# Patient Record
Sex: Female | Born: 1940
Health system: Southern US, Community
[De-identification: ages and names within clinical notes are randomized; demographics above are authoritative.]

## PROBLEM LIST (undated history)

## (undated) DIAGNOSIS — G5622 Lesion of ulnar nerve, left upper limb: Secondary | ICD-10-CM

## (undated) DIAGNOSIS — D649 Anemia, unspecified: Secondary | ICD-10-CM

## (undated) DIAGNOSIS — I509 Heart failure, unspecified: Secondary | ICD-10-CM

## (undated) DIAGNOSIS — D689 Coagulation defect, unspecified: Secondary | ICD-10-CM

## (undated) DIAGNOSIS — E039 Hypothyroidism, unspecified: Secondary | ICD-10-CM

## (undated) DIAGNOSIS — T7840XA Allergy, unspecified, initial encounter: Secondary | ICD-10-CM

## (undated) DIAGNOSIS — M810 Age-related osteoporosis without current pathological fracture: Secondary | ICD-10-CM

## (undated) DIAGNOSIS — I739 Peripheral vascular disease, unspecified: Secondary | ICD-10-CM

## (undated) DIAGNOSIS — I714 Abdominal aortic aneurysm, without rupture, unspecified: Secondary | ICD-10-CM

## (undated) DIAGNOSIS — E119 Type 2 diabetes mellitus without complications: Secondary | ICD-10-CM

## (undated) DIAGNOSIS — M5136 Other intervertebral disc degeneration, lumbar region: Secondary | ICD-10-CM

## (undated) DIAGNOSIS — N183 Chronic kidney disease, stage 3 unspecified: Secondary | ICD-10-CM

## (undated) DIAGNOSIS — J439 Emphysema, unspecified: Secondary | ICD-10-CM

## (undated) DIAGNOSIS — I219 Acute myocardial infarction, unspecified: Secondary | ICD-10-CM

## (undated) DIAGNOSIS — N6019 Diffuse cystic mastopathy of unspecified breast: Secondary | ICD-10-CM

## (undated) DIAGNOSIS — I10 Essential (primary) hypertension: Secondary | ICD-10-CM

## (undated) DIAGNOSIS — C9000 Multiple myeloma not having achieved remission: Secondary | ICD-10-CM

## (undated) DIAGNOSIS — M1990 Unspecified osteoarthritis, unspecified site: Secondary | ICD-10-CM

## (undated) DIAGNOSIS — E785 Hyperlipidemia, unspecified: Secondary | ICD-10-CM

## (undated) DIAGNOSIS — Z5189 Encounter for other specified aftercare: Secondary | ICD-10-CM

## (undated) DIAGNOSIS — D759 Disease of blood and blood-forming organs, unspecified: Secondary | ICD-10-CM

## (undated) DIAGNOSIS — E079 Disorder of thyroid, unspecified: Secondary | ICD-10-CM

## (undated) DIAGNOSIS — IMO0001 Reserved for inherently not codable concepts without codable children: Secondary | ICD-10-CM

## (undated) DIAGNOSIS — H269 Unspecified cataract: Secondary | ICD-10-CM

## (undated) DIAGNOSIS — N289 Disorder of kidney and ureter, unspecified: Secondary | ICD-10-CM

## (undated) DIAGNOSIS — J449 Chronic obstructive pulmonary disease, unspecified: Secondary | ICD-10-CM

## (undated) DIAGNOSIS — R12 Heartburn: Secondary | ICD-10-CM

## (undated) DIAGNOSIS — M5126 Other intervertebral disc displacement, lumbar region: Secondary | ICD-10-CM

## (undated) DIAGNOSIS — G459 Transient cerebral ischemic attack, unspecified: Secondary | ICD-10-CM

## (undated) DIAGNOSIS — I639 Cerebral infarction, unspecified: Secondary | ICD-10-CM

## (undated) DIAGNOSIS — I2510 Atherosclerotic heart disease of native coronary artery without angina pectoris: Secondary | ICD-10-CM

## (undated) DIAGNOSIS — I83899 Varicose veins of unspecified lower extremities with other complications: Secondary | ICD-10-CM

## (undated) DIAGNOSIS — M51369 Other intervertebral disc degeneration, lumbar region without mention of lumbar back pain or lower extremity pain: Secondary | ICD-10-CM

## (undated) HISTORY — PX: SPINE SURGERY: SHX786

## (undated) HISTORY — PX: JOINT REPLACEMENT: SHX530

## (undated) HISTORY — PX: OTHER SURGICAL HISTORY: SHX169

## (undated) HISTORY — DX: Age-related osteoporosis without current pathological fracture: M81.0

## (undated) HISTORY — DX: Coagulation defect, unspecified: D68.9

## (undated) HISTORY — DX: Other intervertebral disc degeneration, lumbar region without mention of lumbar back pain or lower extremity pain: M51.369

## (undated) HISTORY — PX: CATARACT EXTRACTION, BILATERAL: SHX1313

## (undated) HISTORY — DX: Encounter for other specified aftercare: Z51.89

## (undated) HISTORY — DX: Unspecified osteoarthritis, unspecified site: M19.90

## (undated) HISTORY — DX: Peripheral vascular disease, unspecified: I73.9

## (undated) HISTORY — DX: Disorder of thyroid, unspecified: E07.9

## (undated) HISTORY — DX: Multiple myeloma not having achieved remission: C90.00

## (undated) HISTORY — PX: ULNAR NERVE REPAIR: SHX2594

## (undated) HISTORY — DX: Unspecified cataract: H26.9

## (undated) HISTORY — DX: Transient cerebral ischemic attack, unspecified: G45.9

## (undated) HISTORY — DX: Emphysema, unspecified: J43.9

## (undated) HISTORY — PX: ENDOSCOPIC VEIN LASER TREATMENT: SHX1508

## (undated) HISTORY — DX: Heart failure, unspecified: I50.9

## (undated) HISTORY — DX: Hyperlipidemia, unspecified: E78.5

## (undated) HISTORY — PX: THYROID SURGERY: SHX805

## (undated) HISTORY — PX: TONSILLECTOMY: SUR1361

## (undated) HISTORY — DX: Other intervertebral disc displacement, lumbar region: M51.26

## (undated) HISTORY — DX: Essential (primary) hypertension: I10

## (undated) HISTORY — PX: BREAST EXCISIONAL BIOPSY: SUR124

## (undated) HISTORY — DX: Other intervertebral disc degeneration, lumbar region: M51.36

## (undated) HISTORY — DX: Diffuse cystic mastopathy of unspecified breast: N60.19

---
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
## 1973-04-25 HISTORY — PX: BREAST SURGERY: SHX581

## 1978-04-25 DIAGNOSIS — T7840XA Allergy, unspecified, initial encounter: Secondary | ICD-10-CM

## 1978-04-25 HISTORY — DX: Allergy, unspecified, initial encounter: T78.40XA

## 1988-04-25 HISTORY — PX: OOPHORECTOMY: SHX86

## 1988-04-25 HISTORY — PX: ABDOMINAL HYSTERECTOMY: SHX81

## 1996-04-25 HISTORY — PX: COLONOSCOPY: SHX174

## 2008-04-25 HISTORY — PX: OTHER SURGICAL HISTORY: SHX169

## 2009-04-25 HISTORY — PX: BACK SURGERY: SHX140

## 2009-07-26 ENCOUNTER — Ambulatory Visit: Payer: Self-pay | Admitting: Orthopedic Surgery

## 2009-10-06 ENCOUNTER — Inpatient Hospital Stay (HOSPITAL_COMMUNITY): Admission: RE | Admit: 2009-10-06 | Discharge: 2009-10-21 | Payer: Self-pay | Admitting: Neurosurgery

## 2009-11-25 ENCOUNTER — Encounter: Admission: RE | Admit: 2009-11-25 | Discharge: 2009-11-25 | Payer: Self-pay | Admitting: Neurosurgery

## 2010-01-06 ENCOUNTER — Encounter: Admission: RE | Admit: 2010-01-06 | Discharge: 2010-01-06 | Payer: Self-pay | Admitting: Neurosurgery

## 2010-04-25 HISTORY — PX: KNEE SURGERY: SHX244

## 2010-07-11 LAB — URINALYSIS, ROUTINE W REFLEX MICROSCOPIC
Bilirubin Urine: NEGATIVE
Glucose, UA: NEGATIVE mg/dL
Hgb urine dipstick: NEGATIVE
Ketones, ur: NEGATIVE mg/dL
Nitrite: NEGATIVE
Protein, ur: NEGATIVE mg/dL
Specific Gravity, Urine: 1.015 (ref 1.005–1.030)
Urobilinogen, UA: 0.2 mg/dL (ref 0.0–1.0)
pH: 6 (ref 5.0–8.0)

## 2010-07-11 LAB — BASIC METABOLIC PANEL
BUN: 12 mg/dL (ref 6–23)
BUN: 8 mg/dL (ref 6–23)
CO2: 28 mEq/L (ref 19–32)
CO2: 31 mEq/L (ref 19–32)
Calcium: 8.2 mg/dL — ABNORMAL LOW (ref 8.4–10.5)
Calcium: 8.7 mg/dL (ref 8.4–10.5)
Chloride: 100 mEq/L (ref 96–112)
Chloride: 97 mEq/L (ref 96–112)
Creatinine, Ser: 0.79 mg/dL (ref 0.4–1.2)
Creatinine, Ser: 0.85 mg/dL (ref 0.4–1.2)
GFR calc Af Amer: >60 mL/min (ref >60)
GFR calc Af Amer: >60 mL/min (ref >60)
GFR calc non Af Amer: >60 mL/min (ref >60)
GFR calc non Af Amer: >60 mL/min (ref >60)
Glucose, Bld: 112 mg/dL — ABNORMAL HIGH (ref 70–99)
Glucose, Bld: 121 mg/dL — ABNORMAL HIGH (ref 70–99)
Potassium: 3.1 mEq/L — ABNORMAL LOW (ref 3.5–5.1)
Potassium: 3.6 mEq/L (ref 3.5–5.1)
Sodium: 136 mEq/L (ref 135–145)
Sodium: 136 mEq/L (ref 135–145)

## 2010-07-11 LAB — URINE CULTURE
Colony Count: NO GROWTH
Culture: NO GROWTH

## 2010-07-11 LAB — VANCOMYCIN, TROUGH: Vancomycin Tr: 21.9 ug/mL — ABNORMAL HIGH (ref 10.0–20.0)

## 2010-07-12 LAB — TYPE AND SCREEN
ABO/RH(D): A POS
Antibody Screen: NEGATIVE

## 2010-07-12 LAB — CBC
HCT: 37.8 % (ref 36.0–46.0)
Hemoglobin: 12.9 g/dL (ref 12.0–15.0)
MCHC: 34.1 g/dL (ref 30.0–36.0)
MCV: 99.5 fL (ref 78.0–100.0)
Platelets: 308 10*3/uL (ref 150–400)
RBC: 3.8 MIL/uL — ABNORMAL LOW (ref 3.87–5.11)
RDW: 13.4 % (ref 11.5–15.5)
WBC: 10.9 10*3/uL — ABNORMAL HIGH (ref 4.0–10.5)

## 2010-07-12 LAB — ABO/RH: ABO/RH(D): A POS

## 2010-07-12 LAB — SURGICAL PCR SCREEN
MRSA, PCR: NEGATIVE
Staphylococcus aureus: NEGATIVE

## 2010-09-27 ENCOUNTER — Ambulatory Visit: Payer: Self-pay | Admitting: Orthopedic Surgery

## 2010-10-12 ENCOUNTER — Inpatient Hospital Stay: Payer: Self-pay | Admitting: Orthopedic Surgery

## 2010-10-18 ENCOUNTER — Encounter: Payer: Self-pay | Admitting: Internal Medicine

## 2010-10-24 ENCOUNTER — Encounter: Payer: Self-pay | Admitting: Internal Medicine

## 2010-11-24 ENCOUNTER — Encounter: Payer: Self-pay | Admitting: Internal Medicine

## 2010-11-30 ENCOUNTER — Encounter: Payer: Self-pay | Admitting: Orthopedic Surgery

## 2010-12-25 ENCOUNTER — Encounter: Payer: Self-pay | Admitting: Orthopedic Surgery

## 2011-05-02 DIAGNOSIS — E785 Hyperlipidemia, unspecified: Secondary | ICD-10-CM | POA: Diagnosis not present

## 2011-05-02 DIAGNOSIS — I10 Essential (primary) hypertension: Secondary | ICD-10-CM | POA: Diagnosis not present

## 2011-05-02 DIAGNOSIS — E039 Hypothyroidism, unspecified: Secondary | ICD-10-CM | POA: Diagnosis not present

## 2011-05-25 DIAGNOSIS — M1710 Unilateral primary osteoarthritis, unspecified knee: Secondary | ICD-10-CM | POA: Diagnosis not present

## 2011-08-01 DIAGNOSIS — E785 Hyperlipidemia, unspecified: Secondary | ICD-10-CM | POA: Diagnosis not present

## 2011-08-01 DIAGNOSIS — E039 Hypothyroidism, unspecified: Secondary | ICD-10-CM | POA: Diagnosis not present

## 2011-08-23 DIAGNOSIS — M1710 Unilateral primary osteoarthritis, unspecified knee: Secondary | ICD-10-CM | POA: Diagnosis not present

## 2011-09-26 DIAGNOSIS — Z0000 Encounter for general adult medical examination without abnormal findings: Secondary | ICD-10-CM | POA: Diagnosis not present

## 2011-09-26 DIAGNOSIS — Z1331 Encounter for screening for depression: Secondary | ICD-10-CM | POA: Diagnosis not present

## 2011-09-26 DIAGNOSIS — E785 Hyperlipidemia, unspecified: Secondary | ICD-10-CM | POA: Diagnosis not present

## 2011-09-26 DIAGNOSIS — Z1289 Encounter for screening for malignant neoplasm of other sites: Secondary | ICD-10-CM | POA: Diagnosis not present

## 2011-09-26 DIAGNOSIS — E039 Hypothyroidism, unspecified: Secondary | ICD-10-CM | POA: Diagnosis not present

## 2011-09-26 DIAGNOSIS — I10 Essential (primary) hypertension: Secondary | ICD-10-CM | POA: Diagnosis not present

## 2011-12-28 DIAGNOSIS — H2510 Age-related nuclear cataract, unspecified eye: Secondary | ICD-10-CM | POA: Diagnosis not present

## 2012-04-05 DIAGNOSIS — Z1231 Encounter for screening mammogram for malignant neoplasm of breast: Secondary | ICD-10-CM | POA: Diagnosis not present

## 2012-04-05 DIAGNOSIS — R922 Inconclusive mammogram: Secondary | ICD-10-CM | POA: Diagnosis not present

## 2012-04-11 DIAGNOSIS — I83893 Varicose veins of bilateral lower extremities with other complications: Secondary | ICD-10-CM | POA: Diagnosis not present

## 2012-04-11 DIAGNOSIS — N6019 Diffuse cystic mastopathy of unspecified breast: Secondary | ICD-10-CM | POA: Diagnosis not present

## 2012-06-21 DIAGNOSIS — H905 Unspecified sensorineural hearing loss: Secondary | ICD-10-CM | POA: Diagnosis not present

## 2012-09-19 DIAGNOSIS — M1710 Unilateral primary osteoarthritis, unspecified knee: Secondary | ICD-10-CM | POA: Diagnosis not present

## 2012-10-01 DIAGNOSIS — E785 Hyperlipidemia, unspecified: Secondary | ICD-10-CM | POA: Diagnosis not present

## 2012-10-01 DIAGNOSIS — E039 Hypothyroidism, unspecified: Secondary | ICD-10-CM | POA: Diagnosis not present

## 2012-10-01 DIAGNOSIS — Z0000 Encounter for general adult medical examination without abnormal findings: Secondary | ICD-10-CM | POA: Diagnosis not present

## 2012-10-01 DIAGNOSIS — I10 Essential (primary) hypertension: Secondary | ICD-10-CM | POA: Diagnosis not present

## 2012-10-17 ENCOUNTER — Encounter: Payer: Self-pay | Admitting: *Deleted

## 2013-01-02 DIAGNOSIS — H2510 Age-related nuclear cataract, unspecified eye: Secondary | ICD-10-CM | POA: Diagnosis not present

## 2013-04-09 DIAGNOSIS — R928 Other abnormal and inconclusive findings on diagnostic imaging of breast: Secondary | ICD-10-CM | POA: Diagnosis not present

## 2013-04-09 DIAGNOSIS — Z1231 Encounter for screening mammogram for malignant neoplasm of breast: Secondary | ICD-10-CM | POA: Diagnosis not present

## 2013-04-23 ENCOUNTER — Ambulatory Visit: Payer: Self-pay | Admitting: General Surgery

## 2013-04-29 DIAGNOSIS — S8010XA Contusion of unspecified lower leg, initial encounter: Secondary | ICD-10-CM | POA: Diagnosis not present

## 2013-05-01 ENCOUNTER — Encounter: Payer: Self-pay | Admitting: General Surgery

## 2013-05-01 ENCOUNTER — Ambulatory Visit (INDEPENDENT_AMBULATORY_CARE_PROVIDER_SITE_OTHER): Payer: Medicare Other | Admitting: General Surgery

## 2013-05-01 VITALS — BP 144/84 | HR 82 | Resp 14 | Ht 65.5 in | Wt 197.0 lb

## 2013-05-01 DIAGNOSIS — N6019 Diffuse cystic mastopathy of unspecified breast: Secondary | ICD-10-CM

## 2013-05-01 NOTE — Patient Instructions (Signed)
Follow up in one year with bilateral screening mammogram and office visit. Continue self breast exams. Call office for any new breast issues or concerns.

## 2013-05-01 NOTE — Progress Notes (Signed)
Patient ID: [name], female   DOB: Feb 12, 1941, 73 y.o.   MRN: [id number]  Chief Complaint  Patient presents with  . Follow-up    mammogram    HPI Bailey Ayala is a 73 y.o. female.  who presents for her annual follow up breast evaluation. The most recent mammogram was done on 04-09-13.  Patient does perform regular self breast checks and gets regular mammograms done.  No new breast complaints.    HPI  Past Medical History  Diagnosis Date  . Arthritis   . Diffuse cystic mastopathy   . Thyroid disease     Past Surgical History  Procedure Laterality Date  . Abdominal hysterectomy    . Tonsillectomy    . Vein closure procedure Left 2010  . Colonoscopy  1998  . Knee surgery Left 2012  . Back surgery  2011    Family History  Problem Relation Age of Onset  . Heart disease Mother     MI  . Heart disease Father     MI    Social History History  Substance Use Topics  . Smoking status: Former Smoker    Quit date: 04/25/1988  . Smokeless tobacco: Never Used  . Alcohol Use: Yes     Comment: occasionally    Allergies  Allergen Reactions  . Codeine     hallucinations      Current Outpatient Prescriptions  Medication Sig Dispense Refill  . ibuprofen (ADVIL,MOTRIN) 200 MG tablet Take 200 mg by mouth every 6 (six) hours as needed.      . Multiple Vitamins-Minerals (MULTI VITAMIN/MINERALS PO) Take by mouth daily.      Marland Kitchen SYNTHROID 112 MCG tablet Take 112 mcg by mouth daily before breakfast.        No current facility-administered medications for this visit.    Review of Systems Review of Systems  Constitutional: Negative.   Respiratory: Negative.   Cardiovascular: Negative.     Blood pressure 144/84, pulse 82, resp. rate 14, height 5' 5.5" (1.664 m), weight 197 lb (89.359 kg).  Physical Exam Physical Exam  Constitutional: She is oriented to person, place, and time. She appears well-developed and well-nourished.  Eyes: Conjunctivae are normal. No  scleral icterus.  Neck: Neck supple.  Cardiovascular: Normal rate, regular rhythm and normal heart sounds.   Pulmonary/Chest: Effort normal and breath sounds normal. Right breast exhibits no inverted nipple, no mass, no nipple discharge, no skin change and no tenderness. Left breast exhibits no inverted nipple, no mass, no nipple discharge, no skin change and no tenderness.  Lymphadenopathy:    She has no cervical adenopathy.    She has no axillary adenopathy.  Neurological: She is alert and oriented to person, place, and time.  Skin: Skin is warm and dry.    Data Reviewed Mammogram reviewed and stable.  Assessment    Stable physical exam.    Plan    Follow up in one year with bilateral screening mammogram and office visit.       Chukwudi Ewen G 05/01/2013, 2:27 PM

## 2013-10-28 DIAGNOSIS — Z0000 Encounter for general adult medical examination without abnormal findings: Secondary | ICD-10-CM | POA: Diagnosis not present

## 2013-10-28 DIAGNOSIS — I10 Essential (primary) hypertension: Secondary | ICD-10-CM | POA: Diagnosis not present

## 2013-10-28 DIAGNOSIS — E785 Hyperlipidemia, unspecified: Secondary | ICD-10-CM | POA: Diagnosis not present

## 2013-10-28 DIAGNOSIS — E039 Hypothyroidism, unspecified: Secondary | ICD-10-CM | POA: Diagnosis not present

## 2014-01-02 ENCOUNTER — Encounter: Payer: Self-pay | Admitting: General Surgery

## 2014-02-20 DIAGNOSIS — H2511 Age-related nuclear cataract, right eye: Secondary | ICD-10-CM | POA: Diagnosis not present

## 2014-02-24 ENCOUNTER — Encounter: Payer: Self-pay | Admitting: General Surgery

## 2014-04-25 DIAGNOSIS — G459 Transient cerebral ischemic attack, unspecified: Secondary | ICD-10-CM

## 2014-04-25 HISTORY — DX: Transient cerebral ischemic attack, unspecified: G45.9

## 2014-04-30 ENCOUNTER — Ambulatory Visit: Payer: Self-pay | Admitting: Ophthalmology

## 2014-04-30 DIAGNOSIS — I499 Cardiac arrhythmia, unspecified: Secondary | ICD-10-CM | POA: Diagnosis not present

## 2014-04-30 DIAGNOSIS — H2511 Age-related nuclear cataract, right eye: Secondary | ICD-10-CM | POA: Diagnosis not present

## 2014-04-30 DIAGNOSIS — Z01810 Encounter for preprocedural cardiovascular examination: Secondary | ICD-10-CM | POA: Diagnosis not present

## 2014-05-06 ENCOUNTER — Ambulatory Visit: Payer: Self-pay | Admitting: Ophthalmology

## 2014-05-06 DIAGNOSIS — R600 Localized edema: Secondary | ICD-10-CM | POA: Diagnosis not present

## 2014-05-06 DIAGNOSIS — H2511 Age-related nuclear cataract, right eye: Secondary | ICD-10-CM | POA: Diagnosis not present

## 2014-05-06 DIAGNOSIS — R002 Palpitations: Secondary | ICD-10-CM | POA: Diagnosis not present

## 2014-05-06 DIAGNOSIS — D649 Anemia, unspecified: Secondary | ICD-10-CM | POA: Diagnosis not present

## 2014-05-06 DIAGNOSIS — J449 Chronic obstructive pulmonary disease, unspecified: Secondary | ICD-10-CM | POA: Diagnosis not present

## 2014-05-06 DIAGNOSIS — M069 Rheumatoid arthritis, unspecified: Secondary | ICD-10-CM | POA: Diagnosis not present

## 2014-05-06 DIAGNOSIS — E039 Hypothyroidism, unspecified: Secondary | ICD-10-CM | POA: Diagnosis not present

## 2014-05-06 DIAGNOSIS — I10 Essential (primary) hypertension: Secondary | ICD-10-CM | POA: Diagnosis not present

## 2014-05-06 DIAGNOSIS — Z888 Allergy status to other drugs, medicaments and biological substances status: Secondary | ICD-10-CM | POA: Diagnosis not present

## 2014-05-06 HISTORY — PX: EYE SURGERY: SHX253

## 2014-05-13 ENCOUNTER — Ambulatory Visit: Payer: No Typology Code available for payment source | Admitting: General Surgery

## 2014-06-16 ENCOUNTER — Ambulatory Visit: Payer: Medicare Other | Admitting: General Surgery

## 2014-06-17 ENCOUNTER — Ambulatory Visit: Payer: Self-pay | Admitting: Ophthalmology

## 2014-06-17 DIAGNOSIS — Z9849 Cataract extraction status, unspecified eye: Secondary | ICD-10-CM | POA: Diagnosis not present

## 2014-06-17 DIAGNOSIS — H2512 Age-related nuclear cataract, left eye: Secondary | ICD-10-CM | POA: Diagnosis not present

## 2014-06-17 DIAGNOSIS — Z96652 Presence of left artificial knee joint: Secondary | ICD-10-CM | POA: Diagnosis not present

## 2014-06-17 DIAGNOSIS — M1990 Unspecified osteoarthritis, unspecified site: Secondary | ICD-10-CM | POA: Diagnosis not present

## 2014-06-17 DIAGNOSIS — E669 Obesity, unspecified: Secondary | ICD-10-CM | POA: Diagnosis not present

## 2014-06-17 DIAGNOSIS — I10 Essential (primary) hypertension: Secondary | ICD-10-CM | POA: Diagnosis not present

## 2014-06-17 DIAGNOSIS — H269 Unspecified cataract: Secondary | ICD-10-CM | POA: Diagnosis not present

## 2014-06-17 DIAGNOSIS — Z9889 Other specified postprocedural states: Secondary | ICD-10-CM | POA: Diagnosis not present

## 2014-06-17 DIAGNOSIS — Z885 Allergy status to narcotic agent status: Secondary | ICD-10-CM | POA: Diagnosis not present

## 2014-06-17 DIAGNOSIS — Z79899 Other long term (current) drug therapy: Secondary | ICD-10-CM | POA: Diagnosis not present

## 2014-06-17 DIAGNOSIS — Z791 Long term (current) use of non-steroidal anti-inflammatories (NSAID): Secondary | ICD-10-CM | POA: Diagnosis not present

## 2014-07-09 ENCOUNTER — Encounter: Payer: Self-pay | Admitting: General Surgery

## 2014-07-09 DIAGNOSIS — Z1231 Encounter for screening mammogram for malignant neoplasm of breast: Secondary | ICD-10-CM | POA: Diagnosis not present

## 2014-07-16 ENCOUNTER — Ambulatory Visit (INDEPENDENT_AMBULATORY_CARE_PROVIDER_SITE_OTHER): Payer: Medicare Other | Admitting: General Surgery

## 2014-07-16 ENCOUNTER — Ambulatory Visit: Payer: Medicare Other | Admitting: General Surgery

## 2014-07-16 ENCOUNTER — Encounter: Payer: Self-pay | Admitting: General Surgery

## 2014-07-16 VITALS — BP 124/76 | HR 74 | Resp 14 | Ht 65.0 in | Wt 197.0 lb

## 2014-07-16 DIAGNOSIS — N6019 Diffuse cystic mastopathy of unspecified breast: Secondary | ICD-10-CM

## 2014-07-16 DIAGNOSIS — I872 Venous insufficiency (chronic) (peripheral): Secondary | ICD-10-CM

## 2014-07-16 NOTE — Progress Notes (Signed)
Patient ID: Bailey Ayala, female   DOB: 31-Jul-1940, 74 y.o.   MRN: [id number]  Chief Complaint  Patient presents with  . Follow-up    mammogram    HPI Bailey Ayala is a 74 y.o. female who presents for a breast evaluation. The most recent mammogram was done on 07/09/14 .  Patient also states that she has been having pain in her legs, mostly the right leg.  It is worse in evening with heaviness and cramping. She had prior ablation done on left leg Patient does perform regular self breast checks and gets regular mammograms done.    HPI  Past Medical History  Diagnosis Date  . Arthritis   . Diffuse cystic mastopathy   . Thyroid disease     Past Surgical History  Procedure Laterality Date  . Abdominal hysterectomy    . Tonsillectomy    . Vein closure procedure Left 2010  . Colonoscopy  1998  . Knee surgery Left 2012  . Back surgery  2011  . Eye surgery Bilateral 05/06/14    Family History  Problem Relation Age of Onset  . Heart disease Mother     MI  . Heart disease Father     MI    Social History History  Substance Use Topics  . Smoking status: Former Smoker    Quit date: 04/25/1988  . Smokeless tobacco: Never Used  . Alcohol Use: Yes     Comment: occasionally    Allergies  Allergen Reactions  . Codeine     hallucinations      Current Outpatient Prescriptions  Medication Sig Dispense Refill  . ibuprofen (ADVIL,MOTRIN) 200 MG tablet Take 200 mg by mouth every 6 (six) hours as needed.    . Multiple Vitamins-Minerals (MULTI VITAMIN/MINERALS PO) Take by mouth daily.    Marland Kitchen SYNTHROID 112 MCG tablet Take 112 mcg by mouth daily before breakfast.      No current facility-administered medications for this visit.    Review of Systems Review of Systems  Constitutional: Negative.   Respiratory: Negative.   Cardiovascular: Negative.     Blood pressure 124/76, pulse 74, resp. rate 14, height 5\' 5"  (1.651 m), weight 197 lb (89.359 kg).  Physical  Exam Physical Exam  Constitutional: She appears well-developed and well-nourished.  Eyes: Conjunctivae are normal. No scleral icterus.  Neck: Neck supple.  Cardiovascular: Normal rate, regular rhythm and normal heart sounds.   Pulses:      Dorsalis pedis pulses are 1+ on the right side, and 1+ on the left side.       Posterior tibial pulses are 0 on the right side, and 0 on the left side.  1+ edema on both legs. Feet are warm. Capillary refill is brisk.  Pulmonary/Chest: Effort normal and breath sounds normal. Right breast exhibits no inverted nipple, no mass, no nipple discharge, no skin change and no tenderness. Left breast exhibits no inverted nipple, no mass, no nipple discharge, no skin change and no tenderness.  Abdominal: Soft. Normal appearance and bowel sounds are normal. There is no tenderness.  Lymphadenopathy:    She has no cervical adenopathy.    She has no axillary adenopathy.  Skin: Skin is warm and dry.    Data Reviewed Mammogram reviewed-stable.  Assessment    Fibrocystic breast disease. Venous insufficiency.    Plan    The patient has been asked to return to the office in one year with a bilateral screening mammogram. Patient will return for bilateral  duplex ultrasound of the legs and ABI. Patient was advised to wear the stockings in the mean time.       Mahkai Fangman G 07/16/2014, 11:21 AM

## 2014-07-22 ENCOUNTER — Ambulatory Visit: Payer: Self-pay | Admitting: Ophthalmology

## 2014-07-22 DIAGNOSIS — H5711 Ocular pain, right eye: Secondary | ICD-10-CM | POA: Diagnosis not present

## 2014-07-22 DIAGNOSIS — R930 Abnormal findings on diagnostic imaging of skull and head, not elsewhere classified: Secondary | ICD-10-CM | POA: Diagnosis not present

## 2014-07-22 DIAGNOSIS — I868 Varicose veins of other specified sites: Secondary | ICD-10-CM | POA: Diagnosis not present

## 2014-07-23 DIAGNOSIS — Z96652 Presence of left artificial knee joint: Secondary | ICD-10-CM | POA: Diagnosis not present

## 2014-07-23 DIAGNOSIS — M1711 Unilateral primary osteoarthritis, right knee: Secondary | ICD-10-CM | POA: Diagnosis not present

## 2014-07-30 ENCOUNTER — Ambulatory Visit (INDEPENDENT_AMBULATORY_CARE_PROVIDER_SITE_OTHER): Payer: Medicare Other | Admitting: General Surgery

## 2014-07-30 ENCOUNTER — Ambulatory Visit: Payer: No Typology Code available for payment source

## 2014-07-30 ENCOUNTER — Encounter: Payer: Self-pay | Admitting: General Surgery

## 2014-07-30 VITALS — BP 168/86 | HR 80 | Resp 14 | Ht 65.0 in | Wt 198.0 lb

## 2014-07-30 DIAGNOSIS — I872 Venous insufficiency (chronic) (peripheral): Secondary | ICD-10-CM

## 2014-07-30 NOTE — Progress Notes (Signed)
This is a 74 year old female here today for ABI's and right leg ultrasound. ABI shows monophasic flow in right DP/PT and sluggish flow in left foot. Index is 0.97 on right and 0.52 on left. Inspite of the low index on left pt describes no left leg symptoms.  Duplex study right leg  Venous- deep syste with abnormality. Lateral branch of GSV is abnormal with reflux of over 0.38secs. VV seem to connect with this vein  Pt advised on findings. Her main symptom appears tpo be increasing tired feeling in right leg as day progresses. Trial of compression hose for 2 mos. Reassess then. If not improved will discuss ablation of the lateral GSV

## 2014-08-24 NOTE — Op Note (Signed)
PATIENT NAME:  Bailey Ayala, Bailey Ayala MR#:  [id number] DATE OF BIRTH:  January 24, 1941  DATE OF PROCEDURE:  06/17/2014  PREOPERATIVE DIAGNOSIS:  Nuclear sclerotic cataract of the left eye.   POSTOPERATIVE DIAGNOSIS:  Nuclear sclerotic cataract of the left eye.   OPERATIVE PROCEDURE:  Cataract extraction by phacoemulsification with implant of intraocular lens to left eye.   SURGEON:  Birder Robson, MD.   ANESTHESIA:  1. Managed anesthesia care.  2. Topical tetracaine drops followed by 2% Xylocaine jelly applied in the preoperative holding area.   COMPLICATIONS:  None.   TECHNIQUE:   Stop and chop.  DESCRIPTION OF PROCEDURE:  The patient was examined and consented in the preoperative holding area where the aforementioned topical anesthesia was applied to the left eye and then brought back to the Operating Room where the left eye was prepped and draped in the usual sterile ophthalmic fashion and a lid speculum was placed. A paracentesis was created with the side port blade and the anterior chamber was filled with viscoelastic. A near clear corneal incision was performed with the steel keratome. A continuous curvilinear capsulorrhexis was performed with a cystotome followed by the capsulorrhexis forceps. Hydrodissection and hydrodelineation were carried out with BSS on a blunt cannula. The lens was removed in a stop and chop technique and the remaining cortical material was removed with the irrigation-aspiration handpiece. The capsular bag was inflated with viscoelastic and the Alcon SN60WF Tecnis ZCB00 22.0-diopter lens, serial number [id number] was placed in the capsular bag without complication. The remaining viscoelastic was removed from the eye with the irrigation-aspiration handpiece. The wounds were hydrated. The anterior chamber was flushed with Miostat and the eye was inflated to physiologic pressure. 0.1 mL of cefuroxime concentration 10 mg/mL was placed in the anterior chamber. The wounds were  found to be water tight. The eye was dressed with Vigamox. The patient was given protective glasses to wear throughout the day and a shield with which to sleep tonight. The patient was also given drops with which to begin a drop regimen today and will follow-up with me in one day.    ____________________________ Mcnerney Diones. Avagrace Botelho, MD wlp:mw D: 06/17/2014 20:08:09 ET T: 06/18/2014 05:23:09 ET JOB#: [id number]  cc: Raquell Richer L. Pauleen Goleman, MD, <Dictator> Sannes Diones Talulah Schirmer MD ELECTRONICALLY SIGNED 06/19/2014 10:31

## 2014-08-24 NOTE — Op Note (Signed)
PATIENT NAME:  Bailey Ayala, Bailey Ayala MR#:  [id number] DATE OF BIRTH:  Feb 09, 1941  DATE OF PROCEDURE:  05/06/2014  PREOPERATIVE DIAGNOSIS:  Nuclear sclerotic cataract of the right eye.   POSTOPERATIVE DIAGNOSIS:  Nuclear sclerotic cataract of the right eye.   OPERATIVE PROCEDURE:  Cataract extraction by phacoemulsification with implant of intraocular lens to right eye.   SURGEON:  Birder Robson, MD   ANESTHESIA:  1. Managed anesthesia care.  2. Topical tetracaine drops followed by 2% Xylocaine jelly applied in the preoperative holding area.   COMPLICATIONS:  None.   TECHNIQUE:   Stop and chop  DESCRIPTION OF PROCEDURE:  The patient was examined and consented in the preoperative holding area where the aforementioned topical anesthesia was applied to the right eye and then brought back to the operating room where the right eye was prepped and draped in the usual sterile ophthalmic fashion and a lid speculum was placed. A paracentesis was created with the side port blade and the anterior chamber was filled with viscoelastic. A near clear corneal incision was performed with the steel keratome. A continuous curvilinear capsulorrhexis was performed with a cystotome followed by the capsulorrhexis forceps. Hydrodissection and hydrodelineation were carried out with BSS on a blunt cannula. The lens was removed in a stop and chop technique and the remaining cortical material was removed with the irrigation-aspiration handpiece. The capsular bag was inflated with viscoelastic and the Tecnis ZCB00 22.0-diopter lens, serial number [id number] was placed in the capsular bag without complication. The remaining viscoelastic was removed from the eye with the irrigation-aspiration handpiece. The wounds were hydrated. The anterior chamber was flushed with Miostat and the eye was inflated to physiologic pressure. 0.1 mL of cefuroxime concentration 10 mg/mL was placed in the anterior chamber. The wounds were found to be  water tight. The eye was dressed with Vigamox. The patient was given protective glasses to wear throughout the day and a shield with which to sleep tonight. The patient was also given drops with which to begin a drop regimen today and will follow-up with me in one day.    ____________________________ Husk Diones. Harshitha Fretz, MD wlp:bm D: 05/06/2014 21:05:35 ET T: 05/07/2014 03:58:55 ET JOB#: [id number]  cc: Brynlei Klausner L. Marquet Faircloth, MD, <Dictator> Adelstein Diones Karsyn Jamie MD ELECTRONICALLY SIGNED 05/07/2014 11:19

## 2014-09-15 ENCOUNTER — Encounter: Payer: Self-pay | Admitting: General Surgery

## 2014-09-30 ENCOUNTER — Encounter: Payer: Self-pay | Admitting: General Surgery

## 2014-09-30 ENCOUNTER — Ambulatory Visit (INDEPENDENT_AMBULATORY_CARE_PROVIDER_SITE_OTHER): Payer: Medicare Other | Admitting: General Surgery

## 2014-09-30 VITALS — BP 140/72 | HR 78 | Resp 13 | Ht 65.0 in | Wt 198.0 lb

## 2014-09-30 DIAGNOSIS — I83013 Varicose veins of right lower extremity with ulcer of ankle: Secondary | ICD-10-CM

## 2014-09-30 DIAGNOSIS — I83012 Varicose veins of right lower extremity with ulcer of calf: Secondary | ICD-10-CM

## 2014-09-30 DIAGNOSIS — I83011 Varicose veins of right lower extremity with ulcer of thigh: Secondary | ICD-10-CM | POA: Diagnosis not present

## 2014-09-30 DIAGNOSIS — I83019 Varicose veins of right lower extremity with ulcer of unspecified site: Secondary | ICD-10-CM

## 2014-09-30 DIAGNOSIS — I83015 Varicose veins of right lower extremity with ulcer other part of foot: Secondary | ICD-10-CM

## 2014-09-30 DIAGNOSIS — I8390 Asymptomatic varicose veins of unspecified lower extremity: Secondary | ICD-10-CM

## 2014-09-30 DIAGNOSIS — I83014 Varicose veins of right lower extremity with ulcer of heel and midfoot: Secondary | ICD-10-CM

## 2014-09-30 DIAGNOSIS — L97919 Non-pressure chronic ulcer of unspecified part of right lower leg with unspecified severity: Secondary | ICD-10-CM

## 2014-09-30 DIAGNOSIS — I779 Disorder of arteries and arterioles, unspecified: Secondary | ICD-10-CM | POA: Diagnosis not present

## 2014-09-30 DIAGNOSIS — I8393 Asymptomatic varicose veins of bilateral lower extremities: Secondary | ICD-10-CM

## 2014-09-30 DIAGNOSIS — I83018 Varicose veins of right lower extremity with ulcer other part of lower leg: Secondary | ICD-10-CM

## 2014-09-30 NOTE — Progress Notes (Signed)
Patient ID: Bailey Ayala, female   DOB: [birth year], 74 y.o.   MRN: [id number]  Patient here for a 2 month follow up Varicose veins and PAD. States she is doing much better, does report minimal swelling by the end of day. Patient does not wear compression hose states hard to get on.  States that her symptoms are minimal and not incapacitating at this time. On the right leg, she experiences progressive discomfort through the day. On the left leg, she has claudication around a quarter of a mile on level ground and shorter distance going up hill.   Exam: Scant edema in both legs. Varicose veins on the right, as noted before.  Peripheral pulses are hard to feel bilaterally. Feet are warm and capillary refill is <2 seconds.   Impression: varicose vein on the right leg, bilateral arterial occlusive disease-left worse than right.   Symptoms are mild at this time, but this needs frequent follow up. Encouraged use of compression hose on right leg.   Follow up: 2 months with ABI

## 2014-11-03 DIAGNOSIS — E785 Hyperlipidemia, unspecified: Secondary | ICD-10-CM | POA: Insufficient documentation

## 2014-11-03 DIAGNOSIS — I10 Essential (primary) hypertension: Secondary | ICD-10-CM | POA: Insufficient documentation

## 2014-11-04 ENCOUNTER — Other Ambulatory Visit: Payer: Self-pay

## 2014-11-04 ENCOUNTER — Ambulatory Visit (INDEPENDENT_AMBULATORY_CARE_PROVIDER_SITE_OTHER): Payer: Medicare Other | Admitting: Family Medicine

## 2014-11-04 ENCOUNTER — Encounter: Payer: Self-pay | Admitting: Family Medicine

## 2014-11-04 ENCOUNTER — Other Ambulatory Visit: Payer: Medicare Other

## 2014-11-04 VITALS — BP 134/78 | HR 73 | Temp 97.8°F | Ht 64.8 in | Wt 195.0 lb

## 2014-11-04 DIAGNOSIS — E785 Hyperlipidemia, unspecified: Secondary | ICD-10-CM

## 2014-11-04 DIAGNOSIS — N182 Chronic kidney disease, stage 2 (mild): Secondary | ICD-10-CM | POA: Diagnosis not present

## 2014-11-04 DIAGNOSIS — E039 Hypothyroidism, unspecified: Secondary | ICD-10-CM

## 2014-11-04 DIAGNOSIS — I779 Disorder of arteries and arterioles, unspecified: Secondary | ICD-10-CM | POA: Diagnosis not present

## 2014-11-04 DIAGNOSIS — I10 Essential (primary) hypertension: Secondary | ICD-10-CM

## 2014-11-04 DIAGNOSIS — Z0000 Encounter for general adult medical examination without abnormal findings: Secondary | ICD-10-CM | POA: Diagnosis not present

## 2014-11-04 LAB — CBC WITH DIFFERENTIAL/PLATELET
Hematocrit: 38.4 % (ref 34.0–46.6)
Hemoglobin: 12.6 g/dL (ref 11.1–15.9)
Lymphocytes Absolute: 1.6 10*3/uL (ref 0.7–3.1)
Lymphs: 28 %
MCH: 33.5 pg — ABNORMAL HIGH (ref 26.6–33.0)
MCHC: 32.8 g/dL (ref 31.5–35.7)
MCV: 102 fL — ABNORMAL HIGH (ref 79–97)
MID (Absolute): 0.8 10*3/uL (ref 0.1–1.6)
MID: 14 %
Neutrophils Absolute: 3.5 10*3/uL (ref 1.4–7.0)
Neutrophils: 58 %
Platelets: 179 10*3/uL (ref 150–379)
RBC: 3.76 x10E6/uL — ABNORMAL LOW (ref 3.77–5.28)
RDW: 13.6 % (ref 12.3–15.4)
WBC: 5.9 10*3/uL (ref 3.4–10.8)

## 2014-11-04 LAB — URINALYSIS, ROUTINE W REFLEX MICROSCOPIC
Bilirubin, UA: NEGATIVE
Glucose, UA: NEGATIVE
Ketones, UA: NEGATIVE
Leukocytes, UA: NEGATIVE
Nitrite, UA: NEGATIVE
Protein, UA: NEGATIVE
RBC, UA: NEGATIVE
Specific Gravity, UA: 1.02 (ref 1.005–1.030)
Urobilinogen, Ur: 0.2 mg/dL (ref 0.2–1.0)
pH, UA: 5.5 (ref 5.0–7.5)

## 2014-11-04 LAB — LIPID PANEL PICCOLO, WAIVED
Chol/HDL Ratio Piccolo,Waive: 4.8 mg/dL
Cholesterol Piccolo, Waived: 221 mg/dL — ABNORMAL HIGH (ref <200)
HDL Chol Piccolo, Waived: 46 mg/dL — ABNORMAL LOW (ref >59)
LDL Chol Calc Piccolo Waived: 124 mg/dL — ABNORMAL HIGH (ref <100)
Triglycerides Piccolo,Waived: 254 mg/dL — ABNORMAL HIGH (ref <150)
VLDL Chol Calc Piccolo,Waive: 51 mg/dL — ABNORMAL HIGH (ref <30)

## 2014-11-04 MED ORDER — SYNTHROID 112 MCG PO TABS: 112.0000 ug | ORAL_TABLET | Freq: Every day | ORAL | Status: DC

## 2014-11-04 MED ORDER — ATORVASTATIN CALCIUM 40 MG PO TABS
40.0000 mg | ORAL_TABLET | Freq: Every day | ORAL | Status: DC
Start: 2014-11-04 — End: 2014-11-05

## 2014-11-04 NOTE — Assessment & Plan Note (Signed)
Followed by vascular Handicap tag

## 2014-11-04 NOTE — Assessment & Plan Note (Addendum)
Better but too high with PAD,  Pt reluctant but will start meds due to PAD

## 2014-11-04 NOTE — Progress Notes (Signed)
BP 134/78 mmHg  Pulse 73  Temp(Src) 97.8 F (36.6 C)  Ht 5' 4.8" (1.646 m)  Wt 195 lb (88.451 kg)  BMI 32.65 kg/m2  SpO2 95%   Subjective:    Patient ID: Bailey Ayala, female    DOB: May 04, 1940, 74 y.o.   MRN: [id number]  HPI: Bailey Ayala is a 74 y.o. female  Chief Complaint  Patient presents with  . Annual Exam  thyroid doing well no sx PAD stable limited ability to walk needs handy cap parking  Relevant past medical, surgical, family and social history reviewed and updated as indicated. Interim medical history since our last visit reviewed. Allergies and medications reviewed and updated.  Review of Systems  Constitutional: Negative.   HENT: Negative.   Eyes: Negative.   Respiratory: Negative.   Cardiovascular: Negative.   Gastrointestinal: Negative.   Endocrine: Negative.   Genitourinary: Negative.   Musculoskeletal: Negative.   Skin: Negative.   Allergic/Immunologic: Negative.   Neurological: Negative.   Hematological: Negative.   Psychiatric/Behavioral: Negative.     Per HPI unless specifically indicated above     Objective:    BP 134/78 mmHg  Pulse 73  Temp(Src) 97.8 F (36.6 C)  Ht 5' 4.8" (1.646 m)  Wt 195 lb (88.451 kg)  BMI 32.65 kg/m2  SpO2 95%  Wt Readings from Last 3 Encounters:  11/04/14 195 lb (88.451 kg)  10/28/13 196 lb (88.905 kg)  09/30/14 198 lb (89.812 kg)    Physical Exam  Constitutional: She is oriented to person, place, and time. She appears well-developed and well-nourished.  HENT:  Head: Normocephalic and atraumatic.  Right Ear: External ear normal.  Left Ear: External ear normal.  Nose: Nose normal.  Mouth/Throat: Oropharynx is clear and moist.  Eyes: Conjunctivae and EOM are normal. Pupils are equal, round, and reactive to light.  Neck: Normal range of motion. Neck supple. Carotid bruit is not present.  Cardiovascular: Normal rate, regular rhythm and normal heart sounds.   No murmur heard. Pulmonary/Chest:  Effort normal and breath sounds normal. Right breast exhibits no mass and no tenderness. Left breast exhibits no mass and no tenderness. Breasts are symmetrical.  Abdominal: Soft. Bowel sounds are normal. There is no hepatosplenomegaly.  Musculoskeletal: Normal range of motion.       Right ankle: She exhibits abnormal pulse.       Left ankle: She exhibits abnormal pulse.  Neurological: She is alert and oriented to person, place, and time.  Skin: No rash noted.  Psychiatric: She has a normal mood and affect. Her behavior is normal. Judgment and thought content normal.        Assessment & Plan:   Problem List Items Addressed This Visit      Cardiovascular and Mediastinum   Peripheral arterial occlusive disease - Primary    Followed by vascular Handicap tag       Relevant Medications   atorvastatin (LIPITOR) 40 MG tablet     Endocrine   Hypothyroid   Relevant Medications   SYNTHROID 112 MCG tablet     Other   Hyperlipidemia    Better but too high with PAD,  Pt reluctant but will start meds due to PAD      Relevant Medications   atorvastatin (LIPITOR) 40 MG tablet    Other Visit Diagnoses    PE (physical exam), annual            Follow up plan: Return in about 2 months (around 01/05/2015), or  if symptoms worsen or fail to improve, for lipid check lipids alt, ast.

## 2014-11-05 ENCOUNTER — Other Ambulatory Visit: Payer: Self-pay | Admitting: Family Medicine

## 2014-11-05 ENCOUNTER — Telehealth: Payer: Self-pay | Admitting: Family Medicine

## 2014-11-05 LAB — COMPREHENSIVE METABOLIC PANEL
ALT: 14 IU/L (ref 0–32)
AST: 19 IU/L (ref 0–40)
Albumin/Globulin Ratio: 1.7 (ref 1.1–2.5)
Albumin: 4.2 g/dL (ref 3.5–4.8)
Alkaline Phosphatase: 85 IU/L (ref 39–117)
BUN/Creatinine Ratio: 21 (ref 11–26)
BUN: 26 mg/dL (ref 8–27)
Bilirubin Total: 0.3 mg/dL (ref 0.0–1.2)
CO2: 22 mmol/L (ref 18–29)
Calcium: 9 mg/dL (ref 8.7–10.3)
Chloride: 101 mmol/L (ref 97–108)
Creatinine, Ser: 1.21 mg/dL — ABNORMAL HIGH (ref 0.57–1.00)
GFR calc Af Amer: 51 mL/min/{1.73_m2} — ABNORMAL LOW (ref >59)
GFR calc non Af Amer: 44 mL/min/{1.73_m2} — ABNORMAL LOW (ref >59)
Globulin, Total: 2.5 g/dL (ref 1.5–4.5)
Glucose: 97 mg/dL (ref 65–99)
Potassium: 4.7 mmol/L (ref 3.5–5.2)
Sodium: 138 mmol/L (ref 134–144)
Total Protein: 6.7 g/dL (ref 6.0–8.5)

## 2014-11-05 LAB — TSH: TSH: 1.2 u[IU]/mL (ref 0.450–4.500)

## 2014-11-05 NOTE — Telephone Encounter (Signed)
Pt called in but wouldn't tell me anything. She was very polite and stated that she would appreciate it if you could give her a call back when you got a chance at the number provided.

## 2014-11-05 NOTE — Telephone Encounter (Signed)
Phone call  Pt not going to take chol meds due to concerns Discussed risk  Also discussed CKD and declining renal function  Pt taking a lot of motrin Will stop  Check BMP next OV

## 2014-12-01 ENCOUNTER — Ambulatory Visit: Payer: Medicare Other | Admitting: General Surgery

## 2014-12-08 ENCOUNTER — Ambulatory Visit (INDEPENDENT_AMBULATORY_CARE_PROVIDER_SITE_OTHER): Payer: Medicare Other | Admitting: General Surgery

## 2014-12-08 ENCOUNTER — Encounter: Payer: Self-pay | Admitting: General Surgery

## 2014-12-08 VITALS — BP 140/88 | HR 86 | Resp 14 | Ht 65.0 in | Wt 197.0 lb

## 2014-12-08 DIAGNOSIS — I8393 Asymptomatic varicose veins of bilateral lower extremities: Secondary | ICD-10-CM

## 2014-12-08 DIAGNOSIS — I8390 Asymptomatic varicose veins of unspecified lower extremity: Secondary | ICD-10-CM

## 2014-12-08 DIAGNOSIS — I779 Disorder of arteries and arterioles, unspecified: Secondary | ICD-10-CM

## 2014-12-08 NOTE — Progress Notes (Signed)
Patient ID: Bailey Ayala, female   DOB: 04-Jun-1940, 74 y.o.   MRN: [id number] Patient here for follow up for varicose veins. She is here for ABI's. She states that her legs are not any worse. Cramping in right leg has improved She has been trying to keep her legs elevated. 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 Being followed by her PCP She says she hurts in left leg after walking half to one block. Thius has not changed in a while. Feet are warm. Cap refill <2secs. Hard to fel pedal pulses. Doppler flow appears more normal -biphasic-on right. Monophasic flow on left Left ABI: 0.64 Right ABI: 1.05 These indices are stable. Follow up in six months.    PCP: Dr Golden Pop

## 2014-12-08 NOTE — Patient Instructions (Addendum)
Follow up in six months. Call the office if symptoms worsen.

## 2015-01-07 ENCOUNTER — Encounter: Payer: Self-pay | Admitting: General Surgery

## 2015-01-08 DIAGNOSIS — Z961 Presence of intraocular lens: Secondary | ICD-10-CM | POA: Diagnosis not present

## 2015-01-13 ENCOUNTER — Encounter: Payer: Self-pay | Admitting: Family Medicine

## 2015-01-13 ENCOUNTER — Ambulatory Visit (INDEPENDENT_AMBULATORY_CARE_PROVIDER_SITE_OTHER): Payer: Medicare Other | Admitting: Family Medicine

## 2015-01-13 VITALS — BP 148/84 | HR 72 | Temp 98.0°F | Ht 64.3 in | Wt 193.0 lb

## 2015-01-13 DIAGNOSIS — I779 Disorder of arteries and arterioles, unspecified: Secondary | ICD-10-CM

## 2015-01-13 DIAGNOSIS — Z23 Encounter for immunization: Secondary | ICD-10-CM | POA: Diagnosis not present

## 2015-01-13 DIAGNOSIS — I10 Essential (primary) hypertension: Secondary | ICD-10-CM | POA: Diagnosis not present

## 2015-01-13 DIAGNOSIS — E785 Hyperlipidemia, unspecified: Secondary | ICD-10-CM | POA: Diagnosis not present

## 2015-01-13 LAB — LP+ALT+AST PICCOLO, WAIVED
ALT (SGPT) Piccolo, Waived: 20 U/L (ref 10–47)
AST (SGOT) Piccolo, Waived: 26 U/L (ref 11–38)
Chol/HDL Ratio Piccolo,Waive: 4.7 mg/dL
Cholesterol Piccolo, Waived: 211 mg/dL — ABNORMAL HIGH (ref <200)
HDL Chol Piccolo, Waived: 45 mg/dL — ABNORMAL LOW (ref >59)
LDL Chol Calc Piccolo Waived: 108 mg/dL — ABNORMAL HIGH (ref <100)
Triglycerides Piccolo,Waived: 290 mg/dL — ABNORMAL HIGH (ref <150)
VLDL Chol Calc Piccolo,Waive: 58 mg/dL — ABNORMAL HIGH (ref <30)

## 2015-01-13 NOTE — Assessment & Plan Note (Signed)
Remains elevated with a goal LDL of less than 70 discussed risk benefits of medication and nontreatment. Patient adamant about not starting treatment will do dietary therapy which was discussed.

## 2015-01-13 NOTE — Assessment & Plan Note (Signed)
Currently episodically elevated will observe blood pressure and if remains elevated will start therapy

## 2015-01-13 NOTE — Progress Notes (Signed)
BP 148/84 mmHg  Pulse 72  Temp(Src) 98 F (36.7 C)  Ht 5' 4.3" (1.633 m)  Wt 193 lb (87.544 kg)  BMI 32.83 kg/m2  SpO2 98%   Subjective:    Patient ID: Bailey Ayala, female    DOB: [birth year], 74 y.o.   MRN: [id number]  HPI: Bailey Ayala is a 74 y.o. female  Chief Complaint  Patient presents with  . Hyperlipidemia   patient follow-up PAD and hyper cholesterol Elected not to take any medications due to concerns about side effects. Patient states she is on a near vegetarian diet and LDL has responded modestly. Reviewed Dr. Angie Fava notes about PAD and patient has remained stable with six-month follow-up for retesting for PAD. Patient continues to be a nonsmoker Patient's blood pressure elevated today but on chart review blood pressures mostly normal. Relevant past medical, surgical, family and social history reviewed and updated as indicated. Interim medical history since our last visit reviewed. Allergies and medications reviewed and updated.  Review of Systems  Constitutional: Negative.   Respiratory: Negative.   Cardiovascular: Negative.     Per HPI unless specifically indicated above     Objective:    BP 148/84 mmHg  Pulse 72  Temp(Src) 98 F (36.7 C)  Ht 5' 4.3" (1.633 m)  Wt 193 lb (87.544 kg)  BMI 32.83 kg/m2  SpO2 98%  Wt Readings from Last 3 Encounters:  01/13/15 193 lb (87.544 kg)  12/08/14 197 lb (89.359 kg)  11/04/14 195 lb (88.451 kg)    Physical Exam  Constitutional: She is oriented to person, place, and time. She appears well-developed and well-nourished. No distress.  HENT:  Head: Normocephalic and atraumatic.  Right Ear: Hearing normal.  Left Ear: Hearing normal.  Nose: Nose normal.  Eyes: Conjunctivae and lids are normal. Right eye exhibits no discharge. Left eye exhibits no discharge. No scleral icterus.  Cardiovascular: Normal rate, regular rhythm and normal heart sounds.   Pulmonary/Chest: Effort normal and breath sounds  normal. No respiratory distress.  Musculoskeletal: Normal range of motion.  Neurological: She is alert and oriented to person, place, and time.  Skin: Skin is intact. No rash noted.  Psychiatric: She has a normal mood and affect. Her speech is normal and behavior is normal. Judgment and thought content normal. Cognition and memory are normal.    Results for orders placed or performed in visit on 11/04/14  Comprehensive metabolic panel  Result Value Ref Range   Glucose 97 65 - 99 mg/dL   BUN 26 8 - 27 mg/dL   Creatinine, Ser 1.21 (H) 0.57 - 1.00 mg/dL   GFR calc non Af Amer 44 (L) >59 mL/min/1.73   GFR calc Af Amer 51 (L) >59 mL/min/1.73   BUN/Creatinine Ratio 21 11 - 26   Sodium 138 134 - 144 mmol/L   Potassium 4.7 3.5 - 5.2 mmol/L   Chloride 101 97 - 108 mmol/L   CO2 22 18 - 29 mmol/L   Calcium 9.0 8.7 - 10.3 mg/dL   Total Protein 6.7 6.0 - 8.5 g/dL   Albumin 4.2 3.5 - 4.8 g/dL   Globulin, Total 2.5 1.5 - 4.5 g/dL   Albumin/Globulin Ratio 1.7 1.1 - 2.5   Bilirubin Total 0.3 0.0 - 1.2 mg/dL   Alkaline Phosphatase 85 39 - 117 IU/L   AST 19 0 - 40 IU/L   ALT 14 0 - 32 IU/L  Lipid Panel Piccolo, Waived  Result Value Ref Range   Cholesterol Piccolo,  Waived 221 (H) <200 mg/dL   HDL Chol Piccolo, Waived 46 (L) >59 mg/dL   Triglycerides Piccolo,Waived 254 (H) <150 mg/dL   Chol/HDL Ratio Piccolo,Waive 4.8 mg/dL   LDL Chol Calc Piccolo Waived 124 (H) <100 mg/dL   VLDL Chol Calc Piccolo,Waive 51 (H) <30 mg/dL  TSH  Result Value Ref Range   TSH 1.200 0.450 - 4.500 uIU/mL  Urinalysis, Routine w reflex microscopic  Result Value Ref Range   Specific Gravity, UA 1.020 1.005 - 1.030   pH, UA 5.5 5.0 - 7.5   Color, UA Yellow Yellow   Appearance Ur Clear Clear   Leukocytes, UA Negative Negative   Protein, UA Negative Negative/Trace   Glucose, UA Negative Negative   Ketones, UA Negative Negative   RBC, UA Negative Negative   Bilirubin, UA Negative Negative   Urobilinogen, Ur 0.2 0.2  - 1.0 mg/dL   Nitrite, UA Negative Negative  CBC With Differential/Platelet  Result Value Ref Range   WBC 5.9 3.4 - 10.8 x10E3/uL   RBC 3.76 (L) 3.77 - 5.28 x10E6/uL   Hemoglobin 12.6 11.1 - 15.9 g/dL   Hematocrit 38.4 34.0 - 46.6 %   MCV 102 (H) 79 - 97 fL   MCH 33.5 (H) 26.6 - 33.0 pg   MCHC 32.8 31.5 - 35.7 g/dL   RDW 13.6 12.3 - 15.4 %   Platelets 179 150 - 379 x10E3/uL   Neutrophils 58 %   Lymphs 28 %   MID 14 %   Neutrophils Absolute 3.5 1.4 - 7.0 x10E3/uL   Lymphocytes Absolute 1.6 0.7 - 3.1 x10E3/uL   MID (Absolute) 0.8 0.1 - 1.6 X10E3/uL      Assessment & Plan:   Problem List Items Addressed This Visit      Cardiovascular and Mediastinum   Hypertension    Currently episodically elevated will observe blood pressure and if remains elevated will start therapy        Other   Hyperlipidemia    Remains elevated with a goal LDL of less than 70 discussed risk benefits of medication and nontreatment. Patient adamant about not starting treatment will do dietary therapy which was discussed.       Other Visit Diagnoses    Hyperlipemia    -  Primary    Relevant Orders    LP+ALT+AST Piccolo, Waived    Immunization due        Relevant Orders    Flu Vaccine QUAD 36+ mos PF IM (Fluarix & Fluzone Quad PF) (Completed)        Follow up plan: Return in about 6 months (around 07/13/2015), or if symptoms worsen or fail to improve, for recheck lipids and BP.

## 2015-02-10 DIAGNOSIS — G5622 Lesion of ulnar nerve, left upper limb: Secondary | ICD-10-CM | POA: Diagnosis not present

## 2015-03-05 DIAGNOSIS — M3501 Sicca syndrome with keratoconjunctivitis: Secondary | ICD-10-CM | POA: Diagnosis not present

## 2015-03-16 DIAGNOSIS — G629 Polyneuropathy, unspecified: Secondary | ICD-10-CM | POA: Diagnosis not present

## 2015-04-06 ENCOUNTER — Other Ambulatory Visit: Payer: Self-pay | Admitting: Orthopedic Surgery

## 2015-04-06 DIAGNOSIS — G5622 Lesion of ulnar nerve, left upper limb: Secondary | ICD-10-CM | POA: Diagnosis not present

## 2015-04-27 ENCOUNTER — Ambulatory Visit: Payer: No Typology Code available for payment source

## 2015-04-29 ENCOUNTER — Ambulatory Visit
Admission: RE | Admit: 2015-04-29 | Discharge: 2015-04-29 | Disposition: A | Discharge status: Home / Self Care | Payer: Medicare Other | Source: Ambulatory Visit | Attending: Orthopedic Surgery | Admitting: Orthopedic Surgery

## 2015-04-29 DIAGNOSIS — M2578 Osteophyte, vertebrae: Secondary | ICD-10-CM | POA: Diagnosis not present

## 2015-04-29 DIAGNOSIS — R200 Anesthesia of skin: Secondary | ICD-10-CM | POA: Insufficient documentation

## 2015-04-29 DIAGNOSIS — G5622 Lesion of ulnar nerve, left upper limb: Secondary | ICD-10-CM | POA: Insufficient documentation

## 2015-04-29 DIAGNOSIS — R202 Paresthesia of skin: Secondary | ICD-10-CM | POA: Insufficient documentation

## 2015-04-29 DIAGNOSIS — M47812 Spondylosis without myelopathy or radiculopathy, cervical region: Secondary | ICD-10-CM | POA: Diagnosis not present

## 2015-06-04 DIAGNOSIS — M5090 Cervical disc disorder, unspecified, unspecified cervical region: Secondary | ICD-10-CM | POA: Diagnosis not present

## 2015-06-04 DIAGNOSIS — G5622 Lesion of ulnar nerve, left upper limb: Secondary | ICD-10-CM | POA: Diagnosis not present

## 2015-06-04 DIAGNOSIS — G629 Polyneuropathy, unspecified: Secondary | ICD-10-CM | POA: Diagnosis not present

## 2015-06-04 DIAGNOSIS — R29898 Other symptoms and signs involving the musculoskeletal system: Secondary | ICD-10-CM | POA: Diagnosis not present

## 2015-06-04 DIAGNOSIS — R200 Anesthesia of skin: Secondary | ICD-10-CM | POA: Diagnosis not present

## 2015-06-05 ENCOUNTER — Other Ambulatory Visit: Payer: Self-pay | Admitting: Neurology

## 2015-06-05 DIAGNOSIS — R200 Anesthesia of skin: Secondary | ICD-10-CM

## 2015-06-05 DIAGNOSIS — R29898 Other symptoms and signs involving the musculoskeletal system: Secondary | ICD-10-CM

## 2015-06-08 ENCOUNTER — Ambulatory Visit
Admission: RE | Admit: 2015-06-08 | Discharge: 2015-06-08 | Disposition: A | Discharge status: Home / Self Care | Payer: Medicare Other | Source: Ambulatory Visit | Attending: Neurology | Admitting: Neurology

## 2015-06-08 DIAGNOSIS — R531 Weakness: Secondary | ICD-10-CM | POA: Diagnosis not present

## 2015-06-08 DIAGNOSIS — I679 Cerebrovascular disease, unspecified: Secondary | ICD-10-CM | POA: Diagnosis not present

## 2015-06-08 DIAGNOSIS — R200 Anesthesia of skin: Secondary | ICD-10-CM | POA: Insufficient documentation

## 2015-06-08 DIAGNOSIS — G5622 Lesion of ulnar nerve, left upper limb: Secondary | ICD-10-CM | POA: Diagnosis not present

## 2015-06-08 DIAGNOSIS — E785 Hyperlipidemia, unspecified: Secondary | ICD-10-CM | POA: Diagnosis not present

## 2015-06-08 DIAGNOSIS — G319 Degenerative disease of nervous system, unspecified: Secondary | ICD-10-CM | POA: Diagnosis not present

## 2015-06-08 DIAGNOSIS — R29898 Other symptoms and signs involving the musculoskeletal system: Secondary | ICD-10-CM | POA: Diagnosis present

## 2015-06-08 DIAGNOSIS — G629 Polyneuropathy, unspecified: Secondary | ICD-10-CM | POA: Diagnosis not present

## 2015-06-08 DIAGNOSIS — I10 Essential (primary) hypertension: Secondary | ICD-10-CM | POA: Insufficient documentation

## 2015-06-08 DIAGNOSIS — R9082 White matter disease, unspecified: Secondary | ICD-10-CM | POA: Insufficient documentation

## 2015-06-16 ENCOUNTER — Encounter: Payer: Self-pay | Admitting: General Surgery

## 2015-06-16 ENCOUNTER — Ambulatory Visit (INDEPENDENT_AMBULATORY_CARE_PROVIDER_SITE_OTHER): Payer: Medicare Other | Admitting: General Surgery

## 2015-06-16 VITALS — BP 146/82 | HR 90 | Resp 14 | Ht 64.5 in | Wt 195.0 lb

## 2015-06-16 DIAGNOSIS — I779 Disorder of arteries and arterioles, unspecified: Secondary | ICD-10-CM

## 2015-06-16 DIAGNOSIS — I8393 Asymptomatic varicose veins of bilateral lower extremities: Secondary | ICD-10-CM

## 2015-06-16 DIAGNOSIS — I8390 Asymptomatic varicose veins of unspecified lower extremity: Secondary | ICD-10-CM

## 2015-06-16 NOTE — Progress Notes (Addendum)
Patient ID: Bailey Ayala, female   DOB: [birth year], 75 y.o.   MRN: [id number]  Chief Complaint  Patient presents with  . Follow-up    PAD and VV    HPI Bailey Ayala is a 75 y.o. female.  Patient here for follow up for PAD and varicose veins.  She states that her legs are not any worse. She states that at night is the worse and Motrin does help. She has been trying to keep her legs elevated. She did have a TIA last year with left hand weakness. She says she hurts in left leg after walking about half to one block. This has not changed in a while.  HPI  Past Medical History  Diagnosis Date  . Arthritis   . Diffuse cystic mastopathy   . Thyroid disease   . Hyperlipidemia   . Hypertension   . TIA (transient ischemic attack) 2016    left hand weakness  . L4-L5 disc bulge     Past Surgical History  Procedure Laterality Date  . Abdominal hysterectomy    . Tonsillectomy    . Vein closure procedure Left 2010  . Colonoscopy  1998  . Knee surgery Left 2012  . Back surgery  2011  . Eye surgery Bilateral 05/06/14  . Joint replacement    . Spine surgery    . Parathyroid transplant      Family History  Problem Relation Age of Onset  . Heart disease Mother     MI  . Arthritis Mother   . Stroke Mother   . Heart disease Father     MI    Social History Social History  Substance Use Topics  . Smoking status: Former Smoker    Quit date: 11/03/1989  . Smokeless tobacco: Never Used  . Alcohol Use: 0.0 oz/week    0 Standard drinks or equivalent per week     Comment: occasionally    Allergies  Allergen Reactions  . Codeine     hallucinations    . Simvastatin Diarrhea and Nausea Only    Current Outpatient Prescriptions  Medication Sig Dispense Refill  . Multiple Vitamins-Minerals (CENTRUM SILVER ULTRA WOMENS PO) Take by mouth.    . SYNTHROID 112 MCG tablet Take 1 tablet (112 mcg total) by mouth daily before breakfast. 30 tablet 12   No current  facility-administered medications for this visit.    Review of Systems Review of Systems  Constitutional: Negative.   Respiratory: Negative.   Cardiovascular: Negative.     Blood pressure 146/82, pulse 90, resp. rate 14, height 5' 4.5" (1.638 m), weight 195 lb (88.451 kg).  Physical Exam Physical Exam  Constitutional: She is oriented to person, place, and time. She appears well-developed and well-nourished.  HENT:  Mouth/Throat: Oropharynx is clear and moist.  Eyes: Conjunctivae are normal. No scleral icterus.  Neck: Neck supple.  Cardiovascular:  Pulses:      Dorsalis pedis pulses are 0 on the right side, and 0 on the left side.       Posterior tibial pulses are 0 on the right side, and 0 on the left side.  No lowe leg edema. VV present. Feet are warm. Cap refill <2secs. Hard to feel pedal pulses.  Lymphadenopathy:    She has no cervical adenopathy.  Neurological: She is alert and oriented to person, place, and time.  Skin: Skin is warm and dry.  Psychiatric: Her behavior is normal.    Data Reviewed Prior notes. ABI- essentially stable-  left index 0.59, right 0.89. Both values are slightly lower than 6 mos ago Assessment    Doppler flow appears more normal -biphasic-on right. Monophasic flow on left.   PAD, VV both stable.  Plan     Follow up in 6 months. The patient is aware to call back for any questions or concerns.      PCP:  Golden Pop A This information has been scribed by Karie Fetch RNBC.   Clorinda Wyble G 06/30/2015, 8:46 AM

## 2015-06-16 NOTE — Patient Instructions (Signed)
The patient is aware to call back for any questions or concerns.  

## 2015-06-22 DIAGNOSIS — Z6833 Body mass index (BMI) 33.0-33.9, adult: Secondary | ICD-10-CM | POA: Diagnosis not present

## 2015-06-22 DIAGNOSIS — M5412 Radiculopathy, cervical region: Secondary | ICD-10-CM | POA: Diagnosis not present

## 2015-07-02 ENCOUNTER — Encounter: Payer: Self-pay | Admitting: *Deleted

## 2015-07-07 DIAGNOSIS — M9981 Other biomechanical lesions of cervical region: Secondary | ICD-10-CM | POA: Diagnosis not present

## 2015-07-07 DIAGNOSIS — G5621 Lesion of ulnar nerve, right upper limb: Secondary | ICD-10-CM | POA: Diagnosis not present

## 2015-07-07 DIAGNOSIS — G5622 Lesion of ulnar nerve, left upper limb: Secondary | ICD-10-CM | POA: Diagnosis not present

## 2015-07-07 DIAGNOSIS — G5601 Carpal tunnel syndrome, right upper limb: Secondary | ICD-10-CM | POA: Diagnosis not present

## 2015-07-13 DIAGNOSIS — Z1231 Encounter for screening mammogram for malignant neoplasm of breast: Secondary | ICD-10-CM | POA: Diagnosis not present

## 2015-07-15 ENCOUNTER — Ambulatory Visit: Payer: Medicare Other | Admitting: Family Medicine

## 2015-07-15 DIAGNOSIS — G5622 Lesion of ulnar nerve, left upper limb: Secondary | ICD-10-CM | POA: Diagnosis not present

## 2015-07-15 DIAGNOSIS — Z6832 Body mass index (BMI) 32.0-32.9, adult: Secondary | ICD-10-CM | POA: Diagnosis not present

## 2015-07-17 DIAGNOSIS — M47812 Spondylosis without myelopathy or radiculopathy, cervical region: Secondary | ICD-10-CM | POA: Diagnosis not present

## 2015-07-17 DIAGNOSIS — M5030 Other cervical disc degeneration, unspecified cervical region: Secondary | ICD-10-CM | POA: Diagnosis not present

## 2015-07-17 DIAGNOSIS — I10 Essential (primary) hypertension: Secondary | ICD-10-CM | POA: Diagnosis not present

## 2015-07-17 DIAGNOSIS — Z6833 Body mass index (BMI) 33.0-33.9, adult: Secondary | ICD-10-CM | POA: Diagnosis not present

## 2015-07-17 DIAGNOSIS — G5621 Lesion of ulnar nerve, right upper limb: Secondary | ICD-10-CM | POA: Diagnosis not present

## 2015-07-17 DIAGNOSIS — G5601 Carpal tunnel syndrome, right upper limb: Secondary | ICD-10-CM | POA: Diagnosis not present

## 2015-07-17 DIAGNOSIS — G5622 Lesion of ulnar nerve, left upper limb: Secondary | ICD-10-CM | POA: Diagnosis not present

## 2015-07-20 ENCOUNTER — Encounter: Payer: Self-pay | Admitting: General Surgery

## 2015-07-21 ENCOUNTER — Ambulatory Visit (INDEPENDENT_AMBULATORY_CARE_PROVIDER_SITE_OTHER): Payer: Medicare Other | Admitting: General Surgery

## 2015-07-21 ENCOUNTER — Encounter: Payer: Self-pay | Admitting: General Surgery

## 2015-07-21 VITALS — BP 168/82 | HR 88 | Resp 16 | Ht 64.0 in | Wt 197.0 lb

## 2015-07-21 DIAGNOSIS — N6019 Diffuse cystic mastopathy of unspecified breast: Secondary | ICD-10-CM

## 2015-07-21 DIAGNOSIS — I779 Disorder of arteries and arterioles, unspecified: Secondary | ICD-10-CM

## 2015-07-21 NOTE — Progress Notes (Signed)
Patient ID: Bailey Ayala, female   DOB: 1941-01-10, 75 y.o.   MRN: [id number]  Chief Complaint  Patient presents with  . Follow-up    mammogram    HPI Bailey Ayala is a 75 y.o. female who presents for a breast evaluation. The most recent mammogram was done on 07/13/15.  Patient does perform regular self breast checks and gets regular mammograms done.  She is scheduled to have left ulnar nerve release done next month. I have reviewed the history of present illness with the patient.  HPI  Past Medical History  Diagnosis Date  . Arthritis   . Diffuse cystic mastopathy   . Thyroid disease   . Hyperlipidemia   . Hypertension   . TIA (transient ischemic attack) 2016    left hand weakness  . L4-L5 disc bulge     Past Surgical History  Procedure Laterality Date  . Abdominal hysterectomy    . Tonsillectomy    . Vein closure procedure Left 2010  . Colonoscopy  1998  . Knee surgery Left 2012  . Back surgery  2011  . Eye surgery Bilateral 05/06/14  . Joint replacement    . Spine surgery    . Parathyroid transplant      Family History  Problem Relation Age of Onset  . Heart disease Mother     MI  . Arthritis Mother   . Stroke Mother   . Heart disease Father     MI    Social History Social History  Substance Use Topics  . Smoking status: Former Smoker    Quit date: 11/03/1989  . Smokeless tobacco: Never Used  . Alcohol Use: 0.0 oz/week    0 Standard drinks or equivalent per week     Comment: occasionally    Allergies  Allergen Reactions  . Codeine     hallucinations    . Simvastatin Diarrhea and Nausea Only    Current Outpatient Prescriptions  Medication Sig Dispense Refill  . Multiple Vitamins-Minerals (CENTRUM SILVER ULTRA WOMENS PO) Take by mouth.    . SYNTHROID 112 MCG tablet Take 1 tablet (112 mcg total) by mouth daily before breakfast. 30 tablet 12  . thiamine (VITAMIN B-1) 100 MG tablet Take 100 mg by mouth daily.     No current  facility-administered medications for this visit.    Review of Systems Review of Systems  Constitutional: Negative.   Respiratory: Negative.   Cardiovascular: Negative.     Blood pressure 168/82, pulse 88, resp. rate 16, height 5\' 4"  (1.626 m), weight 197 lb (89.359 kg).  Physical Exam Physical Exam  Constitutional: She is oriented to person, place, and time. She appears well-developed and well-nourished.  Eyes: Conjunctivae are normal. No scleral icterus.  Neck: Neck supple.  Cardiovascular: Normal rate, regular rhythm and normal heart sounds.   Pulmonary/Chest: Effort normal and breath sounds normal. Right breast exhibits no inverted nipple, no mass, no nipple discharge, no skin change and no tenderness. Left breast exhibits no inverted nipple, no mass, no nipple discharge, no skin change and no tenderness.  Abdominal: Soft. Normal appearance and bowel sounds are normal. There is no hepatomegaly. There is no tenderness. No hernia.  Lymphadenopathy:    She has no cervical adenopathy.    She has no axillary adenopathy.  Neurological: She is alert and oriented to person, place, and time.  Skin: Skin is warm and dry.    Data Reviewed Mammogram reviewed  Assessment    Stable exam, Fibrocystic breast  disease     Plan   The patient has been asked to return to the office in one year with a bilateral diagnostic mammogram.  This information has been scribed by Gaspar Cola CMA.  PCP. Alex Gardener 07/22/2015, 3:56 PM

## 2015-07-21 NOTE — Patient Instructions (Signed)
The patient has been asked to return to the office in one year with a bilateral diagnostic mammogram. 

## 2015-07-22 ENCOUNTER — Encounter: Payer: Self-pay | Admitting: General Surgery

## 2015-07-23 ENCOUNTER — Encounter: Payer: Self-pay | Admitting: General Surgery

## 2015-07-23 DIAGNOSIS — Z96659 Presence of unspecified artificial knee joint: Secondary | ICD-10-CM | POA: Diagnosis not present

## 2015-07-28 ENCOUNTER — Ambulatory Visit (INDEPENDENT_AMBULATORY_CARE_PROVIDER_SITE_OTHER): Payer: Medicare Other | Admitting: Family Medicine

## 2015-07-28 ENCOUNTER — Encounter: Payer: Self-pay | Admitting: Family Medicine

## 2015-07-28 VITALS — BP 138/70 | HR 94 | Temp 97.9°F | Ht 64.8 in | Wt 193.0 lb

## 2015-07-28 DIAGNOSIS — L723 Sebaceous cyst: Secondary | ICD-10-CM | POA: Diagnosis not present

## 2015-07-28 DIAGNOSIS — I10 Essential (primary) hypertension: Secondary | ICD-10-CM

## 2015-07-28 DIAGNOSIS — I779 Disorder of arteries and arterioles, unspecified: Secondary | ICD-10-CM | POA: Diagnosis not present

## 2015-07-28 DIAGNOSIS — E785 Hyperlipidemia, unspecified: Secondary | ICD-10-CM

## 2015-07-28 DIAGNOSIS — L089 Local infection of the skin and subcutaneous tissue, unspecified: Secondary | ICD-10-CM

## 2015-07-28 MED ORDER — BENAZEPRIL HCL 20 MG PO TABS: 20.0000 mg | ORAL_TABLET | Freq: Every day | ORAL | Status: DC

## 2015-07-28 NOTE — Progress Notes (Signed)
BP 138/70 mmHg  Pulse 94  Temp(Src) 97.9 F (36.6 C)  Ht 5' 4.8" (1.646 m)  Wt 193 lb (87.544 kg)  BMI 32.31 kg/m2  SpO2 92%   Subjective:    Patient ID: Bailey Ayala, female    DOB: May 31, 1940, 75 y.o.   MRN: [id number]  HPI: [name] is a 75 y.o. female  Chief Complaint  Patient presents with  . Hyperlipidemia  Patient not taking cholesterol medicines and like panic result on lipid panel Patient noted elevated blood pressure on other doctor visits with borderline elevation today. Due to multiple doctor visits coming up will begin blood pressure medications Patient with infected sebaceous cyst on her back which is getting more painful and larger Relevant past medical, surgical, family and social history reviewed and updated as indicated. Interim medical history since our last visit reviewed. Allergies and medications reviewed and updated.  Review of Systems  Constitutional: Negative.   Respiratory: Negative.   Cardiovascular: Negative.     Per HPI unless specifically indicated above     Objective:    BP 138/70 mmHg  Pulse 94  Temp(Src) 97.9 F (36.6 C)  Ht 5' 4.8" (1.646 m)  Wt 193 lb (87.544 kg)  BMI 32.31 kg/m2  SpO2 92%  Wt Readings from Last 3 Encounters:  07/28/15 193 lb (87.544 kg)  07/21/15 197 lb (89.359 kg)  06/16/15 195 lb (88.451 kg)    Physical Exam  Constitutional: She is oriented to person, place, and time. She appears well-developed and well-nourished. No distress.  HENT:  Head: Normocephalic and atraumatic.  Right Ear: Hearing normal.  Left Ear: Hearing normal.  Nose: Nose normal.  Eyes: Conjunctivae and lids are normal. Right eye exhibits no discharge. Left eye exhibits no discharge. No scleral icterus.  Cardiovascular: Normal rate, regular rhythm and normal heart sounds.   Pulmonary/Chest: Effort normal and breath sounds normal. No respiratory distress.  Musculoskeletal: Normal range of motion.  Neurological: She is alert  and oriented to person, place, and time.  Skin: Skin is intact. No rash noted.  Patient also with sebaceous cyst left lower back has previously been evacuated by patient several years ago his now is back red painful getting larger by the day. Procedure note : Area was prepped with Betadine and alcohol after informed consent anesthesia Xylocaine with epinephrine and incised range large amount of sebaceous material sebaceous sac was scarred down and required extensive sharp dissection and more anesthesia due to the complication of the size and scarring of this lesion. This was complicated surgical excision of sebaceous cyst. Patient education given on wound care   Psychiatric: She has a normal mood and affect. Her speech is normal and behavior is normal. Judgment and thought content normal. Cognition and memory are normal.    Results for orders placed or performed in visit on 01/13/15  LP+ALT+AST Piccolo, [employer]  Result Value Ref Range   ALT (SGPT) Piccolo, Waived 20 10 - 47 U/L   AST (SGOT) Piccolo, Waived 26 11 - 38 U/L   Cholesterol Piccolo, Waived 211 (H) <200 mg/dL   HDL Chol Piccolo, Waived 45 (L) >59 mg/dL   Triglycerides Piccolo,Waived 290 (H) <150 mg/dL   Chol/HDL Ratio Piccolo,Waive 4.7 mg/dL   LDL Chol Calc Piccolo Waived 108 (H) <100 mg/dL   VLDL Chol Calc Piccolo,Waive 58 (H) <30 mg/dL      Assessment & Plan:   Problem List Items Addressed This Visit      Cardiovascular and Mediastinum  Hypertension    Discussed blood pressure elevation especially at other doctor's offices will start Benzapril medium dose      Relevant Medications   benazepril (LOTENSIN) 20 MG tablet     Other   Hyperlipidemia - Primary    Discuss hypercholesterol and triglycerides with patient's blood work being lipemic today waiting for lipid panel coming back later this week will address and appropriately treat pending results discuss lifestyle diet exercise nutrition weight loss      Relevant  Medications   benazepril (LOTENSIN) 20 MG tablet   Other Relevant Orders   Lipid Panel Piccolo, Waived    Other Visit Diagnoses    Infected sebaceous cyst        See procedure note        Follow up plan: Return in about 4 weeks (around 08/25/2015) for BMP with blood pressure check.

## 2015-07-28 NOTE — Assessment & Plan Note (Signed)
Discuss hypercholesterol and triglycerides with patient's blood work being lipemic today waiting for lipid panel coming back later this week will address and appropriately treat pending results discuss lifestyle diet exercise nutrition weight loss

## 2015-07-28 NOTE — Assessment & Plan Note (Signed)
Discussed blood pressure elevation especially at other doctor's offices will start Benzapril medium dose

## 2015-07-28 NOTE — Addendum Note (Signed)
Addended by: Rowe Clack H on: 07/28/2015 04:11 PM   Modules accepted: Orders, SmartSet

## 2015-07-29 ENCOUNTER — Ambulatory Visit (INDEPENDENT_AMBULATORY_CARE_PROVIDER_SITE_OTHER): Payer: Medicare Other

## 2015-07-29 DIAGNOSIS — Z4800 Encounter for change or removal of nonsurgical wound dressing: Secondary | ICD-10-CM | POA: Diagnosis not present

## 2015-07-29 LAB — LIPID PANEL W/O CHOL/HDL RATIO
Cholesterol, Total: 223 mg/dL — ABNORMAL HIGH (ref 100–199)
HDL: 38 mg/dL — ABNORMAL LOW (ref >39)
Triglycerides: 402 mg/dL — ABNORMAL HIGH (ref 0–149)

## 2015-07-31 ENCOUNTER — Ambulatory Visit (INDEPENDENT_AMBULATORY_CARE_PROVIDER_SITE_OTHER): Payer: Medicare Other

## 2015-07-31 DIAGNOSIS — Z4800 Encounter for change or removal of nonsurgical wound dressing: Secondary | ICD-10-CM

## 2015-08-03 ENCOUNTER — Telehealth: Payer: Self-pay | Admitting: Family Medicine

## 2015-08-03 NOTE — Telephone Encounter (Signed)
Phone call Discussed with patient sebaceous cyst wound sites healing well and doing well Discuss elevated triglycerides diet exercise nutrition

## 2015-08-03 NOTE — Telephone Encounter (Signed)
Pt would like a call back regarding her procedure follow up

## 2015-08-13 DIAGNOSIS — G5622 Lesion of ulnar nerve, left upper limb: Secondary | ICD-10-CM | POA: Diagnosis not present

## 2015-08-13 DIAGNOSIS — G5623 Lesion of ulnar nerve, bilateral upper limbs: Secondary | ICD-10-CM | POA: Diagnosis not present

## 2015-09-09 NOTE — Progress Notes (Signed)
Done

## 2015-09-29 ENCOUNTER — Ambulatory Visit (INDEPENDENT_AMBULATORY_CARE_PROVIDER_SITE_OTHER): Payer: Medicare Other | Admitting: Family Medicine

## 2015-09-29 ENCOUNTER — Encounter: Payer: Self-pay | Admitting: Family Medicine

## 2015-09-29 VITALS — BP 128/70 | HR 91 | Temp 97.6°F | Ht 64.8 in | Wt 192.0 lb

## 2015-09-29 DIAGNOSIS — R5382 Chronic fatigue, unspecified: Secondary | ICD-10-CM

## 2015-09-29 DIAGNOSIS — Z23 Encounter for immunization: Secondary | ICD-10-CM

## 2015-09-29 DIAGNOSIS — I779 Disorder of arteries and arterioles, unspecified: Secondary | ICD-10-CM

## 2015-09-29 DIAGNOSIS — I10 Essential (primary) hypertension: Secondary | ICD-10-CM | POA: Diagnosis not present

## 2015-09-29 DIAGNOSIS — E039 Hypothyroidism, unspecified: Secondary | ICD-10-CM

## 2015-09-29 DIAGNOSIS — R5383 Other fatigue: Secondary | ICD-10-CM | POA: Insufficient documentation

## 2015-09-29 MED ORDER — BENAZEPRIL HCL 20 MG PO TABS: 20.0000 mg | ORAL_TABLET | Freq: Every day | ORAL | Status: DC

## 2015-09-29 MED ORDER — LEVOTHYROXINE SODIUM 112 MCG PO TABS: 112.0000 ug | ORAL_TABLET | Freq: Every day | ORAL | Status: DC

## 2015-09-29 MED ORDER — MOMETASONE FUROATE 0.1 % EX CREA: 1.0000 "application " | TOPICAL_CREAM | Freq: Every day | CUTANEOUS | Status: DC

## 2015-09-29 NOTE — Assessment & Plan Note (Signed)
The current medical regimen is effective;  continue present plan and medications.  

## 2015-09-29 NOTE — Progress Notes (Signed)
BP 128/70 mmHg  Pulse 91  Temp(Src) 97.6 F (36.4 C)  Ht 5' 4.8" (1.646 m)  Wt 192 lb (87.091 kg)  BMI 32.15 kg/m2  SpO2 95%   Subjective:    Patient ID: Bailey Ayala, female    DOB: 1940-10-15, 75 y.o.   MRN: [id number]  HPI: Bailey Ayala is a 75 y.o. female  Chief Complaint  Patient presents with  . Hypertension  Patient recheck blood pressure doing well no complaints from medications taken faithfully with good control of blood pressure Concerned today because of some fatigue low thyroid symptoms getting cold very easily even and warm environment wondering if she is taking enough thyroid.'s taking namebrand Synthroid 112 g patient takes her thyroid medications with her other medications at the same time with a cookie and coffee. Patient's done this dosing for 30 years with good TSH levels and no thyroid symptoms.  On further review patient's fatigue is also exertional. She has done very little exertion because of her fatigue gets tired very easily with walking. She is also limited to walking because of claudication. No nausea vomiting diaphoresis associated with these symptoms, no PND orthopnea.  Relevant past medical, surgical, family and social history reviewed and updated as indicated. Interim medical history since our last visit reviewed. Allergies and medications reviewed and updated.  Review of Systems  Constitutional: Negative for fever, chills, diaphoresis and fatigue.  Respiratory: Negative.   Cardiovascular: Negative.     Per HPI unless specifically indicated above     Objective:    BP 128/70 mmHg  Pulse 91  Temp(Src) 97.6 F (36.4 C)  Ht 5' 4.8" (1.646 m)  Wt 192 lb (87.091 kg)  BMI 32.15 kg/m2  SpO2 95%  Wt Readings from Last 3 Encounters:  09/29/15 192 lb (87.091 kg)  07/28/15 193 lb (87.544 kg)  07/21/15 197 lb (89.359 kg)    Physical Exam  Constitutional: She is oriented to person, place, and time. She appears well-developed and  well-nourished. No distress.  HENT:  Head: Normocephalic and atraumatic.  Right Ear: Hearing normal.  Left Ear: Hearing normal.  Nose: Nose normal.  Eyes: Conjunctivae and lids are normal. Right eye exhibits no discharge. Left eye exhibits no discharge. No scleral icterus.  Neck: No tracheal deviation present. No thyromegaly present.  Cardiovascular: Normal rate, regular rhythm and normal heart sounds.   Pulmonary/Chest: Effort normal and breath sounds normal. No respiratory distress.  Musculoskeletal: Normal range of motion.  Lymphadenopathy:    She has no cervical adenopathy.  Neurological: She is alert and oriented to person, place, and time.  Skin: Skin is intact. No rash noted.  Psychiatric: She has a normal mood and affect. Her speech is normal and behavior is normal. Judgment and thought content normal. Cognition and memory are normal.    Results for orders placed or performed in visit on 07/28/15  Lipid Panel w/o Chol/HDL Ratio  Result Value Ref Range   Cholesterol, Total 223 (H) 100 - 199 mg/dL   Triglycerides 402 (H) 0 - 149 mg/dL   HDL 38 (L) >39 mg/dL   VLDL Cholesterol Cal Comment 5 - 40 mg/dL   LDL Calculated Comment 0 - 99 mg/dL      Assessment & Plan:   Problem List Items Addressed This Visit      Cardiovascular and Mediastinum   Hypertension - Primary    The current medical regimen is effective;  continue present plan and medications.  Relevant Medications   benazepril (LOTENSIN) 20 MG tablet   Other Relevant Orders   Basic metabolic panel     Endocrine   Hypothyroid    Patient with some low thyroid symptoms will go ahead and check TSH today Also with patient's request will change to generic thyroid Will not consider changing patient's dosing mechanism as she is been stable previously will assess TSH and reassess thyroid dosing.      Relevant Medications   levothyroxine (SYNTHROID, LEVOTHROID) 112 MCG tablet   Other Relevant Orders   TSH      Other   Fatigue    Doubt if fatigue symptoms are coming from thyroid but will check TSH Concerned about possibility of coronary artery disease causing symptoms especially with patient's profound peripheral artery disease. Will refer to cardiology to further evaluate.      Relevant Orders   EKG 12-Lead   Ambulatory referral to Cardiology    Other Visit Diagnoses    Immunization due        Relevant Orders    Pneumococcal conjugate vaccine 13-valent IM (Completed)        Follow up plan: Return in about 2 months (around 11/29/2015) for Physical Exam.

## 2015-09-29 NOTE — Assessment & Plan Note (Signed)
Patient with some low thyroid symptoms will go ahead and check TSH today Also with patient's request will change to generic thyroid Will not consider changing patient's dosing mechanism as she is been stable previously will assess TSH and reassess thyroid dosing.

## 2015-09-29 NOTE — Assessment & Plan Note (Signed)
Doubt if fatigue symptoms are coming from thyroid but will check TSH Concerned about possibility of coronary artery disease causing symptoms especially with patient's profound peripheral artery disease. Will refer to cardiology to further evaluate.

## 2015-09-29 NOTE — Patient Instructions (Signed)
Pneumococcal Conjugate Vaccine (PCV13)   1. Why get vaccinated?  Vaccination can protect both children and adults from pneumococcal disease.  Pneumococcal disease is caused by bacteria that can spread from person to person through close contact. It can cause ear infections, and it can also lead to more serious infections of the:  · Lungs (pneumonia),  · Blood (bacteremia), and  · Covering of the brain and spinal cord (meningitis).  Pneumococcal pneumonia is most common among adults. Pneumococcal meningitis can cause deafness and brain damage, and it kills about 1 child in 10 who get it.  Anyone can get pneumococcal disease, but children under 2 years of age and adults 65 years and older, people with certain medical conditions, and cigarette smokers are at the highest risk.  Before there was a vaccine, the [place] saw:  · more than 700 cases of meningitis,  · about 13,000 blood infections,  · about 5 million ear infections, and  · about 200 deaths  in children under 5 each year from pneumococcal disease. Since vaccine became available, severe pneumococcal disease in these children has fallen by 88%.  About 18,000 older adults die of pneumococcal disease each year in the [place].  Treatment of pneumococcal infections with penicillin and other drugs is not as effective as it used to be, because some strains of the disease have become resistant to these drugs. This makes prevention of the disease, through vaccination, even more important.  2. PCV13 vaccine  Pneumococcal conjugate vaccine (called PCV13) protects against 13 types of pneumococcal bacteria.  PCV13 is routinely given to children at 2, 4, 6, and 12-15 months of age. It is also recommended for children and adults 2 to 64 years of age with certain health conditions, and for all adults 65 years of age and older. Your doctor can give you details.  3. Some people should not get this vaccine  Anyone who has ever had a life-threatening allergic reaction  to a dose of this vaccine, to an earlier pneumococcal vaccine called PCV7, or to any vaccine containing diphtheria toxoid (for example, DTaP), should not get PCV13.  Anyone with a severe allergy to any component of PCV13 should not get the vaccine. Tell your doctor if the person being vaccinated has any severe allergies.  If the person scheduled for vaccination is not feeling well, your healthcare provider might decide to reschedule the shot on another day.  4. Risks of a vaccine reaction  With any medicine, including vaccines, there is a chance of reactions. These are usually mild and go away on their own, but serious reactions are also possible.  Problems reported following PCV13 varied by age and dose in the series. The most common problems reported among children were:  · About half became drowsy after the shot, had a temporary loss of appetite, or had redness or tenderness where the shot was given.  · About 1 out of 3 had swelling where the shot was given.  · About 1 out of 3 had a mild fever, and about 1 in 20 had a fever over 102.2°F.  · Up to about 8 out of 10 became fussy or irritable.  Adults have reported pain, redness, and swelling where the shot was given; also mild fever, fatigue, headache, chills, or muscle pain.  Young children who get PCV13 along with inactivated flu vaccine at the same time may be at increased risk for seizures caused by fever. Ask your doctor for more information.  Problems that   could happen after any vaccine:  · People sometimes faint after a medical procedure, including vaccination. Sitting or lying down for about 15 minutes can help prevent fainting, and injuries caused by a fall. Tell your doctor if you feel dizzy, or have vision changes or ringing in the ears.  · Some older children and adults get severe pain in the shoulder and have difficulty moving the arm where a shot was given. This happens very rarely.  · Any medication can cause a severe allergic reaction. Such  reactions from a vaccine are very rare, estimated at about 1 in a million doses, and would happen within a few minutes to a few hours after the vaccination.  As with any medicine, there is a very small chance of a vaccine causing a serious injury or death.  The safety of vaccines is always being monitored. For more information, visit: www.cdc.gov/vaccinesafety/  5. What if there is a serious reaction?  What should I look for?  · Look for anything that concerns you, such as signs of a severe allergic reaction, very high fever, or unusual behavior.  Signs of a severe allergic reaction can include hives, swelling of the face and throat, difficulty breathing, a fast heartbeat, dizziness, and weakness-usually within a few minutes to a few hours after the vaccination.  What should I do?  · If you think it is a severe allergic reaction or other emergency that can't wait, call 9-1-1 or get the person to the nearest hospital. Otherwise, call your doctor.  Reactions should be reported to the Vaccine Adverse Event Reporting System (VAERS). Your doctor should file this report, or you can do it yourself through the VAERS web site at www.vaers.hhs.gov, or by calling 1-800-822-7967.  VAERS does not give medical advice.  6. The National Vaccine Injury Compensation Program  The National Vaccine Injury Compensation Program (VICP) is a federal program that was created to compensate people who may have been injured by certain vaccines.  Persons who believe they may have been injured by a vaccine can learn about the program and about filing a claim by calling 1-800-338-2382 or visiting the VICP website at www.hrsa.gov/vaccinecompensation. There is a time limit to file a claim for compensation.  7. How can I learn more?  · Ask your healthcare provider. He or she can give you the vaccine package insert or suggest other sources of information.  · Call your local or state health department.  · Contact the Centers for Disease Control and  Prevention (CDC):    Call 1-800-232-4636 (1-800-CDC-INFO) or    Visit CDC's website at www.cdc.gov/vaccines  Vaccine Information Statement  PCV13 Vaccine (02/27/2014)     This information is not intended to replace advice given to you by your health care provider. Make sure you discuss any questions you have with your health care provider.     Document Released: 02/06/2006 Document Revised: 05/02/2014 Document Reviewed: 03/06/2014  [employer]vier Interactive Patient Education ©2016 [employer]

## 2015-09-30 ENCOUNTER — Encounter: Payer: Self-pay | Admitting: Family Medicine

## 2015-09-30 LAB — BASIC METABOLIC PANEL
BUN/Creatinine Ratio: 20 (ref 12–28)
BUN: 25 mg/dL (ref 8–27)
CO2: 22 mmol/L (ref 18–29)
Calcium: 9.4 mg/dL (ref 8.7–10.3)
Chloride: 99 mmol/L (ref 96–106)
Creatinine, Ser: 1.27 mg/dL — ABNORMAL HIGH (ref 0.57–1.00)
GFR calc Af Amer: 48 mL/min/{1.73_m2} — ABNORMAL LOW (ref >59)
GFR calc non Af Amer: 42 mL/min/{1.73_m2} — ABNORMAL LOW (ref >59)
Glucose: 101 mg/dL — ABNORMAL HIGH (ref 65–99)
Potassium: 4.9 mmol/L (ref 3.5–5.2)
Sodium: 139 mmol/L (ref 134–144)

## 2015-09-30 LAB — TSH: TSH: 1.02 u[IU]/mL (ref 0.450–4.500)

## 2015-10-05 DIAGNOSIS — M7542 Impingement syndrome of left shoulder: Secondary | ICD-10-CM | POA: Diagnosis not present

## 2015-10-05 DIAGNOSIS — M19019 Primary osteoarthritis, unspecified shoulder: Secondary | ICD-10-CM | POA: Diagnosis not present

## 2015-10-22 DIAGNOSIS — M25612 Stiffness of left shoulder, not elsewhere classified: Secondary | ICD-10-CM | POA: Diagnosis not present

## 2015-10-22 DIAGNOSIS — M25512 Pain in left shoulder: Secondary | ICD-10-CM | POA: Diagnosis not present

## 2015-11-04 DIAGNOSIS — M25512 Pain in left shoulder: Secondary | ICD-10-CM | POA: Diagnosis not present

## 2015-11-04 DIAGNOSIS — M25612 Stiffness of left shoulder, not elsewhere classified: Secondary | ICD-10-CM | POA: Diagnosis not present

## 2015-11-05 ENCOUNTER — Encounter: Payer: Self-pay | Admitting: Cardiovascular Disease

## 2015-11-05 ENCOUNTER — Ambulatory Visit (INDEPENDENT_AMBULATORY_CARE_PROVIDER_SITE_OTHER): Payer: Medicare Other | Admitting: Cardiovascular Disease

## 2015-11-05 VITALS — BP 135/80 | HR 69 | Ht 64.0 in | Wt 189.8 lb

## 2015-11-05 DIAGNOSIS — I739 Peripheral vascular disease, unspecified: Secondary | ICD-10-CM | POA: Diagnosis not present

## 2015-11-05 DIAGNOSIS — I779 Disorder of arteries and arterioles, unspecified: Secondary | ICD-10-CM | POA: Diagnosis not present

## 2015-11-05 DIAGNOSIS — E785 Hyperlipidemia, unspecified: Secondary | ICD-10-CM

## 2015-11-05 DIAGNOSIS — R0602 Shortness of breath: Secondary | ICD-10-CM | POA: Diagnosis not present

## 2015-11-05 MED ORDER — ROSUVASTATIN CALCIUM 10 MG PO TABS: 10.0000 mg | ORAL_TABLET | Freq: Every day | ORAL | Status: DC

## 2015-11-05 NOTE — Patient Instructions (Addendum)
Medication Instructions:  START taking rosuvastatin 10mg  once daily  Labwork: Your physician recommends that you return for a FASTING lipid and liver profile. Nothing to eat or drink after midnight the evening before your labs.    Testing/Procedures: Your physician has requested that you have a lexiscan myoview. For further information please visit [email or web address]. Please follow instruction sheet, as given.  [place]  Your caregiver has ordered a Stress Test with nuclear imaging. The purpose of this test is to evaluate the blood supply to your heart muscle. This procedure is referred to as a "Non-Invasive Stress Test." This is because other than having an IV started in your vein, nothing is inserted or "invades" your body. Cardiac stress tests are done to find areas of poor blood flow to the heart by determining the extent of coronary artery disease (CAD). Some patients exercise on a treadmill, which naturally increases the blood flow to your heart, while others who are  unable to walk on a treadmill due to physical limitations have a pharmacologic/chemical stress agent called Lexiscan . This medicine will mimic walking on a treadmill by temporarily increasing your coronary blood flow.   Please note: these test may take anywhere between 2-4 hours to complete  PLEASE REPORT TO [facility] AT THE FIRST DESK WILL DIRECT YOU WHERE TO GO  Date of Procedure: Monday, July 17 Arrival Time for Procedure: 8:15am  Instructions regarding medication: You may take your morning medications with a sip of water.     PLEASE NOTIFY THE OFFICE AT LEAST 32 HOURS IN ADVANCE IF YOU ARE UNABLE TO KEEP YOUR APPOINTMENT.  [phone] AND  PLEASE NOTIFY NUCLEAR MEDICINE AT [facility] AT LEAST 24 HOURS IN ADVANCE IF YOU ARE UNABLE TO KEEP YOUR APPOINTMENT. [phone]  How to prepare for your Myoview test:   Do not eat or drink after midnight  No caffeine for 24 hours prior  to test  No smoking 24 hours prior to test.  Your medication may be taken with water.  If your doctor stopped a medication because of this test, do not take that medication.  Ladies, please do not wear dresses.  Skirts or pants are appropriate. Please wear a short sleeve shirt.  No perfume, cologne or lotion.  Wear comfortable walking shoes. No heels!   Your physician has requested that you have a lowerextremity arterial duplex. This test is an ultrasound of the arteries in the legs or arms. It looks at arterial blood flow in the legs. Allow one hour for Lower  Arterial scans. There are no restrictions or special instructions         Follow-Up: Your physician recommends that you schedule a follow-up appointment in: 6 weeks with Dr. Fletcher Anon.    Any Other Special Instructions Will Be Listed Below (If Applicable).     If you need a refill on your cardiac medications before your next appointment, please call your pharmacy.  Cardiac Nuclear Scanning A cardiac nuclear scan is used to check your heart for problems, such as the following:  A portion of the heart is not getting enough blood.  Part of the heart muscle has died, which happens with a heart attack.  The heart wall is not working normally.  In this test, a radioactive dye (tracer) is injected into your bloodstream. After the tracer has traveled to your heart, a scanning device is used to measure how much of the tracer is absorbed by or distributed to various areas of  your heart. LET [facility] CARE PROVIDER KNOW ABOUT:  Any allergies you have.  All medicines you are taking, including vitamins, herbs, eye drops, creams, and over-the-counter medicines.  Previous problems you or members of your family have had with the use of anesthetics.  Any blood disorders you have.  Previous surgeries you have had.  Medical conditions you have.  RISKS AND COMPLICATIONS Generally, this is a safe procedure. However, as with  any procedure, problems can occur. Possible problems include:   Serious chest pain.  Rapid heartbeat.  Sensation of warmth in your chest. This usually passes quickly. BEFORE THE PROCEDURE Ask your health care provider about changing or stopping your regular medicines. PROCEDURE This procedure is usually done at a hospital and takes 2-4 hours.  An IV tube is inserted into one of your veins.  Your health care provider will inject a small amount of radioactive tracer through the tube.  You will then wait for 20-40 minutes while the tracer travels through your bloodstream.  You will lie down on an exam table so images of your heart can be taken. Images will be taken for about 15-20 minutes.  You will exercise on a treadmill or stationary bike. While you exercise, your heart activity will be monitored with an electrocardiogram (ECG), and your blood pressure will be checked.  If you are unable to exercise, you may be given a medicine to make your heart beat faster.  When blood flow to your heart has peaked, tracer will again be injected through the IV tube.  After 20-40 minutes, you will get back on the exam table and have more images taken of your heart.  When the procedure is over, your IV tube will be removed. AFTER THE PROCEDURE  You will likely be able to leave shortly after the test. Unless your health care provider tells you otherwise, you may return to your normal schedule, including diet, activities, and medicines.  Make sure you find out how and when you will get your test results.   This information is not intended to replace advice given to you by your health care provider. Make sure you discuss any questions you have with your health care provider.   Document Released: 05/06/2004 Document Revised: 04/16/2013 Document Reviewed: 03/20/2013 Elsevier Interactive Patient Education [employer].

## 2015-11-05 NOTE — Progress Notes (Signed)
Cardiology Office Note   Date:  11/05/2015   ID:  Bailey Ayala, DOB [birth year], MRN [id number]  PCP:  Golden Pop, MD  Cardiologist:   Kathlyn Sacramento, MD   Chief Complaint  Patient presents with  . other      History of Present Illness: Bailey Ayala is a 75 y.o. female who was referred by Dr. Jeananne Rama for evaluation of exertional dyspnea and fatigue. I took care of her husband for many years before he died few years ago from cardiovascular disease. The patient has no previous cardiac history but has known history of peripheral arterial disease with intermittent claudication, previous tobacco use, hyperlipidemia, COPD and hypertension. Her symptoms included exertional dyspnea with moderate activity without chest pain. She complains of frequent fatigue with no previous ischemic cardiac evaluation. Her biggest issue seems to be left calf claudication after walking less than one block. She has been seen by Dr. Jamal Collin for this. ABI in February was mildly reduced on the right of 0.89 and moderately reduced on the left at 0.59. The patient has decreased her physical activities gradually mostly due to claudication and fatigue.     Past Medical History  Diagnosis Date  . Arthritis   . Diffuse cystic mastopathy   . Hyperlipidemia   . Hypertension   . TIA (transient ischemic attack) 2016    left hand weakness  . L4-L5 disc bulge   . Thyroid disease   . PAD (peripheral artery disease) [facility])     Past Surgical History  Procedure Laterality Date  . Abdominal hysterectomy    . Tonsillectomy    . Vein closure procedure Left 2010  . Colonoscopy  1998  . Knee surgery Left 2012  . Back surgery  2011  . Eye surgery Bilateral 05/06/14  . Joint replacement    . Spine surgery    . Parathyroid transplant       Current Outpatient Prescriptions  Medication Sig Dispense Refill  . benazepril (LOTENSIN) 20 MG tablet Take 1 tablet (20 mg total) by mouth daily. 30 tablet 5  .  levothyroxine (SYNTHROID, LEVOTHROID) 112 MCG tablet Take 1 tablet (112 mcg total) by mouth daily. 30 tablet 5  . mometasone (ELOCON) 0.1 % cream Apply 1 application topically daily. (Patient taking differently: Apply 1 application topically as needed. ) 45 g 0  . Multiple Vitamins-Minerals (CENTRUM SILVER ULTRA WOMENS PO) Take by mouth.    . rosuvastatin (CRESTOR) 10 MG tablet Take 1 tablet (10 mg total) by mouth daily. 30 tablet 3   No current facility-administered medications for this visit.    Allergies:   Codeine and Simvastatin    Social History:  The patient  reports that she quit smoking about 26 years ago. She has never used smokeless tobacco. She reports that she drinks alcohol. She reports that she does not use illicit drugs.   Family History:  The patient's family history includes Arthritis in her mother; Heart disease in her father and mother; Stroke in her mother.    ROS:  Please see the history of present illness.   Otherwise, review of systems are positive for none.   All other systems are reviewed and negative.    PHYSICAL EXAM: VS:  BP 135/80 mmHg  Pulse 69  Ht 5\' 4"  (1.626 m)  Wt 189 lb 12 oz (86.07 kg)  BMI 32.55 kg/m2 , BMI Body mass index is 32.55 kg/(m^2). GEN: Well nourished, well developed, in no acute distress HEENT: normal Neck:  no JVD, carotid bruits, or masses Cardiac: RRR; no murmurs, rubs, or gallops,no edema  Respiratory:  clear to auscultation bilaterally, normal work of breathing GI: soft, nontender, nondistended, + BS MS: no deformity or atrophy Skin: warm and dry, no rash Neuro:  Strength and sensation are intact Psych: euthymic mood, full affect   EKG:  EKG is not ordered today. I reviewed her EKG done in June by Dr. Jeananne Rama which showed normal sinus rhythmwith no significant ST or T wave changes. There was borderline at axis deviation.   Recent Labs: 01/13/2015: ALT (SGPT) [place], [place] 20 09/29/2015: BUN 25; Creatinine, Ser 1.27*;  Potassium 4.9; Sodium 139; TSH 1.020    Lipid Panel    Component Value Date/Time   CHOL 223* 07/28/2015 1612   CHOL 211* 01/13/2015 0955   TRIG 402* 07/28/2015 1612   HDL 38* 07/28/2015 1612   LDLCALC Comment 07/28/2015 1612      Wt Readings from Last 3 Encounters:  11/05/15 189 lb 12 oz (86.07 kg)  09/29/15 192 lb (87.091 kg)  07/28/15 193 lb (87.544 kg)      ASSESSMENT AND PLAN:  1.  Exertional dyspnea: The patient has known history of peripheral arterial disease and multiple risk factors for atherosclerosis. Thus, we have to exclude underlying ischemic heart disease. I requested a pharmacologic nuclear stress test for evaluation. She is not able to exercise on a treadmill due to arthritis and claudication.  2. Peripheral arterial disease with severe left calf claudication: Her symptoms are lifestyle limiting. Claudication seems to be the main reason for her decreased physical activities and if her disease is amenable to endovascular intervention, she might benefit in order to increase her physical activities. I requested lower extremity arterial duplex to see the nature of her disease. The patient is hesitant about taking aspirin daily given that she takes ibuprofen for arthritis. She reports that it is the only medication that works for her arthritis.  3. Hyperlipidemia:I reviewed most recent lipid profile in April which showed a total cholesterol 223, triglyceride of 402, HDL of 38. There is a strong indication for treatment with a statin and the patient has been hesitant in the past. She reports having an upset stomach with one of the statins many years ago but she has not been on a statin and a while. After discussion, I started rosuvastatin 10 mg daily. Repeat labs in 6 weeks.  4. Essential hypertension: Blood pressure is controlled on current medication.       Disposition:   FU with me in 6 weeks  Signed, Kathlyn Sacramento, MD  11/05/2015 2:59 PM    [place]

## 2015-11-06 ENCOUNTER — Ambulatory Visit: Payer: No Typology Code available for payment source | Admitting: Cardiovascular Disease

## 2015-11-06 ENCOUNTER — Telehealth: Payer: Self-pay | Admitting: Cardiovascular Disease

## 2015-11-06 NOTE — Telephone Encounter (Signed)
Reviewed myoview instructions w/pt who verbalized understanding with no questions at this time.

## 2015-11-09 ENCOUNTER — Telehealth: Payer: Self-pay | Admitting: Cardiovascular Disease

## 2015-11-09 ENCOUNTER — Ambulatory Visit
Admission: RE | Admit: 2015-11-09 | Discharge: 2015-11-09 | Disposition: A | Discharge status: Home / Self Care | Payer: Medicare Other | Source: Ambulatory Visit | Attending: Cardiovascular Disease | Admitting: Cardiovascular Disease

## 2015-11-09 DIAGNOSIS — R0602 Shortness of breath: Secondary | ICD-10-CM

## 2015-11-09 DIAGNOSIS — M25612 Stiffness of left shoulder, not elsewhere classified: Secondary | ICD-10-CM | POA: Diagnosis not present

## 2015-11-09 DIAGNOSIS — M25512 Pain in left shoulder: Secondary | ICD-10-CM | POA: Diagnosis not present

## 2015-11-09 LAB — NM MYOCAR MULTI W/SPECT W/WALL MOTION / EF
Estimated workload: 1 METS
Exercise duration (min): 0 min
Exercise duration (sec): 0 s
LV dias vol: 74 mL (ref 46–106)
LV sys vol: 37 mL
MPHR: 146 {beats}/min
Peak HR: 110 {beats}/min
Percent HR: 75 %
Rest HR: 85 {beats}/min
SDS: 6
SRS: 4
SSS: 8
TID: 0.72

## 2015-11-09 MED ORDER — TECHNETIUM TC 99M TETROFOSMIN IV KIT
30.0000 | PACK | Freq: Once | INTRAVENOUS | Status: AC | PRN
  Administered 2015-11-09: 29.778 via INTRAVENOUS

## 2015-11-09 MED ORDER — REGADENOSON 0.4 MG/5ML IV SOLN
0.4000 mg | Freq: Once | INTRAVENOUS | Status: AC
  Administered 2015-11-09: 0.4 mg via INTRAVENOUS
  Filled 2015-11-09: qty 5

## 2015-11-09 MED ORDER — TECHNETIUM TC 99M TETROFOSMIN IV KIT
12.4200 | PACK | Freq: Once | INTRAVENOUS | Status: AC | PRN
Start: 2015-11-09 — End: 2015-11-09
  Administered 2015-11-09: 12.42 via INTRAVENOUS

## 2015-11-09 NOTE — Telephone Encounter (Signed)
Patient says she cannot afford new rx for crestor is there a cheaper alternative or do we know of somewhere she can get it cheaper than [facility].

## 2015-11-09 NOTE — Telephone Encounter (Signed)
S/w pt who states she is unable to afford $45 copay for newly prescribed rosuvastatin.  [facility] has $19.48/mth coupon but pt is unable to afford either.  She does not qualify for Medication Management Assistance through [facility]. Requests another statin be prescribed. She had a prescription for atorvastatin from Dr. Jeananne Rama but doesn't think she ever took it. She has an allergy to simvastatin. Lovastatin 10mg  and 20mg  is on $4 [facility] prescription list therefore she asks if that can be prescribed. Forward to MD to review and advise.

## 2015-11-11 ENCOUNTER — Other Ambulatory Visit: Payer: Self-pay

## 2015-11-11 DIAGNOSIS — M25512 Pain in left shoulder: Secondary | ICD-10-CM | POA: Diagnosis not present

## 2015-11-11 DIAGNOSIS — M25612 Stiffness of left shoulder, not elsewhere classified: Secondary | ICD-10-CM | POA: Diagnosis not present

## 2015-11-11 NOTE — Telephone Encounter (Signed)
Switch to Atorvastatin 20 mg daily.

## 2015-11-11 NOTE — Telephone Encounter (Signed)
Left message on machine for patient to contact the office.   

## 2015-11-11 NOTE — Telephone Encounter (Signed)
Please call patient back re med change .

## 2015-11-12 ENCOUNTER — Other Ambulatory Visit: Payer: Self-pay

## 2015-11-12 MED ORDER — ATORVASTATIN CALCIUM 20 MG PO TABS: 20.0000 mg | ORAL_TABLET | Freq: Every day | ORAL | Status: DC

## 2015-11-12 NOTE — Telephone Encounter (Signed)
S/w pt of MD recommendations to change to atorvastatin 20mg  qd. Through the Goodrx.com, pt can get medication for $8.69/month. She states this is affordable for her and provided website information to patient who will print coupon and pick up prescription at [facility], [place]. Pt had no further questions at this time. Prescription submitted.

## 2015-11-16 DIAGNOSIS — M25612 Stiffness of left shoulder, not elsewhere classified: Secondary | ICD-10-CM | POA: Diagnosis not present

## 2015-11-16 DIAGNOSIS — M25512 Pain in left shoulder: Secondary | ICD-10-CM | POA: Diagnosis not present

## 2015-11-17 DIAGNOSIS — M19012 Primary osteoarthritis, left shoulder: Secondary | ICD-10-CM | POA: Diagnosis not present

## 2015-11-30 ENCOUNTER — Other Ambulatory Visit: Payer: Self-pay | Admitting: Cardiovascular Disease

## 2015-11-30 DIAGNOSIS — I739 Peripheral vascular disease, unspecified: Secondary | ICD-10-CM

## 2015-12-07 ENCOUNTER — Other Ambulatory Visit: Payer: Self-pay | Admitting: Family Medicine

## 2015-12-07 ENCOUNTER — Telehealth: Payer: Self-pay | Admitting: Family Medicine

## 2015-12-07 DIAGNOSIS — E039 Hypothyroidism, unspecified: Secondary | ICD-10-CM

## 2015-12-07 MED ORDER — LEVOTHYROXINE SODIUM 112 MCG PO TABS: 112.0000 ug | ORAL_TABLET | Freq: Every day | ORAL | 5 refills | Status: DC

## 2015-12-07 NOTE — Telephone Encounter (Signed)
Pharmacist called stating the prescription for Levothyroxine must say ok to use Levothyroxine rather than synthroid.  It has to be noted on the prescription that this is ok.  Please call pharmacy with any questions.

## 2015-12-09 ENCOUNTER — Ambulatory Visit: Payer: Medicare Other

## 2015-12-09 DIAGNOSIS — I739 Peripheral vascular disease, unspecified: Secondary | ICD-10-CM | POA: Diagnosis not present

## 2015-12-11 ENCOUNTER — Telehealth: Payer: Self-pay | Admitting: Family Medicine

## 2015-12-11 ENCOUNTER — Other Ambulatory Visit: Payer: Self-pay | Admitting: Family Medicine

## 2015-12-11 DIAGNOSIS — E039 Hypothyroidism, unspecified: Secondary | ICD-10-CM

## 2015-12-11 NOTE — Telephone Encounter (Signed)
Pt called would like a call back from [place] regarding a 6 week med follow up appointment. No other information provided. Please call pt ASAP.

## 2015-12-11 NOTE — Telephone Encounter (Signed)
Patient's pharmacy did not start the generic thyroid meds as written in June, patient is not going to try the generic until after she has shoulder replacement surgery.  After surgery she will try the generic.

## 2015-12-15 ENCOUNTER — Ambulatory Visit: Payer: Self-pay | Admitting: General Surgery

## 2015-12-17 ENCOUNTER — Ambulatory Visit (INDEPENDENT_AMBULATORY_CARE_PROVIDER_SITE_OTHER): Payer: Medicare Other | Admitting: Cardiovascular Disease

## 2015-12-17 ENCOUNTER — Other Ambulatory Visit (INDEPENDENT_AMBULATORY_CARE_PROVIDER_SITE_OTHER): Payer: Medicare Other

## 2015-12-17 ENCOUNTER — Encounter: Payer: Self-pay | Admitting: Cardiovascular Disease

## 2015-12-17 VITALS — BP 122/70 | HR 92 | Ht 64.5 in | Wt 190.2 lb

## 2015-12-17 DIAGNOSIS — E785 Hyperlipidemia, unspecified: Secondary | ICD-10-CM

## 2015-12-17 DIAGNOSIS — I10 Essential (primary) hypertension: Secondary | ICD-10-CM

## 2015-12-17 DIAGNOSIS — I739 Peripheral vascular disease, unspecified: Secondary | ICD-10-CM | POA: Diagnosis not present

## 2015-12-17 DIAGNOSIS — I779 Disorder of arteries and arterioles, unspecified: Secondary | ICD-10-CM | POA: Diagnosis not present

## 2015-12-17 NOTE — Patient Instructions (Signed)
Medication Instructions: Continue same medications.   Labwork: Labs today.   Procedures/Testing: None.   Follow-Up: 4 months with Dr. Fletcher Anon.   Any Additional Special Instructions Will Be Listed Below (If Applicable).     If you need a refill on your cardiac medications before your next appointment, please call your pharmacy.

## 2015-12-17 NOTE — Progress Notes (Signed)
Cardiology Office Note   Date:  12/17/2015   ID:  ARIAUNA ANDRESEN, DOB 01/19/1941, MRN [id number]  PCP:  Golden Pop, MD  Cardiologist:   Kathlyn Sacramento, MD   Chief Complaint  Patient presents with  . Other    F/u testing. Meds reviewed verbally with pt.      History of Present Illness: YANNI GURA is a 75 y.o. female who Is here today for follow-up visit regarding exertional dyspnea and peripheral arterial disease. She has chronic medical conditions that include peripheral arterial disease with intermittent claudication, previous tobacco use, hyperlipidemia, COPD and hypertension. She was seen recently for exertional dyspnea. She underwent a pharmacologic nuclear stress test which showed no evidence of ischemia with normal ejection fraction. The patient has severe left calf claudication. I proceeded with noninvasive vascular evaluation which showed an ABI of 0.57 on the right and 0.49 on the left. Duplex showed occlusion of the proximal to mid left SFA and moderate to borderline significant right SFA disease. Claudication happens after walking less than half a block. She also describes cramps at night. No lower extremity ulcerations or wounds. During last visit, she was started on rosuvastatin but was then switched to atorvastatin for cost reasons.    Past Medical History:  Diagnosis Date  . Arthritis   . Diffuse cystic mastopathy   . Hyperlipidemia   . Hypertension   . L4-L5 disc bulge   . PAD (peripheral artery disease) ([place])   . Thyroid disease   . TIA (transient ischemic attack) 2016   left hand weakness    Past Surgical History:  Procedure Laterality Date  . ABDOMINAL HYSTERECTOMY    . BACK SURGERY  2011  . COLONOSCOPY  1998  . EYE SURGERY Bilateral 05/06/14  . JOINT REPLACEMENT    . KNEE SURGERY Left 2012  . parathyroid transplant    . SPINE SURGERY    . TONSILLECTOMY    . vein closure procedure Left 2010     Current Outpatient Prescriptions    Medication Sig Dispense Refill  . atorvastatin (LIPITOR) 20 MG tablet Take 1 tablet (20 mg total) by mouth daily. 30 tablet 3  . benazepril (LOTENSIN) 20 MG tablet Take 1 tablet (20 mg total) by mouth daily. 30 tablet 5  . levothyroxine (SYNTHROID, LEVOTHROID) 112 MCG tablet Take 1 tablet (112 mcg total) by mouth daily. 30 tablet 5  . mometasone (ELOCON) 0.1 % cream Apply 1 application topically daily. (Patient taking differently: Apply 1 application topically as needed. ) 45 g 0  . Multiple Vitamins-Minerals (CENTRUM SILVER ULTRA WOMENS PO) Take by mouth.     No current facility-administered medications for this visit.     Allergies:   Codeine and Simvastatin    Social History:  The patient  reports that she quit smoking about 26 years ago. She has never used smokeless tobacco. She reports that she drinks alcohol. She reports that she does not use drugs.   Family History:  The patient's family history includes Arthritis in her mother; Heart disease in her father and mother; Stroke in her mother.    ROS:  Please see the history of present illness.   Otherwise, review of systems are positive for none.   All other systems are reviewed and negative.    PHYSICAL EXAM: VS:  BP 122/70 (BP Location: Left Arm, Patient Position: Sitting, Cuff Size: Normal)   Pulse 92   Ht 5' 4.5" (1.638 m)   Wt 190 lb 4  oz (86.3 kg)   BMI 32.15 kg/m  , BMI Body mass index is 32.15 kg/m. GEN: Well nourished, well developed, in no acute distress  HEENT: normal  Neck: no JVD, carotid bruits, or masses Cardiac: RRR; no murmurs, rubs, or gallops,no edema  Respiratory:  clear to auscultation bilaterally, normal work of breathing GI: soft, nontender, nondistended, + BS MS: no deformity or atrophy  Skin: warm and dry, no rash Neuro:  Strength and sensation are intact Psych: euthymic mood, full affect   EKG:  EKG is not ordered today.    Recent Labs: 01/13/2015: ALT (SGPT) [place]col[place], [place] 20 09/29/2015:  BUN 25; Creatinine, Ser 1.27; Potassium 4.9; Sodium 139; TSH 1.020    Lipid Panel    Component Value Date/Time   CHOL 223 (H) 07/28/2015 1612   CHOL 211 (H) 01/13/2015 0955   TRIG 402 (H) 07/28/2015 1612   HDL 38 (L) 07/28/2015 1612   [place] Comment 07/28/2015 1612      Wt Readings from Last 3 Encounters:  12/17/15 190 lb 4 oz (86.3 kg)  11/05/15 189 lb 12 oz (86.1 kg)  09/29/15 192 lb (87.1 kg)      ASSESSMENT AND PLAN:  1.  Exertional dyspnea:  Likely due to physical deconditioning in the setting of decreased physical activities related to claudication. Recent pharmacologic nuclear stress test was unremarkable.   2. Peripheral arterial disease with severe left calf claudication:  ABI was moderately to severely reduced bilaterally worse on the left side. The left SFA was noted to be occluded. I discussed with her management options for claudication including attempted a walking program versus proceeding with angiography and possible endovascular intervention. She wants to try the walking program for few months and then we will reevaluate her symptoms.  The patient is hesitant about taking aspirin daily given that she takes ibuprofen for arthritis. She reports that it is the only medication that works for her arthritis.  3. Hyperlipidemia: The patient was started recently on atorvastatin. I recommend a target LDL of less than 70. Repeat fasting lipid and liver profile today.  4. Essential hypertension: Blood pressure is controlled on current medication.    Disposition:   FU with me in 4 months.  Signed, Kathlyn Sacramento, MD  12/17/2015 8:49 AM    [facility]

## 2015-12-18 LAB — HEPATIC FUNCTION PANEL
ALT: 18 IU/L (ref 0–32)
AST: 23 IU/L (ref 0–40)
Albumin: 4.5 g/dL (ref 3.5–4.8)
Alkaline Phosphatase: 101 IU/L (ref 39–117)
Bilirubin Total: 0.3 mg/dL (ref 0.0–1.2)
Bilirubin, Direct: 0.07 mg/dL (ref 0.00–0.40)
Total Protein: 7.4 g/dL (ref 6.0–8.5)

## 2015-12-18 LAB — LIPID PANEL
Chol/HDL Ratio: 4 ratio units (ref 0.0–4.4)
Cholesterol, Total: 170 mg/dL (ref 100–199)
HDL: 43 mg/dL (ref >39)
LDL Calculated: 84 mg/dL (ref 0–99)
Triglycerides: 213 mg/dL — ABNORMAL HIGH (ref 0–149)
VLDL Cholesterol Cal: 43 mg/dL — ABNORMAL HIGH (ref 5–40)

## 2015-12-23 DIAGNOSIS — M19012 Primary osteoarthritis, left shoulder: Secondary | ICD-10-CM | POA: Diagnosis not present

## 2015-12-24 ENCOUNTER — Telehealth: Payer: Self-pay | Admitting: Cardiovascular Disease

## 2015-12-24 NOTE — Telephone Encounter (Signed)
Received request for cardiac clearance for Left shoulder:TSA - total shoulder from [facility]. Date: TBD. Fax: [phone]

## 2015-12-25 NOTE — Telephone Encounter (Signed)
Faxed cardiac clearance for left shoulder TSA to [facility] @ [place], [phone]

## 2016-01-12 ENCOUNTER — Other Ambulatory Visit (HOSPITAL_COMMUNITY): Payer: Medicare Other

## 2016-01-12 ENCOUNTER — Encounter (HOSPITAL_COMMUNITY)
Admission: RE | Admit: 2016-01-12 | Discharge: 2016-01-12 | Disposition: A | Discharge status: Home / Self Care | Payer: Medicare Other | Source: Ambulatory Visit | Attending: Orthopedic Surgery | Admitting: Orthopedic Surgery

## 2016-01-12 ENCOUNTER — Encounter (HOSPITAL_COMMUNITY): Payer: Self-pay

## 2016-01-12 HISTORY — DX: Cerebral infarction, unspecified: I63.9

## 2016-01-12 HISTORY — DX: Chronic obstructive pulmonary disease, unspecified: J44.9

## 2016-01-12 HISTORY — DX: Reserved for inherently not codable concepts without codable children: IMO0001

## 2016-01-12 HISTORY — DX: Disease of blood and blood-forming organs, unspecified: D75.9

## 2016-01-12 LAB — CBC
HCT: 35.2 % — ABNORMAL LOW (ref 36.0–46.0)
Hemoglobin: 11.3 g/dL — ABNORMAL LOW (ref 12.0–15.0)
MCH: 32.8 pg (ref 26.0–34.0)
MCHC: 32.1 g/dL (ref 30.0–36.0)
MCV: 102.3 fL — ABNORMAL HIGH (ref 78.0–100.0)
Platelets: 199 10*3/uL (ref 150–400)
RBC: 3.44 MIL/uL — ABNORMAL LOW (ref 3.87–5.11)
RDW: 13.4 % (ref 11.5–15.5)
WBC: 5.4 10*3/uL (ref 4.0–10.5)

## 2016-01-12 LAB — BASIC METABOLIC PANEL
Anion gap: 12 (ref 5–15)
BUN: 27 mg/dL — ABNORMAL HIGH (ref 6–20)
CO2: 18 mmol/L — ABNORMAL LOW (ref 22–32)
Calcium: 9.2 mg/dL (ref 8.9–10.3)
Chloride: 107 mmol/L (ref 101–111)
Creatinine, Ser: 1.33 mg/dL — ABNORMAL HIGH (ref 0.44–1.00)
GFR calc Af Amer: 44 mL/min — ABNORMAL LOW (ref >60)
GFR calc non Af Amer: 38 mL/min — ABNORMAL LOW (ref >60)
Glucose, Bld: 91 mg/dL (ref 65–99)
Potassium: 4.5 mmol/L (ref 3.5–5.1)
Sodium: 137 mmol/L (ref 135–145)

## 2016-01-12 LAB — SURGICAL PCR SCREEN
MRSA, PCR: NEGATIVE
Staphylococcus aureus: NEGATIVE

## 2016-01-12 NOTE — Pre-Procedure Instructions (Signed)
Bailey Ayala  01/12/2016      [facility], [place] - [address] [place] [address] Phone: [phone] Fax: [phone]  [facility] [place], [place] AT [address] [place] [address] Phone: [phone] Fax: [phone]    Your procedure is scheduled on  Thursday 01/14/16  Report to [place] at 800 A.M.  Call this number if you have problems the morning of surgery:  [phone]   Remember:  Do not eat food or drink liquids after midnight.  Take these medicines the morning of surgery with A SIP OF WATER   TYLENOL IF NEEDED, LEVOTHYROXINE       (STOP 7 DAYS PRIOR TO SURGERY- ASPIRIN OR ASPIRIN PRODUCTS, ADVIL/ MOTRIN/ IBUPROFEN/ ALEVE, GOODY POWDERS/ BC'S, HERBAL MEDICINES, CENTRUM SILVER (MULTIVITAMIN)   Do not wear jewelry, make-up or nail polish.  Do not wear lotions, powders, or perfumes, or deoderant.  Do not shave 48 hours prior to surgery.  Men may shave face and neck.  Do not bring valuables to the hospital.  [facility] is not responsible for any belongings or valuables.  Contacts, dentures or bridgework may not be worn into surgery.  Leave your suitcase in the car.  After surgery it may be brought to your room.  For patients admitted to the hospital, discharge time will be determined by your treatment team.  Patients discharged the day of surgery will not be allowed to drive home.   Name and phone number of your driver:    Special instructions:  [facility] - Preparing for Surgery  Before surgery, you can play an important role.  Because skin is not sterile, your skin needs to be as free of germs as possible.  You can reduce the number of germs on you skin by washing with CHG (chlorahexidine gluconate) soap before surgery.  CHG is an antiseptic cleaner which kills germs and bonds with the skin to continue killing  germs even after washing.  Please DO NOT use if you have an allergy to CHG or antibacterial soaps.  If your skin becomes reddened/irritated stop using the CHG and inform your nurse when you arrive at Short Stay.  Do not shave (including legs and underarms) for at least 48 hours prior to the first CHG shower.  You may shave your face.  Please follow these instructions carefully:   1.  Shower with CHG Soap the night before surgery and the                                morning of Surgery.  2.  If you choose to wash your hair, wash your hair first as usual with your       normal shampoo.  3.  After you shampoo, rinse your hair and body thoroughly to remove the                      Shampoo.  4.  Use CHG as you would any other liquid soap.  You can apply chg directly       to the skin and wash gently with scrungie or a clean washcloth.  5.  Apply the CHG Soap to your body ONLY FROM THE NECK DOWN.  Do not use on open wounds or open sores.  Avoid contact with your eyes,       ears, mouth and genitals (private parts).  Wash genitals (private parts)       with your normal soap.  6.  Wash thoroughly, paying special attention to the area where your surgery        will be performed.  7.  Thoroughly rinse your body with warm water from the neck down.  8.  DO NOT shower/wash with your normal soap after using and rinsing off       the CHG Soap.  9.  Pat yourself dry with a clean towel.            10.  Wear clean pajamas.            11.  Place clean sheets on your bed the night of your first shower and do not        sleep with pets.  Day of Surgery  Do not apply any lotions/deoderants the morning of surgery.  Please wear clean clothes to the hospital/surgery center.    Please read over the following fact sheets that you were given. MRSA Information and Surgical Site Infection Prevention

## 2016-01-13 MED ORDER — CEFAZOLIN SODIUM-DEXTROSE 2-4 GM/100ML-% IV SOLN
2.0000 g | INTRAVENOUS | Status: AC
  Administered 2016-01-14: 2 g via INTRAVENOUS
  Filled 2016-01-13: qty 100

## 2016-01-13 MED ORDER — TRANEXAMIC ACID 1000 MG/10ML IV SOLN
1000.0000 mg | INTRAVENOUS | Status: AC
  Administered 2016-01-14: 1000 mg via INTRAVENOUS
  Filled 2016-01-13: qty 10

## 2016-01-13 NOTE — Anesthesia Preprocedure Evaluation (Addendum)
Anesthesia Evaluation  Patient identified by MRN, date of birth, ID band Patient awake    Reviewed: Allergy & Precautions, H&P , NPO status , Patient's Chart, lab work & pertinent test results  History of Anesthesia Complications Negative for: history of anesthetic complications  Airway Mallampati: II  TM Distance: >3 FB Neck ROM: Full    Dental  (+) Upper Dentures, Partial Upper   Pulmonary neg shortness of breath, neg sleep apnea, COPD, neg recent URI, former smoker,    Pulmonary exam normal breath sounds clear to auscultation       Cardiovascular hypertension, Pt. on medications (-) angina+ Peripheral Vascular Disease and + DOE  (-) Past MI, (-) Cardiac Stents, (-) CABG and (-) Orthopnea  Rhythm:Regular Rate:Normal  HLD   Neuro/Psych neg Seizures Bulging disc at L4-5 TIA (2016)   GI/Hepatic negative GI ROS, Neg liver ROS,   Endo/Other  neg diabetesHypothyroidism   Renal/GU CRFRenal disease     Musculoskeletal  (+) Arthritis ,   Abdominal (+) + obese,   Peds  Hematology  (+) Blood dyscrasia (bleeds easily), ,   Anesthesia Other Findings   Reproductive/Obstetrics                           Anesthesia Physical Anesthesia Plan  ASA: III  Anesthesia Plan: General and Regional   Post-op Pain Management: GA combined w/ Regional for post-op pain   Induction: Intravenous  Airway Management Planned: Oral ETT  Additional Equipment:   Intra-op Plan:   Post-operative Plan: Extubation in OR  Informed Consent: I have reviewed the patients History and Physical, chart, labs and discussed the procedure including the risks, benefits and alternatives for the proposed anesthesia with the patient or authorized representative who has indicated his/her understanding and acceptance.   Dental advisory given  Plan Discussed with: CRNA, Anesthesiologist and Surgeon  Anesthesia Plan Comments: (Risks  of general anesthesia discussed including, but not limited to, sore throat, hoarse voice, chipped/damaged teeth, injury to vocal cords, nausea and vomiting, allergic reactions, lung infection, heart attack, stroke, and death. All questions answered.  Discussed potential risks of nerve blocks including, but not limited to, infection, bleeding, nerve damage, seizures, pneumothorax, respiratory depression, and potential failure of the block. Alternatives to nerve blocks discussed. All questions answered. )      Anesthesia Quick Evaluation

## 2016-01-14 ENCOUNTER — Encounter (HOSPITAL_COMMUNITY): Payer: Self-pay | Admitting: *Deleted

## 2016-01-14 ENCOUNTER — Inpatient Hospital Stay (HOSPITAL_COMMUNITY)
Admission: RE | Admit: 2016-01-14 | Discharge: 2016-01-15 | DRG: 483 | Disposition: A | Discharge status: Home Health | Payer: Medicare Other | Source: Ambulatory Visit | Attending: Orthopedic Surgery | Admitting: Orthopedic Surgery

## 2016-01-14 ENCOUNTER — Inpatient Hospital Stay (HOSPITAL_COMMUNITY): Payer: Medicare Other | Admitting: Anesthesiology

## 2016-01-14 ENCOUNTER — Encounter (HOSPITAL_COMMUNITY)
Admission: RE | Disposition: A | Discharge status: Home Health | Payer: Self-pay | Source: Ambulatory Visit | Attending: Orthopedic Surgery

## 2016-01-14 DIAGNOSIS — I129 Hypertensive chronic kidney disease with stage 1 through stage 4 chronic kidney disease, or unspecified chronic kidney disease: Secondary | ICD-10-CM | POA: Diagnosis not present

## 2016-01-14 DIAGNOSIS — I739 Peripheral vascular disease, unspecified: Secondary | ICD-10-CM | POA: Diagnosis not present

## 2016-01-14 DIAGNOSIS — I10 Essential (primary) hypertension: Secondary | ICD-10-CM | POA: Diagnosis present

## 2016-01-14 DIAGNOSIS — Z8673 Personal history of transient ischemic attack (TIA), and cerebral infarction without residual deficits: Secondary | ICD-10-CM | POA: Diagnosis not present

## 2016-01-14 DIAGNOSIS — Z87891 Personal history of nicotine dependence: Secondary | ICD-10-CM | POA: Diagnosis not present

## 2016-01-14 DIAGNOSIS — M19012 Primary osteoarthritis, left shoulder: Secondary | ICD-10-CM | POA: Diagnosis not present

## 2016-01-14 DIAGNOSIS — E669 Obesity, unspecified: Secondary | ICD-10-CM | POA: Diagnosis present

## 2016-01-14 DIAGNOSIS — Z96652 Presence of left artificial knee joint: Secondary | ICD-10-CM | POA: Diagnosis not present

## 2016-01-14 DIAGNOSIS — D759 Disease of blood and blood-forming organs, unspecified: Secondary | ICD-10-CM | POA: Diagnosis present

## 2016-01-14 DIAGNOSIS — E785 Hyperlipidemia, unspecified: Secondary | ICD-10-CM | POA: Diagnosis not present

## 2016-01-14 DIAGNOSIS — Z79899 Other long term (current) drug therapy: Secondary | ICD-10-CM | POA: Diagnosis not present

## 2016-01-14 DIAGNOSIS — N189 Chronic kidney disease, unspecified: Secondary | ICD-10-CM | POA: Diagnosis not present

## 2016-01-14 DIAGNOSIS — Z8249 Family history of ischemic heart disease and other diseases of the circulatory system: Secondary | ICD-10-CM

## 2016-01-14 DIAGNOSIS — Z96619 Presence of unspecified artificial shoulder joint: Secondary | ICD-10-CM

## 2016-01-14 DIAGNOSIS — Z823 Family history of stroke: Secondary | ICD-10-CM

## 2016-01-14 DIAGNOSIS — Z6832 Body mass index (BMI) 32.0-32.9, adult: Secondary | ICD-10-CM | POA: Diagnosis not present

## 2016-01-14 DIAGNOSIS — J449 Chronic obstructive pulmonary disease, unspecified: Secondary | ICD-10-CM | POA: Diagnosis present

## 2016-01-14 DIAGNOSIS — Z9489 Other transplanted organ and tissue status: Secondary | ICD-10-CM | POA: Diagnosis not present

## 2016-01-14 DIAGNOSIS — Z8261 Family history of arthritis: Secondary | ICD-10-CM | POA: Diagnosis not present

## 2016-01-14 DIAGNOSIS — E890 Postprocedural hypothyroidism: Secondary | ICD-10-CM | POA: Diagnosis present

## 2016-01-14 HISTORY — PX: TOTAL SHOULDER ARTHROPLASTY: SHX126

## 2016-01-14 SURGERY — ARTHROPLASTY, SHOULDER, TOTAL
Anesthesia: Regional | Site: Shoulder | Laterality: Left

## 2016-01-14 MED ORDER — ALUM & MAG HYDROXIDE-SIMETH 200-200-20 MG/5ML PO SUSP: 30.0000 mL | ORAL | Status: DC | PRN

## 2016-01-14 MED ORDER — 0.9 % SODIUM CHLORIDE (POUR BTL) OPTIME
TOPICAL | Status: DC | PRN
  Administered 2016-01-14: 1000 mL

## 2016-01-14 MED ORDER — METOCLOPRAMIDE HCL 10 MG PO TABS: 5.0000 mg | ORAL_TABLET | Freq: Three times a day (TID) | ORAL | Status: DC | PRN

## 2016-01-14 MED ORDER — PROPOFOL 10 MG/ML IV BOLUS
INTRAVENOUS | Status: DC | PRN
  Administered 2016-01-14: 120 mg via INTRAVENOUS

## 2016-01-14 MED ORDER — PHENOL 1.4 % MT LIQD: 1.0000 | OROMUCOSAL | Status: DC | PRN

## 2016-01-14 MED ORDER — OXYCODONE HCL 5 MG PO TABS
5.0000 mg | ORAL_TABLET | Freq: Once | ORAL | Status: AC | PRN
  Administered 2016-01-14: 5 mg via ORAL

## 2016-01-14 MED ORDER — OXYCODONE HCL 5 MG/5ML PO SOLN: 5.0000 mg | Freq: Once | ORAL | Status: AC | PRN

## 2016-01-14 MED ORDER — BENAZEPRIL HCL 20 MG PO TABS
20.0000 mg | ORAL_TABLET | Freq: Every day | ORAL | Status: DC
  Administered 2016-01-15: 20 mg via ORAL
  Filled 2016-01-14: qty 1

## 2016-01-14 MED ORDER — POLYETHYLENE GLYCOL 3350 17 G PO PACK: 17.0000 g | PACK | Freq: Every day | ORAL | Status: DC | PRN

## 2016-01-14 MED ORDER — CEFAZOLIN SODIUM-DEXTROSE 2-4 GM/100ML-% IV SOLN
2.0000 g | Freq: Four times a day (QID) | INTRAVENOUS | Status: AC
  Administered 2016-01-14 – 2016-01-15 (×3): 2 g via INTRAVENOUS
  Filled 2016-01-14 (×3): qty 100

## 2016-01-14 MED ORDER — DIAZEPAM 5 MG PO TABS
2.5000 mg | ORAL_TABLET | Freq: Four times a day (QID) | ORAL | Status: DC | PRN
  Administered 2016-01-15: 5 mg via ORAL
  Filled 2016-01-14: qty 1

## 2016-01-14 MED ORDER — METOCLOPRAMIDE HCL 5 MG/ML IJ SOLN
5.0000 mg | Freq: Three times a day (TID) | INTRAMUSCULAR | Status: DC | PRN
  Filled 2016-01-14: qty 2

## 2016-01-14 MED ORDER — DEXTROSE 5 % IV SOLN
INTRAVENOUS | Status: DC | PRN
  Administered 2016-01-14: 25 ug/min via INTRAVENOUS

## 2016-01-14 MED ORDER — LEVOTHYROXINE SODIUM 112 MCG PO TABS
112.0000 ug | ORAL_TABLET | Freq: Every day | ORAL | Status: DC
  Administered 2016-01-15: 112 ug via ORAL
  Filled 2016-01-14: qty 1

## 2016-01-14 MED ORDER — SUGAMMADEX SODIUM 200 MG/2ML IV SOLN
INTRAVENOUS | Status: AC
  Filled 2016-01-14: qty 2

## 2016-01-14 MED ORDER — ONDANSETRON HCL 4 MG PO TABS: 4.0000 mg | ORAL_TABLET | Freq: Four times a day (QID) | ORAL | Status: DC | PRN

## 2016-01-14 MED ORDER — ONDANSETRON HCL 4 MG/2ML IJ SOLN
INTRAMUSCULAR | Status: DC | PRN
  Administered 2016-01-14: 4 mg via INTRAVENOUS

## 2016-01-14 MED ORDER — OXYCODONE HCL 5 MG PO TABS
5.0000 mg | ORAL_TABLET | ORAL | Status: DC | PRN
  Administered 2016-01-14: 10 mg via ORAL
  Administered 2016-01-14: 5 mg via ORAL
  Administered 2016-01-15 (×3): 10 mg via ORAL
  Filled 2016-01-14 (×5): qty 2

## 2016-01-14 MED ORDER — PROPOFOL 10 MG/ML IV BOLUS
INTRAVENOUS | Status: AC
  Filled 2016-01-14: qty 20

## 2016-01-14 MED ORDER — ACETAMINOPHEN 325 MG PO TABS
650.0000 mg | ORAL_TABLET | Freq: Four times a day (QID) | ORAL | Status: DC | PRN
Start: 2016-01-14 — End: 2016-01-15

## 2016-01-14 MED ORDER — FENTANYL CITRATE (PF) 100 MCG/2ML IJ SOLN
INTRAMUSCULAR | Status: AC
  Administered 2016-01-14: 50 ug via INTRAVENOUS
  Filled 2016-01-14: qty 2

## 2016-01-14 MED ORDER — ACETAMINOPHEN 650 MG RE SUPP: 650.0000 mg | Freq: Four times a day (QID) | RECTAL | Status: DC | PRN

## 2016-01-14 MED ORDER — ATORVASTATIN CALCIUM 20 MG PO TABS
20.0000 mg | ORAL_TABLET | Freq: Every day | ORAL | Status: DC
  Administered 2016-01-14: 20 mg via ORAL
  Filled 2016-01-14: qty 1

## 2016-01-14 MED ORDER — KETOROLAC TROMETHAMINE 15 MG/ML IJ SOLN
7.5000 mg | Freq: Four times a day (QID) | INTRAMUSCULAR | Status: AC
  Administered 2016-01-14 – 2016-01-15 (×4): 7.5 mg via INTRAVENOUS
  Filled 2016-01-14 (×4): qty 1

## 2016-01-14 MED ORDER — FENTANYL CITRATE (PF) 100 MCG/2ML IJ SOLN
INTRAMUSCULAR | Status: AC
  Filled 2016-01-14: qty 2

## 2016-01-14 MED ORDER — ROCURONIUM BROMIDE 10 MG/ML (PF) SYRINGE
PREFILLED_SYRINGE | INTRAVENOUS | Status: DC | PRN
  Administered 2016-01-14: 30 mg via INTRAVENOUS

## 2016-01-14 MED ORDER — LIDOCAINE 2% (20 MG/ML) 5 ML SYRINGE
INTRAMUSCULAR | Status: AC
  Filled 2016-01-14: qty 5

## 2016-01-14 MED ORDER — KETOROLAC TROMETHAMINE 15 MG/ML IJ SOLN
INTRAMUSCULAR | Status: AC
  Filled 2016-01-14: qty 1

## 2016-01-14 MED ORDER — LIDOCAINE 2% (20 MG/ML) 5 ML SYRINGE
INTRAMUSCULAR | Status: DC | PRN
  Administered 2016-01-14: 40 mg via INTRAVENOUS

## 2016-01-14 MED ORDER — MENTHOL 3 MG MT LOZG: 1.0000 | LOZENGE | OROMUCOSAL | Status: DC | PRN

## 2016-01-14 MED ORDER — FENTANYL CITRATE (PF) 100 MCG/2ML IJ SOLN
25.0000 ug | INTRAMUSCULAR | Status: DC | PRN
  Administered 2016-01-14 (×2): 25 ug via INTRAVENOUS

## 2016-01-14 MED ORDER — ONDANSETRON HCL 4 MG/2ML IJ SOLN
INTRAMUSCULAR | Status: AC
  Filled 2016-01-14: qty 2

## 2016-01-14 MED ORDER — LACTATED RINGERS IV SOLN: INTRAVENOUS | Status: DC

## 2016-01-14 MED ORDER — ONDANSETRON HCL 4 MG/2ML IJ SOLN: 4.0000 mg | Freq: Once | INTRAMUSCULAR | Status: DC | PRN

## 2016-01-14 MED ORDER — MIDAZOLAM HCL 2 MG/2ML IJ SOLN
INTRAMUSCULAR | Status: AC
  Filled 2016-01-14: qty 2

## 2016-01-14 MED ORDER — ONDANSETRON HCL 4 MG/2ML IJ SOLN: 4.0000 mg | Freq: Four times a day (QID) | INTRAMUSCULAR | Status: DC | PRN

## 2016-01-14 MED ORDER — HYDROMORPHONE HCL 1 MG/ML IJ SOLN: 1.0000 mg | INTRAMUSCULAR | Status: DC | PRN

## 2016-01-14 MED ORDER — FENTANYL CITRATE (PF) 100 MCG/2ML IJ SOLN
INTRAMUSCULAR | Status: DC | PRN
  Administered 2016-01-14 (×4): 50 ug via INTRAVENOUS

## 2016-01-14 MED ORDER — SUGAMMADEX SODIUM 200 MG/2ML IV SOLN
INTRAVENOUS | Status: DC | PRN
  Administered 2016-01-14: 200 mg via INTRAVENOUS

## 2016-01-14 MED ORDER — ROCURONIUM BROMIDE 10 MG/ML (PF) SYRINGE
PREFILLED_SYRINGE | INTRAVENOUS | Status: AC
  Filled 2016-01-14: qty 10

## 2016-01-14 MED ORDER — OXYCODONE HCL 5 MG PO TABS
ORAL_TABLET | ORAL | Status: AC
  Filled 2016-01-14: qty 1

## 2016-01-14 MED ORDER — LACTATED RINGERS IV SOLN
INTRAVENOUS | Status: DC
  Administered 2016-01-14 (×2): via INTRAVENOUS

## 2016-01-14 MED ORDER — FLEET ENEMA 7-19 GM/118ML RE ENEM: 1.0000 | ENEMA | Freq: Once | RECTAL | Status: DC | PRN

## 2016-01-14 MED ORDER — CHLORHEXIDINE GLUCONATE 4 % EX LIQD: 60.0000 mL | Freq: Once | CUTANEOUS | Status: DC

## 2016-01-14 MED ORDER — FENTANYL CITRATE (PF) 100 MCG/2ML IJ SOLN
50.0000 ug | Freq: Once | INTRAMUSCULAR | Status: AC
  Administered 2016-01-14: 50 ug via INTRAVENOUS

## 2016-01-14 MED ORDER — DIPHENHYDRAMINE HCL 12.5 MG/5ML PO ELIX
12.5000 mg | ORAL_SOLUTION | ORAL | Status: DC | PRN
  Filled 2016-01-14: qty 10

## 2016-01-14 MED ORDER — FENTANYL CITRATE (PF) 100 MCG/2ML IJ SOLN: 25.0000 ug | INTRAMUSCULAR | Status: DC | PRN

## 2016-01-14 MED ORDER — DOCUSATE SODIUM 100 MG PO CAPS
100.0000 mg | ORAL_CAPSULE | Freq: Two times a day (BID) | ORAL | Status: DC
  Administered 2016-01-14 – 2016-01-15 (×2): 100 mg via ORAL
  Filled 2016-01-14 (×2): qty 1

## 2016-01-14 MED ORDER — BISACODYL 5 MG PO TBEC: 5.0000 mg | DELAYED_RELEASE_TABLET | Freq: Every day | ORAL | Status: DC | PRN

## 2016-01-14 SURGICAL SUPPLY — 58 items
BLADE SAW SGTL 83.5X18.5 (BLADE) ×2 IMPLANT
CEMENT BONE DEPUY (Cement) ×2 IMPLANT
COVER SURGICAL LIGHT HANDLE (MISCELLANEOUS) ×2 IMPLANT
DERMABOND ADHESIVE PROPEN (GAUZE/BANDAGES/DRESSINGS) ×1
DERMABOND ADVANCED .7 DNX6 (GAUZE/BANDAGES/DRESSINGS) ×1 IMPLANT
DRAPE ORTHO SPLIT 77X108 STRL (DRAPES) ×2
DRAPE SURG 17X11 SM STRL (DRAPES) ×2 IMPLANT
DRAPE SURG ORHT 6 SPLT 77X108 (DRAPES) ×2 IMPLANT
DRAPE U-SHAPE 47X51 STRL (DRAPES) ×2 IMPLANT
DRSG AQUACEL AG ADV 3.5X10 (GAUZE/BANDAGES/DRESSINGS) ×2 IMPLANT
DURAPREP 26ML APPLICATOR (WOUND CARE) ×2 IMPLANT
ELECT BLADE 4.0 EZ CLEAN MEGAD (MISCELLANEOUS) ×2
ELECT CAUTERY BLADE 6.4 (BLADE) ×2 IMPLANT
ELECT REM PT RETURN 9FT ADLT (ELECTROSURGICAL) ×2
ELECTRODE BLDE 4.0 EZ CLN MEGD (MISCELLANEOUS) ×1 IMPLANT
ELECTRODE REM PT RTRN 9FT ADLT (ELECTROSURGICAL) ×1 IMPLANT
FACESHIELD WRAPAROUND (MASK) ×6 IMPLANT
GLENOID UNI VAULTLOCK LRG (Shoulder) ×2 IMPLANT
GLOVE BIO SURGEON STRL SZ7.5 (GLOVE) ×2 IMPLANT
GLOVE BIO SURGEON STRL SZ8 (GLOVE) ×2 IMPLANT
GLOVE EUDERMIC 7 POWDERFREE (GLOVE) ×2 IMPLANT
GLOVE SS BIOGEL STRL SZ 7.5 (GLOVE) ×1 IMPLANT
GLOVE SUPERSENSE BIOGEL SZ 7.5 (GLOVE) ×1
GOWN STRL REUS W/ TWL LRG LVL3 (GOWN DISPOSABLE) ×1 IMPLANT
GOWN STRL REUS W/ TWL XL LVL3 (GOWN DISPOSABLE) ×2 IMPLANT
GOWN STRL REUS W/TWL LRG LVL3 (GOWN DISPOSABLE) ×1
GOWN STRL REUS W/TWL XL LVL3 (GOWN DISPOSABLE) ×2
HEAD HUMERAL UNIVERS II 48/19 (Head) ×2 IMPLANT
KIT BASIN OR (CUSTOM PROCEDURE TRAY) ×2 IMPLANT
KIT ROOM TURNOVER OR (KITS) ×2 IMPLANT
KIT SET UNIVERSAL (KITS) ×2 IMPLANT
MANIFOLD NEPTUNE II (INSTRUMENTS) ×2 IMPLANT
NDL SUT .5 MAYO 1.404X.05X (NEEDLE) ×1 IMPLANT
NDL SUT 6 .5 CRC .975X.05 MAYO (NEEDLE) ×1 IMPLANT
NEEDLE MAYO TAPER (NEEDLE) ×2
NS IRRIG 1000ML POUR BTL (IV SOLUTION) ×2 IMPLANT
PACK SHOULDER (CUSTOM PROCEDURE TRAY) ×2 IMPLANT
PAD ARMBOARD 7.5X6 YLW CONV (MISCELLANEOUS) ×4 IMPLANT
RESTRAINT HEAD UNIVERSAL NS (MISCELLANEOUS) ×2 IMPLANT
SLING ARM FOAM STRAP LRG (SOFTGOODS) ×2 IMPLANT
SLING ARM XL FOAM STRAP (SOFTGOODS) ×2 IMPLANT
SMARTMIX MINI TOWER (MISCELLANEOUS) ×2
SPONGE LAP 18X18 X RAY DECT (DISPOSABLE) ×2 IMPLANT
SPONGE LAP 4X18 X RAY DECT (DISPOSABLE) ×2 IMPLANT
STEM HUMERAL APEX UNI 9MM (Stem) ×2 IMPLANT
SUCTION FRAZIER HANDLE 10FR (MISCELLANEOUS) ×1
SUCTION TUBE FRAZIER 10FR DISP (MISCELLANEOUS) ×1 IMPLANT
SUT FIBERWIRE #2 38 T-5 BLUE (SUTURE) ×8
SUT MNCRL AB 3-0 PS2 18 (SUTURE) ×2 IMPLANT
SUT MON AB 2-0 CT1 36 (SUTURE) ×2 IMPLANT
SUT VIC AB 1 CT1 27 (SUTURE) ×1
SUT VIC AB 1 CT1 27XBRD ANBCTR (SUTURE) ×1 IMPLANT
SUTURE FIBERWR #2 38 T-5 BLUE (SUTURE) ×4 IMPLANT
SYR CONTROL 10ML LL (SYRINGE) IMPLANT
TOWEL OR 17X24 6PK STRL BLUE (TOWEL DISPOSABLE) ×2 IMPLANT
TOWEL OR 17X26 10 PK STRL BLUE (TOWEL DISPOSABLE) ×2 IMPLANT
TOWER SMARTMIX MINI (MISCELLANEOUS) ×1 IMPLANT
WATER STERILE IRR 1000ML POUR (IV SOLUTION) ×2 IMPLANT

## 2016-01-14 NOTE — Op Note (Signed)
01/14/2016  12:15 PM  PATIENT:   Bailey Ayala  75 y.o. female  PRE-OPERATIVE DIAGNOSIS:  left shoulder osteoarthritis  POST-OPERATIVE DIAGNOSIS:  same  PROCEDURE:  L TSA #9 stem, 48x19 head, large glenoid  SURGEON:  Almir Botts, Metta Clines M.D.  ASSISTANTS: Shuford pac   ANESTHESIA:   GET + ISB  EBL: 150  SPECIMEN:  none  Drains: none   PATIENT DISPOSITION:  PACU - hemodynamically stable.    PLAN OF CARE: Admit for overnight observation  Dictation# ???   Contact # [phone]

## 2016-01-14 NOTE — Evaluation (Signed)
Occupational Therapy Evaluation Patient Details Name: Bailey Ayala MRN: [id number] DOB: [birth year] Today's Date: 01/14/2016    History of Present Illness 75 y.o. female s/p L TSA. PMH significant for HTN, HLD, PAD, COPD, dyspnea, TIA (2016) w/ resulting L hand weakness, thyroid disease, arthritis, L4-5 disc bulge, fiffuse cystic mastopathy, and blood dyscrasia.    Clinical Impression   PTA, pt was independent with ADLs and mobility. Pt currently requires mod assist for dressing and bathing tasks and min guard assist for basic transfers. Began education on NWBing LUE, shoulder protocol, sling wear protocol, and positioning at rest/sleep. Pt plans to d/c home with 24/7 assistance from her daughter for 6 weeks (her daughter has to return to [place] briefly in 2 weeks for a work commitment, but will return). Pt will benefit from continued acute OT to increase independence and safety with ADLs and mobility to allow for safe discharge home. Recommend HHOT.     Follow Up Recommendations  Home health OT;Supervision/Assistance - 24 hour    Equipment Recommendations  None recommended by OT    Recommendations for Other Services       Precautions / Restrictions Precautions Precautions: Shoulder;Fall Type of Shoulder Precautions: Passive protocol Shoulder Interventions: Shoulder sling/immobilizer;Off for dressing/bathing/exercises Precaution Booklet Issued: Yes (comment) Precaution Comments: PROM shoulder FF 90, ABD 60, ER 30; AROM elbow/wrist/hand allowed Required Braces or Orthoses: Sling Restrictions Weight Bearing Restrictions: Yes LUE Weight Bearing: Non weight bearing      Mobility Bed Mobility Overal bed mobility: Needs Assistance Bed Mobility: Sit to Supine       Sit to supine: Min assist   General bed mobility comments: Assist for trunk support for controlled descent onto bed. VCs for hand placement.  Transfers Overall transfer level: Needs assistance Equipment used:  None Transfers: Sit to/from Stand Sit to Stand: Min guard         General transfer comment: Min guard assist for safety and balance. VCs for safe hand placement and for shoulder precautions.    Balance Overall balance assessment: Needs assistance Sitting-balance support: No upper extremity supported;Feet supported Sitting balance-Leahy Scale: Good     Standing balance support: No upper extremity supported;During functional activity Standing balance-Leahy Scale: Fair                              ADL Overall ADL's : Needs assistance/impaired Eating/Feeding: Set up;Sitting   Grooming: Wash/dry hands;Min guard;Standing           Upper Body Dressing : Moderate assistance;Sitting Upper Body Dressing Details (indicate cue type and reason): to re-adjust brace Lower Body Dressing: Moderate assistance;Sit to/from stand   Toilet Transfer: Min guard;Ambulation;BSC   Toileting- Water quality scientist and Hygiene: Min guard;Sit to/from stand       Functional mobility during ADLs: Min guard General ADL Comments: Began education on NWBing LUE, shoulder precautions, sling wear protocol, and pillow placement at rest/sleep. Provided shoulder protocol and HEP handouts. No family present for OT eval.     Vision Vision Assessment?: No apparent visual deficits   Perception     Praxis      Pertinent Vitals/Pain Pain Assessment: No/denies pain     Hand Dominance Right   Extremity/Trunk Assessment Upper Extremity Assessment Upper Extremity Assessment: LUE deficits/detail LUE Deficits / Details: decreased ROM and strength as expected post op LUE Coordination: decreased gross motor   Lower Extremity Assessment Lower Extremity Assessment: Defer to PT evaluation   Cervical / Trunk  Assessment Cervical / Trunk Assessment: Normal   Communication Communication Communication: No difficulties   Cognition Arousal/Alertness: Awake/alert Behavior During Therapy: WFL for  tasks assessed/performed Overall Cognitive Status: Within Functional Limits for tasks assessed                     General Comments       Exercises Exercises: Shoulder     Shoulder Instructions Shoulder Instructions Donning/doffing shirt without moving shoulder: Moderate assistance Method for sponge bathing under operated UE: Moderate assistance Donning/doffing sling/immobilizer: Moderate assistance Correct positioning of sling/immobilizer: Minimal assistance Sling wearing schedule (on at all times/off for ADL's): Supervision/safety Proper positioning of operated UE when showering: Minimal assistance Positioning of UE while sleeping: Minimal assistance    Home Living Family/patient expects to be discharged to:: Private residence Living Arrangements: Other relatives (daughter from [place]) Available Help at Discharge: Family;Available 24 hours/day Type of Home: House Home Access: Stairs to enter [employer] of Steps: 10 Entrance Stairs-Rails: Left ("going in the house") Home Layout: One level     Bathroom Shower/Tub: Tub/shower unit Shower/tub characteristics: Curtain Biochemist, clinical: Standard     Home Equipment: Environmental consultant - 2 wheels;Cane - single point;Bedside commode;Tub bench;Grab bars - tub/shower;Hand held shower head;Adaptive equipment;Wheelchair - Diplomatic Services operational officer Equipment: Reacher;Sock aid;Long-handled shoe horn;Long-handled sponge Additional Comments: Pt's daughter has come from [place] to stay for 2 weeks and then has to return briefly to [facility] for a work commitment. Pt is trying to determine whether or not the daughter needs to return after her work Financial controller. Her daughter has planned to come back and stay an additional 4 weeks.      Prior Functioning/Environment Level of Independence: Independent                 OT Problem List: Decreased strength;Decreased range of motion;Impaired balance (sitting and/or standing);Decreased safety awareness;Decreased  knowledge of use of DME or AE;Decreased knowledge of precautions;Pain;Impaired UE functional use   OT Treatment/Interventions: Self-care/ADL training;Therapeutic exercise;DME and/or AE instruction;Therapeutic activities;Patient/family education;Balance training    OT Goals(Current goals can be found in the care plan section) Acute Rehab OT Goals Patient Stated Goal: to be independent in 2 weeks OT Goal Formulation: With patient Time For Goal Achievement: 01/28/16 Potential to Achieve Goals: Good ADL Goals Pt Will Perform Upper Body Bathing: with min assist;with caregiver independent in assisting;sitting Pt Will Perform Upper Body Dressing: with min assist;with caregiver independent in assisting;sitting Pt Will Transfer to Toilet: with supervision;ambulating;bedside commode (over toilet) Pt Will Perform Toileting - Clothing Manipulation and hygiene: with supervision;sit to/from stand Pt/caregiver will Perform Home Exercise Program: Increased ROM;Left upper extremity;With minimal assist;With written HEP provided  OT Frequency: Min 2X/week   Barriers to D/C:            Co-evaluation              End of Session Equipment Utilized During Treatment: Gait belt;Other (comment) (sling) Nurse Communication: Mobility status;Weight bearing status;Precautions  Activity Tolerance: Patient tolerated treatment well Patient left: in bed;with call bell/phone within reach;with SCD's reapplied   Time: 8144-8185 OT Time Calculation (min): 20 min Charges:  OT General Charges $OT Visit: 1 Procedure OT Evaluation $OT Eval Moderate Complexity: 1 Procedure G-Codes:    Redmond Baseman, OTR/L Pager: [phone] 01/14/2016, 5:36 PM

## 2016-01-14 NOTE — Transfer of Care (Signed)
Immediate Anesthesia Transfer of Care Note  Patient: Bailey Ayala  Procedure(s) Performed: Procedure(s): LEFT TOTAL SHOULDER ARTHROPLASTY (Left)  Patient Location: PACU  Anesthesia Type:GA combined with regional for post-op pain  Level of Consciousness: awake, alert  and oriented  Airway & Oxygen Therapy: Patient Spontanous Breathing and Patient connected to nasal cannula oxygen  Post-op Assessment: Report given to RN, Post -op Vital signs reviewed and stable and Patient moving all extremities  Post vital signs: Reviewed and stable  Last Vitals:  Vitals:   01/14/16 0845 01/14/16 1217  BP:  (!) 135/91  Pulse: 92 97  Resp: 18 20  Temp:  36.6 C    Last Pain:  Vitals:   01/14/16 0820  TempSrc: Oral      Patients Stated Pain Goal: 3 (33/17/40 9927)  Complications: No apparent anesthesia complications

## 2016-01-14 NOTE — H&P (Signed)
Bailey Ayala    Chief Complaint: left shoulder osteoarthritis HPI: The patient is a 75 y.o. female with end stage left shoulder OA  Past Medical History:  Diagnosis Date  . Arthritis   . Blood dyscrasia    BLEEDS EASILY   . COPD (chronic obstructive pulmonary disease) ([place])   . Diffuse cystic mastopathy   . Hyperlipidemia   . Hypertension   . L4-L5 disc bulge   . PAD (peripheral artery disease) ([place])   . Shortness of breath dyspnea    WITH EXERTION   . Stroke ([place])    TIA     2016     WAS FOUND ON SCAN ORDERED FROM NEUROL  . Thyroid disease   . TIA (transient ischemic attack) 2016   left hand weakness    Past Surgical History:  Procedure Laterality Date  . ABDOMINAL HYSTERECTOMY    . BACK SURGERY  2011  . COLONOSCOPY  1998  . EYE SURGERY Bilateral 05/06/14  . JOINT REPLACEMENT     LT KNEE  . KNEE SURGERY Left 2012  . parathyroid transplant     2/3 THYROID REMOVED   ([place])  . SPINE SURGERY    . TONSILLECTOMY    . ULNAR NERVE REPAIR     LEFT  . vein closure procedure Left 2010    Family History  Problem Relation Age of Onset  . Heart disease Mother     MI  . Arthritis Mother   . Stroke Mother   . Heart disease Father     MI    Social History:  reports that she quit smoking about 26 years ago. She has never used smokeless tobacco. She reports that she drinks alcohol. She reports that she does not use drugs.   Medications Prior to Admission  Medication Sig Dispense Refill  . acetaminophen (TYLENOL) 325 MG tablet Take 650 mg by mouth every 6 (six) hours as needed for mild pain.    Marland Kitchen atorvastatin (LIPITOR) 20 MG tablet Take 1 tablet (20 mg total) by mouth daily. 30 tablet 3  . benazepril (LOTENSIN) 20 MG tablet Take 1 tablet (20 mg total) by mouth daily. 30 tablet 5  . levothyroxine (SYNTHROID, LEVOTHROID) 112 MCG tablet Take 1 tablet (112 mcg total) by mouth daily. 30 tablet 5  . mometasone (ELOCON) 0.1 % cream Apply 1 application topically daily.  (Patient taking differently: Apply 1 application topically daily as needed. ) 45 g 0  . Multiple Vitamins-Minerals (CENTRUM SILVER ULTRA WOMENS PO) Take 1 tablet by mouth daily.        Physical Exam: left shoulder with painful and restricted motion as noted a recent office visits  Vitals  Temp:  [97.9 F (36.6 C)] 97.9 F (36.6 C) (09/21 0820) Pulse Rate:  [92-94] 92 (09/21 0845) Resp:  [18] 18 (09/21 0845) BP: (167)/(44) 167/44 (09/21 0820) SpO2:  [97 %] 97 % (09/21 0845) Weight:  [86.6 kg (191 lb)] 86.6 kg (191 lb) (09/21 0820)  Assessment/Plan  Impression: left shoulder osteoarthritis  Plan of Action: Procedure(s): LEFT TOTAL SHOULDER ARTHROPLASTY  Kijuan Gallicchio M Terrina Docter 01/14/2016, 9:53 AM Contact # [phone]

## 2016-01-14 NOTE — Anesthesia Postprocedure Evaluation (Signed)
Anesthesia Post Note  Patient: Bailey Ayala  Procedure(s) Performed: Procedure(s) (LRB): LEFT TOTAL SHOULDER ARTHROPLASTY (Left)  Patient location during evaluation: PACU Anesthesia Type: General and Regional Level of consciousness: awake and alert Pain management: pain level controlled Vital Signs Assessment: post-procedure vital signs reviewed and stable Respiratory status: spontaneous breathing, nonlabored ventilation and respiratory function stable Cardiovascular status: blood pressure returned to baseline and stable Postop Assessment: no signs of nausea or vomiting Anesthetic complications: no    Last Vitals:  Vitals:   01/14/16 1314 01/14/16 1315  BP:    Pulse: 89   Resp: 15 (!) 8  Temp:      Last Pain:  Vitals:   01/14/16 1315  TempSrc:   PainSc: 5                  Nilda Simmer

## 2016-01-14 NOTE — Anesthesia Procedure Notes (Signed)
Procedure Name: Intubation Date/Time: 01/14/2016 10:30 AM Performed by: Kyung Rudd Pre-anesthesia Checklist: Patient identified, Emergency Drugs available, Suction available and Patient being monitored Patient Re-evaluated:Patient Re-evaluated prior to inductionOxygen Delivery Method: Circle system utilized Preoxygenation: Pre-oxygenation with 100% oxygen Intubation Type: IV induction Ventilation: Mask ventilation without difficulty Laryngoscope Size: Mac and 3 Grade View: Grade I Tube type: Oral Tube size: 7.0 mm Number of attempts: 1 Airway Equipment and Method: Stylet Placement Confirmation: ETT inserted through vocal cords under direct vision,  positive ETCO2 and breath sounds checked- equal and bilateral Secured at: 21 cm Tube secured with: Tape Dental Injury: Teeth and Oropharynx as per pre-operative assessment

## 2016-01-14 NOTE — Discharge Instructions (Signed)

## 2016-01-15 ENCOUNTER — Encounter (HOSPITAL_COMMUNITY): Payer: Self-pay | Admitting: Orthopedic Surgery

## 2016-01-15 MED ORDER — ONDANSETRON HCL 4 MG PO TABS: 4.0000 mg | ORAL_TABLET | Freq: Three times a day (TID) | ORAL | 0 refills | Status: DC | PRN

## 2016-01-15 MED ORDER — OXYCODONE-ACETAMINOPHEN 5-325 MG PO TABS: 1.0000 | ORAL_TABLET | ORAL | 0 refills | Status: DC | PRN

## 2016-01-15 MED ORDER — DIAZEPAM 5 MG PO TABS: 2.5000 mg | ORAL_TABLET | Freq: Four times a day (QID) | ORAL | 1 refills | Status: DC | PRN

## 2016-01-15 NOTE — Op Note (Signed)
NAMEMarland Ayala  [name], [name] NO.:  [id number]  MEDICAL RECORD NO.:  [id number]  LOCATION:  3C08C                        FACILITY:  [facility]  PHYSICIAN:  Metta Clines. Windi Toro, M.D.  DATE OF BIRTH:  23-Aug-1940  DATE OF PROCEDURE:  01/14/2016 DATE OF DISCHARGE:                              OPERATIVE REPORT   PREOPERATIVE DIAGNOSIS:  End-stage left shoulder osteoarthritis.  POSTOPERATIVE DIAGNOSIS:  End-stage left shoulder osteoarthritis.  PROCEDURE:  A left total shoulder arthroplasty utilizing a press-fit size 9 Arthrex stem with 48 x 19 head and a cemented pegged large glenoid.  SURGEON:  Metta Clines. Dalores Weger, M.D.  ASSISTANT:  Jenetta Loges, PA-C.  ANESTHESIA:  General endotracheal as well as an interscalene block.  BLOOD LOSS:  150 mL.  DRAINS:  None.  HISTORY:  Ms. Bailey Ayala is a 75 year old female, who has had chronic and progressive increasing left shoulder pain related to end-stage osteoarthritis.  Her symptoms have been refractory to prolonged attempts at conservative management.  Due to the increasing pain and functional limitations, she is brought to the operating room at this time for planned left total shoulder arthroplasty.  Preoperatively, I counseled Ms. Bailey Ayala regarding treatment options, potential risks versus benefits thereof.  Possible surgical complications were reviewed including bleeding, infection, neurovascular injury, persistent pain, loss of motion, anesthetic complication, failure of the implant, possible need for additional surgery.  She understands and accepts and agrees with our planned procedure.  PROCEDURE IN DETAIL:  After undergoing routine preop evaluation, the patient received prophylactic antibiotics and an interscalene block was established in the holding area by the Anesthesia Department.  Placed supine on the operating table, underwent smooth induction of a general endotracheal anesthesia.  Placed in the beach-chair position  and appropriately padded and protected.  The left shoulder girdle region was sterilely prepped and draped in standard fashion.  Time-out was called. An anterior deltopectoral approach to the left shoulder was made through an 8-cm incision.  Skin flaps were elevated.  Electrocautery was used for hemostasis.  Dissection was then carried deeply.  Deltopectoral interval was identified and developed bluntly from proximal to distal with the vein taken laterally.  The upper centimeter of pec major was tenotomized to improve exposure.  Dissection beneath the deltoid, placed Browne retractor proximally, mobilized the conjoined tendon, retracted this medially, and placed a self-retaining retractor distally.  We then unroofed the biceps tendon which showed significant tenosynovitis, marked swelling and inflammation about it.  We unroofed the tendon, removed a large amount of synovial fluid from around the tendon and tenotomized the biceps for later tenodesis.  At this point, we then divided the rotator cuff along the rotator interval towards the base of the coracoid and then separated the subscap away from its lesser tuberosity insertion leaving a 1 cm cuff for later repair and tagged the free margin with a series of figure-of-eight #2 FiberWire sutures.  We then completely divided the capsule tissues from the anterior and anterior-inferior and inferior aspects of the humeral neck.  This allowed delivery of the humeral head through the wound.  At this point, we then outlined our proposed humeral head resection using the extramedullary guide and performed this resection with an oscillating  saw carefully protecting the rotator cuff superiorly and posteriorly.  A rongeur was then used to remove the large osteophytes from the anterior inferior aspect of the humeral neck.  At this point, we then hand reamed the canal to size 7, broached up to size 9 with excellent fit.  A size 8 trial with metal cap was  then placed in the humeral canal.  At this point, we then exposed the glenoid using a combination of Fukuda, snake tongue, and pitchfork retractors.  I carefully mobilized the subscapularis and this was then retracted anteriorly.  We then performed a circumferential labral excision and removed the proximal stump of the biceps tendon and gained circumferential exposure of the entirety of the glenoid.  Once this was completed, I placed a central guidepin.  We reamed the glenoid to a smooth subchondral bony bed with a large reamer and placed a central drill hole followed by the superior and inferior PEG holes using the appropriate guide and the glenoid trial showed excellent fit and fixation.  At this point, we copiously irrigated the glenoid, cleaned and dried.  Cement was mixed and introduced into superior and inferior drill holes and slots respectively and we impacted our glenoid with final fit and fixation much to our satisfaction.  We then returned our attention back to the proximal humerus where the canal was cleaned and dried.  We impacted our size 9 stem with excellent fit and fixation.  The proximal aspect of the implant was then tightened appropriately.  We then performed a series of trial reductions, ultimately found the 48 x 19 head gave [place] excellent soft tissue balance with 50% translation of the humeral head and glenoid.  The [facility] taper was then cleaned and dried.  The final 48 x 19 head was then impacted. Final reduction was performed.  Again overall soft tissue balance was excellent much to our satisfaction with 50% percent translation of the humeral head and glenoid.  We then copiously irrigated the joint.  The subscapularis was then mobilized and freed up such that we had excellent elasticity and then repaired this back to the lesser tuberosity through the cuff of soft tissue using #2 FiberWires and also repaired the rotator interval with pair of figure-of-eight #2  FiberWire sutures.  The biceps tendon was then tenodesed at the upper border of the pec major with #2 FiberWire and the residual proximal stump was excised.  At this point, final irrigation was completed.  Hemostasis was obtained.  The deltopectoral interval was reapproximated with series of figure-of-eight #1 Vicryl sutures.  The 2-0 Vicryl was used for subcu layer, intracuticular 3-0 Monocryl for the skin, followed by Dermabond and Aquacel dressing.  Left arm was placed into a sling.  The patient was awakened, extubated, and taken to the recovery room in stable condition.  Jenetta Loges, PAC was used as an Environmental consultant throughout this case, was essential for help with positioning the patient, positioning the extremity, management of the retractors, tissue manipulation, implantation of prosthesis, wound closure, and intraoperative decision making.     Metta Clines. Antonela Freiman, M.D.     KMS/MEDQ  D:  01/14/2016  T:  01/15/2016  Job:  [id number]

## 2016-01-15 NOTE — Evaluation (Signed)
Physical Therapy Evaluation Patient Details Name: Bailey Ayala MRN: [id number] DOB: Jan 10, 1941 Today's Date: 01/15/2016   History of Present Illness  75 y.o. female s/p L TSA. PMH significant for HTN, HLD, PAD, COPD, dyspnea, TIA (2016) w/ resulting L hand weakness, thyroid disease, arthritis, L4-5 disc bulge, fiffuse cystic mastopathy, and blood dyscrasia.   Clinical Impression  Patient seen for mobility assessment and education s/p TSA. Patient mobilizing well, performed stair negotiation and educated on car transfers and safety with mobility. Will defer all further needs to OT therapy.     Follow Up Recommendations No PT follow up    Equipment Recommendations  None recommended by PT    Recommendations for Other Services       Precautions / Restrictions Precautions Precautions: Shoulder;Fall Type of Shoulder Precautions: Passive protocol Shoulder Interventions: Shoulder sling/immobilizer;Off for dressing/bathing/exercises Precaution Booklet Issued: Yes (comment) Precaution Comments: PROM shoulder FF 90, ABD 60, ER 30; AROM elbow/wrist/hand allowed Required Braces or Orthoses: Sling Restrictions Weight Bearing Restrictions: Yes LUE Weight Bearing: Non weight bearing      Mobility  Bed Mobility               General bed mobility comments: received on commode  Transfers Overall transfer level: Needs assistance Equipment used: None Transfers: Sit to/from Stand Sit to Stand: Min guard         General transfer comment: Min guard assist for safety and balance. VCs for safe hand placement and for shoulder precautions.  Ambulation/Gait Ambulation/Gait assistance: Supervision Ambulation Distance (Feet): 160 Feet Assistive device: None Gait Pattern/deviations: WFL(Within Functional Limits) Gait velocity: decreased Gait velocity interpretation: Below normal speed for age/gender General Gait Details: slow guarded gait, VCs for increased  cadence  Stairs Stairs: Yes Stairs assistance: Min guard Stair Management: One rail Right;Sideways Number of Stairs: 6 General stair comments: VCs for sideways descent on stairs  Wheelchair Mobility    Modified Rankin (Stroke Patients Only)       Balance   Sitting-balance support: No upper extremity supported Sitting balance-Leahy Scale: Good     Standing balance support: No upper extremity supported Standing balance-Leahy Scale: Fair                               Pertinent Vitals/Pain Pain Assessment: 0-10 Pain Score: 5  Pain Location: shoulder Pain Descriptors / Indicators: Sore Pain Intervention(s): Monitored during session    Home Living Family/patient expects to be discharged to:: Private residence Living Arrangements: Other relatives (daughter from [place]) Available Help at Discharge: Family;Available 24 hours/day Type of Home: House Home Access: Stairs to enter Entrance Stairs-Rails: Left ("going in the house") Entrance Stairs-Number of Steps: 10 Home Layout: One level Home Equipment: Walker - 2 wheels;Cane - single point;Bedside commode;Tub bench;Grab bars - tub/shower;Hand held shower head;Adaptive equipment;Wheelchair - manual Additional Comments: Pt's daughter has come from [place] to stay for 2 weeks and then has to return briefly to [facility] for a work commitment. Pt is trying to determine whether or not the daughter needs to return after her work Financial controller. Her daughter has planned to come back and stay an additional 4 weeks.    Prior Function Level of Independence: Independent               Hand Dominance   Dominant Hand: Right    Extremity/Trunk Assessment   Upper Extremity Assessment: Defer to OT evaluation           Lower  Extremity Assessment: Overall WFL for tasks assessed      Cervical / Trunk Assessment: Normal  Communication   Communication: No difficulties  Cognition Arousal/Alertness: Awake/alert Behavior During  Therapy: WFL for tasks assessed/performed Overall Cognitive Status: Within Functional Limits for tasks assessed                      General Comments      Exercises     Assessment/Plan    PT Assessment Patent does not need any further PT services  PT Problem List            PT Treatment Interventions      PT Goals (Current goals can be found in the Care Plan section)  Acute Rehab PT Goals Patient Stated Goal: to be independent in 2 weeks PT Goal Formulation: All assessment and education complete, DC therapy (defer all further needs to OT therapy)    Frequency     Barriers to discharge        Co-evaluation               End of Session Equipment Utilized During Treatment: Other (comment) (sling) Activity Tolerance: Patient tolerated treatment well Patient left: in chair;with call bell/phone within reach;with family/visitor present Nurse Communication: Mobility status    Functional Assessment Tool Used: clinical judgement Functional Limitation: Mobility: Walking and moving around Mobility: Walking and Moving Around Current Status [id number]): At least 1 percent but less than 20 percent impaired, limited or restricted Mobility: Walking and Moving Around Goal Status [phone]): At least 1 percent but less than 20 percent impaired, limited or restricted Mobility: Walking and Moving Around Discharge Status [phone]): At least 1 percent but less than 20 percent impaired, limited or restricted    Time: 0845-0858 PT Time Calculation (min) (ACUTE ONLY): 13 min   Charges:   PT Evaluation $PT Eval Low Complexity: 1 Procedure     PT G Codes:   PT G-Codes **NOT FOR INPATIENT CLASS** Functional Assessment Tool Used: clinical judgement Functional Limitation: Mobility: Walking and moving around Mobility: Walking and Moving Around Current Status (I5027): At least 1 percent but less than 20 percent impaired, limited or restricted Mobility: Walking and Moving Around Goal  Status [phone]): At least 1 percent but less than 20 percent impaired, limited or restricted Mobility: Walking and Moving Around Discharge Status [phone]): At least 1 percent but less than 20 percent impaired, limited or restricted    Duncan Dull 01/15/2016, 9:03 AM Alben Deeds, PT DPT  [phone]

## 2016-01-15 NOTE — Progress Notes (Signed)
Pt and daughter given D/C instructions with Rx's, verbal understanding was provided. Pt's incision is covered with post-op dressing and is clean and dry. Pt's IV was removed prior to D/C. Pt D/C'd home via wheelchair @ 1200 per MD order. Pt is stable @ D/C and has no other needs at this time. Holli Humbles, RN

## 2016-01-15 NOTE — Progress Notes (Signed)
01/15/16 0915  OT Visit Information  Last OT Received On 01/15/16  Assistance Needed +1  History of Present Illness 75 y.o. female s/p L TSA. PMH significant for HTN, HLD, PAD, COPD, dyspnea, TIA (2016) w/ resulting L hand weakness, thyroid disease, arthritis, L4-5 disc bulge, fiffuse cystic mastopathy, and blood dyscrasia.   Precautions  Precautions Shoulder;Fall  Type of Shoulder Precautions Passive protocol  Shoulder Interventions Shoulder sling/immobilizer;Off for dressing/bathing/exercises  Precaution Booklet Issued Yes (comment)  Precaution Comments PROM shoulder FF 90, ABD 60, ER 30; AROM elbow/wrist/hand allowed  Required Braces or Orthoses Sling  Pain Assessment  Pain Assessment Faces  Faces Pain Scale 6  Pain Location shoulder  Pain Descriptors / Indicators Operative site guarding  Pain Intervention(s) Repositioned;Premedicated before session;Monitored during session;Limited activity within patient's tolerance  Cognition  Arousal/Alertness Awake/alert  Behavior During Therapy WFL for tasks assessed/performed  Overall Cognitive Status Within Functional Limits for tasks assessed  ADL  Overall ADL's  Needs assistance/impaired  Eating/Feeding Set up;Sitting  Grooming Wash/dry face;Min guard;Sitting  Upper Body Bathing Minimal assitance;Sitting  Lower Body Bathing Minimal assistance;Sit to/from Environmental education officer guard  Functional mobility during ADLs Min guard  General ADL Comments Pt educated with daughter present for exercises in supine  Bed Mobility  Overal bed mobility Needs Assistance  Bed Mobility Supine to Sit;Sit to Supine  Supine to sit Min guard  Sit to supine Min guard  Balance  Overall balance assessment Needs assistance  Sitting-balance support Single extremity supported;Feet supported  Sitting balance-Leahy Scale Good  Restrictions  Weight Bearing Restrictions No  LUE Weight Bearing NWB  Transfers  Overall transfer level Needs assistance   Transfers Sit to/from Stand  Sit to Stand Min guard  Exercises  Exercises Shoulder  Shoulder Instructions  Donning/doffing shirt without moving shoulder Minimal assistance  Method for sponge bathing under operated UE Minimal assistance  Donning/doffing sling/immobilizer Minimal assistance  Correct positioning of sling/immobilizer Minimal assistance  ROM for elbow, wrist and digits of operated UE Min-guard  Sling wearing schedule (on at all times/off for ADL's) Min-guard  Positioning of UE while sleeping Minimal assistance  OT - End of Session  Activity Tolerance Patient tolerated treatment well  Patient left in bed;with call bell/phone within reach;with family/visitor present  Nurse Communication Mobility status;Precautions  OT Assessment/Plan  OT Plan Discharge plan remains appropriate  OT Frequency (ACUTE ONLY) Min 2X/week  Follow Up Recommendations Home health OT;Supervision/Assistance - 24 hour  OT Equipment None recommended by OT  OT Goal Progression  Progress towards OT goals Progressing toward goals  Acute Rehab OT Goals  Patient Stated Goal to be independent in 2 weeks  OT Goal Formulation With patient  Time For Goal Achievement 01/28/16  Potential to Achieve Goals Good  ADL Goals  Pt Will Perform Upper Body Bathing with min assist;with caregiver independent in assisting;sitting  Pt Will Perform Upper Body Dressing with min assist;with caregiver independent in assisting;sitting  Pt Will Transfer to Toilet with supervision;ambulating;bedside commode  Pt Will Perform Toileting - Clothing Manipulation and hygiene with supervision;sit to/from stand  Pt/caregiver will Perform Home Exercise Program Increased ROM;Left upper extremity;With minimal assist;With written HEP provided  OT Time Calculation  OT Start Time (ACUTE ONLY) 3785  OT Stop Time (ACUTE ONLY) 8850  OT Time Calculation (min) 30 min  OT General Charges  $OT Visit 1 Procedure  OT Treatments  $Self Care/Home  Management  8-22 mins  $Therapeutic Activity 8-22 mins    Jeri Modena   OTR/L  Pager: [phone] Office: [phone] .

## 2016-01-15 NOTE — Discharge Summary (Signed)
PATIENT ID:      Bailey Ayala  MRN:     [id number] DOB/AGE:    74/25/1942 / 75 y.o.     DISCHARGE SUMMARY  ADMISSION DATE:    01/14/2016 DISCHARGE DATE:    ADMISSION DIAGNOSIS: left shoulder osteoarthritis Past Medical History:  Diagnosis Date  . Arthritis   . Blood dyscrasia    BLEEDS EASILY   . COPD (chronic obstructive pulmonary disease) ([place])   . Diffuse cystic mastopathy   . Hyperlipidemia   . Hypertension   . L4-L5 disc bulge   . PAD (peripheral artery disease) ([place])   . Shortness of breath dyspnea    WITH EXERTION   . Stroke ([place])    TIA     2016     WAS FOUND ON SCAN ORDERED FROM NEUROL  . Thyroid disease   . TIA (transient ischemic attack) 2016   left hand weakness    DISCHARGE DIAGNOSIS:   Active Problems:   S/P shoulder replacement   PROCEDURE: Procedure(s): LEFT TOTAL SHOULDER ARTHROPLASTY on 01/14/2016  CONSULTS:    HISTORY:  See H&P in chart.  HOSPITAL COURSE:  Bailey Ayala is a 75 y.o. admitted on 01/14/2016 with a diagnosis of left shoulder osteoarthritis.  They were brought to the operating room on 01/14/2016 and underwent Procedure(s): LEFT TOTAL SHOULDER ARTHROPLASTY.    They were given perioperative antibiotics: Anti-infectives    Start     Dose/Rate Route Frequency Ordered Stop   01/14/16 1640  ceFAZolin (ANCEF) IVPB 2g/100 mL premix     2 g 200 mL/hr over 30 Minutes Intravenous Every 6 hours 01/14/16 1547 01/15/16 0611   01/14/16 0930  ceFAZolin (ANCEF) IVPB 2g/100 mL premix     2 g 200 mL/hr over 30 Minutes Intravenous To ShortStay Surgical 01/13/16 0825 01/14/16 1055    .  Patient underwent the above named procedure and tolerated it well. The following day they were hemodynamically stable and pain was controlled on oral analgesics. They were neurovascularly intact to the operative extremity. OT was ordered and worked with patient per protocol. They were medically and orthopaedically stable for discharge on .Home health services were  arranged    DIAGNOSTIC STUDIES:  RECENT RADIOGRAPHIC STUDIES :  No results found.  RECENT VITAL SIGNS:  Patient Vitals for the past 24 hrs:  BP Temp Temp src Pulse Resp SpO2 Weight  01/15/16 0750 118/60 98.3 F (36.8 C) Oral 94 18 94 % -  01/15/16 0403 (!) 108/57 98.3 F (36.8 C) Oral 96 18 94 % -  01/14/16 2321 (!) 116/51 98.3 F (36.8 C) Oral 95 18 97 % -  01/14/16 2000 (!) 99/53 98.3 F (36.8 C) Oral 91 18 97 % -  01/14/16 1658 (!) 117/55 97.7 F (36.5 C) - 93 20 98 % -  01/14/16 1548 - - - - - 97 % -  01/14/16 1456 121/62 97.7 F (36.5 C) - 93 18 97 % -  01/14/16 1421 - 97.6 F (36.4 C) - 85 16 100 % -  01/14/16 1315 - - - - (!) 8 - -  01/14/16 1314 - - - 89 15 97 % -  01/14/16 1251 - - - 89 12 100 % -  01/14/16 1230 - - - 91 10 96 % -  01/14/16 1217 (!) 135/91 97.9 F (36.6 C) - 97 20 97 % -  01/14/16 0845 - - - 92 18 97 % -  01/14/16 0820 (!) 167/44 97.9 F (36.6  C) Oral 94 18 97 % 86.6 kg (191 lb)  .  RECENT EKG RESULTS:    Orders placed or performed in visit on 09/29/15  . EKG 12-Lead    DISCHARGE INSTRUCTIONS:    DISCHARGE MEDICATIONS:     Medication List    STOP taking these medications   acetaminophen 325 MG tablet Commonly known as:  TYLENOL     TAKE these medications   atorvastatin 20 MG tablet Commonly known as:  LIPITOR Take 1 tablet (20 mg total) by mouth daily.   benazepril 20 MG tablet Commonly known as:  LOTENSIN Take 1 tablet (20 mg total) by mouth daily.   CENTRUM SILVER ULTRA WOMENS PO Take 1 tablet by mouth daily.   diazepam 5 MG tablet Commonly known as:  VALIUM Take 0.5-1 tablets (2.5-5 mg total) by mouth every 6 (six) hours as needed for muscle spasms or sedation.   levothyroxine 112 MCG tablet Commonly known as:  SYNTHROID, LEVOTHROID Take 1 tablet (112 mcg total) by mouth daily.   mometasone 0.1 % cream Commonly known as:  ELOCON Apply 1 application topically daily. What changed:  when to take this  reasons  to take this   ondansetron 4 MG tablet Commonly known as:  ZOFRAN Take 1 tablet (4 mg total) by mouth every 8 (eight) hours as needed for nausea or vomiting.   oxyCODONE-acetaminophen 5-325 MG tablet Commonly known as:  PERCOCET Take 1-2 tablets by mouth every 4 (four) hours as needed.       FOLLOW UP VISIT:   Follow-up Information    Metta Clines SUPPLE, MD.   Specialty:  Orthopedic Surgery Why:  call to be seen in 10-14 days Contact information: [address] [place] [address] [phone]           DISCHARGE TO: home  DISPOSITION: Good  DISCHARGE CONDITION:  Bailey Ayala for Dr. Justice Britain 01/15/2016, 8:15 AM

## 2016-01-15 NOTE — Care Management Note (Signed)
Case Management Note  Patient Details  Name: Bailey Ayala MRN: [id number] Date of Birth: 1941/02/07  Subjective/Objective:  75 yr old female s/p left total shoulder arthroplasty.                  Action/Plan:  Case manager spoke with patient concerning Home Health needs at discharge. Choice was offered for [place]. Referral was called to [place], [facility] Liaison. Patient states she will have family assistance at discharge.    Expected Discharge Date:    01/15/16              Expected Discharge Plan:   Home with Home Health  In-House Referral:     Discharge planning Services  CM Consult  Post Acute Care Choice:  Home Health Choice offered to:  Patient  DME Arranged:  N/A DME Agency:  NA  HH Arranged:  OT, PT [place] Agency:  [place]  Status of Service:  Completed, signed off  If discussed at [place] of Stay Meetings, dates discussed:    Additional Comments:  Ninfa Meeker, RN 01/15/2016, 10:42 AM

## 2016-01-16 DIAGNOSIS — I739 Peripheral vascular disease, unspecified: Secondary | ICD-10-CM | POA: Diagnosis not present

## 2016-01-16 DIAGNOSIS — J449 Chronic obstructive pulmonary disease, unspecified: Secondary | ICD-10-CM | POA: Diagnosis not present

## 2016-01-16 DIAGNOSIS — Z96612 Presence of left artificial shoulder joint: Secondary | ICD-10-CM | POA: Diagnosis not present

## 2016-01-16 DIAGNOSIS — Z8673 Personal history of transient ischemic attack (TIA), and cerebral infarction without residual deficits: Secondary | ICD-10-CM | POA: Diagnosis not present

## 2016-01-16 DIAGNOSIS — Z4801 Encounter for change or removal of surgical wound dressing: Secondary | ICD-10-CM | POA: Diagnosis not present

## 2016-01-16 DIAGNOSIS — Z471 Aftercare following joint replacement surgery: Secondary | ICD-10-CM | POA: Diagnosis not present

## 2016-01-16 DIAGNOSIS — I10 Essential (primary) hypertension: Secondary | ICD-10-CM | POA: Diagnosis not present

## 2016-01-16 DIAGNOSIS — Z96659 Presence of unspecified artificial knee joint: Secondary | ICD-10-CM | POA: Diagnosis not present

## 2016-01-16 DIAGNOSIS — E785 Hyperlipidemia, unspecified: Secondary | ICD-10-CM | POA: Diagnosis not present

## 2016-01-19 DIAGNOSIS — Z471 Aftercare following joint replacement surgery: Secondary | ICD-10-CM | POA: Diagnosis not present

## 2016-01-19 DIAGNOSIS — I10 Essential (primary) hypertension: Secondary | ICD-10-CM | POA: Diagnosis not present

## 2016-01-19 DIAGNOSIS — E785 Hyperlipidemia, unspecified: Secondary | ICD-10-CM | POA: Diagnosis not present

## 2016-01-19 DIAGNOSIS — I739 Peripheral vascular disease, unspecified: Secondary | ICD-10-CM | POA: Diagnosis not present

## 2016-01-19 DIAGNOSIS — J449 Chronic obstructive pulmonary disease, unspecified: Secondary | ICD-10-CM | POA: Diagnosis not present

## 2016-01-19 DIAGNOSIS — Z96612 Presence of left artificial shoulder joint: Secondary | ICD-10-CM | POA: Diagnosis not present

## 2016-01-21 DIAGNOSIS — E785 Hyperlipidemia, unspecified: Secondary | ICD-10-CM | POA: Diagnosis not present

## 2016-01-21 DIAGNOSIS — I739 Peripheral vascular disease, unspecified: Secondary | ICD-10-CM | POA: Diagnosis not present

## 2016-01-21 DIAGNOSIS — Z471 Aftercare following joint replacement surgery: Secondary | ICD-10-CM | POA: Diagnosis not present

## 2016-01-21 DIAGNOSIS — I10 Essential (primary) hypertension: Secondary | ICD-10-CM | POA: Diagnosis not present

## 2016-01-21 DIAGNOSIS — J449 Chronic obstructive pulmonary disease, unspecified: Secondary | ICD-10-CM | POA: Diagnosis not present

## 2016-01-21 DIAGNOSIS — Z96612 Presence of left artificial shoulder joint: Secondary | ICD-10-CM | POA: Diagnosis not present

## 2016-01-22 DIAGNOSIS — Z96612 Presence of left artificial shoulder joint: Secondary | ICD-10-CM | POA: Diagnosis not present

## 2016-01-22 DIAGNOSIS — I10 Essential (primary) hypertension: Secondary | ICD-10-CM | POA: Diagnosis not present

## 2016-01-22 DIAGNOSIS — Z471 Aftercare following joint replacement surgery: Secondary | ICD-10-CM | POA: Diagnosis not present

## 2016-01-22 DIAGNOSIS — E785 Hyperlipidemia, unspecified: Secondary | ICD-10-CM | POA: Diagnosis not present

## 2016-01-22 DIAGNOSIS — J449 Chronic obstructive pulmonary disease, unspecified: Secondary | ICD-10-CM | POA: Diagnosis not present

## 2016-01-22 DIAGNOSIS — I739 Peripheral vascular disease, unspecified: Secondary | ICD-10-CM | POA: Diagnosis not present

## 2016-01-25 DIAGNOSIS — Z96612 Presence of left artificial shoulder joint: Secondary | ICD-10-CM | POA: Diagnosis not present

## 2016-01-25 DIAGNOSIS — E785 Hyperlipidemia, unspecified: Secondary | ICD-10-CM | POA: Diagnosis not present

## 2016-01-25 DIAGNOSIS — Z471 Aftercare following joint replacement surgery: Secondary | ICD-10-CM | POA: Diagnosis not present

## 2016-01-25 DIAGNOSIS — I10 Essential (primary) hypertension: Secondary | ICD-10-CM | POA: Diagnosis not present

## 2016-01-25 DIAGNOSIS — J449 Chronic obstructive pulmonary disease, unspecified: Secondary | ICD-10-CM | POA: Diagnosis not present

## 2016-01-25 DIAGNOSIS — I739 Peripheral vascular disease, unspecified: Secondary | ICD-10-CM | POA: Diagnosis not present

## 2016-01-27 DIAGNOSIS — Z96612 Presence of left artificial shoulder joint: Secondary | ICD-10-CM | POA: Diagnosis not present

## 2016-01-27 DIAGNOSIS — Z471 Aftercare following joint replacement surgery: Secondary | ICD-10-CM | POA: Diagnosis not present

## 2016-01-28 DIAGNOSIS — Z96612 Presence of left artificial shoulder joint: Secondary | ICD-10-CM | POA: Diagnosis not present

## 2016-01-28 DIAGNOSIS — I10 Essential (primary) hypertension: Secondary | ICD-10-CM | POA: Diagnosis not present

## 2016-01-28 DIAGNOSIS — Z471 Aftercare following joint replacement surgery: Secondary | ICD-10-CM | POA: Diagnosis not present

## 2016-01-28 DIAGNOSIS — J449 Chronic obstructive pulmonary disease, unspecified: Secondary | ICD-10-CM | POA: Diagnosis not present

## 2016-01-28 DIAGNOSIS — I739 Peripheral vascular disease, unspecified: Secondary | ICD-10-CM | POA: Diagnosis not present

## 2016-01-28 DIAGNOSIS — E785 Hyperlipidemia, unspecified: Secondary | ICD-10-CM | POA: Diagnosis not present

## 2016-02-01 DIAGNOSIS — Z471 Aftercare following joint replacement surgery: Secondary | ICD-10-CM | POA: Diagnosis not present

## 2016-02-01 DIAGNOSIS — Z96612 Presence of left artificial shoulder joint: Secondary | ICD-10-CM | POA: Diagnosis not present

## 2016-02-01 DIAGNOSIS — I739 Peripheral vascular disease, unspecified: Secondary | ICD-10-CM | POA: Diagnosis not present

## 2016-02-01 DIAGNOSIS — I10 Essential (primary) hypertension: Secondary | ICD-10-CM | POA: Diagnosis not present

## 2016-02-01 DIAGNOSIS — E785 Hyperlipidemia, unspecified: Secondary | ICD-10-CM | POA: Diagnosis not present

## 2016-02-01 DIAGNOSIS — J449 Chronic obstructive pulmonary disease, unspecified: Secondary | ICD-10-CM | POA: Diagnosis not present

## 2016-02-04 DIAGNOSIS — I10 Essential (primary) hypertension: Secondary | ICD-10-CM | POA: Diagnosis not present

## 2016-02-04 DIAGNOSIS — J449 Chronic obstructive pulmonary disease, unspecified: Secondary | ICD-10-CM | POA: Diagnosis not present

## 2016-02-04 DIAGNOSIS — Z96612 Presence of left artificial shoulder joint: Secondary | ICD-10-CM | POA: Diagnosis not present

## 2016-02-04 DIAGNOSIS — Z471 Aftercare following joint replacement surgery: Secondary | ICD-10-CM | POA: Diagnosis not present

## 2016-02-04 DIAGNOSIS — E785 Hyperlipidemia, unspecified: Secondary | ICD-10-CM | POA: Diagnosis not present

## 2016-02-04 DIAGNOSIS — I739 Peripheral vascular disease, unspecified: Secondary | ICD-10-CM | POA: Diagnosis not present

## 2016-02-08 DIAGNOSIS — Z96612 Presence of left artificial shoulder joint: Secondary | ICD-10-CM | POA: Diagnosis not present

## 2016-02-08 DIAGNOSIS — I10 Essential (primary) hypertension: Secondary | ICD-10-CM | POA: Diagnosis not present

## 2016-02-08 DIAGNOSIS — E785 Hyperlipidemia, unspecified: Secondary | ICD-10-CM | POA: Diagnosis not present

## 2016-02-08 DIAGNOSIS — J449 Chronic obstructive pulmonary disease, unspecified: Secondary | ICD-10-CM | POA: Diagnosis not present

## 2016-02-08 DIAGNOSIS — Z471 Aftercare following joint replacement surgery: Secondary | ICD-10-CM | POA: Diagnosis not present

## 2016-02-08 DIAGNOSIS — I739 Peripheral vascular disease, unspecified: Secondary | ICD-10-CM | POA: Diagnosis not present

## 2016-02-09 ENCOUNTER — Encounter: Payer: Medicare Other | Admitting: Family Medicine

## 2016-02-10 DIAGNOSIS — Z96612 Presence of left artificial shoulder joint: Secondary | ICD-10-CM | POA: Diagnosis not present

## 2016-02-10 DIAGNOSIS — I10 Essential (primary) hypertension: Secondary | ICD-10-CM | POA: Diagnosis not present

## 2016-02-10 DIAGNOSIS — J449 Chronic obstructive pulmonary disease, unspecified: Secondary | ICD-10-CM | POA: Diagnosis not present

## 2016-02-10 DIAGNOSIS — I739 Peripheral vascular disease, unspecified: Secondary | ICD-10-CM | POA: Diagnosis not present

## 2016-02-10 DIAGNOSIS — E785 Hyperlipidemia, unspecified: Secondary | ICD-10-CM | POA: Diagnosis not present

## 2016-02-10 DIAGNOSIS — Z471 Aftercare following joint replacement surgery: Secondary | ICD-10-CM | POA: Diagnosis not present

## 2016-02-24 DIAGNOSIS — Z96612 Presence of left artificial shoulder joint: Secondary | ICD-10-CM | POA: Diagnosis not present

## 2016-02-24 DIAGNOSIS — Z471 Aftercare following joint replacement surgery: Secondary | ICD-10-CM | POA: Diagnosis not present

## 2016-03-02 DIAGNOSIS — Z96642 Presence of left artificial hip joint: Secondary | ICD-10-CM | POA: Diagnosis not present

## 2016-03-02 DIAGNOSIS — M25512 Pain in left shoulder: Secondary | ICD-10-CM | POA: Diagnosis not present

## 2016-03-04 DIAGNOSIS — Z96642 Presence of left artificial hip joint: Secondary | ICD-10-CM | POA: Diagnosis not present

## 2016-03-04 DIAGNOSIS — M25512 Pain in left shoulder: Secondary | ICD-10-CM | POA: Diagnosis not present

## 2016-03-08 DIAGNOSIS — M25512 Pain in left shoulder: Secondary | ICD-10-CM | POA: Diagnosis not present

## 2016-03-08 DIAGNOSIS — Z96642 Presence of left artificial hip joint: Secondary | ICD-10-CM | POA: Diagnosis not present

## 2016-03-10 DIAGNOSIS — M25512 Pain in left shoulder: Secondary | ICD-10-CM | POA: Diagnosis not present

## 2016-03-10 DIAGNOSIS — Z96642 Presence of left artificial hip joint: Secondary | ICD-10-CM | POA: Diagnosis not present

## 2016-03-15 ENCOUNTER — Other Ambulatory Visit: Payer: Self-pay | Admitting: Cardiovascular Disease

## 2016-03-18 DIAGNOSIS — M25512 Pain in left shoulder: Secondary | ICD-10-CM | POA: Diagnosis not present

## 2016-03-18 DIAGNOSIS — Z96642 Presence of left artificial hip joint: Secondary | ICD-10-CM | POA: Diagnosis not present

## 2016-03-22 DIAGNOSIS — M25512 Pain in left shoulder: Secondary | ICD-10-CM | POA: Diagnosis not present

## 2016-03-22 DIAGNOSIS — Z96642 Presence of left artificial hip joint: Secondary | ICD-10-CM | POA: Diagnosis not present

## 2016-03-24 DIAGNOSIS — M25512 Pain in left shoulder: Secondary | ICD-10-CM | POA: Diagnosis not present

## 2016-03-24 DIAGNOSIS — Z96642 Presence of left artificial hip joint: Secondary | ICD-10-CM | POA: Diagnosis not present

## 2016-03-25 DIAGNOSIS — Z96612 Presence of left artificial shoulder joint: Secondary | ICD-10-CM | POA: Diagnosis not present

## 2016-03-25 DIAGNOSIS — Z471 Aftercare following joint replacement surgery: Secondary | ICD-10-CM | POA: Diagnosis not present

## 2016-03-29 DIAGNOSIS — M25512 Pain in left shoulder: Secondary | ICD-10-CM | POA: Diagnosis not present

## 2016-03-29 DIAGNOSIS — Z96642 Presence of left artificial hip joint: Secondary | ICD-10-CM | POA: Diagnosis not present

## 2016-03-31 DIAGNOSIS — M25512 Pain in left shoulder: Secondary | ICD-10-CM | POA: Diagnosis not present

## 2016-03-31 DIAGNOSIS — Z96642 Presence of left artificial hip joint: Secondary | ICD-10-CM | POA: Diagnosis not present

## 2016-04-04 ENCOUNTER — Telehealth: Payer: Self-pay | Admitting: Family Medicine

## 2016-04-04 NOTE — Telephone Encounter (Signed)
Routing to provider  

## 2016-04-04 NOTE — Telephone Encounter (Signed)
Patient called requesting to have her thyroid rechecked to see if she could be switched from Synthroid to the generic form of this medication. Please advise if I need to schedule her an appt  Thank you  [phone]

## 2016-04-05 ENCOUNTER — Encounter: Payer: Self-pay | Admitting: Cardiovascular Disease

## 2016-04-05 ENCOUNTER — Ambulatory Visit (INDEPENDENT_AMBULATORY_CARE_PROVIDER_SITE_OTHER): Payer: Medicare Other | Admitting: Cardiovascular Disease

## 2016-04-05 VITALS — BP 122/62 | HR 87 | Ht 65.0 in | Wt 190.2 lb

## 2016-04-05 DIAGNOSIS — I10 Essential (primary) hypertension: Secondary | ICD-10-CM

## 2016-04-05 DIAGNOSIS — Z23 Encounter for immunization: Secondary | ICD-10-CM

## 2016-04-05 DIAGNOSIS — I779 Disorder of arteries and arterioles, unspecified: Secondary | ICD-10-CM

## 2016-04-05 DIAGNOSIS — E782 Mixed hyperlipidemia: Secondary | ICD-10-CM

## 2016-04-05 MED ORDER — CILOSTAZOL 50 MG PO TABS: 50.0000 mg | ORAL_TABLET | Freq: Two times a day (BID) | ORAL | 5 refills | Status: DC

## 2016-04-05 NOTE — Patient Instructions (Signed)
Medication Instructions:  Your physician has recommended you make the following change in your medication:  START taking pletal 50mg  twice daily   Labwork: none  Testing/Procedures: none  Follow-Up: Your physician recommends that you schedule a follow-up appointment in: 4 months with Dr. Fletcher Anon.    Any Other Special Instructions Will Be Listed Below (If Applicable).     If you need a refill on your cardiac medications before your next appointment, please call your pharmacy.

## 2016-04-05 NOTE — Progress Notes (Signed)
Cardiology Office Note   Date:  04/05/2016   ID:  Bailey Ayala, DOB 01-09-1941, MRN [id number]  PCP:  Golden Pop, MD  Cardiologist:   Kathlyn Sacramento, MD   Chief Complaint  Patient presents with  . other    4 month follow up. Meds reviewed by the pt. verbally. "doing well."       History of Present Illness: Bailey Ayala is a 75 y.o. female who is here today for follow-up visit  peripheral arterial disease. She has chronic medical conditions that include peripheral arterial disease with intermittent claudication, previous tobacco use, hyperlipidemia, COPD and hypertension.  She had a nuclear stress test done in July 2017 for exertional dyspnea which showed no evidence of ischemia with normal ejection fraction. The patient has severe left calf claudication. Noninvasive vascular evaluation showed an ABI of 0.57 on the right and 0.49 on the left. Duplex showed occlusion of the proximal to mid left SFA and moderate to borderline significant right SFA disease. Claudication happens after walking less than half a block. She also describes cramps at night. No lower extremity ulcerations or wounds. Symptoms are worse on the left. She underwent recent left shoulder surgery. She reports stable claudication and no chest pain.    Past Medical History:  Diagnosis Date  . Arthritis   . Blood dyscrasia    BLEEDS EASILY   . COPD (chronic obstructive pulmonary disease) ([place])   . Diffuse cystic mastopathy   . Hyperlipidemia   . Hypertension   . L4-L5 disc bulge   . PAD (peripheral artery disease) ([place])   . Shortness of breath dyspnea    WITH EXERTION   . Stroke ([place])    TIA     2016     WAS FOUND ON SCAN ORDERED FROM NEUROL  . Thyroid disease   . TIA (transient ischemic attack) 2016   left hand weakness    Past Surgical History:  Procedure Laterality Date  . ABDOMINAL HYSTERECTOMY    . BACK SURGERY  2011  . COLONOSCOPY  1998  . EYE SURGERY Bilateral 05/06/14  . JOINT  REPLACEMENT     LT KNEE  . KNEE SURGERY Left 2012  . parathyroid transplant     2/3 THYROID REMOVED   ([place])  . SPINE SURGERY    . TONSILLECTOMY    . TOTAL SHOULDER ARTHROPLASTY Left 01/14/2016   Procedure: LEFT TOTAL SHOULDER ARTHROPLASTY;  Surgeon: Justice Britain, MD;  Location: [facility];  Service: Orthopedics;  Laterality: Left;  . ULNAR NERVE REPAIR     LEFT  . vein closure procedure Left 2010     Current Outpatient Prescriptions  Medication Sig Dispense Refill  . atorvastatin (LIPITOR) 20 MG tablet TAKE 1 TABLET(20 MG) BY MOUTH DAILY 30 tablet 3  . benazepril (LOTENSIN) 20 MG tablet Take 1 tablet (20 mg total) by mouth daily. 30 tablet 5  . levothyroxine (SYNTHROID, LEVOTHROID) 112 MCG tablet Take 1 tablet (112 mcg total) by mouth daily. 30 tablet 5  . Multiple Vitamins-Minerals (CENTRUM SILVER ULTRA WOMENS PO) Take 1 tablet by mouth daily.     . cilostazol (PLETAL) 50 MG tablet Take 1 tablet (50 mg total) by mouth 2 (two) times daily. 60 tablet 5   No current facility-administered medications for this visit.     Allergies:   Codeine and Simvastatin    Social History:  The patient  reports that she quit smoking about 26 years ago. She has never used smokeless tobacco.  She reports that she drinks alcohol. She reports that she does not use drugs.   Family History:  The patient's family history includes Arthritis in her mother; Heart disease in her father and mother; Stroke in her mother.    ROS:  Please see the history of present illness.   Otherwise, review of systems are positive for none.   All other systems are reviewed and negative.    PHYSICAL EXAM: VS:  BP 122/62 (BP Location: Right Arm, Patient Position: Sitting, Cuff Size: Normal)   Pulse 87   Ht 5\' 5"  (1.651 m)   Wt 190 lb 4 oz (86.3 kg)   BMI 31.66 kg/m  , BMI Body mass index is 31.66 kg/m. GEN: Well nourished, well developed, in no acute distress  HEENT: normal  Neck: no JVD, carotid bruits, or  masses Cardiac: RRR; no murmurs, rubs, or gallops,no edema  Respiratory:  clear to auscultation bilaterally, normal work of breathing GI: soft, nontender, nondistended, + BS MS: no deformity or atrophy  Skin: warm and dry, no rash Neuro:  Strength and sensation are intact Psych: euthymic mood, full affect   EKG:  EKG is  ordered today.  EKG showed normal sinus rhythm with nonspecific ST changes.  Recent Labs: 09/29/2015: TSH 1.020 12/17/2015: ALT 18 01/12/2016: BUN 27; Creatinine, Ser 1.33; Hemoglobin 11.3; Platelets 199; Potassium 4.5; Sodium 137    Lipid Panel    Component Value Date/Time   CHOL 170 12/17/2015 0850   CHOL 211 (H) 01/13/2015 0955   TRIG 213 (H) 12/17/2015 0850   TRIG 290 (H) 01/13/2015 0955   HDL 43 12/17/2015 0850   CHOLHDL 4.0 12/17/2015 0850   VLDL 58 (H) 01/13/2015 0955   LDLCALC 84 12/17/2015 0850      Wt Readings from Last 3 Encounters:  04/05/16 190 lb 4 oz (86.3 kg)  01/14/16 191 lb (86.6 kg)  01/12/16 191 lb 8 oz (86.9 kg)      ASSESSMENT AND PLAN:   1. Peripheral arterial disease with severe left calf claudication:   She has an occluded left SFA and she reports stable symptoms overall. I again discussed with her different management options including medical therapy versus attempted revascularization. I elected to add cilostazol 50 mg twice daily. She might benefit from a structured exercise program at cardiac rehabilitation once we have that in place.  3. Hyperlipidemia: Continue treatment with atorvastatin. Most recent lipid profile in August showed improvement in LDL to 84. Triglycerides improved from 402 to 213.   4. Essential hypertension: Blood pressure is controlled on current medication.    Disposition:   FU with me in 4 months.  Signed, Kathlyn Sacramento, MD  04/05/2016 5:21 PM    [place]

## 2016-04-06 ENCOUNTER — Encounter: Payer: Self-pay | Admitting: Family Medicine

## 2016-04-06 ENCOUNTER — Ambulatory Visit (INDEPENDENT_AMBULATORY_CARE_PROVIDER_SITE_OTHER): Payer: Medicare Other | Admitting: Family Medicine

## 2016-04-06 VITALS — BP 138/66 | HR 56 | Temp 97.2°F | Ht 65.0 in | Wt 190.4 lb

## 2016-04-06 DIAGNOSIS — I779 Disorder of arteries and arterioles, unspecified: Secondary | ICD-10-CM | POA: Diagnosis not present

## 2016-04-06 DIAGNOSIS — E039 Hypothyroidism, unspecified: Secondary | ICD-10-CM

## 2016-04-06 DIAGNOSIS — E782 Mixed hyperlipidemia: Secondary | ICD-10-CM | POA: Diagnosis not present

## 2016-04-06 DIAGNOSIS — Z0000 Encounter for general adult medical examination without abnormal findings: Secondary | ICD-10-CM

## 2016-04-06 DIAGNOSIS — I10 Essential (primary) hypertension: Secondary | ICD-10-CM | POA: Diagnosis not present

## 2016-04-06 MED ORDER — LEVOTHYROXINE SODIUM 112 MCG PO TABS: 112.0000 ug | ORAL_TABLET | Freq: Every day | ORAL | 12 refills | Status: DC

## 2016-04-06 MED ORDER — BENAZEPRIL HCL 20 MG PO TABS: 20.0000 mg | ORAL_TABLET | Freq: Every day | ORAL | 12 refills | Status: DC

## 2016-04-06 NOTE — Assessment & Plan Note (Signed)
Discussed medication well okay generic

## 2016-04-06 NOTE — Assessment & Plan Note (Signed)
The current medical regimen is effective;  continue present plan and medications.  

## 2016-04-06 NOTE — Progress Notes (Signed)
BP 138/66 (BP Location: Left Arm, Patient Position: Sitting, Cuff Size: Normal)   Pulse (!) 56   Temp 97.2 F (36.2 C)   Ht _0  (1.651 m)   Wt 190 lb 6.4 oz (86.4 kg)   SpO2 98%   BMI 31.68 kg/m    Subjective:    Patient ID: Bailey Ayala, female    DOB: 05-13-1940, 75 y.o.   MRN: [id number]  HPI: Bailey Ayala is a 75 y.o. female  Chief Complaint  Patient presents with  . Thyroid Problem  Annual exam AWV metrics met  Patient with multiple medical problems working with cardiology for coronary artery disease and TIA stroke. Patient's recently discovered peripheral artery disease and being followed by cardiology. Patient's doing okay arteriogram was showing collateral veins developing. Patient's breathing doing okay Heather shoulder replace this early fall is doing okay with that. Kind of discussed it with thyroid was looking to go on generic and was having a hard time getting up from the drugstore will try again. No complaints from medications. Blood pressure doing well No issues with medications.  Relevant past medical, surgical, family and social history reviewed and updated as indicated. Interim medical history since our last visit reviewed. Allergies and medications reviewed and updated.  Other than above  Review of Systems  Constitutional: Negative.   HENT: Negative.   Eyes: Negative.   Respiratory: Negative.   Cardiovascular: Negative.   Gastrointestinal: Negative.   Endocrine: Negative.   Genitourinary: Negative.   Musculoskeletal: Negative.   Skin: Negative.   Allergic/Immunologic: Negative.   Neurological: Negative.   Hematological: Negative.   Psychiatric/Behavioral: Negative.     Per HPI unless specifically indicated above     Objective:    BP 138/66 (BP Location: Left Arm, Patient Position: Sitting, Cuff Size: Normal)   Pulse (!) 56   Temp 97.2 F (36.2 C)   Ht _1  (1.651 m)   Wt 190 lb 6.4 oz (86.4 kg)   SpO2 98%   BMI 31.68  kg/m   Wt Readings from Last 3 Encounters:  04/06/16 190 lb 6.4 oz (86.4 kg)  04/05/16 190 lb 4 oz (86.3 kg)  01/14/16 191 lb (86.6 kg)    Physical Exam  Constitutional: She is oriented to person, place, and time. She appears well-developed and well-nourished.  HENT:  Head: Normocephalic and atraumatic.  Right Ear: External ear normal.  Left Ear: External ear normal.  Nose: Nose normal.  Mouth/Throat: Oropharynx is clear and moist.  Eyes: Conjunctivae and EOM are normal. Pupils are equal, round, and reactive to light.  Neck: Normal range of motion. Neck supple. Carotid bruit is not present.  Cardiovascular: Normal rate, regular rhythm and normal heart sounds.   No murmur heard. Pulmonary/Chest: Effort normal and breath sounds normal. She exhibits no mass. Right breast exhibits no mass, no skin change and no tenderness. Left breast exhibits no mass, no skin change and no tenderness. Breasts are symmetrical.  Abdominal: Soft. Bowel sounds are normal. There is no hepatosplenomegaly.  Musculoskeletal: Normal range of motion.  Neurological: She is alert and oriented to person, place, and time.  Skin: No rash noted.  Psychiatric: She has a normal mood and affect. Her behavior is normal. Judgment and thought content normal.    Results for orders placed or performed during the hospital encounter of 01/12/16  Surgical pcr screen  Result Value Ref Range   MRSA, PCR NEGATIVE NEGATIVE   Staphylococcus aureus NEGATIVE NEGATIVE  CBC  Result  Value Ref Range   WBC 5.4 4.0 - 10.5 K/uL   RBC 3.44 (L) 3.87 - 5.11 MIL/uL   Hemoglobin 11.3 (L) 12.0 - 15.0 g/dL   HCT 35.2 (L) 36.0 - 46.0 %   MCV 102.3 (H) 78.0 - 100.0 fL   MCH 32.8 26.0 - 34.0 pg   MCHC 32.1 30.0 - 36.0 g/dL   RDW 13.4 11.5 - 15.5 %   Platelets 199 150 - 400 K/uL  Basic metabolic panel  Result Value Ref Range   Sodium 137 135 - 145 mmol/L   Potassium 4.5 3.5 - 5.1 mmol/L   Chloride 107 101 - 111 mmol/L   CO2 18 (L) 22 - 32  mmol/L   Glucose, Bld 91 65 - 99 mg/dL   BUN 27 (H) 6 - 20 mg/dL   Creatinine, Ser 1.33 (H) 0.44 - 1.00 mg/dL   Calcium 9.2 8.9 - 10.3 mg/dL   GFR calc non Af Amer 38 (L) >60 mL/min   GFR calc Af Amer 44 (L) >60 mL/min   Anion gap 12 5 - 15      Assessment & Plan:   Problem List Items Addressed This Visit      Cardiovascular and Mediastinum   Peripheral arterial occlusive disease (HCC)    The current medical regimen is effective;  continue present plan and medications.       Relevant Medications   benazepril (LOTENSIN) 20 MG tablet   Other Relevant Orders   Comprehensive metabolic panel   CBC with Differential/Platelet   Hypertension    The current medical regimen is effective;  continue present plan and medications.       Relevant Medications   benazepril (LOTENSIN) 20 MG tablet   Other Relevant Orders   Comprehensive metabolic panel   CBC with Differential/Platelet   Urinalysis, Routine w reflex microscopic     Endocrine   Hypothyroid    Discussed medication well okay generic      Relevant Medications   levothyroxine (SYNTHROID, LEVOTHROID) 112 MCG tablet   Other Relevant Orders   CBC with Differential/Platelet   TSH     Other   Hyperlipidemia    The current medical regimen is effective;  continue present plan and medications.       Relevant Medications   benazepril (LOTENSIN) 20 MG tablet   Other Relevant Orders   Lipid panel    Other Visit Diagnoses    PE (physical exam), annual    -  Primary      Refuses colonoscopy and other screening for colon cancer.  Follow up plan: Return in about 6 months (around 10/05/2016) for BMP,  Lipids, ALT, AST.

## 2016-04-07 ENCOUNTER — Encounter: Payer: Self-pay | Admitting: Family Medicine

## 2016-04-07 DIAGNOSIS — Z96642 Presence of left artificial hip joint: Secondary | ICD-10-CM | POA: Diagnosis not present

## 2016-04-07 DIAGNOSIS — M25512 Pain in left shoulder: Secondary | ICD-10-CM | POA: Diagnosis not present

## 2016-04-07 LAB — MICROSCOPIC EXAMINATION

## 2016-04-07 LAB — URINALYSIS, ROUTINE W REFLEX MICROSCOPIC
Bilirubin, UA: NEGATIVE
Glucose, UA: NEGATIVE
Ketones, UA: NEGATIVE
Nitrite, UA: NEGATIVE
Protein, UA: NEGATIVE
RBC, UA: NEGATIVE
Specific Gravity, UA: 1.02 (ref 1.005–1.030)
Urobilinogen, Ur: 0.2 mg/dL (ref 0.2–1.0)
pH, UA: 5.5 (ref 5.0–7.5)

## 2016-04-07 LAB — CBC WITH DIFFERENTIAL/PLATELET
Basophils Absolute: 0 10*3/uL (ref 0.0–0.2)
Basos: 0 %
EOS (ABSOLUTE): 0.4 10*3/uL (ref 0.0–0.4)
Eos: 6 %
Hematocrit: 34.3 % (ref 34.0–46.6)
Hemoglobin: 11.3 g/dL (ref 11.1–15.9)
Immature Grans (Abs): 0 10*3/uL (ref 0.0–0.1)
Immature Granulocytes: 0 %
Lymphocytes Absolute: 1.4 10*3/uL (ref 0.7–3.1)
Lymphs: 22 %
MCH: 31.9 pg (ref 26.6–33.0)
MCHC: 32.9 g/dL (ref 31.5–35.7)
MCV: 97 fL (ref 79–97)
Monocytes Absolute: 0.6 10*3/uL (ref 0.1–0.9)
Monocytes: 9 %
Neutrophils Absolute: 3.8 10*3/uL (ref 1.4–7.0)
Neutrophils: 63 %
Platelets: 225 10*3/uL (ref 150–379)
RBC: 3.54 x10E6/uL — ABNORMAL LOW (ref 3.77–5.28)
RDW: 13.8 % (ref 12.3–15.4)
WBC: 6.2 10*3/uL (ref 3.4–10.8)

## 2016-04-07 LAB — COMPREHENSIVE METABOLIC PANEL
ALT: 16 IU/L (ref 0–32)
AST: 16 IU/L (ref 0–40)
Albumin/Globulin Ratio: 1.5 (ref 1.2–2.2)
Albumin: 4.4 g/dL (ref 3.5–4.8)
Alkaline Phosphatase: 102 IU/L (ref 39–117)
BUN/Creatinine Ratio: 25 (ref 12–28)
BUN: 27 mg/dL (ref 8–27)
Bilirubin Total: <0.2 mg/dL (ref 0.0–1.2)
CO2: 26 mmol/L (ref 18–29)
Calcium: 9.6 mg/dL (ref 8.7–10.3)
Chloride: 98 mmol/L (ref 96–106)
Creatinine, Ser: 1.1 mg/dL — ABNORMAL HIGH (ref 0.57–1.00)
GFR calc Af Amer: 57 mL/min/{1.73_m2} — ABNORMAL LOW (ref >59)
GFR calc non Af Amer: 50 mL/min/{1.73_m2} — ABNORMAL LOW (ref >59)
Globulin, Total: 3 g/dL (ref 1.5–4.5)
Glucose: 107 mg/dL — ABNORMAL HIGH (ref 65–99)
Potassium: 4.6 mmol/L (ref 3.5–5.2)
Sodium: 140 mmol/L (ref 134–144)
Total Protein: 7.4 g/dL (ref 6.0–8.5)

## 2016-04-07 LAB — LIPID PANEL
Chol/HDL Ratio: 3.8 ratio units (ref 0.0–4.4)
Cholesterol, Total: 162 mg/dL (ref 100–199)
HDL: 43 mg/dL (ref >39)
LDL Calculated: 65 mg/dL (ref 0–99)
Triglycerides: 269 mg/dL — ABNORMAL HIGH (ref 0–149)
VLDL Cholesterol Cal: 54 mg/dL — ABNORMAL HIGH (ref 5–40)

## 2016-04-07 LAB — TSH: TSH: 1.29 u[IU]/mL (ref 0.450–4.500)

## 2016-04-11 ENCOUNTER — Telehealth: Payer: Self-pay | Admitting: Family Medicine

## 2016-04-11 NOTE — Telephone Encounter (Signed)
Call pt 

## 2016-04-12 DIAGNOSIS — Z96642 Presence of left artificial hip joint: Secondary | ICD-10-CM | POA: Diagnosis not present

## 2016-04-12 DIAGNOSIS — M25512 Pain in left shoulder: Secondary | ICD-10-CM | POA: Diagnosis not present

## 2016-04-14 DIAGNOSIS — M25512 Pain in left shoulder: Secondary | ICD-10-CM | POA: Diagnosis not present

## 2016-04-14 DIAGNOSIS — Z96642 Presence of left artificial hip joint: Secondary | ICD-10-CM | POA: Diagnosis not present

## 2016-04-20 DIAGNOSIS — Z96642 Presence of left artificial hip joint: Secondary | ICD-10-CM | POA: Diagnosis not present

## 2016-04-20 DIAGNOSIS — M25512 Pain in left shoulder: Secondary | ICD-10-CM | POA: Diagnosis not present

## 2016-04-21 DIAGNOSIS — Z96642 Presence of left artificial hip joint: Secondary | ICD-10-CM | POA: Diagnosis not present

## 2016-04-21 DIAGNOSIS — M25512 Pain in left shoulder: Secondary | ICD-10-CM | POA: Diagnosis not present

## 2016-05-26 DIAGNOSIS — H26493 Other secondary cataract, bilateral: Secondary | ICD-10-CM | POA: Diagnosis not present

## 2016-06-10 DIAGNOSIS — M19011 Primary osteoarthritis, right shoulder: Secondary | ICD-10-CM | POA: Diagnosis not present

## 2016-07-08 ENCOUNTER — Other Ambulatory Visit: Payer: Self-pay | Admitting: Cardiovascular Disease

## 2016-07-15 DIAGNOSIS — Z1231 Encounter for screening mammogram for malignant neoplasm of breast: Secondary | ICD-10-CM | POA: Diagnosis not present

## 2016-07-19 ENCOUNTER — Encounter: Payer: Self-pay | Admitting: *Deleted

## 2016-07-25 ENCOUNTER — Encounter: Payer: Self-pay | Admitting: General Surgery

## 2016-07-25 ENCOUNTER — Ambulatory Visit (INDEPENDENT_AMBULATORY_CARE_PROVIDER_SITE_OTHER): Payer: Medicare Other | Admitting: General Surgery

## 2016-07-25 VITALS — BP 122/68 | HR 68 | Resp 14 | Ht 64.0 in | Wt 179.0 lb

## 2016-07-25 DIAGNOSIS — N6011 Diffuse cystic mastopathy of right breast: Secondary | ICD-10-CM

## 2016-07-25 DIAGNOSIS — N6012 Diffuse cystic mastopathy of left breast: Secondary | ICD-10-CM

## 2016-07-25 NOTE — Progress Notes (Signed)
Patient ID: Bailey Ayala, female   DOB: 01-01-1941, 76 y.o.   MRN: [id number]  Chief Complaint  Patient presents with  . Follow-up    HPI Bailey Ayala is a 76 y.o. female who presents for a breast evaluation. The most recent mammogram was done on 07/14/2016.  Patient does perform regular self breast checks and gets regular mammograms done.  Patient states she has had  Problems with her left leg.  I have reviewed the history of present illness with the patient.  HPI  Past Medical History:  Diagnosis Date  . Arthritis   . Blood dyscrasia    BLEEDS EASILY   . COPD (chronic obstructive pulmonary disease) (Bailey Ayala)   . Diffuse cystic mastopathy   . Hyperlipidemia   . Hypertension   . L4-L5 disc bulge   . PAD (peripheral artery disease) (Bailey Ayala)   . Shortness of breath dyspnea    WITH EXERTION   . Stroke (Bailey Ayala)    TIA     2016     WAS FOUND ON SCAN ORDERED FROM NEUROL  . Thyroid disease   . TIA (transient ischemic attack) 2016   left hand weakness    Past Surgical History:  Procedure Laterality Date  . ABDOMINAL HYSTERECTOMY    . BACK SURGERY  2011  . COLONOSCOPY  1998  . EYE SURGERY Bilateral 05/06/14  . JOINT REPLACEMENT     LT KNEE  . KNEE SURGERY Left 2012  . parathyroid transplant     2/3 THYROID REMOVED   (GOITER)  . SPINE SURGERY    . TONSILLECTOMY    . TOTAL SHOULDER ARTHROPLASTY Left 01/14/2016   Procedure: LEFT TOTAL SHOULDER ARTHROPLASTY;  Surgeon: Bailey Britain, MD;  Location: [facility];  Service: Orthopedics;  Laterality: Left;  . ULNAR NERVE REPAIR     LEFT  . vein closure procedure Left 2010    Family History  Problem Relation Age of Onset  . Heart disease Mother     MI  . Arthritis Mother   . Stroke Mother   . Heart disease Father     MI    Social History Social History  Substance Use Topics  . Smoking status: Former Smoker    Quit date: 11/03/1989  . Smokeless tobacco: Never Used  . Alcohol use 0.0 oz/week     Comment: DAILY  1-2 MIXED DAILY      Allergies  Allergen Reactions  . Codeine Other (See Comments)    hallucinations    . Simvastatin Diarrhea and Nausea Only    Current Outpatient Prescriptions  Medication Sig Dispense Refill  . benazepril (LOTENSIN) 20 MG tablet Take 1 tablet (20 mg total) by mouth daily. 30 tablet 12  . cilostazol (PLETAL) 50 MG tablet Take 1 tablet (50 mg total) by mouth 2 (two) times daily. 60 tablet 5  . levothyroxine (SYNTHROID, LEVOTHROID) 112 MCG tablet Take 1 tablet (112 mcg total) by mouth daily. 30 tablet 12  . Multiple Vitamins-Minerals (CENTRUM SILVER ULTRA WOMENS PO) Take 1 tablet by mouth daily.      No current facility-administered medications for this visit.     Review of Systems Review of Systems  Constitutional: Negative.   Respiratory: Negative.   Cardiovascular: Negative.     Blood pressure 122/68, pulse 68, resp. rate 14, height 5\' 4"  (1.626 m), weight 179 lb (81.2 kg).  Physical Exam Physical Exam  Constitutional: She is oriented to person, place, and time. She appears well-developed and well-nourished.  Eyes:  Conjunctivae are normal. No scleral icterus.  Neck: Neck supple.  Cardiovascular: Normal rate, regular rhythm and normal heart sounds.   Pulmonary/Chest: Effort normal and breath sounds normal. Right breast exhibits no inverted nipple, no mass, no nipple discharge, no skin change and no tenderness. Left breast exhibits no inverted nipple, no mass, no nipple discharge, no skin change and no tenderness. Breasts are symmetrical.  Abdominal: Soft. Bowel sounds are normal. There is no tenderness.  Lymphadenopathy:    She has no cervical adenopathy.    She has no axillary adenopathy.  Neurological: She is alert and oriented to person, place, and time.  Skin: Skin is warm and dry.    Data Reviewed Mammogram reviewed-stable  Assessment    Stable exam, Fibrocystic breast disease      Plan       Patient to follow up with her PCP for mammograms and breast  checks.  This information has been scribed by Gaspar Cola CMA.    Bailey Ayala G 07/27/2016, 8:42 AM

## 2016-07-25 NOTE — Patient Instructions (Signed)
Patient to follow up with her PCP for mammograms and breast checks.

## 2016-08-04 DIAGNOSIS — Z96659 Presence of unspecified artificial knee joint: Secondary | ICD-10-CM | POA: Insufficient documentation

## 2016-08-04 DIAGNOSIS — M1711 Unilateral primary osteoarthritis, right knee: Secondary | ICD-10-CM | POA: Diagnosis not present

## 2016-08-04 DIAGNOSIS — Z96652 Presence of left artificial knee joint: Secondary | ICD-10-CM | POA: Diagnosis not present

## 2016-08-04 DIAGNOSIS — M19011 Primary osteoarthritis, right shoulder: Secondary | ICD-10-CM | POA: Diagnosis not present

## 2016-08-04 DIAGNOSIS — M19019 Primary osteoarthritis, unspecified shoulder: Secondary | ICD-10-CM | POA: Insufficient documentation

## 2016-08-05 ENCOUNTER — Ambulatory Visit (INDEPENDENT_AMBULATORY_CARE_PROVIDER_SITE_OTHER): Payer: Medicare Other | Admitting: Cardiovascular Disease

## 2016-08-05 ENCOUNTER — Encounter: Payer: Self-pay | Admitting: Cardiovascular Disease

## 2016-08-05 VITALS — BP 140/76 | HR 80 | Ht 65.0 in | Wt 189.0 lb

## 2016-08-05 DIAGNOSIS — I10 Essential (primary) hypertension: Secondary | ICD-10-CM | POA: Diagnosis not present

## 2016-08-05 DIAGNOSIS — I779 Disorder of arteries and arterioles, unspecified: Secondary | ICD-10-CM

## 2016-08-05 DIAGNOSIS — E782 Mixed hyperlipidemia: Secondary | ICD-10-CM

## 2016-08-05 MED ORDER — ASPIRIN EC 81 MG PO TBEC: 81.0000 mg | DELAYED_RELEASE_TABLET | Freq: Every day | ORAL | 3 refills | Status: DC

## 2016-08-05 NOTE — Progress Notes (Signed)
Cardiology Office Note   Date:  08/05/2016   ID:  Bailey Ayala, DOB 04-Apr-1941, MRN [id number]  PCP:  Golden Pop, MD  Cardiologist:   Kathlyn Sacramento, MD   Chief Complaint  Patient presents with  . other    Patient c/o having a rash allover that didnt start until she started these medications. Meds reviewed verbally with patient.       History of Present Illness: Bailey Ayala is a 76 y.o. female who is here today for follow-up visit  peripheral arterial disease. She has chronic medical conditions that include peripheral arterial disease with intermittent claudication, previous tobacco use, hyperlipidemia, COPD and hypertension.  She had a nuclear stress test done in July 2017 for exertional dyspnea which showed no evidence of ischemia with normal ejection fraction. The patient has known history of bilateral calf claudication due to SFA disease. ABI from last year was 0.57 on the right and 0.49 on the left. Duplex showed occlusion of the proximal to mid left SFA and borderline significant right SFA disease.  During last visit, I started her on cilostazol but she developed an itchy rash since then which has not improved. She reports that she is able to walk for one quarter of a mile before having to stop and rest for few minutes. Chest pain or shortness of breath.    Past Medical History:  Diagnosis Date  . Arthritis   . Blood dyscrasia    BLEEDS EASILY   . COPD (chronic obstructive pulmonary disease) ([place])   . Diffuse cystic mastopathy   . Hyperlipidemia   . Hypertension   . L4-L5 disc bulge   . PAD (peripheral artery disease) ([place])   . Shortness of breath dyspnea    WITH EXERTION   . Stroke ([facility])    TIA     2016     WAS FOUND ON SCAN ORDERED FROM NEUROL  . Thyroid disease   . TIA (transient ischemic attack) 2016   left hand weakness    Past Surgical History:  Procedure Laterality Date  . ABDOMINAL HYSTERECTOMY    . BACK SURGERY  2011  . COLONOSCOPY   1998  . EYE SURGERY Bilateral 05/06/14  . JOINT REPLACEMENT     LT KNEE  . KNEE SURGERY Left 2012  . parathyroid transplant     2/3 THYROID REMOVED   (GOITER)  . SPINE SURGERY    . TONSILLECTOMY    . TOTAL SHOULDER ARTHROPLASTY Left 01/14/2016   Procedure: LEFT TOTAL SHOULDER ARTHROPLASTY;  Surgeon: Justice Britain, MD;  Location: [facility];  Service: Orthopedics;  Laterality: Left;  . ULNAR NERVE REPAIR     LEFT  . vein closure procedure Left 2010     Current Outpatient Prescriptions  Medication Sig Dispense Refill  . benazepril (LOTENSIN) 20 MG tablet Take 1 tablet (20 mg total) by mouth daily. 30 tablet 12  . cilostazol (PLETAL) 50 MG tablet Take 1 tablet (50 mg total) by mouth 2 (two) times daily. 60 tablet 5  . levothyroxine (SYNTHROID, LEVOTHROID) 112 MCG tablet Take 1 tablet (112 mcg total) by mouth daily. 30 tablet 12  . Multiple Vitamins-Minerals (CENTRUM SILVER ULTRA WOMENS PO) Take 1 tablet by mouth daily.      No current facility-administered medications for this visit.     Allergies:   Codeine and Simvastatin    Social History:  The patient  reports that she quit smoking about 26 years ago. She has never used smokeless  tobacco. She reports that she drinks alcohol. She reports that she does not use drugs.   Family History:  The patient's family history includes Arthritis in her mother; Heart disease in her father and mother; Stroke in her mother.    ROS:  Please see the history of present illness.   Otherwise, review of systems are positive for none.   All other systems are reviewed and negative.    PHYSICAL EXAM: VS:  BP 140/76 (BP Location: Left Arm, Patient Position: Sitting, Cuff Size: Normal)   Pulse 80   Ht 5\' 5"  (1.651 m)   Wt 189 lb (85.7 kg)   BMI 31.45 kg/m  , BMI Body mass index is 31.45 kg/m. GEN: Well nourished, well developed, in no acute distress  HEENT: normal  Neck: no JVD, carotid bruits, or masses Cardiac: RRR; no murmurs, rubs, or gallops,no  edema  Respiratory:  clear to auscultation bilaterally, normal work of breathing GI: soft, nontender, nondistended, + BS MS: no deformity or atrophy  Skin: warm and dry, no rash Neuro:  Strength and sensation are intact Psych: euthymic mood, full affect   EKG:  EKG is  ordered today.  EKG showed normal sinus rhythm with nonspecific ST changes.  Recent Labs: 01/12/2016: Hemoglobin 11.3 04/06/2016: ALT 16; BUN 27; Creatinine, Ser 1.10; Platelets 225; Potassium 4.6; Sodium 140; TSH 1.290    Lipid Panel    Component Value Date/Time   CHOL 162 04/06/2016 1618   CHOL 211 (H) 01/13/2015 0955   TRIG 269 (H) 04/06/2016 1618   TRIG 290 (H) 01/13/2015 0955   HDL 43 04/06/2016 1618   CHOLHDL 3.8 04/06/2016 1618   VLDL 58 (H) 01/13/2015 0955   LDLCALC 65 04/06/2016 1618      Wt Readings from Last 3 Encounters:  08/05/16 189 lb (85.7 kg)  07/25/16 179 lb (81.2 kg)  04/06/16 190 lb 6.4 oz (86.4 kg)      ASSESSMENT AND PLAN:   1. Peripheral arterial disease with Moderate bilateral calf claudication worse on the left side:   She is known to have bilateral SFA disease with occlusion on the left side.  It appears that she is allergic to cilostazol due to a rash. Thus, I discontinued the medication and asked her to start taking aspirin 81 mg once daily. The patient's symptoms have been stable and overall do not seem to be lifestyle limiting. Thus, I recommend continuing medical therapy.  2. Hyperlipidemia: Continue treatment with atorvastatin. Most recent lipid profile in December 2017 showed an LDL of 65 which is at target.  3. Essential hypertension: Blood pressure is controlled on current medication.    Disposition:   FU with me in 6 months.  Signed,  Kathlyn Sacramento, MD  08/05/2016 2:19 PM    [facility]

## 2016-08-05 NOTE — Patient Instructions (Signed)
Medication Instructions:  Your physician has recommended you make the following change in your medication:  STOP taking pletal START taking aspirin 81mg  once daily   Labwork: none  Testing/Procedures: none  Follow-Up: Your physician wants you to follow-up in: 6 months with Dr. Fletcher Anon.  You will receive a reminder letter in the mail two months in advance. If you don't receive a letter, please call our office to schedule the follow-up appointment.   Any Other Special Instructions Will Be Listed Below (If Applicable).     If you need a refill on your cardiac medications before your next appointment, please call your pharmacy.

## 2016-08-18 ENCOUNTER — Other Ambulatory Visit: Payer: Self-pay | Admitting: Cardiovascular Disease

## 2016-09-08 DIAGNOSIS — M19011 Primary osteoarthritis, right shoulder: Secondary | ICD-10-CM | POA: Diagnosis not present

## 2016-09-08 DIAGNOSIS — Z96612 Presence of left artificial shoulder joint: Secondary | ICD-10-CM | POA: Diagnosis not present

## 2016-09-08 DIAGNOSIS — Z471 Aftercare following joint replacement surgery: Secondary | ICD-10-CM | POA: Diagnosis not present

## 2016-10-06 ENCOUNTER — Encounter: Payer: Self-pay | Admitting: Family Medicine

## 2016-10-06 ENCOUNTER — Ambulatory Visit (INDEPENDENT_AMBULATORY_CARE_PROVIDER_SITE_OTHER): Payer: Medicare Other | Admitting: Family Medicine

## 2016-10-06 VITALS — BP 126/76 | HR 79 | Temp 98.0°F | Ht 65.0 in | Wt 190.3 lb

## 2016-10-06 DIAGNOSIS — I10 Essential (primary) hypertension: Secondary | ICD-10-CM

## 2016-10-06 DIAGNOSIS — E039 Hypothyroidism, unspecified: Secondary | ICD-10-CM

## 2016-10-06 DIAGNOSIS — I779 Disorder of arteries and arterioles, unspecified: Secondary | ICD-10-CM

## 2016-10-06 DIAGNOSIS — E785 Hyperlipidemia, unspecified: Secondary | ICD-10-CM

## 2016-10-06 LAB — LP+ALT+AST PICCOLO, WAIVED
ALT (SGPT) Piccolo, Waived: 15 U/L (ref 10–47)
AST (SGOT) Piccolo, Waived: 29 U/L (ref 11–38)
Chol/HDL Ratio Piccolo,Waive: 3.2 mg/dL
Cholesterol Piccolo, Waived: 156 mg/dL (ref <200)
HDL Chol Piccolo, Waived: 108 mg/dL (ref >59)
LDL Chol Calc Piccolo Waived: 68 mg/dL (ref <100)
Triglycerides Piccolo,Waived: 199 mg/dL — ABNORMAL HIGH (ref <150)
VLDL Chol Calc Piccolo,Waive: 40 mg/dL — ABNORMAL HIGH (ref <30)

## 2016-10-06 NOTE — Assessment & Plan Note (Signed)
The current medical regimen is effective;  continue present plan and medications.  

## 2016-10-06 NOTE — Progress Notes (Signed)
BP 126/76   Pulse 79   Temp 98 F (36.7 C)   Ht 5\' 5"  (1.651 m) Comment: pt had shoes on  Wt 190 lb 4.8 oz (86.3 kg) Comment: pt had shoes on  SpO2 96%   BMI 31.67 kg/m    Subjective:    Patient ID: Bailey Ayala, female    DOB: 08/27/1940, 76 y.o.   MRN: [id number]  HPI: Bailey Ayala is a 76 y.o. female  Chief Complaint  Patient presents with  . Hyperlipidemia  . Hypertension   Patient follow-up cholesterol hypertension doing well no complaints from medications. Also takes thyroid medicines without problems. Patient's biggest concern is back itching and dryness. Is wondering if one of the medicines is causing this problem.  Relevant past medical, surgical, family and social history reviewed and updated as indicated. Interim medical history since our last visit reviewed. Allergies and medications reviewed and updated.  Review of Systems  Constitutional: Negative.   Respiratory: Negative.   Cardiovascular: Negative.     Per HPI unless specifically indicated above     Objective:    BP 126/76   Pulse 79   Temp 98 F (36.7 C)   Ht 5\' 5"  (1.651 m) Comment: pt had shoes on  Wt 190 lb 4.8 oz (86.3 kg) Comment: pt had shoes on  SpO2 96%   BMI 31.67 kg/m   Wt Readings from Last 3 Encounters:  10/06/16 190 lb 4.8 oz (86.3 kg)  08/05/16 189 lb (85.7 kg)  07/25/16 179 lb (81.2 kg)    Physical Exam  Constitutional: She is oriented to person, place, and time. She appears well-developed and well-nourished.  HENT:  Head: Normocephalic and atraumatic.  Eyes: Conjunctivae and EOM are normal.  Neck: Normal range of motion.  Cardiovascular: Normal rate, regular rhythm and normal heart sounds.   Pulmonary/Chest: Effort normal and breath sounds normal.  Musculoskeletal: Normal range of motion.  Neurological: She is alert and oriented to person, place, and time.  Skin: No erythema.  Back area clear with some excoriations. Patient uses a back scratcher    Psychiatric: She has a normal mood and affect. Her behavior is normal. Judgment and thought content normal.    Results for orders placed or performed in visit on 04/06/16  Microscopic Examination  Result Value Ref Range   WBC, UA 6-10 (A) 0 - 5 /hpf   RBC, UA 0-2 0 - 2 /hpf   Epithelial Cells (non renal) 0-10 0 - 10 /hpf   Casts Present (A) None seen /lpf   Cast Type Hyaline casts N/A   Mucus, UA Present (A) Not Estab.   Bacteria, UA Few (A) None seen/Few  Comprehensive metabolic panel  Result Value Ref Range   Glucose 107 (H) 65 - 99 mg/dL   BUN 27 8 - 27 mg/dL   Creatinine, Ser 1.10 (H) 0.57 - 1.00 mg/dL   GFR calc non Af Amer 50 (L) >59 mL/min/1.73   GFR calc Af Amer 57 (L) >59 mL/min/1.73   BUN/Creatinine Ratio 25 12 - 28   Sodium 140 134 - 144 mmol/L   Potassium 4.6 3.5 - 5.2 mmol/L   Chloride 98 96 - 106 mmol/L   CO2 26 18 - 29 mmol/L   Calcium 9.6 8.7 - 10.3 mg/dL   Total Protein 7.4 6.0 - 8.5 g/dL   Albumin 4.4 3.5 - 4.8 g/dL   Globulin, Total 3.0 1.5 - 4.5 g/dL   Albumin/Globulin Ratio 1.5 1.2 - 2.2  Bilirubin Total <0.2 0.0 - 1.2 mg/dL   Alkaline Phosphatase 102 39 - 117 IU/L   AST 16 0 - 40 IU/L   ALT 16 0 - 32 IU/L  Lipid panel  Result Value Ref Range   Cholesterol, Total 162 100 - 199 mg/dL   Triglycerides 269 (H) 0 - 149 mg/dL   HDL 43 >39 mg/dL   VLDL Cholesterol Cal 54 (H) 5 - 40 mg/dL   LDL Calculated 65 0 - 99 mg/dL   Chol/HDL Ratio 3.8 0.0 - 4.4 ratio units  CBC with Differential/Platelet  Result Value Ref Range   WBC 6.2 3.4 - 10.8 x10E3/uL   RBC 3.54 (L) 3.77 - 5.28 x10E6/uL   Hemoglobin 11.3 11.1 - 15.9 g/dL   Hematocrit 34.3 34.0 - 46.6 %   MCV 97 79 - 97 fL   MCH 31.9 26.6 - 33.0 pg   MCHC 32.9 31.5 - 35.7 g/dL   RDW 13.8 12.3 - 15.4 %   Platelets 225 150 - 379 x10E3/uL   Neutrophils 63 Not Estab. %   Lymphs 22 Not Estab. %   Monocytes 9 Not Estab. %   Eos 6 Not Estab. %   Basos 0 Not Estab. %   Neutrophils Absolute 3.8 1.4 - 7.0  x10E3/uL   Lymphocytes Absolute 1.4 0.7 - 3.1 x10E3/uL   Monocytes Absolute 0.6 0.1 - 0.9 x10E3/uL   EOS (ABSOLUTE) 0.4 0.0 - 0.4 x10E3/uL   Basophils Absolute 0.0 0.0 - 0.2 x10E3/uL   Immature Granulocytes 0 Not Estab. %   Immature Grans (Abs) 0.0 0.0 - 0.1 x10E3/uL  TSH  Result Value Ref Range   TSH 1.290 0.450 - 4.500 uIU/mL  Urinalysis, Routine w reflex microscopic  Result Value Ref Range   Specific Gravity, UA 1.020 1.005 - 1.030   pH, UA 5.5 5.0 - 7.5   Color, UA Yellow Yellow   Appearance Ur Clear Clear   Leukocytes, UA 1+ (A) Negative   Protein, UA Negative Negative/Trace   Glucose, UA Negative Negative   Ketones, UA Negative Negative   RBC, UA Negative Negative   Bilirubin, UA Negative Negative   Urobilinogen, Ur 0.2 0.2 - 1.0 mg/dL   Nitrite, UA Negative Negative   Microscopic Examination See below:       Assessment & Plan:   Problem List Items Addressed This Visit      Cardiovascular and Mediastinum   Hypertension    The current medical regimen is effective;  continue present plan and medications.       Relevant Orders   Basic metabolic panel     Endocrine   Hypothyroid    The current medical regimen is effective;  continue present plan and medications.         Other   Hyperlipidemia - Primary    The current medical regimen is effective;  continue present plan and medications.       Relevant Orders   LP+ALT+AST [place], Waived     For back imaging discussed changing soaps fusion different kind of lotions and ways to apply lotions that she lives alone. Also discuss upcoming shoulder surgery and use of ibuprofen and concerned about kidney function will observe his blood work comes back.  Follow up plan: Return in about 6 months (around 04/07/2017) for Physical Exam.

## 2016-10-07 ENCOUNTER — Telehealth: Payer: Self-pay

## 2016-10-07 LAB — BASIC METABOLIC PANEL
BUN/Creatinine Ratio: 17 (ref 12–28)
BUN: 24 mg/dL (ref 8–27)
CO2: 23 mmol/L (ref 20–29)
Calcium: 9 mg/dL (ref 8.7–10.3)
Chloride: 102 mmol/L (ref 96–106)
Creatinine, Ser: 1.45 mg/dL — ABNORMAL HIGH (ref 0.57–1.00)
GFR calc Af Amer: 41 mL/min/{1.73_m2} — ABNORMAL LOW (ref >59)
GFR calc non Af Amer: 35 mL/min/{1.73_m2} — ABNORMAL LOW (ref >59)
Glucose: 88 mg/dL (ref 65–99)
Potassium: 4.3 mmol/L (ref 3.5–5.2)
Sodium: 139 mmol/L (ref 134–144)

## 2016-10-07 NOTE — Telephone Encounter (Signed)
Message relayed to patient. Verbalized understanding and denied questions.   

## 2016-10-07 NOTE — Telephone Encounter (Signed)
Left message on machine for pt to return call to the office.  

## 2016-10-07 NOTE — Telephone Encounter (Signed)
-----   Message from Guadalupe Maple, MD sent at 10/07/2016 11:19 AM EDT ----- Your blood work is stable

## 2016-10-24 ENCOUNTER — Encounter (HOSPITAL_COMMUNITY): Payer: Self-pay

## 2016-10-24 ENCOUNTER — Encounter (HOSPITAL_COMMUNITY)
Admission: RE | Admit: 2016-10-24 | Discharge: 2016-10-24 | Disposition: A | Discharge status: Home / Self Care | Payer: Medicare Other | Source: Ambulatory Visit | Attending: Orthopedic Surgery | Admitting: Orthopedic Surgery

## 2016-10-24 DIAGNOSIS — M19011 Primary osteoarthritis, right shoulder: Secondary | ICD-10-CM | POA: Insufficient documentation

## 2016-10-24 DIAGNOSIS — Z01818 Encounter for other preprocedural examination: Secondary | ICD-10-CM | POA: Diagnosis not present

## 2016-10-24 HISTORY — DX: Type 2 diabetes mellitus without complications: E11.9

## 2016-10-24 HISTORY — DX: Hypothyroidism, unspecified: E03.9

## 2016-10-24 LAB — BASIC METABOLIC PANEL
Anion gap: 9 (ref 5–15)
BUN: 30 mg/dL — ABNORMAL HIGH (ref 6–20)
CO2: 25 mmol/L (ref 22–32)
Calcium: 9.1 mg/dL (ref 8.9–10.3)
Chloride: 102 mmol/L (ref 101–111)
Creatinine, Ser: 1.62 mg/dL — ABNORMAL HIGH (ref 0.44–1.00)
GFR calc Af Amer: 35 mL/min — ABNORMAL LOW (ref >60)
GFR calc non Af Amer: 30 mL/min — ABNORMAL LOW (ref >60)
Glucose, Bld: 112 mg/dL — ABNORMAL HIGH (ref 65–99)
Potassium: 4.2 mmol/L (ref 3.5–5.1)
Sodium: 136 mmol/L (ref 135–145)

## 2016-10-24 LAB — SURGICAL PCR SCREEN
MRSA, PCR: NEGATIVE
Staphylococcus aureus: NEGATIVE

## 2016-10-24 LAB — CBC
HCT: 34.7 % — ABNORMAL LOW (ref 36.0–46.0)
Hemoglobin: 11.1 g/dL — ABNORMAL LOW (ref 12.0–15.0)
MCH: 32.6 pg (ref 26.0–34.0)
MCHC: 32 g/dL (ref 30.0–36.0)
MCV: 102.1 fL — ABNORMAL HIGH (ref 78.0–100.0)
Platelets: 229 10*3/uL (ref 150–400)
RBC: 3.4 MIL/uL — ABNORMAL LOW (ref 3.87–5.11)
RDW: 13.9 % (ref 11.5–15.5)
WBC: 6.5 10*3/uL (ref 4.0–10.5)

## 2016-10-24 NOTE — Pre-Procedure Instructions (Addendum)
Bailey Ayala  10/24/2016      [facility] - [place], [place] - [place] [address] Phone: [phone] Fax: [phone]  [facility], [place] AT [address] [place] [address] Phone: [phone] Fax: [phone]    Your procedure is scheduled on 11/03/16.  Report to [facility] Admitting at 815 A.M.  Call this number if you have problems the morning of surgery:  [phone]   Remember:  Do not eat food or drink liquids after midnight.  Take these medicines the morning of surgery with A SIP OF WATER      ,levothyroxine  STOP all herbel meds, nsaids (aleve,naproxen,advil,ibuprofen)7 days prior to surgery starting 10/27/16 including all vitamins/supplements, aspirin   Do not wear jewelry, make-up or nail polish.  Do not wear lotions, powders, or perfumes, or deoderant.  Do not shave 48 hours prior to surgery.  Men may shave face and neck.  Do not bring valuables to the hospital.  [facility] is not responsible for any belongings or valuables.  Contacts, dentures or bridgework may not be worn into surgery.  Leave your suitcase in the car.  After surgery it may be brought to your room.  For patients admitted to the hospital, discharge time will be determined by your treatment team.  Patients discharged the day of surgery will not be allowed to drive home.   Special instructions:   Special Instructions: [facility] - Preparing for Surgery  Before surgery, you can play an important role.  Because skin is not sterile, your skin needs to be as free of germs as possible.  You can reduce the number of germs on you skin by washing with CHG (chlorahexidine gluconate) soap before surgery.  CHG is an antiseptic cleaner which kills germs and bonds with the skin to continue killing germs even after washing.  Please DO NOT use if you have an  allergy to CHG or antibacterial soaps.  If your skin becomes reddened/irritated stop using the CHG and inform your nurse when you arrive at Short Stay.  Do not shave (including legs and underarms) for at least 48 hours prior to the first CHG shower.  You may shave your face.  Please follow these instructions carefully:   1.  Shower with CHG Soap the night before surgery and the morning of Surgery.  2.  If you choose to wash your hair, wash your hair first as usual with your normal shampoo.  3.  After you shampoo, rinse your hair and body thoroughly to remove the Shampoo.  4.  Use CHG as you would any other liquid soap.  You can apply chg directly  to the skin and wash gently with scrungie or a clean washcloth.  5.  Apply the CHG Soap to your body ONLY FROM THE NECK DOWN.  Do not use on open wounds or open sores.  Avoid contact with your eyes ears, mouth and genitals (private parts).  Wash genitals (private parts)       with your normal soap.  6.  Wash thoroughly, paying special attention to the area where your surgery will be performed.  7.  Thoroughly rinse your body with warm water from the neck down.  8.  DO NOT shower/wash with your normal soap after using and rinsing off the CHG Soap.  9.  Pat yourself dry with a clean towel.            10.  Wear clean pajamas.            11.  Place clean sheets on your bed the night of your first shower and do not sleep with pets.  Day of Surgery  Do not apply any lotions/deodorants the morning of surgery.  Please wear clean clothes to the hospital/surgery center.  Please read over the  fact sheets that you were given.

## 2016-10-27 ENCOUNTER — Ambulatory Visit (INDEPENDENT_AMBULATORY_CARE_PROVIDER_SITE_OTHER): Payer: Medicare Other

## 2016-10-27 VITALS — BP 118/68 | HR 72 | Temp 97.9°F | Resp 16 | Ht 65.0 in | Wt 187.6 lb

## 2016-10-27 DIAGNOSIS — Z0000 Encounter for general adult medical examination without abnormal findings: Secondary | ICD-10-CM | POA: Diagnosis not present

## 2016-10-27 NOTE — Progress Notes (Signed)
Anesthesia Chart Review:  Pt is a 76 year old female scheduled for R total shoulder arthroplasty on 11/03/2016 with Justice Britain, MD  - PCP is Golden Pop, MD - Sees cardiologist Kathlyn Sacramento, MD for PAD. Last office visit 08/05/16   PMH includes:  HTN, DM, hyperlipidemia, PAD, hypothyroidism, COPD, TIA.  Former smoker. BMI 31.5. S/p L shoulder arthroplasty 01/14/16.   Preoperative labs reviewed.  Cr 1.62, BUN 30. Prior recent labs at PCP's office showed Cr was 1.45, labeled "stable" by PCP.   - I spoke with pt by telephone.  She admits she could be dehydrated.  I have asked her to increase fluid/water intake.  I will also forward lab results to PCP for f/u. Will recheck BMET DOS.   EKG 08/05/16: NSR. Nonspecific ST changes.   Nuclear stress test 11/09/15:   There was no ST segment deviation noted during stress.  No T wave inversion was noted during stress.  The study is normal.  This is a low risk study.  The left ventricular ejection fraction is normal (55-65%).  If no changes, I anticipate pt can proceed with surgery as scheduled.   Willeen Cass, FNP-BC [facility] Short Stay Surgical Center/Anesthesiology Phone: [phone] 10/27/2016 11:59 AM

## 2016-10-27 NOTE — Progress Notes (Signed)
Subjective:   Bailey Ayala is a 76 y.o. female who presents for Medicare Annual (Subsequent) preventive examination.  Review of Systems:   Cardiac Risk Factors include: hypertension;dyslipidemia;advanced age (>56men, >65 women);obesity (BMI >30kg/m2)     Objective:     Vitals: BP 118/68 (BP Location: Left Arm, Patient Position: Sitting)   Pulse 72   Temp 97.9 F (36.6 C)   Resp 16   Ht 5\' 5"  (1.651 m)   Wt 187 lb 9.6 oz (85.1 kg)   BMI 31.22 kg/m   Body mass index is 31.22 kg/m.   Tobacco History  Smoking Status  . Former Smoker  . Quit date: 11/03/1989  Smokeless Tobacco  . Never Used     Counseling given: Not Answered   Past Medical History:  Diagnosis Date  . Arthritis   . Blood dyscrasia    BLEEDS EASILY   . COPD (chronic obstructive pulmonary disease) ([place])   . Diabetes mellitus without complication ([place])   . Diffuse cystic mastopathy   . Hyperlipidemia   . Hypertension   . Hypothyroidism   . L4-L5 disc bulge   . PAD (peripheral artery disease) ([place])   . Shortness of breath dyspnea    WITH EXERTION   . Stroke ([place])    TIA     2016     WAS FOUND ON SCAN ORDERED FROM NEUROL  . Thyroid disease   . TIA (transient ischemic attack) 2016   left hand weakness   Past Surgical History:  Procedure Laterality Date  . ABDOMINAL HYSTERECTOMY    . BACK SURGERY  2011  . COLONOSCOPY  1998  . EYE SURGERY Bilateral 05/06/14  . JOINT REPLACEMENT     LT KNEE  . KNEE SURGERY Left 2012  . parathyroid transplant     2/3 THYROID REMOVED   ([place])  . SPINE SURGERY    . TONSILLECTOMY    . TOTAL SHOULDER ARTHROPLASTY Left 01/14/2016   Procedure: LEFT TOTAL SHOULDER ARTHROPLASTY;  Surgeon: Justice Britain, MD;  Location: [facility];  Service: Orthopedics;  Laterality: Left;  . ULNAR NERVE REPAIR     LEFT  . vein closure procedure Left 2010   Family History  Problem Relation Age of Onset  . Heart disease Mother        MI  . Arthritis Mother   . Stroke Mother   .  Heart disease Father        MI   History  Sexual Activity  . Sexual activity: Not on file    Outpatient Encounter Prescriptions as of 10/27/2016  Medication Sig  . aspirin EC 81 MG tablet Take 1 tablet (81 mg total) by mouth daily.  Marland Kitchen atorvastatin (LIPITOR) 20 MG tablet TAKE 1 TABLET(20 MG) BY MOUTH DAILY  . benazepril (LOTENSIN) 20 MG tablet Take 1 tablet (20 mg total) by mouth daily.  Marland Kitchen ibuprofen (ADVIL,MOTRIN) 200 MG tablet Take 400 mg by mouth every 8 (eight) hours as needed for mild pain.  Marland Kitchen levothyroxine (SYNTHROID, LEVOTHROID) 112 MCG tablet Take 1 tablet (112 mcg total) by mouth daily.  . Multiple Vitamins-Minerals (CENTRUM SILVER ULTRA WOMENS PO) Take 1 tablet by mouth daily.   . traMADol (ULTRAM) 50 MG tablet Take 50 mg by mouth at bedtime as needed for moderate pain.   No facility-administered encounter medications on file as of 10/27/2016.     Activities of Daily Living In your present state of health, do you have any difficulty performing the following activities: 10/27/2016  10/24/2016  Hearing? Tempie Donning  Vision? Y N  Difficulty concentrating or making decisions? Y N  Walking or climbing stairs? Y Y  Dressing or bathing? N N  Doing errands, shopping? N N  Preparing Food and eating ? N -  Using the Toilet? N -  In the past six months, have you accidently leaked urine? N -  Do you have problems with loss of bowel control? N -  Managing your Medications? N -  Managing your Finances? N -  Housekeeping or managing your Housekeeping? N -  Some recent data might be hidden    Patient Care Team: Guadalupe Maple, MD as PCP - General (Family Medicine) Christene Lye, MD (General Surgery) Guadalupe Maple, MD (Family Medicine) Birder Robson, MD as Referring Physician (Ophthalmology) Vladimir Crofts, MD (Neurology)    Assessment:     Exercise Activities and Dietary recommendations Current Exercise Habits: The patient does not participate in regular exercise at  present, Exercise limited by: orthopedic condition(s)  Goals    . Increase water intake          Recommend drinking at least 4-5 glasses of water a day      Fall Risk Fall Risk  10/27/2016 10/06/2016 09/29/2015 04/17/2015  Falls in the past year? No Yes No No  Number falls in past yr: - 1 - -  Injury with Fall? - No - -   Depression Screen PHQ 2/9 Scores 10/27/2016 10/06/2016 09/29/2015  PHQ - 2 Score 0 0 0     Cognitive Function     6CIT Screen 10/27/2016  What Year? 0 points  What month? 0 points  What time? 0 points  Count back from 20 0 points  Months in reverse 0 points  Repeat phrase 0 points  Total Score 0    Immunization History  Administered Date(s) Administered  . Influenza,inj,Quad PF,36+ Mos 01/13/2015, 04/05/2016  . Pneumococcal Conjugate-13 09/29/2015  . Pneumococcal-Unspecified 08/21/2007  . Td 08/21/2007   Screening Tests Health Maintenance  Topic Date Due  . COLONOSCOPY  10/06/2017 (Originally 05/15/1991)  . INFLUENZA VACCINE  11/23/2016  . TETANUS/TDAP  08/20/2017  . DEXA SCAN  Completed  . PNA vac Low Risk Adult  Completed      Plan:    I have personally reviewed and addressed the Medicare Annual Wellness questionnaire and have noted the following in the patient's chart:  A. Medical and social history B. Use of alcohol, tobacco or illicit drugs  C. Current medications and supplements D. Functional ability and status E.  Nutritional status F.  Physical activity G. Advance directives H. List of other physicians I.  Hospitalizations, surgeries, and ER visits in previous 12 months J.  [place] such as hearing and vision if needed, cognitive and depression L. Referrals and appointments  In addition, I have reviewed and discussed with patient certain preventive protocols, quality metrics, and best practice recommendations. A written personalized care plan for preventive services as well as general preventive health recommendations were  provided to patient.   Signed,  Tyler Aas, LPN Nurse Health Advisor   MD Recommendations: none

## 2016-10-27 NOTE — Patient Instructions (Addendum)
Ms. Bailey Ayala , Thank you for taking time to come for your Medicare Wellness Visit. I appreciate your ongoing commitment to your health goals. Please review the following plan we discussed and let me know if I can assist you in the future.   Screening recommendations/referrals: Colonoscopy: No longer required Mammogram: No longer required Bone Density: completed 02/24/2009 Recommended yearly ophthalmology/optometry visit for glaucoma screening and checkup Recommended yearly dental visit for hygiene and checkup  Vaccinations: Influenza vaccine: up to date, due 12/2016 Pneumococcal vaccine: up to date Tdap vaccine: up to date Shingles vaccine: up to date, received at [facility] in Dec 2017  Advanced directives: Please bring a copy of your health care power of attorney and living will to the office at your convenience.  Conditions/risks identified: Recommend drinking at least 4-5 glasses of water a day  Next appointment: Follow up on 11/02/2016 at 2:00pm with Dr.Crissman. Follow up in one year for your annual wellness exam.    Preventive Care 65 Years and Older, Female Preventive care refers to lifestyle choices and visits with your health care provider that can promote health and wellness. What does preventive care include?  A yearly physical exam. This is also called an annual well check.  Dental exams once or twice a year.  Routine eye exams. Ask your health care provider how often you should have your eyes checked.  Personal lifestyle choices, including:  Daily care of your teeth and gums.  Regular physical activity.  Eating a healthy diet.  Avoiding tobacco and drug use.  Limiting alcohol use.  Practicing safe sex.  Taking low-dose aspirin every day.  Taking vitamin and mineral supplements as recommended by your health care provider. What happens during an annual well check? The services and screenings done by your health care provider during your annual well check  will depend on your age, overall health, lifestyle risk factors, and family history of disease. Counseling  Your health care provider may ask you questions about your:  Alcohol use.  Tobacco use.  Drug use.  Emotional well-being.  Home and relationship well-being.  Sexual activity.  Eating habits.  History of falls.  Memory and ability to understand (cognition).  Work and work Statistician.  Reproductive health. Screening  You may have the following tests or measurements:  Height, weight, and BMI.  Blood pressure.  Lipid and cholesterol levels. These may be checked every 5 years, or more frequently if you are over 46 years old.  Skin check.  Lung cancer screening. You may have this screening every year starting at age 16 if you have a 30-pack-year history of smoking and currently smoke or have quit within the past 15 years.  Fecal occult blood test (FOBT) of the stool. You may have this test every year starting at age 45.  Flexible sigmoidoscopy or colonoscopy. You may have a sigmoidoscopy every 5 years or a colonoscopy every 10 years starting at age 30.  Hepatitis C blood test.  Hepatitis B blood test.  Sexually transmitted disease (STD) testing.  Diabetes screening. This is done by checking your blood sugar (glucose) after you have not eaten for a while (fasting). You may have this done every 1-3 years.  Bone density scan. This is done to screen for osteoporosis. You may have this done starting at age 14.  Mammogram. This may be done every 1-2 years. Talk to your health care provider about how often you should have regular mammograms. Talk with your health care provider about your test results,  treatment options, and if necessary, the need for more tests. Vaccines  Your health care provider may recommend certain vaccines, such as:  Influenza vaccine. This is recommended every year.  Tetanus, diphtheria, and acellular pertussis (Tdap, Td) vaccine. You may  need a Td booster every 10 years.  Zoster vaccine. You may need this after age 21.  Pneumococcal 13-valent conjugate (PCV13) vaccine. One dose is recommended after age 49.  Pneumococcal polysaccharide (PPSV23) vaccine. One dose is recommended after age 30. Talk to your health care provider about which screenings and vaccines you need and how often you need them. This information is not intended to replace advice given to you by your health care provider. Make sure you discuss any questions you have with your health care provider. Document Released: 05/08/2015 Document Revised: 12/30/2015 Document Reviewed: 02/10/2015 Elsevier Interactive Patient Education  2017 [place] Prevention in the Home Falls can cause injuries. They can happen to people of all ages. There are many things you can do to make your home safe and to help prevent falls. What can I do on the outside of my home?  Regularly fix the edges of walkways and driveways and fix any cracks.  Remove anything that might make you trip as you walk through a door, such as a raised step or threshold.  Trim any bushes or trees on the path to your home.  Use bright outdoor lighting.  Clear any walking paths of anything that might make someone trip, such as rocks or tools.  Regularly check to see if handrails are loose or broken. Make sure that both sides of any steps have handrails.  Any raised decks and porches should have guardrails on the edges.  Have any leaves, snow, or ice cleared regularly.  Use sand or salt on walking paths during winter.  Clean up any spills in your garage right away. This includes oil or grease spills. What can I do in the bathroom?  Use night lights.  Install grab bars by the toilet and in the tub and shower. Do not use towel bars as grab bars.  Use non-skid mats or decals in the tub or shower.  If you need to sit down in the shower, use a plastic, non-slip stool.  Keep the floor  dry. Clean up any water that spills on the floor as soon as it happens.  Remove soap buildup in the tub or shower regularly.  Attach bath mats securely with double-sided non-slip rug tape.  Do not have throw rugs and other things on the floor that can make you trip. What can I do in the bedroom?  Use night lights.  Make sure that you have a light by your bed that is easy to reach.  Do not use any sheets or blankets that are too big for your bed. They should not hang down onto the floor.  Have a firm chair that has side arms. You can use this for support while you get dressed.  Do not have throw rugs and other things on the floor that can make you trip. What can I do in the kitchen?  Clean up any spills right away.  Avoid walking on wet floors.  Keep items that you use a lot in easy-to-reach places.  If you need to reach something above you, use a strong step stool that has a grab bar.  Keep electrical cords out of the way.  Do not use floor polish or wax that makes floors  slippery. If you must use wax, use non-skid floor wax.  Do not have throw rugs and other things on the floor that can make you trip. What can I do with my stairs?  Do not leave any items on the stairs.  Make sure that there are handrails on both sides of the stairs and use them. Fix handrails that are broken or loose. Make sure that handrails are as long as the stairways.  Check any carpeting to make sure that it is firmly attached to the stairs. Fix any carpet that is loose or worn.  Avoid having throw rugs at the top or bottom of the stairs. If you do have throw rugs, attach them to the floor with carpet tape.  Make sure that you have a light switch at the top of the stairs and the bottom of the stairs. If you do not have them, ask someone to add them for you. What else can I do to help prevent falls?  Wear shoes that:  Do not have high heels.  Have rubber bottoms.  Are comfortable and fit you  well.  Are closed at the toe. Do not wear sandals.  If you use a stepladder:  Make sure that it is fully opened. Do not climb a closed stepladder.  Make sure that both sides of the stepladder are locked into place.  Ask someone to hold it for you, if possible.  Clearly mark and make sure that you can see:  Any grab bars or handrails.  First and last steps.  Where the edge of each step is.  Use tools that help you move around (mobility aids) if they are needed. These include:  Canes.  Walkers.  Scooters.  Crutches.  Turn on the lights when you go into a dark area. Replace any light bulbs as soon as they burn out.  Set up your furniture so you have a clear path. Avoid moving your furniture around.  If any of your floors are uneven, fix them.  If there are any pets around you, be aware of where they are.  Review your medicines with your doctor. Some medicines can make you feel dizzy. This can increase your chance of falling. Ask your doctor what other things that you can do to help prevent falls. This information is not intended to replace advice given to you by your health care provider. Make sure you discuss any questions you have with your health care provider. Document Released: 02/05/2009 Document Revised: 09/17/2015 Document Reviewed: 05/16/2014 Elsevier Interactive Patient Education  2017 [employer].

## 2016-11-02 ENCOUNTER — Ambulatory Visit (INDEPENDENT_AMBULATORY_CARE_PROVIDER_SITE_OTHER): Payer: Medicare Other | Admitting: Family Medicine

## 2016-11-02 ENCOUNTER — Encounter: Payer: Self-pay | Admitting: Family Medicine

## 2016-11-02 VITALS — BP 134/62 | HR 90 | Wt 186.0 lb

## 2016-11-02 DIAGNOSIS — Z7189 Other specified counseling: Secondary | ICD-10-CM

## 2016-11-02 DIAGNOSIS — E039 Hypothyroidism, unspecified: Secondary | ICD-10-CM

## 2016-11-02 DIAGNOSIS — I779 Disorder of arteries and arterioles, unspecified: Secondary | ICD-10-CM

## 2016-11-02 DIAGNOSIS — I10 Essential (primary) hypertension: Secondary | ICD-10-CM | POA: Diagnosis not present

## 2016-11-02 DIAGNOSIS — E782 Mixed hyperlipidemia: Secondary | ICD-10-CM | POA: Diagnosis not present

## 2016-11-02 DIAGNOSIS — Z0000 Encounter for general adult medical examination without abnormal findings: Secondary | ICD-10-CM

## 2016-11-02 DIAGNOSIS — Z131 Encounter for screening for diabetes mellitus: Secondary | ICD-10-CM

## 2016-11-02 LAB — URINALYSIS, ROUTINE W REFLEX MICROSCOPIC
Bilirubin, UA: NEGATIVE
Glucose, UA: NEGATIVE
Ketones, UA: NEGATIVE
Nitrite, UA: NEGATIVE
Protein, UA: NEGATIVE
RBC, UA: NEGATIVE
Specific Gravity, UA: 1.02 (ref 1.005–1.030)
Urobilinogen, Ur: 0.2 mg/dL (ref 0.2–1.0)
pH, UA: 5 (ref 5.0–7.5)

## 2016-11-02 MED ORDER — BENAZEPRIL HCL 20 MG PO TABS: 20.0000 mg | ORAL_TABLET | Freq: Every day | ORAL | 12 refills | Status: DC

## 2016-11-02 MED ORDER — LEVOTHYROXINE SODIUM 112 MCG PO TABS: 112.0000 ug | ORAL_TABLET | Freq: Every day | ORAL | 12 refills | Status: DC

## 2016-11-02 MED ORDER — TRANEXAMIC ACID 1000 MG/10ML IV SOLN
1000.0000 mg | INTRAVENOUS | Status: AC
  Administered 2016-11-03: 1000 mg via INTRAVENOUS
  Filled 2016-11-02: qty 1100

## 2016-11-02 MED ORDER — CEFAZOLIN SODIUM-DEXTROSE 2-4 GM/100ML-% IV SOLN
2.0000 g | INTRAVENOUS | Status: AC
  Administered 2016-11-03: 2 g via INTRAVENOUS
  Filled 2016-11-02: qty 100

## 2016-11-02 NOTE — Assessment & Plan Note (Signed)
Patient with continued PAD symptoms which are claudication with walking any distance goes away with rest and then is able to resume walking for some time

## 2016-11-02 NOTE — Assessment & Plan Note (Signed)
The current medical regimen is effective;  continue present plan and medications.  

## 2016-11-02 NOTE — Assessment & Plan Note (Signed)
A voluntary discussion about advance care planning including the explanation and discussion of advance directives was extensively discussed  with the patient.  Explanation about the health care proxy and Living will was reviewed and packet with forms with explanation of how to fill them out was given.  During this discussion, the patient was able to identify a health care proxy as ** and plans/does not plan to fill out the paperwork required.  Patient was offered a separate [place] visit for further assistance with forms.   Copies made

## 2016-11-02 NOTE — Progress Notes (Signed)
BP 134/62   Pulse 90   Wt 186 lb (84.4 kg)   SpO2 100%   BMI 30.95 kg/m    Subjective:    Patient ID: Bailey Ayala, female    DOB: July 09, 1940, 76 y.o.   MRN: [id number]  HPI: Bailey Ayala is a 76 y.o. female  Chief Complaint  Patient presents with  . Annual Exam  Patient follow-up has shoulder replacement surgery scheduled for tomorrow. Patient's left shoulder was done earlier this year. Has done well. Patient's blood pressure cholesterol and thyroid all doing well with no complaints. Taking medications faithfully without problems. Patient noted to have CK D which is been getting worse. Due to shoulder problems has been taken Motrin sometimes 6 tablets a day patient has previously had ibuprofen and stopped it renal function improved significantly but his recent started it. Has not been taking ibuprofen for 2 weeks now. Having significant pain but scheduled for surgery tomorrow.  Relevant past medical, surgical, family and social history reviewed and updated as indicated. Interim medical history since our last visit reviewed. Allergies and medications reviewed and updated.  Review of Systems  Constitutional: Negative.   HENT: Negative.   Eyes: Negative.   Respiratory: Negative.   Cardiovascular: Negative.   Gastrointestinal: Negative.   Endocrine: Negative.   Genitourinary: Negative.   Musculoskeletal: Negative.   Skin: Negative.   Allergic/Immunologic: Negative.   Neurological: Negative.   Hematological: Negative.   Psychiatric/Behavioral: Negative.     Per HPI unless specifically indicated above     Objective:    BP 134/62   Pulse 90   Wt 186 lb (84.4 kg)   SpO2 100%   BMI 30.95 kg/m   Wt Readings from Last 3 Encounters:  11/02/16 186 lb (84.4 kg)  10/27/16 187 lb 9.6 oz (85.1 kg)  10/24/16 189 lb (85.7 kg)    Physical Exam  Constitutional: She is oriented to person, place, and time. She appears well-developed and well-nourished.  HENT:  Head:  Normocephalic and atraumatic.  Right Ear: External ear normal.  Left Ear: External ear normal.  Nose: Nose normal.  Mouth/Throat: Oropharynx is clear and moist.  Eyes: Conjunctivae and EOM are normal. Pupils are equal, round, and reactive to light.  Neck: Normal range of motion. Neck supple. Carotid bruit is not present.  Cardiovascular: Normal rate, regular rhythm and normal heart sounds.   No murmur heard. Pulmonary/Chest: Effort normal and breath sounds normal. She exhibits no mass. Right breast exhibits no mass, no skin change and no tenderness. Left breast exhibits no mass, no skin change and no tenderness. Breasts are symmetrical.  Abdominal: Soft. Bowel sounds are normal. There is no hepatosplenomegaly.  Musculoskeletal: Normal range of motion.  Absent pulses left leg  Neurological: She is alert and oriented to person, place, and time.  Skin: No rash noted.  Psychiatric: She has a normal mood and affect. Her behavior is normal. Judgment and thought content normal.    Results for orders placed or performed during the hospital encounter of 10/24/16  Surgical pcr screen  Result Value Ref Range   MRSA, PCR NEGATIVE NEGATIVE   Staphylococcus aureus NEGATIVE NEGATIVE  CBC  Result Value Ref Range   WBC 6.5 4.0 - 10.5 K/uL   RBC 3.40 (L) 3.87 - 5.11 MIL/uL   Hemoglobin 11.1 (L) 12.0 - 15.0 g/dL   HCT 34.7 (L) 36.0 - 46.0 %   MCV 102.1 (H) 78.0 - 100.0 fL   MCH 32.6 26.0 - 34.0  pg   MCHC 32.0 30.0 - 36.0 g/dL   RDW 13.9 11.5 - 15.5 %   Platelets 229 150 - 400 K/uL  Basic metabolic panel  Result Value Ref Range   Sodium 136 135 - 145 mmol/L   Potassium 4.2 3.5 - 5.1 mmol/L   Chloride 102 101 - 111 mmol/L   CO2 25 22 - 32 mmol/L   Glucose, Bld 112 (H) 65 - 99 mg/dL   BUN 30 (H) 6 - 20 mg/dL   Creatinine, Ser 1.62 (H) 0.44 - 1.00 mg/dL   Calcium 9.1 8.9 - 10.3 mg/dL   GFR calc non Af Amer 30 (L) >60 mL/min   GFR calc Af Amer 35 (L) >60 mL/min   Anion gap 9 5 - 15        Assessment & Plan:   Problem List Items Addressed This Visit      Cardiovascular and Mediastinum   Peripheral arterial occlusive disease ([place])    Patient with continued PAD symptoms which are claudication with walking any distance goes away with rest and then is able to resume walking for some time      Relevant Medications   benazepril (LOTENSIN) 20 MG tablet   Other Relevant Orders   CBC with Differential/Platelet   Comprehensive metabolic panel   Lipid panel   TSH   Urinalysis, Routine w reflex microscopic   Hypertension    The current medical regimen is effective;  continue present plan and medications.       Relevant Medications   benazepril (LOTENSIN) 20 MG tablet   Other Relevant Orders   CBC with Differential/Platelet   Comprehensive metabolic panel   TSH   Urinalysis, Routine w reflex microscopic     Endocrine   Hypothyroid    The current medical regimen is effective;  continue present plan and medications.       Relevant Medications   levothyroxine (SYNTHROID, LEVOTHROID) 112 MCG tablet   Other Relevant Orders   TSH     Other   Hyperlipidemia    The current medical regimen is effective;  continue present plan and medications.       Relevant Medications   benazepril (LOTENSIN) 20 MG tablet   Other Relevant Orders   Lipid panel   Urinalysis, Routine w reflex microscopic   Advanced care planning/counseling discussion    A voluntary discussion about advance care planning including the explanation and discussion of advance directives was extensively discussed  with the patient.  Explanation about the health care proxy and Living will was reviewed and packet with forms with explanation of how to fill them out was given.  During this discussion, the patient was able to identify a health care proxy as ** and plans/does not plan to fill out the paperwork required.  Patient was offered a separate [place] visit for further assistance with forms.    Copies made       Other Visit Diagnoses    Screening for diabetes mellitus (DM)    -  Primary   Relevant Orders   Comprehensive metabolic panel   Urinalysis, Routine w reflex microscopic   Encounter for Medicare annual wellness exam           Follow up plan: Return for BMP,  Lipids, ALT, AST.

## 2016-11-03 ENCOUNTER — Encounter (HOSPITAL_COMMUNITY): Payer: Self-pay | Admitting: *Deleted

## 2016-11-03 ENCOUNTER — Inpatient Hospital Stay (HOSPITAL_COMMUNITY): Payer: Medicare Other | Admitting: Emergency Medicine

## 2016-11-03 ENCOUNTER — Encounter (HOSPITAL_COMMUNITY)
Admission: RE | Disposition: A | Discharge status: Skilled Nursing Facility | Payer: Self-pay | Source: Ambulatory Visit | Attending: Orthopedic Surgery

## 2016-11-03 ENCOUNTER — Inpatient Hospital Stay (HOSPITAL_COMMUNITY)
Admission: RE | Admit: 2016-11-03 | Discharge: 2016-11-06 | DRG: 483 | Disposition: A | Discharge status: Skilled Nursing Facility | Payer: Medicare Other | Source: Ambulatory Visit | Attending: Orthopedic Surgery | Admitting: Orthopedic Surgery

## 2016-11-03 DIAGNOSIS — Z8249 Family history of ischemic heart disease and other diseases of the circulatory system: Secondary | ICD-10-CM

## 2016-11-03 DIAGNOSIS — I739 Peripheral vascular disease, unspecified: Secondary | ICD-10-CM | POA: Diagnosis not present

## 2016-11-03 DIAGNOSIS — G5622 Lesion of ulnar nerve, left upper limb: Secondary | ICD-10-CM | POA: Diagnosis not present

## 2016-11-03 DIAGNOSIS — Z471 Aftercare following joint replacement surgery: Secondary | ICD-10-CM | POA: Diagnosis not present

## 2016-11-03 DIAGNOSIS — Z7982 Long term (current) use of aspirin: Secondary | ICD-10-CM

## 2016-11-03 DIAGNOSIS — I10 Essential (primary) hypertension: Secondary | ICD-10-CM | POA: Diagnosis present

## 2016-11-03 DIAGNOSIS — Z8261 Family history of arthritis: Secondary | ICD-10-CM

## 2016-11-03 DIAGNOSIS — Z96619 Presence of unspecified artificial shoulder joint: Secondary | ICD-10-CM

## 2016-11-03 DIAGNOSIS — Z8673 Personal history of transient ischemic attack (TIA), and cerebral infarction without residual deficits: Secondary | ICD-10-CM

## 2016-11-03 DIAGNOSIS — Z79899 Other long term (current) drug therapy: Secondary | ICD-10-CM | POA: Diagnosis not present

## 2016-11-03 DIAGNOSIS — E1151 Type 2 diabetes mellitus with diabetic peripheral angiopathy without gangrene: Secondary | ICD-10-CM | POA: Diagnosis not present

## 2016-11-03 DIAGNOSIS — E785 Hyperlipidemia, unspecified: Secondary | ICD-10-CM | POA: Diagnosis present

## 2016-11-03 DIAGNOSIS — J449 Chronic obstructive pulmonary disease, unspecified: Secondary | ICD-10-CM | POA: Diagnosis present

## 2016-11-03 DIAGNOSIS — E039 Hypothyroidism, unspecified: Secondary | ICD-10-CM | POA: Diagnosis present

## 2016-11-03 DIAGNOSIS — M6281 Muscle weakness (generalized): Secondary | ICD-10-CM | POA: Diagnosis not present

## 2016-11-03 DIAGNOSIS — Z96611 Presence of right artificial shoulder joint: Secondary | ICD-10-CM | POA: Diagnosis not present

## 2016-11-03 DIAGNOSIS — G894 Chronic pain syndrome: Secondary | ICD-10-CM | POA: Diagnosis not present

## 2016-11-03 DIAGNOSIS — D759 Disease of blood and blood-forming organs, unspecified: Secondary | ICD-10-CM | POA: Diagnosis present

## 2016-11-03 DIAGNOSIS — Z87891 Personal history of nicotine dependence: Secondary | ICD-10-CM

## 2016-11-03 DIAGNOSIS — R262 Difficulty in walking, not elsewhere classified: Secondary | ICD-10-CM | POA: Diagnosis not present

## 2016-11-03 DIAGNOSIS — M19011 Primary osteoarthritis, right shoulder: Principal | ICD-10-CM | POA: Diagnosis present

## 2016-11-03 DIAGNOSIS — G8918 Other acute postprocedural pain: Secondary | ICD-10-CM | POA: Diagnosis not present

## 2016-11-03 DIAGNOSIS — Z823 Family history of stroke: Secondary | ICD-10-CM | POA: Diagnosis not present

## 2016-11-03 HISTORY — PX: TOTAL SHOULDER ARTHROPLASTY: SHX126

## 2016-11-03 LAB — COMPREHENSIVE METABOLIC PANEL
ALT: 18 IU/L (ref 0–32)
AST: 20 IU/L (ref 0–40)
Albumin/Globulin Ratio: 1.6 (ref 1.2–2.2)
Albumin: 4.7 g/dL (ref 3.5–4.8)
Alkaline Phosphatase: 88 IU/L (ref 39–117)
BUN/Creatinine Ratio: 19 (ref 12–28)
BUN: 30 mg/dL — ABNORMAL HIGH (ref 8–27)
Bilirubin Total: 0.2 mg/dL (ref 0.0–1.2)
CO2: 19 mmol/L — ABNORMAL LOW (ref 20–29)
Calcium: 9.4 mg/dL (ref 8.7–10.3)
Chloride: 101 mmol/L (ref 96–106)
Creatinine, Ser: 1.56 mg/dL — ABNORMAL HIGH (ref 0.57–1.00)
GFR calc Af Amer: 37 mL/min/{1.73_m2} — ABNORMAL LOW (ref >59)
GFR calc non Af Amer: 32 mL/min/{1.73_m2} — ABNORMAL LOW (ref >59)
Globulin, Total: 2.9 g/dL (ref 1.5–4.5)
Glucose: 105 mg/dL — ABNORMAL HIGH (ref 65–99)
Potassium: 4.6 mmol/L (ref 3.5–5.2)
Sodium: 140 mmol/L (ref 134–144)
Total Protein: 7.6 g/dL (ref 6.0–8.5)

## 2016-11-03 LAB — CBC WITH DIFFERENTIAL/PLATELET
Basophils Absolute: 0 10*3/uL (ref 0.0–0.2)
Basos: 0 %
EOS (ABSOLUTE): 0.2 10*3/uL (ref 0.0–0.4)
Eos: 4 %
Hematocrit: 33.8 % — ABNORMAL LOW (ref 34.0–46.6)
Hemoglobin: 10.9 g/dL — ABNORMAL LOW (ref 11.1–15.9)
Immature Grans (Abs): 0 10*3/uL (ref 0.0–0.1)
Immature Granulocytes: 0 %
Lymphocytes Absolute: 1.3 10*3/uL (ref 0.7–3.1)
Lymphs: 29 %
MCH: 32.6 pg (ref 26.6–33.0)
MCHC: 32.2 g/dL (ref 31.5–35.7)
MCV: 101 fL — ABNORMAL HIGH (ref 79–97)
Monocytes Absolute: 0.4 10*3/uL (ref 0.1–0.9)
Monocytes: 9 %
Neutrophils Absolute: 2.6 10*3/uL (ref 1.4–7.0)
Neutrophils: 58 %
Platelets: 250 10*3/uL (ref 150–379)
RBC: 3.34 x10E6/uL — ABNORMAL LOW (ref 3.77–5.28)
RDW: 14.3 % (ref 12.3–15.4)
WBC: 4.4 10*3/uL (ref 3.4–10.8)

## 2016-11-03 LAB — GLUCOSE, CAPILLARY: Glucose-Capillary: 106 mg/dL — ABNORMAL HIGH (ref 65–99)

## 2016-11-03 LAB — BASIC METABOLIC PANEL
Anion gap: 10 (ref 5–15)
BUN: 34 mg/dL — ABNORMAL HIGH (ref 6–20)
CO2: 21 mmol/L — ABNORMAL LOW (ref 22–32)
Calcium: 9.8 mg/dL (ref 8.9–10.3)
Chloride: 105 mmol/L (ref 101–111)
Creatinine, Ser: 1.56 mg/dL — ABNORMAL HIGH (ref 0.44–1.00)
GFR calc Af Amer: 36 mL/min — ABNORMAL LOW (ref >60)
GFR calc non Af Amer: 31 mL/min — ABNORMAL LOW (ref >60)
Glucose, Bld: 96 mg/dL (ref 65–99)
Potassium: 4 mmol/L (ref 3.5–5.1)
Sodium: 136 mmol/L (ref 135–145)

## 2016-11-03 LAB — LIPID PANEL
Chol/HDL Ratio: 3.6 ratio (ref 0.0–4.4)
Cholesterol, Total: 160 mg/dL (ref 100–199)
HDL: 44 mg/dL (ref >39)
LDL Calculated: 64 mg/dL (ref 0–99)
Triglycerides: 262 mg/dL — ABNORMAL HIGH (ref 0–149)
VLDL Cholesterol Cal: 52 mg/dL — ABNORMAL HIGH (ref 5–40)

## 2016-11-03 LAB — TSH: TSH: 0.451 u[IU]/mL (ref 0.450–4.500)

## 2016-11-03 SURGERY — ARTHROPLASTY, SHOULDER, TOTAL
Anesthesia: Regional | Site: Shoulder | Laterality: Right

## 2016-11-03 MED ORDER — KETOROLAC TROMETHAMINE 15 MG/ML IJ SOLN
7.5000 mg | Freq: Four times a day (QID) | INTRAMUSCULAR | Status: AC
  Administered 2016-11-03 – 2016-11-04 (×4): 7.5 mg via INTRAVENOUS
  Filled 2016-11-03 (×5): qty 1

## 2016-11-03 MED ORDER — DEXAMETHASONE SODIUM PHOSPHATE 10 MG/ML IJ SOLN
INTRAMUSCULAR | Status: DC | PRN
  Administered 2016-11-03: 10 mg via INTRAVENOUS

## 2016-11-03 MED ORDER — BENAZEPRIL HCL 20 MG PO TABS
20.0000 mg | ORAL_TABLET | Freq: Every day | ORAL | Status: DC
  Administered 2016-11-03 – 2016-11-06 (×4): 20 mg via ORAL
  Filled 2016-11-03 (×4): qty 1

## 2016-11-03 MED ORDER — ONDANSETRON HCL 4 MG/2ML IJ SOLN
4.0000 mg | Freq: Four times a day (QID) | INTRAMUSCULAR | Status: DC | PRN
  Administered 2016-11-04: 4 mg via INTRAVENOUS
  Filled 2016-11-03: qty 2

## 2016-11-03 MED ORDER — ONDANSETRON HCL 4 MG/2ML IJ SOLN
INTRAMUSCULAR | Status: DC | PRN
  Administered 2016-11-03: 4 mg via INTRAVENOUS

## 2016-11-03 MED ORDER — ROCURONIUM BROMIDE 10 MG/ML (PF) SYRINGE
PREFILLED_SYRINGE | INTRAVENOUS | Status: DC | PRN
  Administered 2016-11-03: 50 mg via INTRAVENOUS

## 2016-11-03 MED ORDER — FENTANYL CITRATE (PF) 100 MCG/2ML IJ SOLN
INTRAMUSCULAR | Status: AC
  Administered 2016-11-03: 100 ug via INTRAVENOUS
  Filled 2016-11-03: qty 2

## 2016-11-03 MED ORDER — TRANEXAMIC ACID 1000 MG/10ML IV SOLN
1000.0000 mg | Freq: Once | INTRAVENOUS | Status: AC
  Administered 2016-11-03: 1000 mg via INTRAVENOUS
  Filled 2016-11-03: qty 10

## 2016-11-03 MED ORDER — FLEET ENEMA 7-19 GM/118ML RE ENEM: 1.0000 | ENEMA | Freq: Once | RECTAL | Status: DC | PRN

## 2016-11-03 MED ORDER — LACTATED RINGERS IV SOLN
INTRAVENOUS | Status: DC
  Administered 2016-11-03 (×2): via INTRAVENOUS

## 2016-11-03 MED ORDER — FENTANYL CITRATE (PF) 100 MCG/2ML IJ SOLN: 25.0000 ug | INTRAMUSCULAR | Status: DC | PRN

## 2016-11-03 MED ORDER — ACETAMINOPHEN 325 MG PO TABS
650.0000 mg | ORAL_TABLET | Freq: Four times a day (QID) | ORAL | Status: DC | PRN
  Administered 2016-11-06: 650 mg via ORAL
  Filled 2016-11-03: qty 2

## 2016-11-03 MED ORDER — FENTANYL CITRATE (PF) 100 MCG/2ML IJ SOLN
100.0000 ug | Freq: Once | INTRAMUSCULAR | Status: AC
  Administered 2016-11-03: 100 ug via INTRAVENOUS

## 2016-11-03 MED ORDER — DOCUSATE SODIUM 100 MG PO CAPS
100.0000 mg | ORAL_CAPSULE | Freq: Two times a day (BID) | ORAL | Status: DC
  Administered 2016-11-06: 100 mg via ORAL
  Filled 2016-11-03 (×4): qty 1

## 2016-11-03 MED ORDER — LEVOTHYROXINE SODIUM 112 MCG PO TABS
112.0000 ug | ORAL_TABLET | Freq: Every day | ORAL | Status: DC
  Administered 2016-11-04 – 2016-11-06 (×3): 112 ug via ORAL
  Filled 2016-11-03 (×3): qty 1

## 2016-11-03 MED ORDER — LACTATED RINGERS IV SOLN
INTRAVENOUS | Status: DC
  Administered 2016-11-03: 09:00:00 via INTRAVENOUS

## 2016-11-03 MED ORDER — CEFAZOLIN SODIUM-DEXTROSE 2-3 GM-% IV SOLR: INTRAVENOUS | Status: DC | PRN

## 2016-11-03 MED ORDER — ASPIRIN EC 81 MG PO TBEC
81.0000 mg | DELAYED_RELEASE_TABLET | Freq: Every day | ORAL | Status: DC
  Administered 2016-11-03 – 2016-11-06 (×4): 81 mg via ORAL
  Filled 2016-11-03 (×4): qty 1

## 2016-11-03 MED ORDER — PHENOL 1.4 % MT LIQD: 1.0000 | OROMUCOSAL | Status: DC | PRN

## 2016-11-03 MED ORDER — PHENYLEPHRINE 40 MCG/ML (10ML) SYRINGE FOR IV PUSH (FOR BLOOD PRESSURE SUPPORT)
PREFILLED_SYRINGE | INTRAVENOUS | Status: AC
  Filled 2016-11-03: qty 10

## 2016-11-03 MED ORDER — METOCLOPRAMIDE HCL 5 MG PO TABS: 5.0000 mg | ORAL_TABLET | Freq: Three times a day (TID) | ORAL | Status: DC | PRN

## 2016-11-03 MED ORDER — ROCURONIUM BROMIDE 50 MG/5ML IV SOLN
INTRAVENOUS | Status: AC
  Filled 2016-11-03: qty 1

## 2016-11-03 MED ORDER — FENTANYL CITRATE (PF) 250 MCG/5ML IJ SOLN
INTRAMUSCULAR | Status: AC
  Filled 2016-11-03: qty 5

## 2016-11-03 MED ORDER — DIPHENHYDRAMINE HCL 12.5 MG/5ML PO ELIX: 12.5000 mg | ORAL_SOLUTION | ORAL | Status: DC | PRN

## 2016-11-03 MED ORDER — PROPOFOL 10 MG/ML IV BOLUS
INTRAVENOUS | Status: DC | PRN
  Administered 2016-11-03: 30 mg via INTRAVENOUS
  Administered 2016-11-03: 120 mg via INTRAVENOUS

## 2016-11-03 MED ORDER — BISACODYL 5 MG PO TBEC: 5.0000 mg | DELAYED_RELEASE_TABLET | Freq: Every day | ORAL | Status: DC | PRN

## 2016-11-03 MED ORDER — EPHEDRINE 5 MG/ML INJ
INTRAVENOUS | Status: AC
  Filled 2016-11-03: qty 10

## 2016-11-03 MED ORDER — MIDAZOLAM HCL 2 MG/2ML IJ SOLN
2.0000 mg | Freq: Once | INTRAMUSCULAR | Status: AC
  Administered 2016-11-03: 2 mg via INTRAVENOUS

## 2016-11-03 MED ORDER — CEFAZOLIN SODIUM-DEXTROSE 2-4 GM/100ML-% IV SOLN
2.0000 g | Freq: Four times a day (QID) | INTRAVENOUS | Status: AC
  Administered 2016-11-03 – 2016-11-04 (×3): 2 g via INTRAVENOUS
  Filled 2016-11-03 (×3): qty 100

## 2016-11-03 MED ORDER — MENTHOL 3 MG MT LOZG: 1.0000 | LOZENGE | OROMUCOSAL | Status: DC | PRN

## 2016-11-03 MED ORDER — HYDROMORPHONE HCL 1 MG/ML IJ SOLN
1.0000 mg | INTRAMUSCULAR | Status: DC | PRN
  Administered 2016-11-03 – 2016-11-04 (×4): 1 mg via INTRAVENOUS
  Filled 2016-11-03 (×4): qty 1

## 2016-11-03 MED ORDER — SUGAMMADEX SODIUM 200 MG/2ML IV SOLN
INTRAVENOUS | Status: AC
  Filled 2016-11-03: qty 2

## 2016-11-03 MED ORDER — LIDOCAINE 2% (20 MG/ML) 5 ML SYRINGE
INTRAMUSCULAR | Status: DC | PRN
  Administered 2016-11-03: 40 mg via INTRAVENOUS

## 2016-11-03 MED ORDER — DEXAMETHASONE SODIUM PHOSPHATE 10 MG/ML IJ SOLN
INTRAMUSCULAR | Status: AC
  Filled 2016-11-03: qty 1

## 2016-11-03 MED ORDER — DIAZEPAM 2 MG PO TABS
2.0000 mg | ORAL_TABLET | Freq: Four times a day (QID) | ORAL | Status: DC | PRN
  Administered 2016-11-05 – 2016-11-06 (×4): 2 mg via ORAL
  Filled 2016-11-03 (×6): qty 1

## 2016-11-03 MED ORDER — 0.9 % SODIUM CHLORIDE (POUR BTL) OPTIME
TOPICAL | Status: DC | PRN
  Administered 2016-11-03: 1000 mL

## 2016-11-03 MED ORDER — ATORVASTATIN CALCIUM 20 MG PO TABS
20.0000 mg | ORAL_TABLET | Freq: Every day | ORAL | Status: DC
  Administered 2016-11-03 – 2016-11-05 (×3): 20 mg via ORAL
  Filled 2016-11-03 (×3): qty 1

## 2016-11-03 MED ORDER — CHLORHEXIDINE GLUCONATE 4 % EX LIQD: 60.0000 mL | Freq: Once | CUTANEOUS | Status: DC

## 2016-11-03 MED ORDER — PHENYLEPHRINE HCL 10 MG/ML IJ SOLN
INTRAVENOUS | Status: DC | PRN
  Administered 2016-11-03: 50 ug/min via INTRAVENOUS

## 2016-11-03 MED ORDER — ONDANSETRON HCL 4 MG/2ML IJ SOLN
INTRAMUSCULAR | Status: AC
  Filled 2016-11-03: qty 2

## 2016-11-03 MED ORDER — ALUM & MAG HYDROXIDE-SIMETH 200-200-20 MG/5ML PO SUSP
30.0000 mL | ORAL | Status: DC | PRN
  Administered 2016-11-04: 30 mL via ORAL
  Filled 2016-11-03: qty 30

## 2016-11-03 MED ORDER — FENTANYL CITRATE (PF) 250 MCG/5ML IJ SOLN
INTRAMUSCULAR | Status: DC | PRN
  Administered 2016-11-03: 50 ug via INTRAVENOUS

## 2016-11-03 MED ORDER — ONDANSETRON HCL 4 MG PO TABS: 4.0000 mg | ORAL_TABLET | Freq: Four times a day (QID) | ORAL | Status: DC | PRN

## 2016-11-03 MED ORDER — MEPERIDINE HCL 25 MG/ML IJ SOLN: 6.2500 mg | INTRAMUSCULAR | Status: DC | PRN

## 2016-11-03 MED ORDER — PHENYLEPHRINE 40 MCG/ML (10ML) SYRINGE FOR IV PUSH (FOR BLOOD PRESSURE SUPPORT)
PREFILLED_SYRINGE | INTRAVENOUS | Status: DC | PRN
  Administered 2016-11-03 (×2): 80 ug via INTRAVENOUS
  Administered 2016-11-03: 120 ug via INTRAVENOUS

## 2016-11-03 MED ORDER — ROPIVACAINE HCL 5 MG/ML IJ SOLN
INTRAMUSCULAR | Status: DC | PRN
  Administered 2016-11-03: 25 mL via PERINEURAL

## 2016-11-03 MED ORDER — LIDOCAINE HCL (CARDIAC) 20 MG/ML IV SOLN
INTRAVENOUS | Status: AC
  Filled 2016-11-03: qty 5

## 2016-11-03 MED ORDER — METOCLOPRAMIDE HCL 5 MG/ML IJ SOLN: 5.0000 mg | Freq: Three times a day (TID) | INTRAMUSCULAR | Status: DC | PRN

## 2016-11-03 MED ORDER — SUGAMMADEX SODIUM 200 MG/2ML IV SOLN
INTRAVENOUS | Status: DC | PRN
  Administered 2016-11-03: 200 mg via INTRAVENOUS

## 2016-11-03 MED ORDER — PROPOFOL 10 MG/ML IV BOLUS
INTRAVENOUS | Status: AC
  Filled 2016-11-03: qty 20

## 2016-11-03 MED ORDER — ACETAMINOPHEN 650 MG RE SUPP: 650.0000 mg | Freq: Four times a day (QID) | RECTAL | Status: DC | PRN

## 2016-11-03 MED ORDER — OXYCODONE HCL 5 MG PO TABS
5.0000 mg | ORAL_TABLET | ORAL | Status: DC | PRN
  Administered 2016-11-03 – 2016-11-04 (×2): 10 mg via ORAL
  Administered 2016-11-04: 5 mg via ORAL
  Administered 2016-11-04 – 2016-11-06 (×7): 10 mg via ORAL
  Filled 2016-11-03 (×10): qty 2

## 2016-11-03 MED ORDER — POLYETHYLENE GLYCOL 3350 17 G PO PACK: 17.0000 g | PACK | Freq: Every day | ORAL | Status: DC | PRN

## 2016-11-03 MED ORDER — MIDAZOLAM HCL 2 MG/2ML IJ SOLN
INTRAMUSCULAR | Status: AC
  Administered 2016-11-03: 2 mg via INTRAVENOUS
  Filled 2016-11-03: qty 2

## 2016-11-03 MED ORDER — KETOROLAC TROMETHAMINE 15 MG/ML IJ SOLN
INTRAMUSCULAR | Status: AC
  Filled 2016-11-03: qty 1

## 2016-11-03 SURGICAL SUPPLY — 58 items
BLADE SAW SGTL 83.5X18.5 (BLADE) ×3 IMPLANT
CEMENT BONE DEPUY (Cement) ×3 IMPLANT
COVER SURGICAL LIGHT HANDLE (MISCELLANEOUS) ×3 IMPLANT
DERMABOND ADHESIVE PROPEN (GAUZE/BANDAGES/DRESSINGS) ×2
DERMABOND ADVANCED .7 DNX6 (GAUZE/BANDAGES/DRESSINGS) ×1 IMPLANT
DRAPE ORTHO SPLIT 77X108 STRL (DRAPES) ×4
DRAPE SURG 17X11 SM STRL (DRAPES) ×3 IMPLANT
DRAPE SURG ORHT 6 SPLT 77X108 (DRAPES) ×2 IMPLANT
DRAPE U-SHAPE 47X51 STRL (DRAPES) ×3 IMPLANT
DRSG AQUACEL AG ADV 3.5X10 (GAUZE/BANDAGES/DRESSINGS) ×3 IMPLANT
DURAPREP 26ML APPLICATOR (WOUND CARE) ×3 IMPLANT
ELECT BLADE 4.0 EZ CLEAN MEGAD (MISCELLANEOUS) ×3
ELECT CAUTERY BLADE 6.4 (BLADE) ×3 IMPLANT
ELECT REM PT RETURN 9FT ADLT (ELECTROSURGICAL) ×3
ELECTRODE BLDE 4.0 EZ CLN MEGD (MISCELLANEOUS) ×1 IMPLANT
ELECTRODE REM PT RTRN 9FT ADLT (ELECTROSURGICAL) ×1 IMPLANT
FACESHIELD WRAPAROUND (MASK) ×9 IMPLANT
GLENOID WITH CLEAT MEDIUM (Shoulder) ×3 IMPLANT
GLOVE BIO SURGEON STRL SZ7.5 (GLOVE) ×3 IMPLANT
GLOVE BIO SURGEON STRL SZ8 (GLOVE) ×3 IMPLANT
GLOVE EUDERMIC 7 POWDERFREE (GLOVE) ×3 IMPLANT
GLOVE SS BIOGEL STRL SZ 7.5 (GLOVE) ×1 IMPLANT
GLOVE SUPERSENSE BIOGEL SZ 7.5 (GLOVE) ×2
GOWN STRL REUS W/ TWL LRG LVL3 (GOWN DISPOSABLE) ×1 IMPLANT
GOWN STRL REUS W/ TWL XL LVL3 (GOWN DISPOSABLE) ×2 IMPLANT
GOWN STRL REUS W/TWL LRG LVL3 (GOWN DISPOSABLE) ×2
GOWN STRL REUS W/TWL XL LVL3 (GOWN DISPOSABLE) ×4
HEAD HUMERAL UNIVERS II 48/19 (Head) ×3 IMPLANT
KIT BASIN OR (CUSTOM PROCEDURE TRAY) ×3 IMPLANT
KIT ROOM TURNOVER OR (KITS) ×3 IMPLANT
KIT SET UNIVERSAL (KITS) ×3 IMPLANT
MANIFOLD NEPTUNE II (INSTRUMENTS) ×3 IMPLANT
NDL SUT .5 MAYO 1.404X.05X (NEEDLE) ×1 IMPLANT
NDL SUT 6 .5 CRC .975X.05 MAYO (NEEDLE) ×1 IMPLANT
NEEDLE MAYO TAPER (NEEDLE) ×4
NS IRRIG 1000ML POUR BTL (IV SOLUTION) ×3 IMPLANT
PACK SHOULDER (CUSTOM PROCEDURE TRAY) ×3 IMPLANT
PAD ARMBOARD 7.5X6 YLW CONV (MISCELLANEOUS) ×6 IMPLANT
RESTRAINT HEAD UNIVERSAL NS (MISCELLANEOUS) ×3 IMPLANT
SLING ARM FOAM STRAP LRG (SOFTGOODS) ×3 IMPLANT
SLING ARM XL FOAM STRAP (SOFTGOODS) ×3 IMPLANT
SMARTMIX MINI TOWER (MISCELLANEOUS) ×3
SPONGE LAP 18X18 X RAY DECT (DISPOSABLE) ×3 IMPLANT
SPONGE LAP 4X18 X RAY DECT (DISPOSABLE) ×3 IMPLANT
STEM HUMERAL APEX UNI 9MM (Stem) ×3 IMPLANT
SUCTION FRAZIER HANDLE 10FR (MISCELLANEOUS) ×2
SUCTION TUBE FRAZIER 10FR DISP (MISCELLANEOUS) ×1 IMPLANT
SUT FIBERWIRE #2 38 T-5 BLUE (SUTURE) ×12
SUT MNCRL AB 3-0 PS2 18 (SUTURE) ×3 IMPLANT
SUT MON AB 2-0 CT1 36 (SUTURE) ×3 IMPLANT
SUT VIC AB 1 CT1 27 (SUTURE) ×2
SUT VIC AB 1 CT1 27XBRD ANBCTR (SUTURE) ×1 IMPLANT
SUTURE FIBERWR #2 38 T-5 BLUE (SUTURE) ×4 IMPLANT
SYR CONTROL 10ML LL (SYRINGE) IMPLANT
TOWEL OR 17X24 6PK STRL BLUE (TOWEL DISPOSABLE) ×3 IMPLANT
TOWEL OR 17X26 10 PK STRL BLUE (TOWEL DISPOSABLE) ×3 IMPLANT
TOWER SMARTMIX MINI (MISCELLANEOUS) ×1 IMPLANT
WATER STERILE IRR 1000ML POUR (IV SOLUTION) ×3 IMPLANT

## 2016-11-03 NOTE — H&P (Signed)
Bailey Ayala    Chief Complaint: Right shoulder osteoarthritis  HPI: The patient is a 76 y.o. female with end stage right shoulder OA  Past Medical History:  Diagnosis Date  . Arthritis   . Blood dyscrasia    BLEEDS EASILY   . COPD (chronic obstructive pulmonary disease) ([place])   . Diabetes mellitus without complication ([place])   . Diffuse cystic mastopathy   . Hyperlipidemia   . Hypertension   . Hypothyroidism   . L4-L5 disc bulge   . PAD (peripheral artery disease) ([place])   . Shortness of breath dyspnea    WITH EXERTION   . Stroke ([place])    TIA     2016     WAS FOUND ON SCAN ORDERED FROM [facility]  . Thyroid disease   . TIA (transient ischemic attack) 2016   left hand weakness    Past Surgical History:  Procedure Laterality Date  . ABDOMINAL HYSTERECTOMY    . BACK SURGERY  2011  . COLONOSCOPY  1998  . EYE SURGERY Bilateral 05/06/14  . JOINT REPLACEMENT     LT KNEE  . KNEE SURGERY Left 2012  . parathyroid transplant     2/3 THYROID REMOVED   ([place])  . SPINE SURGERY    . TONSILLECTOMY    . TOTAL SHOULDER ARTHROPLASTY Left 01/14/2016   Procedure: LEFT TOTAL SHOULDER ARTHROPLASTY;  Surgeon: Justice Britain, MD;  Location: [facility];  Service: Orthopedics;  Laterality: Left;  . ULNAR NERVE REPAIR     LEFT  . vein closure procedure Left 2010    Family History  Problem Relation Age of Onset  . Heart disease Mother        MI  . Arthritis Mother   . Stroke Mother   . Heart disease Father        MI    Social History:  reports that she quit smoking about 27 years ago. She has never used smokeless tobacco. She reports that she drinks about 1.2 oz of alcohol per week . She reports that she does not use drugs.   Medications Prior to Admission  Medication Sig Dispense Refill  . aspirin EC 81 MG tablet Take 1 tablet (81 mg total) by mouth daily. 90 tablet 3  . atorvastatin (LIPITOR) 20 MG tablet TAKE 1 TABLET(20 MG) BY MOUTH DAILY 30 tablet 3  . benazepril (LOTENSIN) 20 MG  tablet Take 1 tablet (20 mg total) by mouth daily. 30 tablet 12  . ibuprofen (ADVIL,MOTRIN) 200 MG tablet Take 400 mg by mouth every 8 (eight) hours as needed for mild pain.    Marland Kitchen levothyroxine (SYNTHROID, LEVOTHROID) 112 MCG tablet Take 1 tablet (112 mcg total) by mouth daily. 30 tablet 12  . Multiple Vitamins-Minerals (CENTRUM SILVER ULTRA WOMENS PO) Take 1 tablet by mouth daily.     . traMADol (ULTRAM) 50 MG tablet Take 50 mg by mouth at bedtime as needed for moderate pain.       Physical Exam: right shoulder with painful and restricted motion as noted at recent office visits  Vitals  Temp:  [98.4 F (36.9 C)] 98.4 F (36.9 C) (07/12 0827) Pulse Rate:  [89-90] 89 (07/12 0827) Resp:  [20] 20 (07/12 0827) BP: (134-162)/(62-72) 162/72 (07/12 0827) SpO2:  [95 %-100 %] 95 % (07/12 0827) Weight:  [84.4 kg (186 lb)] 84.4 kg (186 lb) (07/11 1405)  Assessment/Plan  Impression: Right shoulder osteoarthritis   Plan of Action: Procedure(s): RIGHT TOTAL SHOULDER ARTHROPLASTY  Bailey Ayala  Bailey Ayala 11/03/2016, 9:02 AM Contact # [phone]

## 2016-11-03 NOTE — Anesthesia Procedure Notes (Signed)
Procedure Name: Intubation Date/Time: 11/03/2016 9:31 AM Performed by: Mervyn Gay Pre-anesthesia Checklist: Patient identified, Patient being monitored, Timeout performed, Emergency Drugs available and Suction available Patient Re-evaluated:Patient Re-evaluated prior to induction Oxygen Delivery Method: Circle System Utilized Preoxygenation: Pre-oxygenation with 100% oxygen Induction Type: IV induction Ventilation: Mask ventilation without difficulty Laryngoscope Size: 3 and Mac Grade View: Grade I Tube type: Oral Tube size: 7.0 mm Number of attempts: 1 Airway Equipment and Method: Stylet Placement Confirmation: ETT inserted through vocal cords under direct vision,  positive ETCO2 and breath sounds checked- equal and bilateral Secured at: 21 cm Tube secured with: Tape Dental Injury: Teeth and Oropharynx as per pre-operative assessment

## 2016-11-03 NOTE — Op Note (Signed)
11/03/2016  11:14 AM  PATIENT:   Bailey Ayala  76 y.o. female  PRE-OPERATIVE DIAGNOSIS:  Right shoulder osteoarthritis   POST-OPERATIVE DIAGNOSIS:  same  PROCEDURE:  R TSA #9 stem, 48x19 head, medium glenoid  SURGEON:  Lilyrose Tanney, Metta Clines M.D.  ASSISTANTS: Shuford pac   ANESTHESIA:   GET + ISB  EBL: 150  SPECIMEN:  none  Drains: none   PATIENT DISPOSITION:  PACU - hemodynamically stable.    PLAN OF CARE: Admit to inpatient   Dictation# [id number]   Contact # [phone]

## 2016-11-03 NOTE — Anesthesia Postprocedure Evaluation (Signed)
Anesthesia Post Note  Patient: [name]  Procedure(s) Performed: Procedure(s) (LRB): RIGHT TOTAL SHOULDER ARTHROPLASTY (Right)     Patient location during evaluation: PACU Anesthesia Type: Regional Level of consciousness: awake and alert Pain management: pain level controlled Vital Signs Assessment: post-procedure vital signs reviewed and stable Respiratory status: spontaneous breathing, nonlabored ventilation, respiratory function stable and patient connected to nasal cannula oxygen Cardiovascular status: blood pressure returned to baseline and stable Postop Assessment: no signs of nausea or vomiting Anesthetic complications: no    Last Vitals:  Vitals:   11/03/16 1150 11/03/16 1205  BP: 133/64 (!) 133/59  Pulse: 86 82  Resp: 13 13  Temp:      Last Pain:  Vitals:   11/03/16 1210  TempSrc:   PainSc: 0-No pain                 Wynelle Dreier

## 2016-11-03 NOTE — Anesthesia Procedure Notes (Signed)
Anesthesia Regional Block: Interscalene brachial plexus block   Pre-Anesthetic Checklist: ,, timeout performed, Correct Patient, Correct Site, Correct Laterality, Correct Procedure, Correct Position, site marked, Risks and benefits discussed,  Surgical consent,  Pre-op evaluation,  At surgeon's request and post-op pain management  Laterality: Right  Prep: chloraprep       Needles:  Injection technique: Single-shot  Needle Type: Echogenic Needle     Needle Length: 5cm  Needle Gauge: 21     Additional Needles:   Procedures: ultrasound guided,,,,,,,,  Narrative:  Start time: 11/03/2016 8:50 AM End time: 11/03/2016 8:55 AM Injection made incrementally with aspirations every 5 mL.  Performed by: Personally  Anesthesiologist: Javani Spratt

## 2016-11-03 NOTE — Anesthesia Preprocedure Evaluation (Signed)
Anesthesia Evaluation  Patient identified by MRN, date of birth, ID band Patient awake    Reviewed: Allergy & Precautions, H&P , NPO status , Patient's Chart, lab work & pertinent test results  History of Anesthesia Complications Negative for: history of anesthetic complications  Airway Mallampati: II  TM Distance: >3 FB Neck ROM: Full    Dental  (+) Upper Dentures, Partial Upper   Pulmonary neg shortness of breath, neg sleep apnea, COPD, neg recent URI, former smoker,    Pulmonary exam normal breath sounds clear to auscultation       Cardiovascular hypertension, Pt. on medications (-) angina+ Peripheral Vascular Disease and + DOE  (-) Past MI, (-) Cardiac Stents, (-) CABG and (-) Orthopnea  Rhythm:Regular Rate:Normal  HLD   Neuro/Psych neg Seizures Bulging disc at L4-5 TIA (2016)   GI/Hepatic negative GI ROS, Neg liver ROS,   Endo/Other  diabetesHypothyroidism   Renal/GU CRFRenal disease     Musculoskeletal  (+) Arthritis ,   Abdominal (+) + obese,   Peds  Hematology  (+) Blood dyscrasia (bleeds easily), ,   Anesthesia Other Findings   Reproductive/Obstetrics                             Anesthesia Physical  Anesthesia Plan  ASA: III  Anesthesia Plan: General and Regional   Post-op Pain Management: GA combined w/ Regional for post-op pain   Induction: Intravenous  PONV Risk Score and Plan: 2 and Ondansetron and Dexamethasone  Airway Management Planned: Oral ETT  Additional Equipment:   Intra-op Plan:   Post-operative Plan: Extubation in OR  Informed Consent: I have reviewed the patients History and Physical, chart, labs and discussed the procedure including the risks, benefits and alternatives for the proposed anesthesia with the patient or authorized representative who has indicated his/her understanding and acceptance.   Dental advisory given  Plan Discussed with:  CRNA, Anesthesiologist and Surgeon  Anesthesia Plan Comments: (Risks of general anesthesia discussed including, but not limited to, sore throat, hoarse voice, chipped/damaged teeth, injury to vocal cords, nausea and vomiting, allergic reactions, lung infection, heart attack, stroke, and death. All questions answered.  Discussed potential risks of nerve blocks including, but not limited to, infection, bleeding, nerve damage, seizures, pneumothorax, respiratory depression, and potential failure of the block. Alternatives to nerve blocks discussed. All questions answered. )        Anesthesia Quick Evaluation

## 2016-11-03 NOTE — Transfer of Care (Signed)
Immediate Anesthesia Transfer of Care Note  Patient: [name]  Procedure(s) Performed: Procedure(s): RIGHT TOTAL SHOULDER ARTHROPLASTY (Right)  Patient Location: PACU  Anesthesia Type:GA combined with regional for post-op pain  Level of Consciousness: awake, alert , oriented and patient cooperative  Airway & Oxygen Therapy: Patient Spontanous Breathing and Patient connected to nasal cannula oxygen  Post-op Assessment: Report given to RN and Post -op Vital signs reviewed and stable  Post vital signs: Reviewed and stable  Last Vitals:  Vitals:   11/03/16 0827  BP: (!) 162/72  Pulse: 89  Resp: 20  Temp: 36.9 C    Last Pain:  Vitals:   11/03/16 0837  TempSrc:   PainSc: 3       Patients Stated Pain Goal: 3 (94/70/76 1518)  Complications: No apparent anesthesia complications

## 2016-11-03 NOTE — Op Note (Signed)
NAME:  Bailey Ayala, Bailey Ayala                  ACCOUNT NO.:  MEDICAL RECORD NO.:  [id number]  LOCATION:                                 FACILITY:  PHYSICIAN:  Metta Clines. Laveyah Oriol, M.D.       DATE OF BIRTH:  DATE OF PROCEDURE:  11/03/2016 DATE OF DISCHARGE:                              OPERATIVE REPORT   PREOPERATIVE DIAGNOSIS:  End-stage right shoulder osteoarthritis.  POSTOPERATIVE DIAGNOSIS:  End-stage right shoulder osteoarthritis.  PROCEDURE:  A right total shoulder arthroplasty utilizing a press-fit size 9 Arthrex stem with 48 x 19 head and a medium glenoid.  SURGEON:  Metta Clines. Sigrid Schwebach, M.D.  ASSISTANT:  Reather Laurence Shuford, PA-C.  ANESTHESIA:  General endotracheal as well as an interscalene block.  ESTIMATED BLOOD LOSS:  150 mL.  DRAINS:  None.  HISTORY:  Bailey Ayala is a 76 year old female, well known to our practice after previous left total shoulder arthroplasty for end-stage arthritis, now brought to the operating room for planned right total shoulder arthroplasty.  Clinically, she has had significantly increased pain and increasing functional limitations, with radiographs showing complete loss of joint space, subchondral sclerosis, asymmetry of the humeral head, and osteophytic spurring.  Brought to the operating room at this time, for planned right total shoulder arthroplasty.  Preoperatively, I counseled Bailey Ayala regarding treatment options and potential risks versus benefits thereof.  Possible surgical complications were reviewed including bleeding, infection, neurovascular injury, persistent pain, loss of motion, anesthetic complication, failure of the implant, and possible need for additional surgery.  She understands and accepts and agrees with our planned procedure.  PROCEDURE IN DETAIL:  After undergoing routine preop evaluation, the patient received prophylactic antibiotics and an interscalene block was established in the holding area by the Anesthesia  Department.  Placed supine on the operating table, underwent smooth induction of a general endotracheal anesthesia.  Placed in beach-chair position and appropriately padded and protected.  The right shoulder girdle region was sterilely prepped and draped in standard fashion.  A time-out was called.  Anterior deltopectoral approach to the right shoulder was made through a 10 cm incision.  Skin flaps were elevated.  Dissection carried deeply.  The deltopectoral interval was developed from proximal and distal vein taken laterally.  Divided adhesions beneath the deltoid, and the conjoint tendon was mobilized and retracted medially and divided the upper centimeter of the pec major to enhance exposure.  There was marked edema and significant swelling along the course of the biceps tendon. The biceps was unroofed and extensive tenosynovectomy was performed. The biceps was tenotomized for later tenodesis.  We then divided the rotator cuff along the rotator interval to the base of the coracoid and then separated the subscap away from the lesser tuberosity leaving a 1 cm cuff of tissue for later repair and tagged the free margin with a series of 4 grasping #2 FiberWire sutures.  We then divided the capsular attachments from the anteroinferior and inferior aspects of the humeral neck allowing delivery of the head through the wound.  We used our extramedullary guide to outline our proposed humeral head resection, which we then performed with an oscillating saw taking care to  protect the rotator cuff superiorly and posteriorly.  I then removed the residual osteophytes on the anterior and inferior margins of the humeral neck.  At this point, we hand reamed the canal up to size 7, broached up to size 9, performed some additional modifications of her humeral head resection, make sure we were happy with the overall degree of alignment and did recreate the native retroversion approximately 30 degrees.   Once the canal was broached, we then placed a size 8 stem with a metal cap over the cut proximal humeral surface and then exposed the glenoid with combination of Fukuda, pitchfork, and snake tongue retractors.  I performed a circumferential labral resection gaining complete visualization of the periphery of the glenoid and glenoid was severely degenerated.  We placed a guidepin into the center of the glenoid after we determined the medium would be appropriate size.  We then reamed the glenoid to a stable subchondral bony bed, placed a central drill hole. Following this, we used the guide to create our superior and inferior peg and slots respectively.  The glenoid was then broached and a trial glenoid showed excellent fit.  The glenoid was then copiously irrigated. Hemostasis was obtained.  We mixed cement, introduced this into the superior and inferior peg and slot respectively and then impacted our glenoid with excellent interference fit and fixation.  At this point, we turned our attention back to the proximal humerus where we cleared the canal, we impacted our implant to the appropriate level and then tightened the proximal locking screws with excellent fit and fixation. I then performed a series of trial reductions and ultimately felt that the 48 x 19 eccentric head gave [place] the best soft tissue balance and 50% translation of the humeral head and glenoid.  Very pleased with the overall soft tissue balance.  Trial was removed.  The Bailey Ayala taper was cleaned.  The final 48 x 19 head was then impacted and final reduction was performed.  We were very pleased with overall soft tissue alignment and balance and stability.  At this point, the subscapularis was then mobilized and then repaired back to the lesser tuberosity using the series of 4 grasping FiberWire's allowing excellent reapposition and easily allowed 45 degrees of external rotation with the arm at the side. The rotator interval was  also repaired with a pair of figure-of-eight #2 FiberWire sutures.  The biceps tendon was then tenodesed at the upper level of the pec major with the #2 FiberWire as well.  The wound again was irrigated.  Hemostasis was obtained.  The deltopectoral interval was then reapproximated with a series of figure-of-eight #1 Vicryl sutures. 2-0 Vicryl was used for subcu layer, intracuticular 3-0 Monocryl for the skin, followed by Dermabond and Aquacel dressing.  Right arm was placed in a sling.  The patient was awakened, extubated, and taken to the recovery room in stable condition.  Jenetta Loges, PA-C, was used as an Environmental consultant throughout this case, was essential for help with positioning the patient, positioning the extremity, tissue manipulation, retraction, implantation of the prosthesis, wound closure, and intraoperative decision making.     Metta Clines. Krisanne Lich, M.D.     KMS/MEDQ  D:  11/03/2016  T:  11/03/2016  Job:  [id number]

## 2016-11-03 NOTE — Discharge Instructions (Signed)

## 2016-11-04 ENCOUNTER — Encounter (HOSPITAL_COMMUNITY): Payer: Self-pay | Admitting: Orthopedic Surgery

## 2016-11-04 ENCOUNTER — Encounter
Admission: RE | Admit: 2016-11-04 | Discharge: 2016-11-04 | Disposition: A | Discharge status: Home / Self Care | Payer: Medicare Other | Source: Ambulatory Visit | Attending: Internal Medicine | Admitting: Internal Medicine

## 2016-11-04 MED ORDER — TRAMADOL HCL 50 MG PO TABS: 50.0000 mg | ORAL_TABLET | Freq: Every evening | ORAL | 0 refills | Status: DC | PRN

## 2016-11-04 MED ORDER — TRAMADOL HCL 50 MG PO TABS
50.0000 mg | ORAL_TABLET | Freq: Four times a day (QID) | ORAL | Status: DC
  Administered 2016-11-04 – 2016-11-06 (×7): 50 mg via ORAL
  Filled 2016-11-04 (×7): qty 1

## 2016-11-04 NOTE — Progress Notes (Signed)
CSW informed pt will need SNF and preference is [facility]- pt will have 3 night qualifying stay on Sunday  [facility] is able to accept patient on Sunday if medically stable  CSW will continue to follow  Jorge Ny, [place] Social Worker [phone]

## 2016-11-04 NOTE — Evaluation (Signed)
Physical Therapy Evaluation Patient Details Name: Bailey Ayala MRN: [id number] DOB: Feb 05, 1941 Today's Date: 11/04/2016   History of Present Illness  76 yo s/p R TSA. PMH L TSA. DM. COPD. TIA. See chart for further PMH.   Clinical Impression  Patient presents with decreased independence with mobility due to deficits listed in PT problem list.  She will benefit from skilled PT in the acute setting to allow return home following SNF level rehab stay.     Follow Up Recommendations SNF    Equipment Recommendations  Other (comment) (TBA)    Recommendations for Other Services       Precautions / Restrictions Precautions Precautions: Shoulder Type of Shoulder Precautions: Passive - 30-60-90 (ER, Abd, FF). Pendulums OK Shoulder Interventions: Shoulder sling/immobilizer;Off for dressing/bathing/exercises Required Braces or Orthoses: Sling Restrictions RUE Weight Bearing: Non weight bearing      Mobility  Bed Mobility               General bed mobility comments: up in chair  Transfers Overall transfer level: Needs assistance Equipment used: 1 person hand held assist Transfers: Sit to/from Stand Sit to Stand: Min assist         General transfer comment: for balance  Ambulation/Gait Ambulation/Gait assistance: Min assist Ambulation Distance (Feet): 120 Feet Assistive device: 1 person hand held assist Gait Pattern/deviations: Step-through pattern;Decreased stride length;Trunk flexed     General Gait Details: cues for posture  Stairs            Wheelchair Mobility    Modified Rankin (Stroke Patients Only)       Balance Overall balance assessment: Needs assistance   Sitting balance-Leahy Scale: Good       Standing balance-Leahy Scale: Fair                               Pertinent Vitals/Pain Pain Assessment: 0-10 Pain Score: 6  Pain Location: R anterior shoulder Pain Descriptors / Indicators: Discomfort;Sore Pain  Intervention(s): Monitored during session;Repositioned;Ice applied    Home Living Family/patient expects to be discharged to:: Skilled nursing facility [facility])                 Additional Comments: lives alone in two level home, stays on main, has 10 steps to enter    Prior Function Level of Independence: Independent               Hand Dominance   Dominant Hand: Right    Extremity/Trunk Assessment   Upper Extremity Assessment Upper Extremity Assessment: Defer to OT evaluation    Lower Extremity Assessment Lower Extremity Assessment: Overall WFL for tasks assessed       Communication   Communication: Other (comment);No difficulties (has hearing aides)  Cognition Arousal/Alertness: Awake/alert Behavior During Therapy: WFL for tasks assessed/performed Overall Cognitive Status: Within Functional Limits for tasks assessed                                        General Comments General comments (skin integrity, edema, etc.): on 2L O2 at rest with SpO2 97%, ambulated on RA as dropped to 90% at rest; then after ambulation 85% on RA, so O2 reapplied    Exercises     Assessment/Plan    PT Assessment Patient needs continued PT services  PT Problem List Decreased mobility;Decreased balance;Decreased activity tolerance;Pain  PT Treatment Interventions DME instruction;Gait training;Therapeutic exercise;Patient/family education;Therapeutic activities;Functional mobility training;Balance training;Stair training    PT Goals (Current goals can be found in the Care Plan section)  Acute Rehab PT Goals Patient Stated Goal: To go to rehab PT Goal Formulation: With patient Time For Goal Achievement: 11/11/16 Potential to Achieve Goals: Good    Frequency Min 3X/week   Barriers to discharge Decreased caregiver support      Co-evaluation               AM-PAC PT "6 Clicks" Daily Activity  Outcome Measure Difficulty turning over in  bed (including adjusting bedclothes, sheets and blankets)?: A Lot Difficulty moving from lying on back to sitting on the side of the bed? : A Lot Difficulty sitting down on and standing up from a chair with arms (e.g., wheelchair, bedside commode, etc,.)?: Total Help needed moving to and from a bed to chair (including a wheelchair)?: A Little Help needed walking in hospital room?: A Little Help needed climbing 3-5 steps with a railing? : A Little 6 Click Score: 14    End of Session Equipment Utilized During Treatment: Gait belt Activity Tolerance: Patient tolerated treatment well Patient left: in chair;with call bell/phone within reach   PT Visit Diagnosis: Other abnormalities of gait and mobility (R26.89);Pain Pain - Right/Left: Right Pain - part of body: Shoulder    Time: 1030-1047 PT Time Calculation (min) (ACUTE ONLY): 17 min   Charges:   PT Evaluation $PT Eval Moderate Complexity: 1 Procedure     PT G CodesMagda Kiel, PT [phone] 11/04/2016   Reginia Naas 11/04/2016, 1:05 PM

## 2016-11-04 NOTE — Progress Notes (Signed)
ANIKA SHORE  MRN: [id number] DOB/Age: 1941/03/14 76 y.o. Physician: Rada Hay Procedure: Procedure(s) (LRB): RIGHT TOTAL SHOULDER ARTHROPLASTY (Right)     Subjective: Pt states this shoulder is more painful than the left. Daughter is not available to help so plan will be for DC to skilled  Vital Signs Temp:  [97.7 F (36.5 C)-98.1 F (36.7 C)] 97.9 F (36.6 C) (07/13 0544) Pulse Rate:  [90-101] 98 (07/13 0544) Resp:  [16] 16 (07/13 0544) BP: (107-132)/(47-63) 132/59 (07/13 0544) SpO2:  [93 %-98 %] 94 % (07/13 0544)  Lab Results  Recent Labs  11/02/16 1449  WBC 4.4  HGB 10.9*  HCT 33.8*  PLT 250   BMET  Recent Labs  11/02/16 1449 11/03/16 0816  NA 140 136  K 4.6 4.0  CL 101 105  CO2 19* 21*  GLUCOSE 105* 96  BUN 30* 34*  CREATININE 1.56* 1.56*  CALCIUM 9.4 9.8   No results found for: INR   Exam NVI R dressing dry        Plan Begin bed search for short term skilled  [facility] PA-C  for Dr.Kevin Supple 11/04/2016, 2:02 PM Contact # [phone]

## 2016-11-04 NOTE — NC FL2 (Signed)
[place] LEVEL OF CARE SCREENING TOOL     IDENTIFICATION  Patient Name: Bailey Ayala Birthdate: 26-Jul-1940 Sex: female Admission Date (Current Location): 11/03/2016  [facility] and [place] Number:  Engineering geologist and Address:  The [place]. [facility], [place] [address] [place], [place] [address]      Provider Number: [id number]  Attending Physician Name and Address:  Justice Britain, MD  Relative Name and Phone Number:       Current Level of Care: Hospital Recommended Level of Care: [place] Prior Approval Number:    Date Approved/Denied:   PASRR Number: [id number]  Discharge Plan: SNF    Current Diagnoses: Patient Active Problem List   Diagnosis Date Noted  . Status post total shoulder arthroplasty 11/03/2016  . Advanced care planning/counseling discussion 11/02/2016  . S/P shoulder replacement 01/14/2016  . Fatigue 09/29/2015  . Hypothyroid 11/04/2014  . Hyperlipidemia   . Hypertension   . Varicose vein of leg 09/30/2014  . Peripheral arterial occlusive disease ([place]) 09/30/2014    Orientation RESPIRATION BLADDER Height & Weight     Self, Time, Situation, Place  O2 (3L [place]) Continent Weight:   Height:     BEHAVIORAL SYMPTOMS/MOOD NEUROLOGICAL BOWEL NUTRITION STATUS      Continent Diet (see DC summary)  AMBULATORY STATUS COMMUNICATION OF NEEDS Skin   Limited Assist Verbally Surgical wounds                       Personal Care Assistance Level of Assistance  Bathing, Dressing Bathing Assistance: Limited assistance   Dressing Assistance: Limited assistance     Functional Limitations Info             SPECIAL CARE FACTORS FREQUENCY  PT (By licensed PT), OT (By licensed OT)     PT Frequency: 5/wk OT Frequency: 5/wk            Contractures      Additional Factors Info  Code Status, Allergies Code Status Info: FULL Allergies Info: Codeine, Simvastatin           Current Medications  (11/04/2016):  This is the current hospital active medication list Current Facility-Administered Medications  Medication Dose Route Frequency Provider Last Rate Last Dose  . acetaminophen (TYLENOL) tablet 650 mg  650 mg Oral Q6H PRN Shuford, Tracy, PA-C       Or  . acetaminophen (TYLENOL) suppository 650 mg  650 mg Rectal Q6H PRN Shuford, Tracy, PA-C      . alum & mag hydroxide-simeth (MAALOX/MYLANTA) 200-200-20 MG/5ML suspension 30 mL  30 mL Oral Q4H PRN Shuford, Tracy, PA-C   30 mL at 11/04/16 0032  . aspirin EC tablet 81 mg  81 mg Oral Daily Shuford, Tracy, PA-C   81 mg at 11/04/16 9678  . atorvastatin (LIPITOR) tablet 20 mg  20 mg Oral q1800 Shuford, Tracy, PA-C   20 mg at 11/03/16 1832  . benazepril (LOTENSIN) tablet 20 mg  20 mg Oral Daily Shuford, Tracy, PA-C   20 mg at 11/04/16 0829  . bisacodyl (DULCOLAX) EC tablet 5 mg  5 mg Oral Daily PRN Shuford, Olivia Mackie, PA-C      . diazepam (VALIUM) tablet 2 mg  2 mg Oral Q6H PRN Shuford, Tracy, PA-C      . diphenhydrAMINE (BENADRYL) 12.5 MG/5ML elixir 12.5-25 mg  12.5-25 mg Oral Q4H PRN Shuford, Tracy, PA-C      . docusate sodium (COLACE) capsule 100 mg  100 mg Oral BID Shuford, [place], [place]      . HYDROmorphone (DILAUDID) injection 1 mg  1 mg Intravenous Q2H PRN Shuford, Tracy, PA-C   1 mg at 11/04/16 0318  . lactated ringers infusion   Intravenous Continuous Janeece Riggers, MD 10 mL/hr at 11/03/16 [phone]    . lactated ringers infusion   Intravenous Continuous Shuford, Olivia Mackie, PA-C 75 mL/hr at 11/03/16 1835    . levothyroxine (SYNTHROID, LEVOTHROID) tablet 112 mcg  112 mcg Oral QAC breakfast Shuford, Tracy, PA-C   112 mcg at 11/04/16 0533  . menthol-cetylpyridinium (CEPACOL) lozenge 3 mg  1 lozenge Oral PRN Shuford, Olivia Mackie, PA-C       Or  . phenol (CHLORASEPTIC) mouth spray 1 spray  1 spray Mouth/Throat PRN Shuford, Olivia Mackie, PA-C      . metoCLOPramide (REGLAN) tablet 5-10 mg  5-10 mg Oral Q8H PRN Shuford, Tracy, PA-C       Or  . metoCLOPramide (REGLAN)  injection 5-10 mg  5-10 mg Intravenous Q8H PRN Shuford, Tracy, PA-C      . ondansetron (ZOFRAN) tablet 4 mg  4 mg Oral Q6H PRN Shuford, Tracy, PA-C       Or  . ondansetron (ZOFRAN) injection 4 mg  4 mg Intravenous Q6H PRN Shuford, Tracy, PA-C      . oxyCODONE (Oxy IR/ROXICODONE) immediate release tablet 5-10 mg  5-10 mg Oral Q3H PRN Shuford, Tracy, PA-C   5 mg at 11/04/16 0318  . polyethylene glycol (MIRALAX / GLYCOLAX) packet 17 g  17 g Oral Daily PRN Shuford, Tracy, PA-C      . sodium phosphate (FLEET) 7-19 GM/118ML enema 1 enema  1 enema Rectal Once PRN Shuford, Olivia Mackie, PA-C         Discharge Medications: Please see discharge summary for a list of discharge medications.  Relevant Imaging Results:  Relevant Lab Results:   Additional Information SS#: [id number]  Jorge Ny, LCSW

## 2016-11-04 NOTE — Clinical Social Work Note (Signed)
Clinical Social Work Assessment  Patient Details  Name: Bailey Ayala MRN: [id number] Date of Birth: [birth year]  Date of referral:  11/04/16               Reason for consult:  Facility Placement                Permission sought to share information with:  Facility Art therapist granted to share information::  Yes, Verbal Permission Granted  Name::        Agency::  SNF  Relationship::     Contact Information:     Housing/Transportation Living arrangements for the past 2 months:  Single Family Home Source of Information:  Patient Patient Interpreter Needed:  None Criminal Activity/Legal Involvement Pertinent to Current Situation/Hospitalization:  No - Comment as needed Significant Relationships:  Adult Children Lives with:  Self Do you feel safe going back to the place where you live?  No Need for family participation in patient care:  No (Coment)  Care giving concerns:  Pt lives at home alone- would not have access to consistent physical assist at home when discharged and has increased impairment following surgery.   Social Worker assessment / plan:  CSW spoke with pt about PT recommendation for SNF.  Explained SNF and SNF referral process.  Employment status:  Retired Nurse, adult PT Recommendations:  [place] / Referral to community resources:  [place]  Patient/Family's Response to care:  Pt is agreeable to SNF and prefers [facility] for rehab stay.  Patient/Family's Understanding of and Emotional Response to Diagnosis, Current Treatment, and Prognosis:  No questions or concerns pt  Hopeful she can regain baseline mobility with rehab stay.  Emotional Assessment Appearance:  Appears stated age Attitude/Demeanor/Rapport:    Affect (typically observed):  Appropriate, Accepting Orientation:  Oriented to Self, Oriented to Place, Oriented to  Time, Oriented to Situation Alcohol /  Substance use:  Not Applicable Psych involvement (Current and /or in the community):  No (Comment)  Discharge Needs  Concerns to be addressed:  Care Coordination Readmission within the last 30 days:  No Current discharge risk:  Physical Impairment Barriers to Discharge:  Continued Medical Work up   Jorge Ny, LCSW 11/04/2016, 10:52 AM

## 2016-11-04 NOTE — Progress Notes (Signed)
Occupational Therapy Evaluation Patient Details Name: Bailey Ayala MRN: [id number] DOB: [birth year] Today's Date: 11/04/2016    History of Present Illness 76 yo s/p R TSA. PMH L TSA. DM. COPD. TIA. See chart for further PMH.    Clinical Impression   PTA, pt lived alone and was independent with ADL and mobility. Pt making excellent progress. Began education on compensatory techniques for ADL and RUE per protocol. Pt plans to DC to SNF for rehab. Pt will benefit from OT skilled services to maximize independence and increase functional use of RUE.     Follow Up Recommendations  SNF    Equipment Recommendations  None recommended by OT    Recommendations for Other Services PT consult     Precautions / Restrictions Precautions Precautions: Shoulder Type of Shoulder Precautions: Passive - 30-60-90 (ER, Abd, FF). Pendulums OK Shoulder Interventions: Shoulder sling/immobilizer;Off for dressing/bathing/exercises Precaution Booklet Issued: Yes (comment) Required Braces or Orthoses: Sling Restrictions Weight Bearing Restrictions: Yes RUE Weight Bearing: Non weight bearing      Mobility Bed Mobility               General bed mobility comments: up in chair  Transfers Overall transfer level: Needs assistance Equipment used: 1 person hand held assist Transfers: Sit to/from Stand Sit to Stand: Min guard         General transfer comment: for balance    Balance Overall balance assessment: Needs assistance   Sitting balance-Leahy Scale: Good       Standing balance-Leahy Scale: Fair                             ADL either performed or assessed with clinical judgement   ADL Overall ADL's : Needs assistance/impaired Eating/Feeding: Set up   Grooming: Moderate assistance   Upper Body Bathing: Moderate assistance   Lower Body Bathing: Minimal assistance;Sit to/from stand   Upper Body Dressing : Moderate assistance   Lower Body Dressing: Moderate  assistance;Sit to/from stand   Toilet Transfer: Designer, fashion/clothing and Hygiene: Moderate assistance         General ADL Comments: Reviewed compensatory techniques for ADL. Educated pt on focus of OT at SNF for ADL and IADL.      Vision Baseline Vision/History: Wears glasses       Perception     Praxis      Pertinent Vitals/Pain Pain Assessment: 0-10 Pain Score: 6  Pain Location: R anterior shoulder Pain Descriptors / Indicators: Discomfort;Sore Pain Intervention(s): Monitored during session;Repositioned;Ice applied     Hand Dominance Right   Extremity/Trunk Assessment Upper Extremity Assessment Upper Extremity Assessment: RUE deficits/detail;LUE deficits/detail RUE Deficits / Details: good effort with ex. Able to tolerate full elbow AAROM, ER to neutral, FF to 60 and Abd to 45 RUE Coordination: decreased fine motor;decreased gross motor LUE Deficits / Details: s/p L TSA   Lower Extremity Assessment Lower Extremity Assessment: Overall WFL for tasks assessed   Cervical / Trunk Assessment Cervical / Trunk Assessment: Normal   Communication Communication Communication: Other (comment);No difficulties (has hearing aides)   Cognition Arousal/Alertness: Awake/alert Behavior During Therapy: WFL for tasks assessed/performed Overall Cognitive Status: Within Functional Limits for tasks assessed                                     General Comments  on 2L O2  at rest with SpO2 97%, ambulated on RA as dropped to 90% at rest; then after ambulation 85% on RA, so O2 reapplied    Exercises Exercises: Other exercises;Shoulder Shoulder Exercises Pendulum Exercise: Right;PROM;Other (comment);Standing (dangle) Shoulder Flexion: PROM;Right;10 reps;Supine (0-60 achieved) Shoulder ABduction: PROM;Right;10 reps;Seated (0-45) Shoulder External Rotation: PROM;Seated;Both (able to achieve neutral) Elbow Flexion: AAROM;Right;15 reps Elbow  Extension: AAROM;Right;15 reps Wrist Flexion: AROM;Right;10 reps Wrist Extension: AROM;Right;10 reps Digit Composite Flexion: AROM;Right;10 reps Composite Extension: AROM;10 reps;Right Other Exercises Other Exercises: education on ice/edema control/positioning   Shoulder Instructions Shoulder Instructions Donning/doffing shirt without moving shoulder: Moderate assistance Method for sponge bathing under operated UE: Moderate assistance Donning/doffing sling/immobilizer: Moderate assistance Correct positioning of sling/immobilizer: Moderate assistance Pendulum exercises (written home exercise program): Minimal assistance ROM for elbow, wrist and digits of operated UE: Minimal assistance Sling wearing schedule (on at all times/off for ADL's): Minimal assistance Proper positioning of operated UE when showering: Minimal assistance Dressing change: Minimal assistance Positioning of UE while sleeping: Moderate assistance    Home Living Family/patient expects to be discharged to:: Skilled nursing facility [facility]) Living Arrangements: Alone                               Additional Comments: lives alone in two level home, stays on main, has 10 steps to enter      Prior Functioning/Environment Level of Independence: Independent                 OT Problem List: Decreased strength;Decreased range of motion;Decreased coordination;Decreased safety awareness;Decreased knowledge of use of DME or AE;Impaired sensation;Pain;Impaired UE functional use      OT Treatment/Interventions: Self-care/ADL training;Therapeutic exercise;DME and/or AE instruction;Therapeutic activities;Balance training;Cognitive remediation/compensation    OT Goals(Current goals can be found in the care plan section) Acute Rehab OT Goals Patient Stated Goal: To go to rehab OT Goal Formulation: With patient Time For Goal Achievement: 11/18/16 Potential to Achieve Goals: Good ADL Goals Pt Will  Perform Upper Body Dressing: with min assist;sitting Pt Will Transfer to Toilet: with supervision;bedside commode Pt/caregiver will Perform Home Exercise Program: Increased ROM;Right Upper extremity;With written HEP provided;With Supervision (per protocol) Additional ADL Goal #1: Pt will independently verbalize understanding of positionoing of RUE when sitting in controled position  OT Frequency: Min 3X/week   Barriers to D/C: Decreased caregiver support          Co-evaluation              AM-PAC PT "6 Clicks" Daily Activity     Outcome Measure Help from another person eating meals?: A Little Help from another person taking care of personal grooming?: A Lot Help from another person toileting, which includes using toliet, bedpan, or urinal?: A Little Help from another person bathing (including washing, rinsing, drying)?: A Lot Help from another person to put on and taking off regular upper body clothing?: A Lot Help from another person to put on and taking off regular lower body clothing?: A Lot 6 Click Score: 14   End of Session Nurse Communication: Mobility status  Activity Tolerance: Patient tolerated treatment well Patient left: in chair;with call bell/phone within reach  OT Visit Diagnosis: Muscle weakness (generalized) (M62.81);Pain Pain - Right/Left: Right Pain - part of body: Shoulder                Time: 6270-3500 OT Time Calculation (min): 33 min Charges:  OT General Charges $OT Visit: 1 Procedure OT Evaluation $  OT Eval Moderate Complexity: 1 Procedure OT Treatments $Self Care/Home Management : 8-22 mins G-Codes:     [facility], OT/L  [phone] 11/04/2016  Khaza Blansett,HILLARY 11/04/2016, 2:19 PM

## 2016-11-05 NOTE — Progress Notes (Signed)
Occupational Therapy Treatment Patient Details Name: Bailey Ayala MRN: [id number] DOB: Oct 15, 1940 Today's Date: 11/05/2016    History of present illness 76 yo s/p R TSA. PMH L TSA. DM. COPD. TIA. See chart for further PMH.    OT comments  Pt. Making gains.  Able to return demo and complete R shoulder ROM including pendulums.  Note d/c likely tomorrow to snf for continued therapy.  Will continue to follow acutely.    Follow Up Recommendations  SNF    Equipment Recommendations  None recommended by OT    Recommendations for Other Services      Precautions / Restrictions Precautions Precautions: Shoulder Type of Shoulder Precautions: Passive - 30-60-90 (ER, Abd, FF). Pendulums OK Shoulder Interventions: Shoulder sling/immobilizer;Off for dressing/bathing/exercises Required Braces or Orthoses: Sling Restrictions RUE Weight Bearing: Non weight bearing       Mobility Bed Mobility Overal bed mobility: Needs Assistance Bed Mobility: Supine to Sit     Supine to sit: HOB elevated;Min guard     General bed mobility comments: LUE to pull on bed rail and guide b les out of bed, no physical assistance required  Transfers Overall transfer level: Needs assistance Equipment used: None Transfers: Sit to/from Stand Sit to Stand: Min guard              Balance                                           ADL either performed or assessed with clinical judgement   ADL Overall ADL's : Needs assistance/impaired                         Toilet Transfer: Magazine features editor Details (indicate cue type and reason): Cues for not furniture walking with items that move ie: open doors while walking through the doorway Toileting- Water quality scientist and Hygiene: Min guard;Sit to/from stand       Functional mobility during ADLs: Min guard       Vision       Perception     Praxis      Cognition Arousal/Alertness: Awake/alert Behavior  During Therapy: WFL for tasks assessed/performed Overall Cognitive Status: Within Functional Limits for tasks assessed                                          Exercises Shoulder Exercises Pendulum Exercise: Right;PROM;Other (comment);Standing Shoulder Flexion: PROM;Right;10 reps;Seated Shoulder ABduction: PROM;Right;10 reps;Seated Shoulder External Rotation: PROM;Seated;Both Elbow Flexion: AAROM;Right;15 reps Elbow Extension: AAROM;Right;15 reps Wrist Flexion: AROM;Right;10 reps Wrist Extension: AROM;Right;10 reps Digit Composite Flexion: AROM;Right;10 reps Composite Extension: AROM;10 reps;Right   Shoulder Instructions       General Comments      Pertinent Vitals/ Pain       Pain Assessment: No/denies pain  Home Living                                          Prior Functioning/Environment              Frequency  Min 3X/week        Progress Toward Goals  OT Goals(current goals can now be  found in the care plan section)  Progress towards OT goals: Progressing toward goals     Plan Discharge plan remains appropriate    Co-evaluation                 AM-PAC PT "6 Clicks" Daily Activity     Outcome Measure   Help from another person eating meals?: A Little Help from another person taking care of personal grooming?: A Lot Help from another person toileting, which includes using toliet, bedpan, or urinal?: A Little Help from another person bathing (including washing, rinsing, drying)?: A Lot Help from another person to put on and taking off regular upper body clothing?: A Lot Help from another person to put on and taking off regular lower body clothing?: A Lot 6 Click Score: 14    End of Session    OT Visit Diagnosis: Muscle weakness (generalized) (M62.81);Pain Pain - Right/Left: Right Pain - part of body: Shoulder   Activity Tolerance Patient tolerated treatment well   Patient Left in bed;with call  bell/phone within reach   Nurse Communication Other (comment) (pt. asking if IV needed to be hooked back up and also o2. rn reports iv not needed or o2, pt. was 96% on roomair)        Time: 5670-1410 OT Time Calculation (min): 25 min  Charges: OT General Charges $OT Visit: 1 Procedure OT Treatments $Therapeutic Exercise: 23-37 mins   Janice Coffin, COTA/L 11/05/2016, 9:39 AM

## 2016-11-05 NOTE — Progress Notes (Signed)
Subjective: 2 Days Post-Op Procedure(s) (LRB): RIGHT TOTAL SHOULDER ARTHROPLASTY (Right) Patient reports pain as mild.  Feeling much better today  Objective: Vital signs in last 24 hours: Temp:  [98.1 F (36.7 C)-98.3 F (36.8 C)] 98.3 F (36.8 C) (07/14 0456) Pulse Rate:  [94-102] 102 (07/14 0456) Resp:  [16-18] 18 (07/14 0456) BP: (114-139)/(52-65) 139/52 (07/14 0456) SpO2:  [95 %-97 %] 97 % (07/14 0456)  Intake/Output from previous day: 07/13 0701 - 07/14 0700 In: 600 [P.O.:600] Out: -  Intake/Output this shift: No intake/output data recorded.   Recent Labs  11/02/16 1449  HGB 10.9*    Recent Labs  11/02/16 1449  WBC 4.4  RBC 3.34*  HCT 33.8*  PLT 250    Recent Labs  11/02/16 1449 11/03/16 0816  NA 140 136  K 4.6 4.0  CL 101 105  CO2 19* 21*  BUN 30* 34*  CREATININE 1.56* 1.56*  GLUCOSE 105* 96  CALCIUM 9.4 9.8   No results for input(s): LABPT, INR in the last 72 hours.  Neurologically intact Neurovascular intact No cellulitis present Compartment soft  Assessment/Plan: 2 Days Post-Op Procedure(s) (LRB): RIGHT TOTAL SHOULDER ARTHROPLASTY (Right) Advance diet Up with therapy D/C IV fluids Plan for discharge tomorrow to SNF  Brian Kocourek V 11/05/2016, 8:22 AM

## 2016-11-06 ENCOUNTER — Encounter: Payer: Self-pay | Admitting: Family Medicine

## 2016-11-06 DIAGNOSIS — I739 Peripheral vascular disease, unspecified: Secondary | ICD-10-CM | POA: Diagnosis not present

## 2016-11-06 DIAGNOSIS — Z471 Aftercare following joint replacement surgery: Secondary | ICD-10-CM | POA: Diagnosis not present

## 2016-11-06 DIAGNOSIS — G894 Chronic pain syndrome: Secondary | ICD-10-CM | POA: Diagnosis not present

## 2016-11-06 DIAGNOSIS — I10 Essential (primary) hypertension: Secondary | ICD-10-CM | POA: Diagnosis not present

## 2016-11-06 DIAGNOSIS — K5903 Drug induced constipation: Secondary | ICD-10-CM | POA: Diagnosis not present

## 2016-11-06 DIAGNOSIS — M6281 Muscle weakness (generalized): Secondary | ICD-10-CM | POA: Diagnosis not present

## 2016-11-06 DIAGNOSIS — E039 Hypothyroidism, unspecified: Secondary | ICD-10-CM | POA: Diagnosis not present

## 2016-11-06 DIAGNOSIS — J410 Simple chronic bronchitis: Secondary | ICD-10-CM | POA: Diagnosis not present

## 2016-11-06 DIAGNOSIS — R262 Difficulty in walking, not elsewhere classified: Secondary | ICD-10-CM | POA: Diagnosis not present

## 2016-11-06 DIAGNOSIS — E785 Hyperlipidemia, unspecified: Secondary | ICD-10-CM | POA: Diagnosis not present

## 2016-11-06 DIAGNOSIS — Z96611 Presence of right artificial shoulder joint: Secondary | ICD-10-CM | POA: Diagnosis not present

## 2016-11-06 DIAGNOSIS — M150 Primary generalized (osteo)arthritis: Secondary | ICD-10-CM | POA: Diagnosis not present

## 2016-11-06 DIAGNOSIS — Z7982 Long term (current) use of aspirin: Secondary | ICD-10-CM | POA: Diagnosis not present

## 2016-11-06 DIAGNOSIS — Z87891 Personal history of nicotine dependence: Secondary | ICD-10-CM | POA: Diagnosis not present

## 2016-11-06 DIAGNOSIS — M19011 Primary osteoarthritis, right shoulder: Secondary | ICD-10-CM | POA: Diagnosis not present

## 2016-11-06 DIAGNOSIS — Z8673 Personal history of transient ischemic attack (TIA), and cerebral infarction without residual deficits: Secondary | ICD-10-CM | POA: Diagnosis not present

## 2016-11-06 DIAGNOSIS — G5622 Lesion of ulnar nerve, left upper limb: Secondary | ICD-10-CM | POA: Diagnosis not present

## 2016-11-06 NOTE — Progress Notes (Addendum)
CSW received a call from [place] has been accepted by: [facility] SN[facility] Number for report is: [phone] Pt's unit/room/bed number will be: [id number] Accepting physician: SNF MD  Pt can arrive ASAP on 11/06/16  CSW will update RN.  Alphonse Guild. Elliannah Wayment, Reed Pandy, CSI Clinical Social Worker Ph: [phone]  Plan for DC Sunday- SNF wknd phone is [phone] Will go to Rm 202 Report #: [id number]

## 2016-11-06 NOTE — Clinical Social Work Placement (Signed)
   CLINICAL SOCIAL WORK PLACEMENT  NOTE  Date:  11/06/2016  Patient Details  Name: Bailey Ayala MRN: [id number] Date of Birth: 19-Jul-1940  Clinical Social Work is seeking post-discharge placement for this patient at the [place] level of care (*CSW will initial, date and re-position this form in  chart as items are completed):  Yes   Patient/family provided with [facility]'s list of facilities offering this level of care within the geographic area requested by the patient (or if unable, by the patient's family).  Yes   Patient/family informed of their freedom to choose among providers that offer the needed level of care, that participate in Medicare, Medicaid or managed care program needed by the patient, have an available bed and are willing to accept the patient.  Yes   Patient/family informed of [facility] ownership interest in [facility] and [facility], as well as of the fact that they are under no obligation to receive care at these facilities.  PASRR submitted to EDS on       PASRR number received on       Existing PASRR number confirmed on 11/04/16     FL2 transmitted to all facilities in geographic area requested by pt/family on 11/04/16     FL2 transmitted to all facilities within larger geographic area on       Patient informed that his/her managed care company has contracts with or will negotiate with certain facilities, including the following:        Yes   Patient/family informed of bed offers received.  Patient chooses bed at  [facility])     Physician recommends and patient chooses bed at      Patient to be transferred to   on 11/06/16.  Patient to be transferred to facility by  [name])     Patient family notified on 11/06/16 of transfer.  Name of family member notified:  [name] via HIPPA-compliant VM, pt's daughter [name] does not have VM set up.     PHYSICIAN       Additional  Comment:    _______________________________________________ Claudine Mouton, LCSWA 11/06/2016, 10:36 AM

## 2016-11-06 NOTE — Progress Notes (Addendum)
Occupational Therapy Treatment Patient Details Name: Bailey Ayala MRN: [id number] DOB: [birth year] Today's Date: 11/06/2016    History of present illness 76 yo s/p R TSA. PMH L TSA. DM. COPD. TIA. See chart for further PMH.    OT comments  Pt. Able to complete PROM exercises to RUE.  States she is feeling good today.  Agreeable to sit up in chair.   Follow Up Recommendations  SNF    Equipment Recommendations  None recommended by OT    Recommendations for Other Services      Precautions / Restrictions Precautions Precautions: Shoulder Type of Shoulder Precautions: Passive - 30-60-90 (ER, Abd, FF). Pendulums OK Shoulder Interventions: Shoulder sling/immobilizer;Off for dressing/bathing/exercises Required Braces or Orthoses: Sling Restrictions Weight Bearing Restrictions: Yes RUE Weight Bearing: Non weight bearing       Mobility Bed Mobility               General bed mobility comments: pt seated in recliner upon arrival into room and at end of session  Transfers Overall transfer level: Needs assistance Equipment used: None Transfers: Sit to/from Stand;Stand Pivot Transfers Sit to Stand: Min guard Stand pivot transfers: Min guard       General transfer comment: pt. reaching for items and surfaces during amb. to/from b.room. requires cues for safe surfaces to hold onto    Balance                                           ADL either performed or assessed with clinical judgement   ADL Overall ADL's : Needs assistance/impaired                         Toilet Transfer: Min guard;Ambulation;Comfort height toilet Toilet Transfer Details (indicate cue type and reason): continued to require cues not to hold onto items that move during ambulation Toileting- Clothing Manipulation and Hygiene: Supervision/safety;Sitting/lateral lean               Vision       Perception     Praxis      Cognition Arousal/Alertness:  Awake/alert Behavior During Therapy: WFL for tasks assessed/performed Overall Cognitive Status: Within Functional Limits for tasks assessed                                          Exercises Shoulder Exercises Pendulum Exercise: Right;PROM;Standing Elbow Flexion: PROM;Right;15 reps;Standing Elbow Extension: PROM;Right;15 reps;Standing Wrist Flexion: AROM;Right;10 reps Wrist Extension: AROM;Right;10 reps Composite Extension: AROM;10 reps;Right Pt. Declined ER, ABD, FF today.    Shoulder Instructions  reviewed handouts provided previously. Pt. States she did not want to do "all of those exercises today"     General Comments      Pertinent Vitals/ Pain       Pain Location: R shoulder Pain Descriptors / Indicators: Aching Pain Intervention(s): Patient requesting pain meds-RN notified  Home Living                                          Prior Functioning/Environment              Frequency  Min 3X/week  Progress Toward Goals  OT Goals(current goals can now be found in the care plan section)  Progress towards OT goals: Progressing toward goals     Plan Discharge plan remains appropriate    Co-evaluation                 AM-PAC PT "6 Clicks" Daily Activity     Outcome Measure   Help from another person eating meals?: A Little Help from another person taking care of personal grooming?: A Lot Help from another person toileting, which includes using toliet, bedpan, or urinal?: A Little Help from another person bathing (including washing, rinsing, drying)?: A Lot Help from another person to put on and taking off regular upper body clothing?: A Lot Help from another person to put on and taking off regular lower body clothing?: A Lot 6 Click Score: 14    End of Session    OT Visit Diagnosis: Muscle weakness (generalized) (M62.81);Pain Pain - Right/Left: Right Pain - part of body: Shoulder   Activity Tolerance  Patient tolerated treatment well   Patient Left in chair;with call bell/phone within reach   Nurse Communication Other (comment) (pt. requesting pain meds)        Time: 0762-2633 OT Time Calculation (min): 18 min  Charges: OT General Charges $OT Visit: 1 Procedure OT Treatments $Therapeutic Exercise: 8-22 mins   Janice Coffin, COTA/L 11/06/2016, 8:54 AM

## 2016-11-06 NOTE — Progress Notes (Signed)
Subjective: 3 Days Post-Op Procedure(s) (LRB): RIGHT TOTAL SHOULDER ARTHROPLASTY (Right) Patient reports pain as mild.  Feeling well today.  No complaints  Objective: Vital signs in last 24 hours: Temp:  [98 F (36.7 C)-98.5 F (36.9 C)] 98 F (36.7 C) (07/15 0639) Pulse Rate:  [96-105] 96 (07/15 0639) Resp:  [18] 18 (07/14 1332) BP: (119-126)/(49-57) 126/57 (07/15 0639) SpO2:  [90 %-94 %] 94 % (07/15 0639)  Intake/Output from previous day: 07/14 0701 - 07/15 0700 In: 360 [P.O.:360] Out: -  Intake/Output this shift: No intake/output data recorded.  No results for input(s): HGB in the last 72 hours. No results for input(s): WBC, RBC, HCT, PLT in the last 72 hours. No results for input(s): NA, K, CL, CO2, BUN, CREATININE, GLUCOSE, CALCIUM in the last 72 hours. No results for input(s): LABPT, INR in the last 72 hours.  Neurologically intact Neurovascular intact No cellulitis present Compartment soft Bandage c/d/i.  Assessment/Plan: 3 Days Post-Op Procedure(s) (LRB): RIGHT TOTAL SHOULDER ARTHROPLASTY (Right) Advance diet Up with therapy D/C IV fluids Plan for discharge today to SNF  Nicholes Stairs 11/06/2016, 8:58 AM

## 2016-11-06 NOTE — Discharge Summary (Signed)
Patient ID: Bailey Ayala MRN: [id number] DOB/AGE: 1940/06/05 76 y.o.  Admit date: 11/03/2016 Discharge date:   Primary Diagnosis: Right shoulder OA  Admission Diagnoses:  Past Medical History:  Diagnosis Date  . Arthritis   . Blood dyscrasia    BLEEDS EASILY   . COPD (chronic obstructive pulmonary disease) ([place])   . Diabetes mellitus without complication ([place])   . Diffuse cystic mastopathy   . Hyperlipidemia   . Hypertension   . Hypothyroidism   . L4-L5 disc bulge   . PAD (peripheral artery disease) ([place])   . Shortness of breath dyspnea    WITH EXERTION   . Stroke ([place])    TIA     2016     WAS FOUND ON SCAN ORDERED FROM NEUROL  . Thyroid disease   . TIA (transient ischemic attack) 2016   left hand weakness   Discharge Diagnoses:   Active Problems:   S/P shoulder replacement   Status post total shoulder arthroplasty  Estimated body mass index is 30.95 kg/m as calculated from the following:   Height as of 10/27/16: '5\' 5"'  (1.651 m).   Weight as of 11/02/16: 84.4 kg (186 lb).  Procedure:  Procedure(s) (LRB): RIGHT TOTAL SHOULDER ARTHROPLASTY (Right)   Consults: None  HPI: [name] presented to [facility] hospital for right total shoulder arthroplasty.  She has previously failed all conservative management options and elected to proceed during this hospitalization for surgery.  Laboratory Data: Admission on 11/03/2016  Component Date Value Ref Range Status  . Sodium 11/03/2016 136  135 - 145 mmol/L Final  . Potassium 11/03/2016 4.0  3.5 - 5.1 mmol/L Final  . Chloride 11/03/2016 105  101 - 111 mmol/L Final  . CO2 11/03/2016 21* 22 - 32 mmol/L Final  . Glucose, Bld 11/03/2016 96  65 - 99 mg/dL Final  . BUN 11/03/2016 34* 6 - 20 mg/dL Final  . Creatinine, Ser 11/03/2016 1.56* 0.44 - 1.00 mg/dL Final  . Calcium 11/03/2016 9.8  8.9 - 10.3 mg/dL Final  . GFR calc non Af Amer 11/03/2016 31* >60 mL/min Final  . GFR calc Af Amer 11/03/2016 36* >60 mL/min Final   Comment: (NOTE) The eGFR has been calculated using the CKD EPI equation. This calculation has not been validated in all clinical situations. eGFR's persistently <60 mL/min signify possible Chronic Kidney Disease.   . Anion gap 11/03/2016 10  5 - 15 Final  . Glucose-Capillary 11/03/2016 106* 65 - 99 mg/dL Final  Office Visit on 11/02/2016  Component Date Value Ref Range Status  . WBC 11/02/2016 4.4  3.4 - 10.8 x10E3/uL Final  . RBC 11/02/2016 3.34* 3.77 - 5.28 x10E6/uL Final  . Hemoglobin 11/02/2016 10.9* 11.1 - 15.9 g/dL Final  . Hematocrit 11/02/2016 33.8* 34.0 - 46.6 % Final  . MCV 11/02/2016 101* 79 - 97 fL Final  . MCH 11/02/2016 32.6  26.6 - 33.0 pg Final  . MCHC 11/02/2016 32.2  31.5 - 35.7 g/dL Final  . RDW 11/02/2016 14.3  12.3 - 15.4 % Final  . Platelets 11/02/2016 250  150 - 379 x10E3/uL Final  . Neutrophils 11/02/2016 58  Not Estab. % Final  . Lymphs 11/02/2016 29  Not Estab. % Final  . Monocytes 11/02/2016 9  Not Estab. % Final  . Eos 11/02/2016 4  Not Estab. % Final  . Basos 11/02/2016 0  Not Estab. % Final  . Neutrophils Absolute 11/02/2016 2.6  1.4 - 7.0 x10E3/uL Final  . Lymphocytes Absolute  11/02/2016 1.3  0.7 - 3.1 x10E3/uL Final  . Monocytes Absolute 11/02/2016 0.4  0.1 - 0.9 x10E3/uL Final  . EOS (ABSOLUTE) 11/02/2016 0.2  0.0 - 0.4 x10E3/uL Final  . Basophils Absolute 11/02/2016 0.0  0.0 - 0.2 x10E3/uL Final  . Immature Granulocytes 11/02/2016 0  Not Estab. % Final  . Immature Grans (Abs) 11/02/2016 0.0  0.0 - 0.1 x10E3/uL Final  . Glucose 11/02/2016 105* 65 - 99 mg/dL Final  . BUN 11/02/2016 30* 8 - 27 mg/dL Final  . Creatinine, Ser 11/02/2016 1.56* 0.57 - 1.00 mg/dL Final  . GFR calc non Af Amer 11/02/2016 32* >59 mL/min/1.73 Final  . GFR calc Af Amer 11/02/2016 37* >59 mL/min/1.73 Final  . BUN/Creatinine Ratio 11/02/2016 19  12 - 28 Final  . Sodium 11/02/2016 140  134 - 144 mmol/L Final  . Potassium 11/02/2016 4.6  3.5 - 5.2 mmol/L Final  . Chloride  11/02/2016 101  96 - 106 mmol/L Final  . CO2 11/02/2016 19* 20 - 29 mmol/L Final  . Calcium 11/02/2016 9.4  8.7 - 10.3 mg/dL Final  . Total Protein 11/02/2016 7.6  6.0 - 8.5 g/dL Final  . Albumin 11/02/2016 4.7  3.5 - 4.8 g/dL Final  . Globulin, Total 11/02/2016 2.9  1.5 - 4.5 g/dL Final  . Albumin/Globulin Ratio 11/02/2016 1.6  1.2 - 2.2 Final  . Bilirubin Total 11/02/2016 0.2  0.0 - 1.2 mg/dL Final  . Alkaline Phosphatase 11/02/2016 88  39 - 117 IU/L Final  . AST 11/02/2016 20  0 - 40 IU/L Final  . ALT 11/02/2016 18  0 - 32 IU/L Final  . Cholesterol, Total 11/02/2016 160  100 - 199 mg/dL Final  . Triglycerides 11/02/2016 262* 0 - 149 mg/dL Final  . HDL 11/02/2016 44  >39 mg/dL Final  . VLDL Cholesterol Cal 11/02/2016 52* 5 - 40 mg/dL Final  . LDL Calculated 11/02/2016 64  0 - 99 mg/dL Final  . Chol/HDL Ratio 11/02/2016 3.6  0.0 - 4.4 ratio Final   Comment:                                   T. Chol/HDL Ratio                                             Men  Women                               1/2 Avg.Risk  3.4    3.3                                   Avg.Risk  5.0    4.4                                2X Avg.Risk  9.6    7.1                                3X Avg.Risk 23.4   11.0   . TSH 11/02/2016 0.451  0.450 - 4.500 uIU/mL  Final  . Specific Gravity, UA 11/02/2016 1.020  1.005 - 1.030 Final  . pH, UA 11/02/2016 5.0  5.0 - 7.5 Final  . Color, UA 11/02/2016 Yellow  Yellow Final  . Appearance Ur 11/02/2016 Clear  Clear Final  . Leukocytes, UA 11/02/2016 Trace* Negative Final  . Protein, UA 11/02/2016 Negative  Negative/Trace Final  . Glucose, UA 11/02/2016 Negative  Negative Final  . Ketones, UA 11/02/2016 Negative  Negative Final  . RBC, UA 11/02/2016 Negative  Negative Final  . Bilirubin, UA 11/02/2016 Negative  Negative Final  . Urobilinogen, Ur 11/02/2016 0.2  0.2 - 1.0 mg/dL Final  . Nitrite, UA 11/02/2016 Negative  Negative Final  Hospital Outpatient Visit on 10/24/2016    Component Date Value Ref Range Status  . MRSA, PCR 10/24/2016 NEGATIVE  NEGATIVE Final  . Staphylococcus aureus 10/24/2016 NEGATIVE  NEGATIVE Final   Comment:        The Xpert SA Assay (FDA approved for NASAL specimens in patients over 62 years of age), is one component of a comprehensive surveillance program.  Test performance has been validated by [facility] for patients greater than or equal to 29 year old. It is not intended to diagnose infection nor to guide or monitor treatment.   . WBC 10/24/2016 6.5  4.0 - 10.5 K/uL Final  . RBC 10/24/2016 3.40* 3.87 - 5.11 MIL/uL Final  . Hemoglobin 10/24/2016 11.1* 12.0 - 15.0 g/dL Final  . HCT 10/24/2016 34.7* 36.0 - 46.0 % Final  . MCV 10/24/2016 102.1* 78.0 - 100.0 fL Final  . MCH 10/24/2016 32.6  26.0 - 34.0 pg Final  . MCHC 10/24/2016 32.0  30.0 - 36.0 g/dL Final  . RDW 10/24/2016 13.9  11.5 - 15.5 % Final  . Platelets 10/24/2016 229  150 - 400 K/uL Final  . Sodium 10/24/2016 136  135 - 145 mmol/L Final  . Potassium 10/24/2016 4.2  3.5 - 5.1 mmol/L Final  . Chloride 10/24/2016 102  101 - 111 mmol/L Final  . CO2 10/24/2016 25  22 - 32 mmol/L Final  . Glucose, Bld 10/24/2016 112* 65 - 99 mg/dL Final  . BUN 10/24/2016 30* 6 - 20 mg/dL Final  . Creatinine, Ser 10/24/2016 1.62* 0.44 - 1.00 mg/dL Final  . Calcium 10/24/2016 9.1  8.9 - 10.3 mg/dL Final  . GFR calc non Af Amer 10/24/2016 30* >60 mL/min Final  . GFR calc Af Amer 10/24/2016 35* >60 mL/min Final   Comment: (NOTE) The eGFR has been calculated using the CKD EPI equation. This calculation has not been validated in all clinical situations. eGFR's persistently <60 mL/min signify possible Chronic Kidney Disease.   . Anion gap 10/24/2016 9  5 - 15 Final  Office Visit on 10/06/2016  Component Date Value Ref Range Status  . Glucose 10/06/2016 88  65 - 99 mg/dL Final  . BUN 10/06/2016 24  8 - 27 mg/dL Final  . Creatinine, Ser 10/06/2016 1.45* 0.57 - 1.00 mg/dL Final  . GFR  calc non Af Amer 10/06/2016 35* >59 mL/min/1.73 Final  . GFR calc Af Amer 10/06/2016 41* >59 mL/min/1.73 Final  . BUN/Creatinine Ratio 10/06/2016 17  12 - 28 Final  . Sodium 10/06/2016 139  134 - 144 mmol/L Final  . Potassium 10/06/2016 4.3  3.5 - 5.2 mmol/L Final  . Chloride 10/06/2016 102  96 - 106 mmol/L Final  . CO2 10/06/2016 23  20 - 29 mmol/L Final                 **  Please note reference interval change**  . Calcium 10/06/2016 9.0  8.7 - 10.3 mg/dL Final  . ALT (SGPT) Piccolo, Waived 10/06/2016 15  10 - 47 U/L Final  . AST (SGOT) Piccolo, Waived 10/06/2016 29  11 - 38 U/L Final  . Cholesterol Piccolo, Waived 10/06/2016 156  <200 mg/dL Final   Comment:                         Desirable                <200                         Borderline High      200- 239                         High                     >239   . HDL Chol Piccolo, Waived 10/06/2016 108  >59 mg/dL Final   Comment:                         Low HDL- Risk Factor     < 40                         High HDL- Negative       > 59                          Risk Factor (Desirable)   . Triglycerides Piccolo,Waived 10/06/2016 199* <150 mg/dL Final   Comment:                         Normal                   <150                         Borderline High     150 - 199                         High                200 - 499                         Very High                >499   . Chol/HDL Ratio Piccolo,Waive 10/06/2016 3.2  mg/dL Final   Comment:                                        Female      Female                         Low Risk      < 5.0      < 4.5                         High Risk     > 4.9      >  4.4   . LDL Chol Calc Piccolo Waived 10/06/2016 68  <100 mg/dL Final   Comment:                         Optimal                  <100                         Near Optimal        100 - 129                         Borderline High     130 - 159                         High                160 - 189                         Very  High                >189   . VLDL Chol Calc Piccolo,Waive 10/06/2016 40* <30 mg/dL Final   Comment:                         Normal                   < 30                         High                     > 29      X-Rays:No results found.  EKG: Orders placed or performed in visit on 08/05/16  . EKG 12-Lead     Hospital Course: MINNETTA SANDORA is a 76 y.o. who was admitted to Hospital. They were brought to the operating room on 11/03/2016 and underwent Procedure(s): RIGHT TOTAL SHOULDER ARTHROPLASTY.  Patient tolerated the procedure well and was later transferred to the recovery room and then to the orthopaedic floor for postoperative care.  They were given PO and IV analgesics for pain control following their surgery.  They were given 24 hours of postoperative antibiotics of  Anti-infectives    Start     Dose/Rate Route Frequency Ordered Stop   11/03/16 1500  ceFAZolin (ANCEF) IVPB 2g/100 mL premix     2 g 200 mL/hr over 30 Minutes Intravenous Every 6 hours 11/03/16 1312 11/04/16 0348   11/03/16 1000  ceFAZolin (ANCEF) IVPB 2g/100 mL premix     2 g 200 mL/hr over 30 Minutes Intravenous To [facility] 11/02/16 0744 11/03/16 1002     and started on DVT prophylaxis in the form of asa and SCDs.   PT and OT were ordered for total joint protocol.  Discharge planning consulted to help with postop disposition and equipment needs.  She continued to show better pain control and mobility with PT.  By day three, the patient had progressed with therapy and meeting their goals.  Incision was healing well.  Patient was seen in rounds and was ready to go to her SNF.   Diet: Regular diet Activity:NWB Follow-up:in 2 weeks Disposition - Skilled nursing facility Discharged  Condition: good   Discharge Instructions    Call MD / Call 911    Complete by:  As directed    If you experience chest pain or shortness of breath, CALL 911 and be transported to the hospital emergency room.  If you  develope a fever above 101 F, pus (white drainage) or increased drainage or redness at the wound, or calf pain, call your surgeon's office.   Constipation Prevention    Complete by:  As directed    Drink plenty of fluids.  Prune juice may be helpful.  You may use a stool softener, such as Colace (over the counter) 100 mg twice a day.  Use MiraLax (over the counter) for constipation as needed.   Diet - low sodium heart healthy    Complete by:  As directed    Increase activity slowly as tolerated    Complete by:  As directed      Allergies as of 11/06/2016      Reactions   Codeine Other (See Comments)   hallucinations    Simvastatin Diarrhea, Nausea Only      Medication List    TAKE these medications   aspirin EC 81 MG tablet Take 1 tablet (81 mg total) by mouth daily.   atorvastatin 20 MG tablet Commonly known as:  LIPITOR TAKE 1 TABLET(20 MG) BY MOUTH DAILY   benazepril 20 MG tablet Commonly known as:  LOTENSIN Take 1 tablet (20 mg total) by mouth daily.   CENTRUM SILVER ULTRA WOMENS PO Take 1 tablet by mouth daily.   ibuprofen 200 MG tablet Commonly known as:  ADVIL,MOTRIN Take 400 mg by mouth every 8 (eight) hours as needed for mild pain.   levothyroxine 112 MCG tablet Commonly known as:  SYNTHROID, LEVOTHROID Take 1 tablet (112 mcg total) by mouth daily.   traMADol 50 MG tablet Commonly known as:  ULTRAM Take 1 tablet (50 mg total) by mouth at bedtime as needed for moderate pain.      Follow-up Information    Justice Britain, MD Follow up.   Specialty:  Orthopedic Surgery Why:  call to be seen in 10-14 days Contact information: [address] [place] [address] [phone]           Signed: Geralynn Rile, MD Orthopaedic Surgery 11/06/2016, 9:03 AM

## 2016-11-07 DIAGNOSIS — M8949 Other hypertrophic osteoarthropathy, multiple sites: Secondary | ICD-10-CM | POA: Insufficient documentation

## 2016-11-07 DIAGNOSIS — K5903 Drug induced constipation: Secondary | ICD-10-CM | POA: Insufficient documentation

## 2016-11-07 DIAGNOSIS — J410 Simple chronic bronchitis: Secondary | ICD-10-CM | POA: Diagnosis not present

## 2016-11-07 DIAGNOSIS — M150 Primary generalized (osteo)arthritis: Secondary | ICD-10-CM | POA: Diagnosis not present

## 2016-11-07 DIAGNOSIS — M159 Polyosteoarthritis, unspecified: Secondary | ICD-10-CM | POA: Insufficient documentation

## 2016-11-16 ENCOUNTER — Encounter: Payer: Self-pay | Admitting: Gerontology

## 2016-11-16 ENCOUNTER — Non-Acute Institutional Stay (SKILLED_NURSING_FACILITY): Payer: Medicare Other | Admitting: Gerontology

## 2016-11-16 DIAGNOSIS — M19011 Primary osteoarthritis, right shoulder: Secondary | ICD-10-CM | POA: Diagnosis not present

## 2016-11-16 NOTE — Progress Notes (Signed)
Location:   [facility] Room Number: [place] of Service:  SNF [id number])  Provider: Toni Arthurs, NP-C  PCP: Guadalupe Maple, MD Patient Care Team: Guadalupe Maple, MD as PCP - General (Family Medicine) Christene Lye, MD (General Surgery) Guadalupe Maple, MD (Family Medicine) Birder Robson, MD as Referring Physician (Ophthalmology) Vladimir Crofts, MD (Neurology)  Extended Emergency Contact Information Primary Emergency Contact: Debbora Dus, [place] Phone: [phone] Mobile Phone: [phone] Relation: Daughter Secondary Emergency Contact: [place] Phone: [phone] Mobile Phone: [phone] Relation: Daughter  Code Status: DNR Goals of care:  Advanced Directive information Advanced Directives 11/03/2016  Does Patient Have a Medical Advance Directive? Yes  Type of Paramedic of [place];Living will  Does patient want to make changes to medical advance directive? -  Copy of [place] in Chart? Yes  Would patient like information on creating a medical advance directive? -     Allergies  Allergen Reactions  . Codeine Other (See Comments)    hallucinations    . Simvastatin Diarrhea and Nausea Only    Chief Complaint  Patient presents with  . Discharge Note    Discharged from SNF    HPI:  76 y.o. female seen today for discharge evaluation. Pt was admitted to the facility for rehab following hospitalization at [facility] for Right Total Shoulder Arthroplasty. She has been participating in PT and OT. She reports her pain is well controlled. Her appetite is good, having regular BMs. Pt follows instructions for performing ROM exercises for the the elbow and wrist frequently. Pt was concerned her incision was stinging. Dressing was removed to check incision. Incision was well approximated with surgical adhesive.  No redness, no drainage. Nontender. Incision recovered with Island dressing. Pt reports she is relieved we checked the incision- puts her mind at ease that it is ol. Pt says she has a f/u with Orthopedics tomorrow. Pt denies n/v/d/f/c/cp/sob/ha/abd pain/dizziness/cough. VSS. No other complaints.     Past Medical History:  Diagnosis Date  . Arthritis   . Blood dyscrasia    BLEEDS EASILY   . COPD (chronic obstructive pulmonary disease) ([place])   . Diabetes mellitus without complication ([place])   . Diffuse cystic mastopathy   . Hyperlipidemia   . Hypertension   . Hypothyroidism   . L4-L5 disc bulge   . Osteoporosis, post-menopausal   . PAD (peripheral artery disease) ([place])   . Shortness of breath dyspnea    WITH EXERTION   . Stroke ([place])    TIA     2016     WAS FOUND ON SCAN ORDERED FROM NEUROL  . Thyroid disease   . TIA (transient ischemic attack) 2016   left hand weakness    Past Surgical History:  Procedure Laterality Date  . ABDOMINAL HYSTERECTOMY  1990  . BACK SURGERY  2011  . BREAST EXCISIONAL BIOPSY Left    2010?   Marland Kitchen CATARACT EXTRACTION, BILATERAL    . COLONOSCOPY  1998  . EYE SURGERY Bilateral 05/06/14  . JOINT REPLACEMENT     LT KNEE  . KNEE SURGERY Left 2012  . OOPHORECTOMY  1990  . parathyroid transplant     2/3 THYROID REMOVED   (GOITER)  . SPINE SURGERY    . THYROID SURGERY    . TONSILLECTOMY    . TOTAL SHOULDER ARTHROPLASTY Left  01/14/2016   Procedure: LEFT TOTAL SHOULDER ARTHROPLASTY;  Surgeon: Justice Britain, MD;  Location: [place];  Service: Orthopedics;  Laterality: Left;  . TOTAL SHOULDER ARTHROPLASTY Right 11/03/2016   Procedure: RIGHT TOTAL SHOULDER ARTHROPLASTY;  Surgeon: Justice Britain, MD;  Location: [place];  Service: Orthopedics;  Laterality: Right;  . ULNAR NERVE REPAIR     LEFT  . vein closure procedure Left 2010      reports that she quit smoking about 27 years ago. She has a 50.00 pack-year smoking history. She has never used smokeless tobacco. She  reports that she drinks about 1.2 oz of alcohol per week . She reports that she does not use drugs. Social History   Social History  . Marital status: Widowed    Spouse name: N/A  . Number of children: N/A  . Years of education: N/A   Occupational History  . Not on file.   Social History Main Topics  . Smoking status: Former Smoker    Packs/day: 2.00    Years: 25.00    Quit date: 11/03/1989  . Smokeless tobacco: Never Used  . Alcohol use 1.2 oz/week    2 Shots of liquor per week     Comment: 2x weekly-whiskey occ recently  . Drug use: No  . Sexual activity: Not on file   Other Topics Concern  . Not on file   Social History Narrative  . No narrative on file   Functional Status Survey:    Allergies  Allergen Reactions  . Codeine Other (See Comments)    hallucinations    . Simvastatin Diarrhea and Nausea Only    Pertinent  Health Maintenance Due  Topic Date Due  . COLONOSCOPY  10/06/2017 (Originally 05/15/1991)  . INFLUENZA VACCINE  11/23/2016  . DEXA SCAN  Completed  . PNA vac Low Risk Adult  Completed    Medications: Allergies as of 11/16/2016      Reactions   Codeine Other (See Comments)   hallucinations    Simvastatin Diarrhea, Nausea Only      Medication List       Accurate as of 11/16/16 12:34 PM. Always use your most recent med list.          acetaminophen 325 MG tablet Commonly known as:  TYLENOL Take 650 mg by mouth every 6 (six) hours as needed. Maximum dose for 24 hours is 3000 mg from all sources of Acetaminophen/ Tylenol   aspirin EC 81 MG tablet Take 1 tablet (81 mg total) by mouth daily.   atorvastatin 20 MG tablet Commonly known as:  LIPITOR TAKE 1 TABLET(20 MG) BY MOUTH DAILY   benazepril 20 MG tablet Commonly known as:  LOTENSIN Take 1 tablet (20 mg total) by mouth daily.   CENTRUM SILVER ULTRA WOMENS PO Take 1 tablet by mouth daily.   ibuprofen 200 MG tablet Commonly known as:  ADVIL,MOTRIN Take 400 mg by mouth every 8  (eight) hours as needed for mild pain.   levothyroxine 112 MCG tablet Commonly known as:  SYNTHROID, LEVOTHROID Take 1 tablet (112 mcg total) by mouth daily.   sennosides-docusate sodium 8.6-50 MG tablet Commonly known as:  SENOKOT-S Take 2 tablets by mouth 2 (two) times daily.   traMADol 50 MG tablet Commonly known as:  ULTRAM Take 50 mg by mouth every 4 (four) hours as needed for moderate pain.       Review of Systems  Constitutional: Negative for activity change, appetite change, chills, diaphoresis and fever.  HENT:  Negative for congestion, sneezing, sore throat, trouble swallowing and voice change.   Respiratory: Negative for apnea, cough, choking, chest tightness, shortness of breath and wheezing.   Cardiovascular: Negative for chest pain, palpitations and leg swelling.  Gastrointestinal: Negative for abdominal distention, abdominal pain, constipation, diarrhea and nausea.  Genitourinary: Negative for difficulty urinating, dysuria, frequency and urgency.  Musculoskeletal: Positive for arthralgias (typical arthritis). Negative for back pain, gait problem and myalgias.  Skin: Positive for wound. Negative for color change, pallor and rash.  Neurological: Negative for dizziness, tremors, syncope, speech difficulty, weakness, numbness and headaches.  Psychiatric/Behavioral: Negative for agitation and behavioral problems.  All other systems reviewed and are negative.   Vitals:   11/16/16 1217  BP: (!) 142/79  Pulse: 80  Resp: 18  Temp: 98 F (36.7 C)  SpO2: 96%  Weight: 185 lb 3.2 oz (84 kg)  Height: 5\' 5"  (1.651 m)   Body mass index is 30.82 kg/m. Physical Exam  Constitutional: She is oriented to person, place, and time. Vital signs are normal. She appears well-developed and well-nourished. She is active and cooperative. She does not appear ill. No distress.  HENT:  Head: Normocephalic and atraumatic.  Mouth/Throat: Uvula is midline, oropharynx is clear and moist and  mucous membranes are normal. Mucous membranes are not pale, not dry and not cyanotic.  Eyes: Pupils are equal, round, and reactive to light. Conjunctivae, EOM and lids are normal.  Neck: Trachea normal, normal range of motion and full passive range of motion without pain. Neck supple. No JVD present. No tracheal deviation, no edema and no erythema present. No thyromegaly present.  Cardiovascular: Normal rate, regular rhythm, normal heart sounds, intact distal pulses and normal pulses.  Exam reveals no gallop, no distant heart sounds and no friction rub.   No murmur heard. Pulmonary/Chest: Effort normal and breath sounds normal. No accessory muscle usage. No respiratory distress. She has no wheezes. She has no rales. She exhibits no tenderness.  Abdominal: Normal appearance and bowel sounds are normal. She exhibits no distension and no ascites. There is no tenderness.  Musculoskeletal: She exhibits no edema.       Right shoulder: She exhibits decreased range of motion, tenderness and laceration. She exhibits no swelling, no effusion and normal pulse.  Expected osteoarthritis, stiffness. Calves soft, supple. Negative Homan's sign  Neurological: She is alert and oriented to person, place, and time. She has normal strength.  Skin: Skin is warm and dry. Laceration (right TSA) noted. No rash noted. She is not diaphoretic. No cyanosis or erythema. No pallor. Nails show no clubbing.  Psychiatric: She has a normal mood and affect. Her speech is normal and behavior is normal. Judgment and thought content normal. Cognition and memory are normal.  Nursing note and vitals reviewed.   Labs reviewed: Basic Metabolic Panel:  Recent Labs  10/24/16 1422 11/02/16 1449 11/03/16 0816  NA 136 140 136  K 4.2 4.6 4.0  CL 102 101 105  CO2 25 19* 21*  GLUCOSE 112* 105* 96  BUN 30* 30* 34*  CREATININE 1.62* 1.56* 1.56*  CALCIUM 9.1 9.4 9.8   Liver Function Tests:  Recent Labs  12/17/15 0850 04/06/16 1618  10/06/16 1324 11/02/16 1449  AST 23 16 29 20   ALT 18 16 15 18   ALKPHOS 101 102  --  88  BILITOT 0.3 <0.2  --  0.2  PROT 7.4 7.4  --  7.6  ALBUMIN 4.5 4.4  --  4.7   No results for  input(s): LIPASE, AMYLASE in the last 8760 hours. No results for input(s): AMMONIA in the last 8760 hours. CBC:  Recent Labs  04/06/16 1618 10/24/16 1422 11/02/16 1449  WBC 6.2 6.5 4.4  NEUTROABS 3.8  --  2.6  HGB 11.3 11.1* 10.9*  HCT 34.3 34.7* 33.8*  MCV 97 102.1* 101*  PLT 225 229 250   Cardiac Enzymes: No results for input(s): CKTOTAL, CKMB, CKMBINDEX, TROPONINI in the last 8760 hours. BNP: Invalid input(s): POCBNP CBG:  Recent Labs  11/03/16 1143  GLUCAP 106*    Procedures and Imaging Studies During Stay: No results found.  Assessment/Plan:   1. Primary osteoarthritis of right shoulder  Continue PT/OT  Continue exercises as taught by PT/OT  Dressing/incision care per protocol  Frequent ROM of elbow, wrist  Ice to shoulder prn for pain, edema  Tramadol 50 mg po Q 4 hours prn x 20, no refills  Tylenol 650 mg po Q 4 hours prn  Ibuprofen 400 mg po Q 8 hours prn pain  Follow up with Orthopedist asap for continuity of care  Patient is being discharged with the following home health services: HHPT/OT through [facility]   Patient is being discharged with the following durable medical equipment: none, except sling    Patient has been advised to f/u with their PCP in 1-2 weeks to bring them up to date on their rehab stay.  Social services at facility was responsible for arranging this appointment.  Pt was provided with a 30 day supply of prescriptions for medications and refills must be obtained from their PCP.  For controlled substances, a more limited supply may be provided adequate until PCP appointment only.  Future labs/tests needed:    Family/ staff Communication:   Total Time:  Documentation:  Face to Face:  Family/Phone:  Vikki Ports,  NP-C [facility] [address], [place] [address] Cell Phone (Mon-Fri 8am-5pm):  [phone] On Call:  [phone] & follow prompts after 5pm & weekends Office Phone:  [phone] Office Fax:  [phone]

## 2016-11-17 DIAGNOSIS — Z471 Aftercare following joint replacement surgery: Secondary | ICD-10-CM | POA: Diagnosis not present

## 2016-11-17 DIAGNOSIS — Z96611 Presence of right artificial shoulder joint: Secondary | ICD-10-CM | POA: Diagnosis not present

## 2016-11-18 DIAGNOSIS — J449 Chronic obstructive pulmonary disease, unspecified: Secondary | ICD-10-CM | POA: Diagnosis not present

## 2016-11-18 DIAGNOSIS — D759 Disease of blood and blood-forming organs, unspecified: Secondary | ICD-10-CM | POA: Diagnosis not present

## 2016-11-18 DIAGNOSIS — E1151 Type 2 diabetes mellitus with diabetic peripheral angiopathy without gangrene: Secondary | ICD-10-CM | POA: Diagnosis not present

## 2016-11-18 DIAGNOSIS — M5126 Other intervertebral disc displacement, lumbar region: Secondary | ICD-10-CM | POA: Diagnosis not present

## 2016-11-18 DIAGNOSIS — Z471 Aftercare following joint replacement surgery: Secondary | ICD-10-CM | POA: Diagnosis not present

## 2016-11-18 DIAGNOSIS — M159 Polyosteoarthritis, unspecified: Secondary | ICD-10-CM | POA: Diagnosis not present

## 2016-11-22 DIAGNOSIS — M159 Polyosteoarthritis, unspecified: Secondary | ICD-10-CM | POA: Diagnosis not present

## 2016-11-22 DIAGNOSIS — Z471 Aftercare following joint replacement surgery: Secondary | ICD-10-CM | POA: Diagnosis not present

## 2016-11-22 DIAGNOSIS — M5126 Other intervertebral disc displacement, lumbar region: Secondary | ICD-10-CM | POA: Diagnosis not present

## 2016-11-22 DIAGNOSIS — D759 Disease of blood and blood-forming organs, unspecified: Secondary | ICD-10-CM | POA: Diagnosis not present

## 2016-11-22 DIAGNOSIS — J449 Chronic obstructive pulmonary disease, unspecified: Secondary | ICD-10-CM | POA: Diagnosis not present

## 2016-11-22 DIAGNOSIS — E1151 Type 2 diabetes mellitus with diabetic peripheral angiopathy without gangrene: Secondary | ICD-10-CM | POA: Diagnosis not present

## 2016-11-24 DIAGNOSIS — M5126 Other intervertebral disc displacement, lumbar region: Secondary | ICD-10-CM | POA: Diagnosis not present

## 2016-11-24 DIAGNOSIS — D759 Disease of blood and blood-forming organs, unspecified: Secondary | ICD-10-CM | POA: Diagnosis not present

## 2016-11-24 DIAGNOSIS — Z471 Aftercare following joint replacement surgery: Secondary | ICD-10-CM | POA: Diagnosis not present

## 2016-11-24 DIAGNOSIS — M159 Polyosteoarthritis, unspecified: Secondary | ICD-10-CM | POA: Diagnosis not present

## 2016-11-24 DIAGNOSIS — J449 Chronic obstructive pulmonary disease, unspecified: Secondary | ICD-10-CM | POA: Diagnosis not present

## 2016-11-24 DIAGNOSIS — E1151 Type 2 diabetes mellitus with diabetic peripheral angiopathy without gangrene: Secondary | ICD-10-CM | POA: Diagnosis not present

## 2016-11-28 DIAGNOSIS — M5126 Other intervertebral disc displacement, lumbar region: Secondary | ICD-10-CM | POA: Diagnosis not present

## 2016-11-28 DIAGNOSIS — M159 Polyosteoarthritis, unspecified: Secondary | ICD-10-CM | POA: Diagnosis not present

## 2016-11-28 DIAGNOSIS — J449 Chronic obstructive pulmonary disease, unspecified: Secondary | ICD-10-CM | POA: Diagnosis not present

## 2016-11-28 DIAGNOSIS — Z471 Aftercare following joint replacement surgery: Secondary | ICD-10-CM | POA: Diagnosis not present

## 2016-11-28 DIAGNOSIS — E1151 Type 2 diabetes mellitus with diabetic peripheral angiopathy without gangrene: Secondary | ICD-10-CM | POA: Diagnosis not present

## 2016-11-28 DIAGNOSIS — D759 Disease of blood and blood-forming organs, unspecified: Secondary | ICD-10-CM | POA: Diagnosis not present

## 2016-11-29 DIAGNOSIS — Z471 Aftercare following joint replacement surgery: Secondary | ICD-10-CM | POA: Diagnosis not present

## 2016-11-29 DIAGNOSIS — M159 Polyosteoarthritis, unspecified: Secondary | ICD-10-CM | POA: Diagnosis not present

## 2016-11-29 DIAGNOSIS — D759 Disease of blood and blood-forming organs, unspecified: Secondary | ICD-10-CM | POA: Diagnosis not present

## 2016-11-29 DIAGNOSIS — J449 Chronic obstructive pulmonary disease, unspecified: Secondary | ICD-10-CM | POA: Diagnosis not present

## 2016-11-29 DIAGNOSIS — M5126 Other intervertebral disc displacement, lumbar region: Secondary | ICD-10-CM | POA: Diagnosis not present

## 2016-11-29 DIAGNOSIS — E1151 Type 2 diabetes mellitus with diabetic peripheral angiopathy without gangrene: Secondary | ICD-10-CM | POA: Diagnosis not present

## 2016-11-30 DIAGNOSIS — E1151 Type 2 diabetes mellitus with diabetic peripheral angiopathy without gangrene: Secondary | ICD-10-CM | POA: Diagnosis not present

## 2016-11-30 DIAGNOSIS — Z471 Aftercare following joint replacement surgery: Secondary | ICD-10-CM | POA: Diagnosis not present

## 2016-11-30 DIAGNOSIS — D759 Disease of blood and blood-forming organs, unspecified: Secondary | ICD-10-CM | POA: Diagnosis not present

## 2016-11-30 DIAGNOSIS — J449 Chronic obstructive pulmonary disease, unspecified: Secondary | ICD-10-CM | POA: Diagnosis not present

## 2016-11-30 DIAGNOSIS — M5126 Other intervertebral disc displacement, lumbar region: Secondary | ICD-10-CM | POA: Diagnosis not present

## 2016-11-30 DIAGNOSIS — M159 Polyosteoarthritis, unspecified: Secondary | ICD-10-CM | POA: Diagnosis not present

## 2016-12-01 DIAGNOSIS — M159 Polyosteoarthritis, unspecified: Secondary | ICD-10-CM | POA: Diagnosis not present

## 2016-12-01 DIAGNOSIS — Z471 Aftercare following joint replacement surgery: Secondary | ICD-10-CM | POA: Diagnosis not present

## 2016-12-01 DIAGNOSIS — E1151 Type 2 diabetes mellitus with diabetic peripheral angiopathy without gangrene: Secondary | ICD-10-CM | POA: Diagnosis not present

## 2016-12-01 DIAGNOSIS — D759 Disease of blood and blood-forming organs, unspecified: Secondary | ICD-10-CM | POA: Diagnosis not present

## 2016-12-01 DIAGNOSIS — M5126 Other intervertebral disc displacement, lumbar region: Secondary | ICD-10-CM | POA: Diagnosis not present

## 2016-12-01 DIAGNOSIS — J449 Chronic obstructive pulmonary disease, unspecified: Secondary | ICD-10-CM | POA: Diagnosis not present

## 2016-12-06 DIAGNOSIS — M5126 Other intervertebral disc displacement, lumbar region: Secondary | ICD-10-CM | POA: Diagnosis not present

## 2016-12-06 DIAGNOSIS — J449 Chronic obstructive pulmonary disease, unspecified: Secondary | ICD-10-CM | POA: Diagnosis not present

## 2016-12-06 DIAGNOSIS — E1151 Type 2 diabetes mellitus with diabetic peripheral angiopathy without gangrene: Secondary | ICD-10-CM | POA: Diagnosis not present

## 2016-12-06 DIAGNOSIS — D759 Disease of blood and blood-forming organs, unspecified: Secondary | ICD-10-CM | POA: Diagnosis not present

## 2016-12-06 DIAGNOSIS — M159 Polyosteoarthritis, unspecified: Secondary | ICD-10-CM | POA: Diagnosis not present

## 2016-12-06 DIAGNOSIS — Z471 Aftercare following joint replacement surgery: Secondary | ICD-10-CM | POA: Diagnosis not present

## 2016-12-08 DIAGNOSIS — M159 Polyosteoarthritis, unspecified: Secondary | ICD-10-CM | POA: Diagnosis not present

## 2016-12-08 DIAGNOSIS — E1151 Type 2 diabetes mellitus with diabetic peripheral angiopathy without gangrene: Secondary | ICD-10-CM | POA: Diagnosis not present

## 2016-12-08 DIAGNOSIS — D759 Disease of blood and blood-forming organs, unspecified: Secondary | ICD-10-CM | POA: Diagnosis not present

## 2016-12-08 DIAGNOSIS — J449 Chronic obstructive pulmonary disease, unspecified: Secondary | ICD-10-CM | POA: Diagnosis not present

## 2016-12-08 DIAGNOSIS — Z471 Aftercare following joint replacement surgery: Secondary | ICD-10-CM | POA: Diagnosis not present

## 2016-12-08 DIAGNOSIS — M5126 Other intervertebral disc displacement, lumbar region: Secondary | ICD-10-CM | POA: Diagnosis not present

## 2016-12-12 DIAGNOSIS — M5126 Other intervertebral disc displacement, lumbar region: Secondary | ICD-10-CM | POA: Diagnosis not present

## 2016-12-12 DIAGNOSIS — J449 Chronic obstructive pulmonary disease, unspecified: Secondary | ICD-10-CM | POA: Diagnosis not present

## 2016-12-12 DIAGNOSIS — D759 Disease of blood and blood-forming organs, unspecified: Secondary | ICD-10-CM | POA: Diagnosis not present

## 2016-12-12 DIAGNOSIS — Z471 Aftercare following joint replacement surgery: Secondary | ICD-10-CM | POA: Diagnosis not present

## 2016-12-12 DIAGNOSIS — E1151 Type 2 diabetes mellitus with diabetic peripheral angiopathy without gangrene: Secondary | ICD-10-CM | POA: Diagnosis not present

## 2016-12-12 DIAGNOSIS — M159 Polyosteoarthritis, unspecified: Secondary | ICD-10-CM | POA: Diagnosis not present

## 2016-12-13 DIAGNOSIS — J449 Chronic obstructive pulmonary disease, unspecified: Secondary | ICD-10-CM | POA: Diagnosis not present

## 2016-12-13 DIAGNOSIS — M159 Polyosteoarthritis, unspecified: Secondary | ICD-10-CM | POA: Diagnosis not present

## 2016-12-13 DIAGNOSIS — D759 Disease of blood and blood-forming organs, unspecified: Secondary | ICD-10-CM | POA: Diagnosis not present

## 2016-12-13 DIAGNOSIS — M5126 Other intervertebral disc displacement, lumbar region: Secondary | ICD-10-CM | POA: Diagnosis not present

## 2016-12-13 DIAGNOSIS — E1151 Type 2 diabetes mellitus with diabetic peripheral angiopathy without gangrene: Secondary | ICD-10-CM | POA: Diagnosis not present

## 2016-12-13 DIAGNOSIS — Z471 Aftercare following joint replacement surgery: Secondary | ICD-10-CM | POA: Diagnosis not present

## 2016-12-15 DIAGNOSIS — Z471 Aftercare following joint replacement surgery: Secondary | ICD-10-CM | POA: Diagnosis not present

## 2016-12-15 DIAGNOSIS — M5126 Other intervertebral disc displacement, lumbar region: Secondary | ICD-10-CM | POA: Diagnosis not present

## 2016-12-15 DIAGNOSIS — M159 Polyosteoarthritis, unspecified: Secondary | ICD-10-CM | POA: Diagnosis not present

## 2016-12-15 DIAGNOSIS — E1151 Type 2 diabetes mellitus with diabetic peripheral angiopathy without gangrene: Secondary | ICD-10-CM | POA: Diagnosis not present

## 2016-12-15 DIAGNOSIS — D759 Disease of blood and blood-forming organs, unspecified: Secondary | ICD-10-CM | POA: Diagnosis not present

## 2016-12-15 DIAGNOSIS — J449 Chronic obstructive pulmonary disease, unspecified: Secondary | ICD-10-CM | POA: Diagnosis not present

## 2016-12-16 DIAGNOSIS — Z471 Aftercare following joint replacement surgery: Secondary | ICD-10-CM | POA: Diagnosis not present

## 2016-12-16 DIAGNOSIS — Z96611 Presence of right artificial shoulder joint: Secondary | ICD-10-CM | POA: Diagnosis not present

## 2016-12-23 ENCOUNTER — Other Ambulatory Visit: Payer: Self-pay | Admitting: Cardiovascular Disease

## 2017-01-25 DIAGNOSIS — M25551 Pain in right hip: Secondary | ICD-10-CM | POA: Diagnosis not present

## 2017-01-25 DIAGNOSIS — Z471 Aftercare following joint replacement surgery: Secondary | ICD-10-CM | POA: Diagnosis not present

## 2017-01-25 DIAGNOSIS — Z96611 Presence of right artificial shoulder joint: Secondary | ICD-10-CM | POA: Diagnosis not present

## 2017-02-06 ENCOUNTER — Encounter: Payer: Self-pay | Admitting: Cardiovascular Disease

## 2017-02-06 ENCOUNTER — Ambulatory Visit (INDEPENDENT_AMBULATORY_CARE_PROVIDER_SITE_OTHER): Payer: Medicare Other | Admitting: Cardiovascular Disease

## 2017-02-06 VITALS — BP 126/80 | HR 71 | Ht 65.0 in | Wt 182.0 lb

## 2017-02-06 DIAGNOSIS — I779 Disorder of arteries and arterioles, unspecified: Secondary | ICD-10-CM

## 2017-02-06 DIAGNOSIS — R0789 Other chest pain: Secondary | ICD-10-CM | POA: Diagnosis not present

## 2017-02-06 DIAGNOSIS — I739 Peripheral vascular disease, unspecified: Secondary | ICD-10-CM | POA: Diagnosis not present

## 2017-02-06 DIAGNOSIS — E782 Mixed hyperlipidemia: Secondary | ICD-10-CM | POA: Diagnosis not present

## 2017-02-06 DIAGNOSIS — I10 Essential (primary) hypertension: Secondary | ICD-10-CM

## 2017-02-06 NOTE — Progress Notes (Signed)
Cardiology Office Note   Date:  02/06/2017   ID:  Bailey Ayala, DOB [birth year], MRN [id number]  PCP:  Guadalupe Maple, MD  Cardiologist:   Kathlyn Sacramento, MD   Chief Complaint  Patient presents with  . other    6 month follow up. patient c/o very bad chest pain about a month ago. Meds reviewed verbally with patient.       History of Present Illness: Bailey Ayala is a 76 y.o. female who is here today for follow-up visit regarding peripheral arterial disease. She has chronic medical conditions that include peripheral arterial disease with intermittent claudication, previous tobacco use, hyperlipidemia, COPD and hypertension.  She had a nuclear stress test done in July 2017 for exertional dyspnea which showed no evidence of ischemia with normal ejection fraction. The patient has known history of bilateral calf claudication due to SFA disease. ABI in 2017 was 0.57 on the right and 0.49 on the left. Duplex showed occlusion of the proximal to mid left SFA and borderline significant right SFA disease.  The patient developed a rash with cilostazol which was discontinued.  She had one episode of chest pain about one month ago which woke her up from sleep. It lasted for about 10 minutes with no recurrent symptoms. There has been no change in her activities and she denies exertional symptoms. Bilateral calf claudication continues to be stable and happens after walking about one quarter of a mile.   Past Medical History:  Diagnosis Date  . Arthritis   . Blood dyscrasia    BLEEDS EASILY   . COPD (chronic obstructive pulmonary disease) ([place])   . Diabetes mellitus without complication ([place])   . Diffuse cystic mastopathy   . Hyperlipidemia   . Hypertension   . Hypothyroidism   . L4-L5 disc bulge   . Osteoporosis, post-menopausal   . PAD (peripheral artery disease) ([place])   . Shortness of breath dyspnea    WITH EXERTION   . Stroke ([place])    TIA     2016     WAS FOUND ON SCAN  ORDERED FROM NEUROL  . Thyroid disease   . TIA (transient ischemic attack) 2016   left hand weakness    Past Surgical History:  Procedure Laterality Date  . ABDOMINAL HYSTERECTOMY  1990  . BACK SURGERY  2011  . BREAST EXCISIONAL BIOPSY Left    2010?   Marland Kitchen CATARACT EXTRACTION, BILATERAL    . COLONOSCOPY  1998  . EYE SURGERY Bilateral 05/06/14  . JOINT REPLACEMENT     LT KNEE  . KNEE SURGERY Left 2012  . OOPHORECTOMY  1990  . parathyroid transplant     2/3 THYROID REMOVED   (GOITER)  . SPINE SURGERY    . THYROID SURGERY    . TONSILLECTOMY    . TOTAL SHOULDER ARTHROPLASTY Left 01/14/2016   Procedure: LEFT TOTAL SHOULDER ARTHROPLASTY;  Surgeon: Justice Britain, MD;  Location: [place];  Service: Orthopedics;  Laterality: Left;  . TOTAL SHOULDER ARTHROPLASTY Right 11/03/2016   Procedure: RIGHT TOTAL SHOULDER ARTHROPLASTY;  Surgeon: Justice Britain, MD;  Location: [place];  Service: Orthopedics;  Laterality: Right;  . ULNAR NERVE REPAIR     LEFT  . vein closure procedure Left 2010     Current Outpatient Prescriptions  Medication Sig Dispense Refill  . aspirin EC 81 MG tablet Take 1 tablet (81 mg total) by mouth daily. 90 tablet 3  . atorvastatin (LIPITOR) 20 MG tablet TAKE 1  TABLET(20 MG) BY MOUTH DAILY 30 tablet 3  . benazepril (LOTENSIN) 20 MG tablet Take 1 tablet (20 mg total) by mouth daily. 30 tablet 12  . ibuprofen (ADVIL,MOTRIN) 200 MG tablet Take 400 mg by mouth every 8 (eight) hours as needed for mild pain.    Marland Kitchen levothyroxine (SYNTHROID, LEVOTHROID) 112 MCG tablet Take 1 tablet (112 mcg total) by mouth daily. 30 tablet 12  . Multiple Vitamins-Minerals (CENTRUM SILVER ULTRA WOMENS PO) Take 1 tablet by mouth daily.      No current facility-administered medications for this visit.     Allergies:   Codeine and Simvastatin    Social History:  The patient  reports that she quit smoking about 27 years ago. She has a 50.00 pack-year smoking history. She has never used smokeless tobacco.  She reports that she drinks about 1.2 oz of alcohol per week . She reports that she does not use drugs.   Family History:  The patient's family history includes Arthritis in her mother; Diabetes in her sister; Heart disease in her father and mother; Hyperlipidemia in her brother, father, and mother; Stroke in her mother.    ROS:  Please see the history of present illness.   Otherwise, review of systems are positive for none.   All other systems are reviewed and negative.    PHYSICAL EXAM: VS:  BP 126/80 (BP Location: Left Arm, Patient Position: Sitting, Cuff Size: Normal)   Pulse 71   Ht 5\' 5"  (1.651 m)   Wt 182 lb (82.6 kg)   BMI 30.29 kg/m  , BMI Body mass index is 30.29 kg/m. GEN: Well nourished, well developed, in no acute distress  HEENT: normal  Neck: no JVD, carotid bruits, or masses Cardiac: RRR; no murmurs, rubs, or gallops,no edema  Respiratory:  clear to auscultation bilaterally, normal work of breathing GI: soft, nontender, nondistended, + BS MS: no deformity or atrophy  Skin: warm and dry, no rash Neuro:  Strength and sensation are intact Psych: euthymic mood, full affect   EKG:  EKG is  ordered today.  EKG showed normal sinus rhythm with nonspecific ST changes. Possible old septal infarct which is not different from prior EKG.  Recent Labs: 11/02/2016: ALT 18; Hemoglobin 10.9; Platelets 250; TSH 0.451 11/03/2016: BUN 34; Creatinine, Ser 1.56; Potassium 4.0; Sodium 136    Lipid Panel    Component Value Date/Time   CHOL 160 11/02/2016 1449   CHOL 156 10/06/2016 1324   TRIG 262 (H) 11/02/2016 1449   TRIG 199 (H) 10/06/2016 1324   HDL 44 11/02/2016 1449   CHOLHDL 3.6 11/02/2016 1449   VLDL 40 (H) 10/06/2016 1324   LDLCALC 64 11/02/2016 1449      Wt Readings from Last 3 Encounters:  02/06/17 182 lb (82.6 kg)  11/16/16 185 lb 3.2 oz (84 kg)  11/02/16 186 lb (84.4 kg)      ASSESSMENT AND PLAN:   1. Peripheral arterial disease with moderate bilateral  calf claudication worse on the left side:   She is known to have bilateral SFA disease with occlusion on the left side.  She is allergic to cilostazol. Her claudication has been stable and currently is not lifestyle limiting.hus, I recommend continuing medical therapy.   2. Hyperlipidemia: Continue treatment with atorvastatin. Most recent lipid profile in  July showed an LDL of 64. Triglyceride was mildly elevated at 262.  3. Essential hypertension: Blood pressure is controlled on current medication.   4. One episode of chest pain:  Overall atypical and happen at night. Most likely GI. No exertional symptoms. Stress test last year was normal and EKG does not show any new changes. No further workup is recommended at the present time unless she has recurrent symptoms.   Disposition:   FU with me in 12 months.  Signed,  Kathlyn Sacramento, MD  02/06/2017 1:47 PM    [place]

## 2017-02-06 NOTE — Patient Instructions (Signed)
Medication Instructions: Continue same medications.   Labwork: None.   Procedures/Testing: None.   Follow-Up: 1 year with Dr. Ezequiel Macauley.   Any Additional Special Instructions Will Be Listed Below (If Applicable).     If you need a refill on your cardiac medications before your next appointment, please call your pharmacy.   

## 2017-04-25 DIAGNOSIS — I219 Acute myocardial infarction, unspecified: Secondary | ICD-10-CM

## 2017-04-25 HISTORY — DX: Acute myocardial infarction, unspecified: I21.9

## 2017-04-28 ENCOUNTER — Other Ambulatory Visit: Payer: Self-pay | Admitting: Cardiovascular Disease

## 2017-05-16 ENCOUNTER — Ambulatory Visit: Payer: Medicare Other | Admitting: Family Medicine

## 2017-05-29 ENCOUNTER — Encounter: Payer: Self-pay | Admitting: Family Medicine

## 2017-05-29 ENCOUNTER — Ambulatory Visit (INDEPENDENT_AMBULATORY_CARE_PROVIDER_SITE_OTHER): Payer: Medicare Other | Admitting: Family Medicine

## 2017-05-29 VITALS — BP 125/78 | HR 80 | Ht 65.0 in | Wt 179.0 lb

## 2017-05-29 DIAGNOSIS — N183 Chronic kidney disease, stage 3 unspecified: Secondary | ICD-10-CM | POA: Insufficient documentation

## 2017-05-29 DIAGNOSIS — E782 Mixed hyperlipidemia: Secondary | ICD-10-CM | POA: Diagnosis not present

## 2017-05-29 DIAGNOSIS — E039 Hypothyroidism, unspecified: Secondary | ICD-10-CM | POA: Diagnosis not present

## 2017-05-29 DIAGNOSIS — I10 Essential (primary) hypertension: Secondary | ICD-10-CM | POA: Diagnosis not present

## 2017-05-29 DIAGNOSIS — I130 Hypertensive heart and chronic kidney disease with heart failure and stage 1 through stage 4 chronic kidney disease, or unspecified chronic kidney disease: Secondary | ICD-10-CM | POA: Diagnosis not present

## 2017-05-29 DIAGNOSIS — I779 Disorder of arteries and arterioles, unspecified: Secondary | ICD-10-CM

## 2017-05-29 LAB — LP+ALT+AST PICCOLO, WAIVED
ALT (SGPT) Piccolo, Waived: 23 U/L (ref 10–47)
AST (SGOT) Piccolo, Waived: 28 U/L (ref 11–38)
Chol/HDL Ratio Piccolo,Waive: 3.1 mg/dL
Cholesterol Piccolo, Waived: 144 mg/dL (ref <200)
HDL Chol Piccolo, Waived: 47 mg/dL — ABNORMAL LOW (ref >59)
LDL Chol Calc Piccolo Waived: 63 mg/dL (ref <100)
Triglycerides Piccolo,Waived: 169 mg/dL — ABNORMAL HIGH (ref <150)
VLDL Chol Calc Piccolo,Waive: 34 mg/dL — ABNORMAL HIGH (ref <30)

## 2017-05-29 LAB — URINALYSIS, ROUTINE W REFLEX MICROSCOPIC
Bilirubin, UA: NEGATIVE
Glucose, UA: NEGATIVE
Ketones, UA: NEGATIVE
Nitrite, UA: NEGATIVE
Protein, UA: NEGATIVE
RBC, UA: NEGATIVE
Specific Gravity, UA: 1.015 (ref 1.005–1.030)
Urobilinogen, Ur: 0.2 mg/dL (ref 0.2–1.0)
pH, UA: 5.5 (ref 5.0–7.5)

## 2017-05-29 LAB — MICROSCOPIC EXAMINATION: Bacteria, UA: NONE SEEN

## 2017-05-29 MED ORDER — ATORVASTATIN CALCIUM 20 MG PO TABS: 20.0000 mg | ORAL_TABLET | Freq: Every day | ORAL | 4 refills | Status: DC

## 2017-05-29 MED ORDER — LEVOTHYROXINE SODIUM 112 MCG PO TABS: 112.0000 ug | ORAL_TABLET | Freq: Every day | ORAL | 12 refills | Status: DC

## 2017-05-29 MED ORDER — BENAZEPRIL HCL 20 MG PO TABS: 20.0000 mg | ORAL_TABLET | Freq: Every day | ORAL | 12 refills | Status: DC

## 2017-05-29 MED ORDER — ATORVASTATIN CALCIUM 20 MG PO TABS: 20.0000 mg | ORAL_TABLET | Freq: Every day | ORAL | 12 refills | Status: DC

## 2017-05-29 MED ORDER — LEVOTHYROXINE SODIUM 112 MCG PO TABS: 112.0000 ug | ORAL_TABLET | Freq: Every day | ORAL | 4 refills | Status: DC

## 2017-05-29 MED ORDER — BENAZEPRIL HCL 20 MG PO TABS: 20.0000 mg | ORAL_TABLET | Freq: Every day | ORAL | 4 refills | Status: DC

## 2017-05-29 NOTE — Assessment & Plan Note (Signed)
The current medical regimen is effective;  continue present plan and medications.  

## 2017-05-29 NOTE — Progress Notes (Signed)
BP 125/78   Pulse 80   Ht 5\' 5"  (1.651 m)   Wt 179 lb (81.2 kg)   BMI 29.79 kg/m    Subjective:    Patient ID: Bailey Ayala, female    DOB: 1940-06-07, 77 y.o.   MRN: [id number]  HPI: Bailey Ayala is a 77 y.o. female  Chief Complaint  Patient presents with  . Follow-up   Annual check  Patient follow-up doing well with hypothyroid takes Synthroid without any issues or concerns. Hypercholesterol also doing well with medications no concerns. Hypertension stable with good control no issues with medications.  On chart review patient's had CKD which is been stable for years. On further review with patient started with taking too much ibuprofen which was for her painful shoulder which was repaired and does not take the ibuprofen anymore.  Relevant past medical, surgical, family and social history reviewed and updated as indicated. Interim medical history since our last visit reviewed. Allergies and medications reviewed and updated.  Review of Systems  Constitutional: Negative.   HENT: Negative.   Eyes: Negative.   Respiratory: Negative.   Cardiovascular: Negative.   Gastrointestinal: Negative.   Endocrine: Negative.   Genitourinary: Negative.   Musculoskeletal: Negative.   Skin: Negative.   Allergic/Immunologic: Negative.   Neurological: Negative.   Hematological: Negative.   Psychiatric/Behavioral: Negative.     Per HPI unless specifically indicated above     Objective:    BP 125/78   Pulse 80   Ht 5\' 5"  (1.651 m)   Wt 179 lb (81.2 kg)   BMI 29.79 kg/m   Wt Readings from Last 3 Encounters:  05/29/17 179 lb (81.2 kg)  02/06/17 182 lb (82.6 kg)  11/16/16 185 lb 3.2 oz (84 kg)    Physical Exam  Constitutional: She is oriented to person, place, and time. She appears well-developed and well-nourished.  HENT:  Head: Normocephalic and atraumatic.  Right Ear: External ear normal.  Left Ear: External ear normal.  Nose: Nose normal.  Mouth/Throat:  Oropharynx is clear and moist.  Eyes: Conjunctivae and EOM are normal. Pupils are equal, round, and reactive to light.  Neck: Normal range of motion. Neck supple. Carotid bruit is not present.  Cardiovascular: Normal rate, regular rhythm and normal heart sounds.  No murmur heard. Pulmonary/Chest: Effort normal and breath sounds normal. She exhibits no mass. Right breast exhibits no mass, no skin change and no tenderness. Left breast exhibits no mass, no skin change and no tenderness. Breasts are symmetrical.  Abdominal: Soft. Bowel sounds are normal. There is no hepatosplenomegaly.  Musculoskeletal: Normal range of motion.  Neurological: She is alert and oriented to person, place, and time.  Skin: No rash noted.  Psychiatric: She has a normal mood and affect. Her behavior is normal. Judgment and thought content normal.    Results for orders placed or performed during the hospital encounter of 38/18/29  Basic metabolic panel  Result Value Ref Range   Sodium 136 135 - 145 mmol/L   Potassium 4.0 3.5 - 5.1 mmol/L   Chloride 105 101 - 111 mmol/L   CO2 21 (L) 22 - 32 mmol/L   Glucose, Bld 96 65 - 99 mg/dL   BUN 34 (H) 6 - 20 mg/dL   Creatinine, Ser 1.56 (H) 0.44 - 1.00 mg/dL   Calcium 9.8 8.9 - 10.3 mg/dL   GFR calc non Af Amer 31 (L) >60 mL/min   GFR calc Af Amer 36 (L) >60 mL/min  Anion gap 10 5 - 15  Glucose, capillary  Result Value Ref Range   Glucose-Capillary 106 (H) 65 - 99 mg/dL      Assessment & Plan:   Problem List Items Addressed This Visit      Cardiovascular and Mediastinum   Peripheral arterial occlusive disease ([place])    The current medical regimen is effective;  continue present plan and medications.       Relevant Medications   atorvastatin (LIPITOR) 20 MG tablet   benazepril (LOTENSIN) 20 MG tablet   Other Relevant Orders   Comprehensive metabolic panel   CBC with Differential/Platelet   Urinalysis, Routine w reflex microscopic   Hypertension   Relevant  Medications   atorvastatin (LIPITOR) 20 MG tablet   benazepril (LOTENSIN) 20 MG tablet   Other Relevant Orders   Comprehensive metabolic panel   CBC with Differential/Platelet   Urinalysis, Routine w reflex microscopic   Benign hypertensive heart and CKD, stage 3 (GFR 30-59), w CHF ([place])    Discuss CKD patient's had stable renal function for the last several years after stopping ibuprofen when having shoulder surgery.      Relevant Medications   atorvastatin (LIPITOR) 20 MG tablet   benazepril (LOTENSIN) 20 MG tablet   Other Relevant Orders   Ambulatory referral to Nephrology   Comprehensive metabolic panel   CBC with Differential/Platelet   Urinalysis, Routine w reflex microscopic     Endocrine   Hypothyroid    The current medical regimen is effective;  continue present plan and medications.       Relevant Medications   levothyroxine (SYNTHROID, LEVOTHROID) 112 MCG tablet   Other Relevant Orders   TSH     Other   Hyperlipidemia - Primary    The current medical regimen is effective;  continue present plan and medications.       Relevant Medications   atorvastatin (LIPITOR) 20 MG tablet   benazepril (LOTENSIN) 20 MG tablet   Other Relevant Orders   Basic metabolic panel   LP+ALT+AST [place], Waived   Comprehensive metabolic panel   CBC with Differential/Platelet   Urinalysis, Routine w reflex microscopic       Follow up plan: Return in about 6 months (around 11/26/2017) for BMP,  Lipids, ALT, AST.

## 2017-05-29 NOTE — Assessment & Plan Note (Signed)
Discuss CKD patient's had stable renal function for the last several years after stopping ibuprofen when having shoulder surgery.

## 2017-05-30 ENCOUNTER — Telehealth: Payer: Self-pay | Admitting: Family Medicine

## 2017-05-30 DIAGNOSIS — E039 Hypothyroidism, unspecified: Secondary | ICD-10-CM

## 2017-05-30 LAB — CBC WITH DIFFERENTIAL/PLATELET
Basophils Absolute: 0 10*3/uL (ref 0.0–0.2)
Basos: 0 %
EOS (ABSOLUTE): 0.2 10*3/uL (ref 0.0–0.4)
Eos: 5 %
Hematocrit: 32 % — ABNORMAL LOW (ref 34.0–46.6)
Hemoglobin: 10.5 g/dL — ABNORMAL LOW (ref 11.1–15.9)
Immature Grans (Abs): 0 10*3/uL (ref 0.0–0.1)
Immature Granulocytes: 0 %
Lymphocytes Absolute: 1.5 10*3/uL (ref 0.7–3.1)
Lymphs: 29 %
MCH: 31.9 pg (ref 26.6–33.0)
MCHC: 32.8 g/dL (ref 31.5–35.7)
MCV: 97 fL (ref 79–97)
Monocytes Absolute: 0.4 10*3/uL (ref 0.1–0.9)
Monocytes: 8 %
Neutrophils Absolute: 3 10*3/uL (ref 1.4–7.0)
Neutrophils: 58 %
Platelets: 237 10*3/uL (ref 150–379)
RBC: 3.29 x10E6/uL — ABNORMAL LOW (ref 3.77–5.28)
RDW: 14.1 % (ref 12.3–15.4)
WBC: 5.2 10*3/uL (ref 3.4–10.8)

## 2017-05-30 LAB — BASIC METABOLIC PANEL
BUN/Creatinine Ratio: 17 (ref 12–28)
BUN: 22 mg/dL (ref 8–27)
CO2: 21 mmol/L (ref 20–29)
Calcium: 9.2 mg/dL (ref 8.7–10.3)
Chloride: 103 mmol/L (ref 96–106)
Creatinine, Ser: 1.28 mg/dL — ABNORMAL HIGH (ref 0.57–1.00)
GFR calc Af Amer: 47 mL/min/{1.73_m2} — ABNORMAL LOW (ref >59)
GFR calc non Af Amer: 41 mL/min/{1.73_m2} — ABNORMAL LOW (ref >59)
Glucose: 101 mg/dL — ABNORMAL HIGH (ref 65–99)
Potassium: 4.7 mmol/L (ref 3.5–5.2)
Sodium: 138 mmol/L (ref 134–144)

## 2017-05-30 LAB — COMPREHENSIVE METABOLIC PANEL
ALT: 15 IU/L (ref 0–32)
AST: 20 IU/L (ref 0–40)
Albumin/Globulin Ratio: 1.3 (ref 1.2–2.2)
Albumin: 4.3 g/dL (ref 3.5–4.8)
Alkaline Phosphatase: 89 IU/L (ref 39–117)
BUN/Creatinine Ratio: 16 (ref 12–28)
BUN: 21 mg/dL (ref 8–27)
Bilirubin Total: 0.3 mg/dL (ref 0.0–1.2)
CO2: 22 mmol/L (ref 20–29)
Calcium: 9.4 mg/dL (ref 8.7–10.3)
Chloride: 103 mmol/L (ref 96–106)
Creatinine, Ser: 1.33 mg/dL — ABNORMAL HIGH (ref 0.57–1.00)
GFR calc Af Amer: 45 mL/min/{1.73_m2} — ABNORMAL LOW (ref >59)
GFR calc non Af Amer: 39 mL/min/{1.73_m2} — ABNORMAL LOW (ref >59)
Globulin, Total: 3.2 g/dL (ref 1.5–4.5)
Glucose: 89 mg/dL (ref 65–99)
Potassium: 4.9 mmol/L (ref 3.5–5.2)
Sodium: 140 mmol/L (ref 134–144)
Total Protein: 7.5 g/dL (ref 6.0–8.5)

## 2017-05-30 LAB — TSH: TSH: 0.273 u[IU]/mL — ABNORMAL LOW (ref 0.450–4.500)

## 2017-05-30 NOTE — Telephone Encounter (Signed)
Phone call Discussed TSH slightly suppressed patient taking her thyroid pills faithfully with no change will recheck TSH 1 month to assess if medication needs to be adjusted.

## 2017-06-06 ENCOUNTER — Ambulatory Visit (INDEPENDENT_AMBULATORY_CARE_PROVIDER_SITE_OTHER): Payer: Medicare Other

## 2017-06-06 DIAGNOSIS — Z23 Encounter for immunization: Secondary | ICD-10-CM | POA: Diagnosis not present

## 2017-06-21 DIAGNOSIS — N183 Chronic kidney disease, stage 3 (moderate): Secondary | ICD-10-CM | POA: Diagnosis not present

## 2017-06-21 DIAGNOSIS — D631 Anemia in chronic kidney disease: Secondary | ICD-10-CM | POA: Diagnosis not present

## 2017-06-21 DIAGNOSIS — I10 Essential (primary) hypertension: Secondary | ICD-10-CM | POA: Diagnosis not present

## 2017-06-22 DIAGNOSIS — D631 Anemia in chronic kidney disease: Secondary | ICD-10-CM | POA: Diagnosis not present

## 2017-06-22 DIAGNOSIS — N183 Chronic kidney disease, stage 3 (moderate): Secondary | ICD-10-CM | POA: Diagnosis not present

## 2017-06-22 DIAGNOSIS — I10 Essential (primary) hypertension: Secondary | ICD-10-CM | POA: Diagnosis not present

## 2017-06-27 DIAGNOSIS — N183 Chronic kidney disease, stage 3 (moderate): Secondary | ICD-10-CM | POA: Diagnosis not present

## 2017-06-30 ENCOUNTER — Inpatient Hospital Stay: Payer: Medicare Other | Attending: Hematology and Oncology | Admitting: Hematology and Oncology

## 2017-06-30 ENCOUNTER — Encounter: Payer: Self-pay | Admitting: Hematology and Oncology

## 2017-06-30 ENCOUNTER — Inpatient Hospital Stay: Payer: Medicare Other

## 2017-06-30 ENCOUNTER — Ambulatory Visit
Admission: RE | Admit: 2017-06-30 | Discharge: 2017-06-30 | Disposition: A | Discharge status: Home / Self Care | Payer: Medicare Other | Source: Ambulatory Visit | Attending: Urgent Care | Admitting: Urgent Care

## 2017-06-30 ENCOUNTER — Ambulatory Visit
Admission: RE | Admit: 2017-06-30 | Discharge: 2017-06-30 | Disposition: A | Discharge status: Home / Self Care | Payer: Medicare Other | Source: Ambulatory Visit | Attending: Hematology and Oncology | Admitting: Hematology and Oncology

## 2017-06-30 VITALS — BP 145/95 | HR 86 | Temp 96.6°F | Resp 20 | Ht 65.0 in | Wt 171.4 lb

## 2017-06-30 DIAGNOSIS — E785 Hyperlipidemia, unspecified: Secondary | ICD-10-CM

## 2017-06-30 DIAGNOSIS — J449 Chronic obstructive pulmonary disease, unspecified: Secondary | ICD-10-CM

## 2017-06-30 DIAGNOSIS — Z87891 Personal history of nicotine dependence: Secondary | ICD-10-CM | POA: Diagnosis not present

## 2017-06-30 DIAGNOSIS — D631 Anemia in chronic kidney disease: Secondary | ICD-10-CM

## 2017-06-30 DIAGNOSIS — Z7982 Long term (current) use of aspirin: Secondary | ICD-10-CM

## 2017-06-30 DIAGNOSIS — D539 Nutritional anemia, unspecified: Secondary | ICD-10-CM | POA: Insufficient documentation

## 2017-06-30 DIAGNOSIS — D472 Monoclonal gammopathy: Secondary | ICD-10-CM | POA: Insufficient documentation

## 2017-06-30 DIAGNOSIS — G6281 Critical illness polyneuropathy: Secondary | ICD-10-CM

## 2017-06-30 DIAGNOSIS — R195 Other fecal abnormalities: Secondary | ICD-10-CM | POA: Insufficient documentation

## 2017-06-30 DIAGNOSIS — G2581 Restless legs syndrome: Secondary | ICD-10-CM | POA: Diagnosis not present

## 2017-06-30 DIAGNOSIS — R634 Abnormal weight loss: Secondary | ICD-10-CM | POA: Diagnosis not present

## 2017-06-30 DIAGNOSIS — M1990 Unspecified osteoarthritis, unspecified site: Secondary | ICD-10-CM

## 2017-06-30 DIAGNOSIS — Z79899 Other long term (current) drug therapy: Secondary | ICD-10-CM | POA: Diagnosis not present

## 2017-06-30 DIAGNOSIS — R1110 Vomiting, unspecified: Secondary | ICD-10-CM | POA: Diagnosis not present

## 2017-06-30 DIAGNOSIS — E1122 Type 2 diabetes mellitus with diabetic chronic kidney disease: Secondary | ICD-10-CM

## 2017-06-30 DIAGNOSIS — E039 Hypothyroidism, unspecified: Secondary | ICD-10-CM | POA: Diagnosis not present

## 2017-06-30 DIAGNOSIS — R5382 Chronic fatigue, unspecified: Secondary | ICD-10-CM

## 2017-06-30 DIAGNOSIS — R630 Anorexia: Secondary | ICD-10-CM

## 2017-06-30 DIAGNOSIS — Z8673 Personal history of transient ischemic attack (TIA), and cerebral infarction without residual deficits: Secondary | ICD-10-CM

## 2017-06-30 DIAGNOSIS — D649 Anemia, unspecified: Secondary | ICD-10-CM

## 2017-06-30 DIAGNOSIS — N183 Chronic kidney disease, stage 3 (moderate): Secondary | ICD-10-CM

## 2017-06-30 DIAGNOSIS — C9000 Multiple myeloma not having achieved remission: Secondary | ICD-10-CM | POA: Insufficient documentation

## 2017-06-30 DIAGNOSIS — I129 Hypertensive chronic kidney disease with stage 1 through stage 4 chronic kidney disease, or unspecified chronic kidney disease: Secondary | ICD-10-CM | POA: Diagnosis not present

## 2017-06-30 LAB — CBC WITH DIFFERENTIAL/PLATELET
Basophils Absolute: 0 10*3/uL (ref 0–0.1)
Basophils Relative: 0 %
Eosinophils Absolute: 0.4 10*3/uL (ref 0–0.7)
Eosinophils Relative: 7 %
HCT: 31.1 % — ABNORMAL LOW (ref 35.0–47.0)
Hemoglobin: 10.4 g/dL — ABNORMAL LOW (ref 12.0–16.0)
Lymphocytes Relative: 26 %
Lymphs Abs: 1.5 10*3/uL (ref 1.0–3.6)
MCH: 32.6 pg (ref 26.0–34.0)
MCHC: 33.5 g/dL (ref 32.0–36.0)
MCV: 97.4 fL (ref 80.0–100.0)
Monocytes Absolute: 0.4 10*3/uL (ref 0.2–0.9)
Monocytes Relative: 7 %
Neutro Abs: 3.3 10*3/uL (ref 1.4–6.5)
Neutrophils Relative %: 60 %
Platelets: 227 10*3/uL (ref 150–440)
RBC: 3.19 MIL/uL — ABNORMAL LOW (ref 3.80–5.20)
RDW: 13.6 % (ref 11.5–14.5)
WBC: 5.7 10*3/uL (ref 3.6–11.0)

## 2017-06-30 LAB — RETICULOCYTES
RBC.: 3.19 MIL/uL — ABNORMAL LOW (ref 3.80–5.20)
Retic Count, Absolute: 51 10*3/uL (ref 19.0–183.0)
Retic Ct Pct: 1.6 % (ref 0.4–3.1)

## 2017-06-30 LAB — TSH: TSH: 0.204 u[IU]/mL — ABNORMAL LOW (ref 0.350–4.500)

## 2017-06-30 LAB — FERRITIN: Ferritin: 75 ng/mL (ref 11–307)

## 2017-06-30 LAB — FOLATE: Folate: 58 ng/mL (ref >5.9)

## 2017-06-30 LAB — VITAMIN B12: Vitamin B-12: 951 pg/mL — ABNORMAL HIGH (ref 180–914)

## 2017-06-30 NOTE — Progress Notes (Addendum)
[facility] day:  06/30/2017  Chief Complaint: Bailey Ayala is a 77 y.o. female with an abnormal SPEP who is referred in consultation by Dr. Holley Raring for assessment and management.  HPI:  The patient was referred to Dr. Holley Raring on 06/21/2017 for evaluation of chronic kidney disease stage III.  Risk factors for CKD included age, hypertension, and prior use of NSAIDs. Patient notes that her renal function has decreased over the course of the last six months.  She underwent a work-up by Dr Holley Raring.  CBC revealed a hematocrit of 32.1, hemoglobin 10.6, MCV 98, platelets 232,000, WBC 6100 with an ANC of 3800.  Differential was unremarkable.  Iron saturation was 23% with a TIBC of 245.  ANA was negative.  Creatinine was 1.47 (CrCl 34-40 ml/min).  Calcium was 9.8, protein 7.8, and albumen 4.4. LFTs were normal.  SPEP revealed a 1.2 gm/dL monoclonal spike.  Random urine revealed 4.6 mg/dL protein and a 40.6% M-spike.  Symptomatically, She feels "tired". She notes chronic fatigue for the last year. She has had progressive weight loss. She has lost 30 pounds over the past 2 years. Patient notes a poor appetite. She states, "I eat when I get hungry, which is not very often". Patient eats "very little meat". She does not eat green leafy vegetables on a regular basis. She states, "food just doesn't taste good". Patient denies ice pica. She has experienced restless legs for the last 2 years.   She has been experiencing a non-productive cough and runny nose for "a long time". Patient has experienced transient episodes of vomiting over the last 6 months. Patient denies bleeding; no hematochezia, melena, or gross hematuria. Patient unable to advise when she had her last colonoscopy.  She states, "I am never having another one". Mammogram is up to date. Patient denies B symptoms or recurrent infections.   Patient has a history of osteoarthritis. She has had knee replacements.     Past Medical History:  Diagnosis Date  . Arthritis   . Blood dyscrasia    BLEEDS EASILY   . COPD (chronic obstructive pulmonary disease) ([place])   . Diabetes mellitus without complication ([place])   . Diffuse cystic mastopathy   . Hyperlipidemia   . Hypertension   . Hypothyroidism   . L4-L5 disc bulge   . Osteoporosis, post-menopausal   . PAD (peripheral artery disease) ([place])   . Shortness of breath dyspnea    WITH EXERTION   . Stroke ([place])    TIA     2016     WAS FOUND ON SCAN ORDERED FROM [facility]UR[facility]  . Thyroid disease   . TIA (transient ischemic attack) 2016   left hand weakness    Past Surgical History:  Procedure Laterality Date  . ABDOMINAL HYSTERECTOMY  1990  . BACK SURGERY  2011  . BREAST EXCISIONAL BIOPSY Left    2010?   Marland Kitchen CATARACT EXTRACTION, BILATERAL    . COLONOSCOPY  1998  . EYE SURGERY Bilateral 05/06/14  . JOINT REPLACEMENT     LT KNEE  . KNEE SURGERY Left 2012  . OOPHORECTOMY  1990  . parathyroid transplant     2/3 THYROID REMOVED   ([facility]ITER)  . SPINE SURGERY    . THYROID SURGERY    . TONSILLECTOMY    . TOTAL SHOULDER ARTHROPLASTY Left 01/14/2016   Procedure: LEFT TOTAL SHOULDER ARTHROPLASTY;  Surgeon: Justice Britain, MD;  Location: [facility];  Service: Orthopedics;  Laterality: Left;  .  TOTAL SHOULDER ARTHROPLASTY Right 11/03/2016   Procedure: RIGHT TOTAL SHOULDER ARTHROPLASTY;  Surgeon: Justice Britain, MD;  Location: [facility];  Service: Orthopedics;  Laterality: Right;  . ULNAR NERVE REPAIR     LEFT  . vein closure procedure Left 2010    Family History  Problem Relation Age of Onset  . Heart disease Mother        MI  . Arthritis Mother   . Stroke Mother   . Hyperlipidemia Mother   . Heart disease Father        MI  . Hyperlipidemia Father   . Diabetes Sister   . Hyperlipidemia Brother     Social History:  reports that she quit smoking about 27 years ago. She has a 75.00 pack-year smoking history. she has never used smokeless tobacco. She reports that she  drinks about 1.2 oz of alcohol per week. She reports that she does not use drugs.  Patient is a former 3 pack per day smoker for 25 years (75 pack year). Patient lives in [place]. She is a former Electrical engineer. Patient denies known exposures to radiation on toxins. The patient is alone today.  Allergies:  Allergies  Allergen Reactions  . Codeine Other (See Comments)    hallucinations    . Simvastatin Diarrhea and Nausea Only    Current Medications: Current Outpatient Medications  Medication Sig Dispense Refill  . aspirin EC 81 MG tablet Take 1 tablet (81 mg total) by mouth daily. 90 tablet 3  . atorvastatin (LIPITOR) 20 MG tablet Take 1 tablet (20 mg total) by mouth daily. 90 tablet 4  . benazepril (LOTENSIN) 20 MG tablet Take 1 tablet (20 mg total) by mouth daily. 90 tablet 4  . ibuprofen (ADVIL,MOTRIN) 200 MG tablet Take 400 mg by mouth every 8 (eight) hours as needed for mild pain.    Marland Kitchen levothyroxine (SYNTHROID, LEVOTHROID) 112 MCG tablet Take 1 tablet (112 mcg total) by mouth daily. 90 tablet 4  . Multiple Vitamins-Minerals (CENTRUM SILVER ULTRA WOMENS PO) Take 1 tablet by mouth daily.      No current facility-administered medications for this visit.     Review of Systems:  GENERAL:  Fatigue > 1 year.  No fevers or sweats. 30 pound weight loss over the last 2 years.  PERFORMANCE STATUS (ECOG):  1 HEENT:  Runny nose. Cataract surgery 2-3 years ago.  Change in vision.  No sore throat, mouth sores or tenderness. Lungs: Chronic cough, non-productive.  COPD. No shortness of breath.  No hemoptysis. Cardiac:  No chest pain, palpitations, orthopnea, or PND. GI:  Transient vomiting episodes. No appetite.  No nausea,diarrhea, constipation, melena or hematochezia.  Colonoscopy, long time ago (refuses further). GU:  No urgency, frequency, dysuria, or hematuria. Musculoskeletal:  Osteoarthritis s/p 2 shoulder and 1 knee replacement.  No back pain.  No muscle tenderness. Extremities:  No pain  or swelling. Skin:  Dry itchy skin.  No rashes. Neuro:  Restless legs.  No headache, numbness or weakness, balance or coordination issues. Endocrine:  No diabetes.  Thyroid disease on Synthroid.  No hot flashes or night sweats. Psych:  No mood changes, depression or anxiety. Pain:  No focal pain. Review of systems:  All other systems reviewed and found to be negative.  Physical Exam: Blood pressure (!) 145/95, pulse 86, temperature (!) 96.6 F (35.9 C), temperature source Tympanic, resp. rate 20, height '5\' 5"'  (1.651 m), weight 171 lb 6 oz (77.7 kg). GENERAL:  Well developed, well nourished, woman  sitting comfortably in the exam room in no acute distress.  She appears older than stated age. MENTAL STATUS:  Alert and oriented to person, place and time. HEAD:  Long brown hair.  Normocephalic, atraumatic, face symmetric, no Cushingoid features. EYES:  Blue eyes.  Pupils equal round and reactive to light and accomodation.  No conjunctivitis or scleral icterus. ENT:  Oropharynx clear without lesion.  Tongue normal.  Upper dentures.  Mucous membranes moist.  RESPIRATORY:  Clear to auscultation without rales, wheezes or rhonchi. CARDIOVASCULAR:  Regular rate and rhythm without murmur, rub or gallop. ABDOMEN:  Soft, non-tender, with active bowel sounds, and no hepatosplenomegaly.  No masses. SKIN:  No rashes, ulcers or lesions. EXTREMITIES: Arthritic changes DIP second digit, bilaterally.  No edema, no skin discoloration or tenderness.  No palpable cords. LYMPH NODES: No palpable cervical, supraclavicular, axillary or inguinal adenopathy  NEUROLOGICAL: Unremarkable. PSYCH:  Appropriate.   No visits with results within 3 Day(s) from this visit.  Latest known visit with results is:  Office Visit on 05/29/2017  Component Date Value Ref Range Status  . Glucose 05/29/2017 101* 65 - 99 mg/dL Final  . BUN 05/29/2017 22  8 - 27 mg/dL Final  . Creatinine, Ser 05/29/2017 1.28* 0.57 - 1.00 mg/dL Final   . GFR calc non Af Amer 05/29/2017 41* >59 mL/min/1.73 Final  . GFR calc Af Amer 05/29/2017 47* >59 mL/min/1.73 Final  . BUN/Creatinine Ratio 05/29/2017 17  12 - 28 Final  . Sodium 05/29/2017 138  134 - 144 mmol/L Final  . Potassium 05/29/2017 4.7  3.5 - 5.2 mmol/L Final  . Chloride 05/29/2017 103  96 - 106 mmol/L Final  . CO2 05/29/2017 21  20 - 29 mmol/L Final  . Calcium 05/29/2017 9.2  8.7 - 10.3 mg/dL Final  . ALT (SGPT) Piccolo, Waived 05/29/2017 23  10 - 47 U/L Final  . AST (SGOT) Piccolo, Waived 05/29/2017 28  11 - 38 U/L Final  . Cholesterol Piccolo, Waived 05/29/2017 144  <200 mg/dL Final   Comment:                         Desirable                <200                         Borderline High      200- 239                         High                     >239   . HDL Chol Piccolo, Waived 05/29/2017 47* >59 mg/dL Final   Comment:                         Low HDL- Risk Factor     < 40                         High HDL- Negative       > 59                          Risk Factor (Desirable)   . Triglycerides Piccolo,Waived 05/29/2017 169* <150 mg/dL Final   Comment:  Normal                   <150                         Borderline High     150 - 199                         High                200 - 499                         Very High                >499   . Chol/HDL Ratio Piccolo,Waive 05/29/2017 3.1  mg/dL Final   Comment:                                        Female      Female                         Low Risk      < 5.0      < 4.5                         High Risk     > 4.9      > 4.4   . LDL Chol Calc Piccolo Waived 05/29/2017 63  <100 mg/dL Final   Comment:                         Optimal                  <100                         Near Optimal        100 - 129                         Borderline High     130 - 159                         High                160 - 189                         Very High                >189   . VLDL Chol Calc  Piccolo,Waive 05/29/2017 34* <30 mg/dL Final   Comment:                         Normal                   < 30                         High                     > 29   .  Glucose 05/29/2017 89  65 - 99 mg/dL Final  . BUN 05/29/2017 21  8 - 27 mg/dL Final  . Creatinine, Ser 05/29/2017 1.33* 0.57 - 1.00 mg/dL Final  . GFR calc non Af Amer 05/29/2017 39* >59 mL/min/1.73 Final  . GFR calc Af Amer 05/29/2017 45* >59 mL/min/1.73 Final  . BUN/Creatinine Ratio 05/29/2017 16  12 - 28 Final  . Sodium 05/29/2017 140  134 - 144 mmol/L Final  . Potassium 05/29/2017 4.9  3.5 - 5.2 mmol/L Final  . Chloride 05/29/2017 103  96 - 106 mmol/L Final  . CO2 05/29/2017 22  20 - 29 mmol/L Final  . Calcium 05/29/2017 9.4  8.7 - 10.3 mg/dL Final  . Total Protein 05/29/2017 7.5  6.0 - 8.5 g/dL Final  . Albumin 05/29/2017 4.3  3.5 - 4.8 g/dL Final  . Globulin, Total 05/29/2017 3.2  1.5 - 4.5 g/dL Final  . Albumin/Globulin Ratio 05/29/2017 1.3  1.2 - 2.2 Final  . Bilirubin Total 05/29/2017 0.3  0.0 - 1.2 mg/dL Final  . Alkaline Phosphatase 05/29/2017 89  39 - 117 IU/L Final  . AST 05/29/2017 20  0 - 40 IU/L Final  . ALT 05/29/2017 15  0 - 32 IU/L Final  . WBC 05/29/2017 5.2  3.4 - 10.8 x10E3/uL Final  . RBC 05/29/2017 3.29* 3.77 - 5.28 x10E6/uL Final  . Hemoglobin 05/29/2017 10.5* 11.1 - 15.9 g/dL Final  . Hematocrit 05/29/2017 32.0* 34.0 - 46.6 % Final  . MCV 05/29/2017 97  79 - 97 fL Final  . MCH 05/29/2017 31.9  26.6 - 33.0 pg Final  . MCHC 05/29/2017 32.8  31.5 - 35.7 g/dL Final  . RDW 05/29/2017 14.1  12.3 - 15.4 % Final  . Platelets 05/29/2017 237  150 - 379 x10E3/uL Final  . Neutrophils 05/29/2017 58  Not Estab. % Final  . Lymphs 05/29/2017 29  Not Estab. % Final  . Monocytes 05/29/2017 8  Not Estab. % Final  . Eos 05/29/2017 5  Not Estab. % Final  . Basos 05/29/2017 0  Not Estab. % Final  . Neutrophils Absolute 05/29/2017 3.0  1.4 - 7.0 x10E3/uL Final  . Lymphocytes Absolute 05/29/2017 1.5  0.7 - 3.1  x10E3/uL Final  . Monocytes Absolute 05/29/2017 0.4  0.1 - 0.9 x10E3/uL Final  . EOS (ABSOLUTE) 05/29/2017 0.2  0.0 - 0.4 x10E3/uL Final  . Basophils Absolute 05/29/2017 0.0  0.0 - 0.2 x10E3/uL Final  . Immature Granulocytes 05/29/2017 0  Not Estab. % Final  . Immature Grans (Abs) 05/29/2017 0.0  0.0 - 0.1 x10E3/uL Final  . TSH 05/29/2017 0.273* 0.450 - 4.500 uIU/mL Final  . Specific Gravity, UA 05/29/2017 1.015  1.005 - 1.030 Final  . pH, UA 05/29/2017 5.5  5.0 - 7.5 Final  . Color, UA 05/29/2017 Straw  Yellow Final  . Appearance Ur 05/29/2017 Hazy* Clear Final  . Leukocytes, UA 05/29/2017 Trace* Negative Final  . Protein, UA 05/29/2017 Negative  Negative/Trace Final  . Glucose, UA 05/29/2017 Negative  Negative Final  . Ketones, UA 05/29/2017 Negative  Negative Final  . RBC, UA 05/29/2017 Negative  Negative Final  . Bilirubin, UA 05/29/2017 Negative  Negative Final  . Urobilinogen, Ur 05/29/2017 0.2  0.2 - 1.0 mg/dL Final  . Nitrite, UA 05/29/2017 Negative  Negative Final  . Microscopic Examination 05/29/2017 See below:   Final  . WBC, UA 05/29/2017 0-5  0 - 5 /hpf Final  . RBC, UA 05/29/2017 0-2  0 - 2 /hpf  Final  . Epithelial Cells (non renal) 05/29/2017 0-10  0 - 10 /hpf Final  . Renal Epithel, UA 05/29/2017 0-10* None seen /hpf Final  . Bacteria, UA 05/29/2017 None seen  None seen/Few Final    Assessment:  AYLENE ACOFF is a 77 y.o. female with a monoclonal gammopathy. SPEP on 06/21/2017 revealed a 1.2 gm/dL monoclonal spike.  Random urine revealed 4.6 mg/dL protein and a 40.6% M-spike.  Calcium, albumen, and serum protein were normal.  She has stage III chronic kidney disease (Cr 1.47; CrCl 34-40 ml/min).     She has a normocytic anemia.  Iron studies were normal on 06/21/2017.  Appetite is poor.  Last colonoscopy was > 10 years ago (she declines).  She has a 75 pack year smoking history.  She has COPD.  Symptomatically, she is fatigued. She has a chronic cough. She has  experienced some isolated vomiting episodes over the last 6 months. Appetite is poor. She has lost 30 pounds over the last 2 years.  Exam is unremarkable.  Plan: 1.  Discuss monoclonal gammopathy and evaluation.  Differential includes monoclonal gammopathy of unknown significance (MGUS) versus multiple myeloma.  Discuss CRAB criteria. 2.  Labs today:  CBC with diff, retic, ferritin, B12, folate, TSH, myeloma panel, FLCA, beta2-microglobulin. 3.  24 hour urine for UPEP and free light chains. 4.  Schedule bone survey. 5.  Discuss low dose chest CT cancer screening program. Patient is a former smoker with a > 50 pack year smoking history. Patient agrees to referral.  6.  RTC on 07/10/2017 for MD review of work-up and discussion regarding direction of therapy.   Honor Loh, NP  06/30/2017, 2:57 PM   I saw and evaluated the patient, participating in the key portions of the service and reviewing pertinent diagnostic studies and records.  I reviewed the nurse practitioner's note and agree with the findings and the plan.  The assessment and plan were discussed with the patient.  Several questions were asked by the patient and answered.   Nolon Stalls, MD 06/30/2017, 2:57 PM

## 2017-06-30 NOTE — Progress Notes (Signed)
Patient here today as new evaluation regarding abnormal SPEP and UPEP.   Referred by Dr. Holley Raring.  Patient c/o lower abdominal pain that started about 2 weeks ago.  Appetite is 25% of her normal.  Not sleeping well. Patient hard of hearing.

## 2017-07-01 ENCOUNTER — Encounter: Payer: Self-pay | Admitting: Hematology and Oncology

## 2017-07-01 DIAGNOSIS — R634 Abnormal weight loss: Secondary | ICD-10-CM | POA: Insufficient documentation

## 2017-07-01 LAB — BETA 2 MICROGLOBULIN, SERUM: Beta-2 Microglobulin: 4.7 mg/L — ABNORMAL HIGH (ref 0.6–2.4)

## 2017-07-03 ENCOUNTER — Telehealth: Payer: Self-pay | Admitting: Family Medicine

## 2017-07-03 DIAGNOSIS — E039 Hypothyroidism, unspecified: Secondary | ICD-10-CM

## 2017-07-03 LAB — KAPPA/LAMBDA LIGHT CHAINS
Kappa free light chain: 23.7 mg/L — ABNORMAL HIGH (ref 3.3–19.4)
Kappa, lambda light chain ratio: 0.17 — ABNORMAL LOW (ref 0.26–1.65)
Lambda free light chains: 142.2 mg/L — ABNORMAL HIGH (ref 5.7–26.3)

## 2017-07-03 MED ORDER — LEVOTHYROXINE SODIUM 100 MCG PO TABS: 100.0000 ug | ORAL_TABLET | Freq: Every day | ORAL | 3 refills | Status: DC

## 2017-07-03 NOTE — Telephone Encounter (Signed)
It was dropped to 15mcg- which is where she's supposed to be.

## 2017-07-03 NOTE — Telephone Encounter (Signed)
Called and spoke to [employer]. Questions answered.

## 2017-07-03 NOTE — Telephone Encounter (Signed)
Please advise. See note to pharmacy on RX. Causing confusion.

## 2017-07-03 NOTE — Telephone Encounter (Signed)
-----   Message from Karen Kitchens, NP sent at 07/03/2017  8:18 AM EDT ----- Dr. Jeananne Rama....  Please see recent TSH done at the cancer center. Result 0.204 (previously 0.273 x 1 month ago).  Respectfully, Honor Loh, MSN, APRN, FNP-C, CEN Oncology/Hematology Nurse Practitioner  [facility]\

## 2017-07-03 NOTE — Telephone Encounter (Signed)
[facility] pharmacy called to verify amount taken for the levothyroxine (SYNTHROID, LEVOTHROID) 100 MCG tablet prescription.  She was told that the doctor was going to drop down to 114mcg.Marland Kitchen

## 2017-07-03 NOTE — Telephone Encounter (Signed)
Message relayed to patient. Verbalized understanding and denied questions.   

## 2017-07-03 NOTE — Telephone Encounter (Signed)
Please let patient know that her thyroid is being slightly over treated. We'd like her to drop down to 176mcg and recheck her levels in 6 weeks. Thanks! Order in and new dose sent to her pharmacy.

## 2017-07-04 ENCOUNTER — Other Ambulatory Visit: Payer: Self-pay | Admitting: *Deleted

## 2017-07-04 DIAGNOSIS — R5382 Chronic fatigue, unspecified: Secondary | ICD-10-CM | POA: Diagnosis not present

## 2017-07-04 DIAGNOSIS — R630 Anorexia: Secondary | ICD-10-CM | POA: Diagnosis not present

## 2017-07-04 DIAGNOSIS — D472 Monoclonal gammopathy: Secondary | ICD-10-CM | POA: Diagnosis not present

## 2017-07-04 DIAGNOSIS — R195 Other fecal abnormalities: Secondary | ICD-10-CM | POA: Diagnosis not present

## 2017-07-04 DIAGNOSIS — R634 Abnormal weight loss: Secondary | ICD-10-CM | POA: Diagnosis not present

## 2017-07-04 DIAGNOSIS — R1110 Vomiting, unspecified: Secondary | ICD-10-CM | POA: Diagnosis not present

## 2017-07-04 DIAGNOSIS — D649 Anemia, unspecified: Secondary | ICD-10-CM

## 2017-07-04 LAB — MULTIPLE MYELOMA PANEL, SERUM
Albumin SerPl Elph-Mcnc: 4 g/dL (ref 2.9–4.4)
Albumin/Glob SerPl: 1.2 (ref 0.7–1.7)
Alpha 1: 0.2 g/dL (ref 0.0–0.4)
Alpha2 Glob SerPl Elph-Mcnc: 1 g/dL (ref 0.4–1.0)
B-Globulin SerPl Elph-Mcnc: 0.9 g/dL (ref 0.7–1.3)
Gamma Glob SerPl Elph-Mcnc: 1.3 g/dL (ref 0.4–1.8)
Globulin, Total: 3.5 g/dL (ref 2.2–3.9)
IgA: 48 mg/dL — ABNORMAL LOW (ref 64–422)
IgG (Immunoglobin G), Serum: 1660 mg/dL — ABNORMAL HIGH (ref 700–1600)
IgM (Immunoglobulin M), Srm: 29 mg/dL (ref 26–217)
M Protein SerPl Elph-Mcnc: 1.1 g/dL — ABNORMAL HIGH
Total Protein ELP: 7.5 g/dL (ref 6.0–8.5)

## 2017-07-05 ENCOUNTER — Telehealth: Payer: Self-pay | Admitting: *Deleted

## 2017-07-05 NOTE — Telephone Encounter (Signed)
Received referral for low dose lung cancer screening CT scan. Message left at phone number listed in EMR for patient to call me back to facilitate scheduling scan.  

## 2017-07-06 LAB — UPEP/UIFE/LIGHT CHAINS/TP, 24-HR UR
% BETA, Urine: 21 %
ALPHA 1 URINE: 5.5 %
Albumin, U: 8 %
Alpha 2, Urine: 11.5 %
Free Kappa Lt Chains,Ur: 28.8 mg/L — ABNORMAL HIGH (ref 1.35–24.19)
Free Kappa/Lambda Ratio: 0.45 — ABNORMAL LOW (ref 2.04–10.37)
Free Lambda Lt Chains,Ur: 63.8 mg/L — ABNORMAL HIGH (ref 0.24–6.66)
GAMMA GLOBULIN URINE: 54.1 %
M-SPIKE %, Urine: 27.4 % — ABNORMAL HIGH
M-Spike, Mg/24 Hr: 18 mg/24 hr — ABNORMAL HIGH
Total Protein, Urine-Ur/day: 64 mg/24 hr (ref 30–150)
Total Protein, Urine: 4 mg/dL

## 2017-07-10 ENCOUNTER — Encounter: Payer: Self-pay | Admitting: Hematology and Oncology

## 2017-07-10 ENCOUNTER — Inpatient Hospital Stay (HOSPITAL_BASED_OUTPATIENT_CLINIC_OR_DEPARTMENT_OTHER): Payer: Medicare Other | Admitting: Hematology and Oncology

## 2017-07-10 VITALS — BP 114/78 | HR 83 | Temp 97.7°F | Resp 18 | Ht 65.0 in | Wt 179.0 lb

## 2017-07-10 DIAGNOSIS — D472 Monoclonal gammopathy: Secondary | ICD-10-CM

## 2017-07-10 DIAGNOSIS — N183 Chronic kidney disease, stage 3 (moderate): Secondary | ICD-10-CM | POA: Diagnosis not present

## 2017-07-10 DIAGNOSIS — R195 Other fecal abnormalities: Secondary | ICD-10-CM

## 2017-07-10 DIAGNOSIS — E039 Hypothyroidism, unspecified: Secondary | ICD-10-CM

## 2017-07-10 DIAGNOSIS — E785 Hyperlipidemia, unspecified: Secondary | ICD-10-CM | POA: Diagnosis not present

## 2017-07-10 DIAGNOSIS — G2581 Restless legs syndrome: Secondary | ICD-10-CM

## 2017-07-10 DIAGNOSIS — J449 Chronic obstructive pulmonary disease, unspecified: Secondary | ICD-10-CM | POA: Diagnosis not present

## 2017-07-10 DIAGNOSIS — I129 Hypertensive chronic kidney disease with stage 1 through stage 4 chronic kidney disease, or unspecified chronic kidney disease: Secondary | ICD-10-CM | POA: Diagnosis not present

## 2017-07-10 DIAGNOSIS — Z79899 Other long term (current) drug therapy: Secondary | ICD-10-CM

## 2017-07-10 DIAGNOSIS — R110 Nausea: Secondary | ICD-10-CM | POA: Diagnosis not present

## 2017-07-10 DIAGNOSIS — D649 Anemia, unspecified: Secondary | ICD-10-CM

## 2017-07-10 DIAGNOSIS — R634 Abnormal weight loss: Secondary | ICD-10-CM | POA: Diagnosis not present

## 2017-07-10 DIAGNOSIS — R630 Anorexia: Secondary | ICD-10-CM

## 2017-07-10 DIAGNOSIS — D631 Anemia in chronic kidney disease: Secondary | ICD-10-CM | POA: Diagnosis not present

## 2017-07-10 DIAGNOSIS — R5382 Chronic fatigue, unspecified: Secondary | ICD-10-CM | POA: Diagnosis not present

## 2017-07-10 DIAGNOSIS — M1990 Unspecified osteoarthritis, unspecified site: Secondary | ICD-10-CM

## 2017-07-10 DIAGNOSIS — R1110 Vomiting, unspecified: Secondary | ICD-10-CM | POA: Diagnosis not present

## 2017-07-10 DIAGNOSIS — Z87891 Personal history of nicotine dependence: Secondary | ICD-10-CM

## 2017-07-10 DIAGNOSIS — Z8673 Personal history of transient ischemic attack (TIA), and cerebral infarction without residual deficits: Secondary | ICD-10-CM

## 2017-07-10 DIAGNOSIS — Z7982 Long term (current) use of aspirin: Secondary | ICD-10-CM

## 2017-07-10 NOTE — Progress Notes (Signed)
[facility] day:  07/10/2017  Chief Complaint: Bailey Ayala is a 77 y.o. female with an abnormal SPEP who is seen for review of work-up and discussion regarding direction of therapy.  HPI:  The patient was last seen in the medical oncology clinic on 06/30/2017 for initial consultation.  She had a monoclonal spike on work-up of stage III chronic kidney disease.  She had a normocytic anemia.  She underwent a work-up.  Labs revealed a hematocrit of 31.1, hemoglobin 10.4, MCV 97.4, platelets 227,000, WBC 5700 with an ANC of 3300.  SPEP revealed a 1.1 gm/dL IgG monoclonal protein with lambda light chain specificity and monoclonal free lambda light chains (Bence-Jones protein).  IgG was 1660 ([phone]) and IgA was 48 (64-422).  Kappa free light chains were 23.7, lambda free light chains were 142.2 and ratio 0.17 (0.26-1.65).  Beta2-microglobulin was 4.7 (0.6-2.4).  B12 was 951. TSH was 0.204 (low).  Ferritin was 75.  Folate was 58.  24 hour UPEP is pending.  Bone survey on 06/30/2017 revealed no suspicious focal bony lesions.  She was referred to the low-dose chest CT program secondary to her history of smoking.  During the interim, patient has been having "the runs" for years. Patient denies dysuria, frequency, or urgency, however patient notes malodorous urine. Patient denies any back pain or fevers.   Patient notes a recent change in her levothyroxine. She is now on 100 mcg daily. Patient continues to be fatigued. She denies any fevers or sweats. Patient eating well. Her weight is up 8 pounds since her last visit.  Patient denies pain in the clinic today.   Patient recently learned of more familial cancers; thryoid, breast, bladder, and skin.   Past Medical History:  Diagnosis Date  . Arthritis   . Blood dyscrasia    BLEEDS EASILY   . COPD (chronic obstructive pulmonary disease) ([place])   . Diabetes mellitus without complication ([place])   .  Diffuse cystic mastopathy   . Hyperlipidemia   . Hypertension   . Hypothyroidism   . L4-L5 disc bulge   . Osteoporosis, post-menopausal   . PAD (peripheral artery disease) ([place])   . Shortness of breath dyspnea    WITH EXERTION   . Stroke ([place])    TIA     2016     WAS FOUND ON SCAN ORDERED FROM NEUROL  . Thyroid disease   . TIA (transient ischemic attack) 2016   left hand weakness    Past Surgical History:  Procedure Laterality Date  . ABDOMINAL HYSTERECTOMY  1990  . BACK SURGERY  2011  . BREAST EXCISIONAL BIOPSY Left    2010?   Marland Kitchen CATARACT EXTRACTION, BILATERAL    . COLONOSCOPY  1998  . EYE SURGERY Bilateral 05/06/14  . JOINT REPLACEMENT     LT KNEE  . KNEE SURGERY Left 2012  . OOPHORECTOMY  1990  . parathyroid transplant     2/3 THYROID REMOVED   (GOITER)  . SPINE SURGERY    . THYROID SURGERY    . TONSILLECTOMY    . TOTAL SHOULDER ARTHROPLASTY Left 01/14/2016   Procedure: LEFT TOTAL SHOULDER ARTHROPLASTY;  Surgeon: Justice Britain, MD;  Location: [facility];  Service: Orthopedics;  Laterality: Left;  . TOTAL SHOULDER ARTHROPLASTY Right 11/03/2016   Procedure: RIGHT TOTAL SHOULDER ARTHROPLASTY;  Surgeon: Justice Britain, MD;  Location: [facility];  Service: Orthopedics;  Laterality: Right;  . ULNAR NERVE REPAIR     LEFT  .  vein closure procedure Left 2010    Family History  Problem Relation Age of Onset  . Heart disease Mother        MI  . Arthritis Mother   . Stroke Mother   . Hyperlipidemia Mother   . Heart disease Father        MI  . Hyperlipidemia Father   . Diabetes Sister   . Hyperlipidemia Brother     Social History:  reports that she quit smoking about 27 years ago. She has a 75.00 pack-year smoking history. she has never used smokeless tobacco. She reports that she drinks about 1.2 oz of alcohol per week. She reports that she does not use drugs.  Patient is a former 3 pack per day smoker for 25 years (75 pack year). Patient lives in [place]. She is a former [employer] manager. Patient denies known exposures to radiation on toxins. The patient is alone today.  Allergies:  Allergies  Allergen Reactions  . Codeine Other (See Comments)    hallucinations    . Simvastatin Diarrhea and Nausea Only    Current Medications: Current Outpatient Medications  Medication Sig Dispense Refill  . aspirin EC 81 MG tablet Take 1 tablet (81 mg total) by mouth daily. 90 tablet 3  . atorvastatin (LIPITOR) 20 MG tablet Take 1 tablet (20 mg total) by mouth daily. 90 tablet 4  . benazepril (LOTENSIN) 20 MG tablet Take 1 tablet (20 mg total) by mouth daily. 90 tablet 4  . levothyroxine (SYNTHROID, LEVOTHROID) 100 MCG tablet Take 1 tablet (100 mcg total) by mouth daily. 30 tablet 3  . Multiple Vitamins-Minerals (CENTRUM SILVER ULTRA WOMENS PO) Take 1 tablet by mouth daily.     Marland Kitchen ibuprofen (ADVIL,MOTRIN) 200 MG tablet Take 400 mg by mouth every 8 (eight) hours as needed for mild pain.     No current facility-administered medications for this visit.     Review of Systems:  GENERAL:  Fatigue > 1 year.  No fevers or sweats. 30 pound weight loss over the last 2 years. Weight up 8 pounds since her last visit.  PERFORMANCE STATUS (ECOG):  1 HEENT:  Runny nose. Cataract surgery 2-3 years ago.  Change in vision.  No sore throat, mouth sores or tenderness. Lungs: Chronic cough, non-productive.  COPD. No shortness of breath.  No hemoptysis. Cardiac:  No chest pain, palpitations, orthopnea, or PND. GI:  Transient vomiting episodes. No appetite.  Diarrhea x 1 year.  No nausea constipation, melena or hematochezia.  Colonoscopy, long time ago (refuses further). GU:  Malodorous urine. No urgency, frequency, dysuria, or hematuria. Musculoskeletal:  Osteoarthritis s/p 2 shoulder and 1 knee replacement.  No back pain.  No muscle tenderness. Extremities:  No pain or swelling. Skin:  Dry itchy skin.  No rashes. Neuro:  Restless legs.  No headache, numbness or weakness, balance or coordination  issues. Endocrine:  No diabetes.  Thyroid disease on Synthroid (recent change in dose).  No hot flashes or night sweats. Psych:  No mood changes, depression or anxiety. Pain:  No focal pain. Review of systems:  All other systems reviewed and found to be negative.  Physical Exam: Blood pressure 114/78, pulse 83, temperature 97.7 F (36.5 C), temperature source Tympanic, resp. rate 18, height '5\' 5"'  (1.651 m), weight 179 lb (81.2 kg), SpO2 97 %. GENERAL:  Well developed, well nourished, woman sitting comfortably in the exam room in no acute distress.  She appears older than stated age. MENTAL STATUS:  Alert  and oriented to person, place and time. HEAD:  Long brown hair.  Normocephalic, atraumatic, face symmetric, no Cushingoid features. EYES:  Blue eyes.  No conjunctivitis or scleral icterus. NEUROLOGICAL: Unremarkable. PSYCH:  Appropriate.   No visits with results within 3 Day(s) from this visit.  Latest known visit with results is:  Orders Only on 07/04/2017  Component Date Value Ref Range Status  . Total Protein, Urine 07/04/2017 4.0  Not Estab. mg/dL Final  . Total Protein, Urine-Ur/day 07/04/2017 64  30 - 150 mg/24 hr Final  . Albumin, U 07/04/2017 8.0  % Final  . ALPHA 1 URINE 07/04/2017 5.5  % Final  . Alpha 2, Urine 07/04/2017 11.5  % Final  . % BETA, Urine 07/04/2017 21.0  % Final  . GAMMA GLOBULIN URINE 07/04/2017 54.1  % Final  . Free Kappa Lt Chains,Ur 07/04/2017 28.80* 1.35 - 24.19 mg/L Final  . Free Lambda Lt Chains,Ur 07/04/2017 63.80* 0.24 - 6.66 mg/L Corrected  . Free Kappa/Lambda Ratio 07/04/2017 0.45* 2.04 - 10.37 Corrected   Comment: (NOTE) Performed At: [facility] [address] [place], [place] [id number] Rush Farmer MD AS:[id number]   . Immunofixation Result, Urine 07/04/2017 Comment   Corrected   Bence Jones Protein positive; lambda type.  . Total Volume 07/04/2017 1600ML   Final  . M-SPIKE %, Urine 07/04/2017 27.4* Not Observed % Corrected  .  M-Spike, Mg/24 Hr 07/04/2017 18* Not Observed mg/24 hr Corrected  . Note: 07/04/2017 Comment   Corrected   Comment: (NOTE) Protein electrophoresis scan will follow via computer, mail, or courier delivery. Performed at [facility], [address], [place], [place] [address]     Assessment:  Bailey Ayala is a 77 y.o. female with a monoclonal gammopathy. SPEP on 06/21/2017 revealed a 1.2 gm/dL monoclonal spike.  Random urine revealed 4.6 mg/dL protein and a 40.6% M-spike.  Calcium, albumen, and serum protein were normal.  Work-up on 06/30/2017 revealed a hematocrit of 31.1, hemoglobin 10.4, MCV 97.4, platelets 227,000, WBC 5700 with an ANC of 3300.  SPEP revealed a 1.1 gm/dL IgG monoclonal protein with lambda light chain specificity and monoclonal free lambda light chains (Bence-Jones protein).  IgG was 1660 ([phone]) and IgA was 48 (64-422).  Kappa free light chains were 23.7, lambda free light chains were 142.2 and ratio 0.17 (0.26-1.65).  Beta2-microglobulin was 4.7 (0.6-2.4).  Normal studies included:  B12, folate, and ferritin (75).  TSH was 0.204 (low).  24 hour UPEP is pending.  Bone survey on 06/30/2017 revealed no suspicious focal bony lesions.  She has stage III chronic kidney disease (Cr 1.47; CrCl 34-40 ml/min).     She has a normocytic anemia.  Iron studies were normal on 06/21/2017.  Appetite is poor.  Last colonoscopy was > 10 years ago (she declines).  She has a 75 pack year smoking history.  She has COPD.  Symptomatically, she is fatigued. She has a chronic cough. Appetite is poor overall. She gained weight since her last clinic visit.  Exam is unremarkable.  Plan: 1.  Review interval labs- 1.1 gm/dL IgG monoclonal protein with lambda light chain specificity and monoclonal free lambda light chains (Bence-Jones protein).  IgG was slightly elevated with low IgA.  Doubt MGUS as IgA low (typically other immunoglobulins are normal).  Beta2-microglobulin high (can be  elevated with renal insufficiency).  Work-up of anemia unrevealing except for monoclonal gammopathy.  Discuss plan for bone marrow aspirate and biopsy.  2.  Review interval bone survey- no  lytic lesions. 3.  Follow-up with Dr. Jeananne Rama for low TSH.  PCP aware and has adjusted patient's levothyroxine.  4.  Follow up with Shawn about LDCT program. If patient does not qualify for the program, will anticipate ordering a non-contrast chest CT.  5.  Labs today: GI panel. 6.  Schedule bone marrow biopsy. 7.  RTC 2 weeks after bone marrow biopsy for  MD assessment, review biopsy, and discussion regarding direction of therapy.   Honor Loh, NP  07/10/2017, 4:28 PM   I saw and evaluated the patient, participating in the key portions of the service and reviewing pertinent diagnostic studies and records.  I reviewed the nurse practitioner's note and agree with the findings and the plan.  The assessment and plan were discussed with the patient.  Several questions were asked by the patient and answered.   Nolon Stalls, MD 07/10/2017, 4:28 PM

## 2017-07-10 NOTE — Progress Notes (Signed)
No new changes noted today 

## 2017-07-11 ENCOUNTER — Telehealth: Payer: Self-pay | Admitting: *Deleted

## 2017-07-11 ENCOUNTER — Other Ambulatory Visit: Payer: Self-pay | Admitting: Urgent Care

## 2017-07-11 DIAGNOSIS — D472 Monoclonal gammopathy: Secondary | ICD-10-CM

## 2017-07-11 DIAGNOSIS — R634 Abnormal weight loss: Secondary | ICD-10-CM

## 2017-07-11 DIAGNOSIS — J449 Chronic obstructive pulmonary disease, unspecified: Secondary | ICD-10-CM

## 2017-07-11 DIAGNOSIS — Z87891 Personal history of nicotine dependence: Secondary | ICD-10-CM

## 2017-07-11 NOTE — Telephone Encounter (Signed)
Patient called to find out when her Bone Marrow Biopsy was scheduled. There is nothing scheduled. Please call her if you have any information regarding scheduling.

## 2017-07-11 NOTE — Telephone Encounter (Signed)
She has been scheduled on 07/24/2017 for her biopsy. Additionally, she has been scheduled of 07/25/2017 for her non-contrast CT of the chest. Patient needs to be scheduled for RTC in 10-14 days after her biopsy. Patient is aware of appointments thus far. Scheduling will be following up with her to schedule her RTC visit.

## 2017-07-11 NOTE — Telephone Encounter (Signed)
Patient returned call and reviewed eligibility information. Patient understands that she is not a candidate for lung cancer screening scan due to smoking cessation > 15 years ago.

## 2017-07-12 ENCOUNTER — Other Ambulatory Visit: Payer: Self-pay | Admitting: *Deleted

## 2017-07-12 DIAGNOSIS — R195 Other fecal abnormalities: Secondary | ICD-10-CM | POA: Diagnosis not present

## 2017-07-12 DIAGNOSIS — R634 Abnormal weight loss: Secondary | ICD-10-CM | POA: Diagnosis not present

## 2017-07-12 DIAGNOSIS — D472 Monoclonal gammopathy: Secondary | ICD-10-CM | POA: Diagnosis not present

## 2017-07-12 DIAGNOSIS — R5382 Chronic fatigue, unspecified: Secondary | ICD-10-CM | POA: Diagnosis not present

## 2017-07-12 DIAGNOSIS — R630 Anorexia: Secondary | ICD-10-CM | POA: Diagnosis not present

## 2017-07-12 DIAGNOSIS — R1110 Vomiting, unspecified: Secondary | ICD-10-CM | POA: Diagnosis not present

## 2017-07-12 LAB — GASTROINTESTINAL PANEL BY PCR, STOOL (REPLACES STOOL CULTURE)

## 2017-07-13 DIAGNOSIS — M3501 Sicca syndrome with keratoconjunctivitis: Secondary | ICD-10-CM | POA: Diagnosis not present

## 2017-07-18 DIAGNOSIS — Z1231 Encounter for screening mammogram for malignant neoplasm of breast: Secondary | ICD-10-CM | POA: Diagnosis not present

## 2017-07-18 LAB — HM MAMMOGRAPHY

## 2017-07-19 ENCOUNTER — Telehealth: Payer: Self-pay

## 2017-07-19 DIAGNOSIS — E039 Hypothyroidism, unspecified: Secondary | ICD-10-CM

## 2017-07-19 NOTE — Telephone Encounter (Signed)
Patient last seen 05/29/17 and has f/up 11/28/17.

## 2017-07-24 ENCOUNTER — Ambulatory Visit: Payer: Medicare Other

## 2017-07-25 ENCOUNTER — Other Ambulatory Visit: Payer: Self-pay | Admitting: Radiology

## 2017-07-25 ENCOUNTER — Ambulatory Visit
Admission: RE | Admit: 2017-07-25 | Discharge: 2017-07-25 | Disposition: A | Discharge status: Home / Self Care | Payer: Medicare Other | Source: Ambulatory Visit | Attending: Urgent Care | Admitting: Urgent Care

## 2017-07-25 DIAGNOSIS — R634 Abnormal weight loss: Secondary | ICD-10-CM | POA: Diagnosis not present

## 2017-07-25 DIAGNOSIS — J432 Centrilobular emphysema: Secondary | ICD-10-CM | POA: Diagnosis not present

## 2017-07-25 DIAGNOSIS — R5383 Other fatigue: Secondary | ICD-10-CM | POA: Insufficient documentation

## 2017-07-25 DIAGNOSIS — J449 Chronic obstructive pulmonary disease, unspecified: Secondary | ICD-10-CM

## 2017-07-25 DIAGNOSIS — Z87891 Personal history of nicotine dependence: Secondary | ICD-10-CM | POA: Diagnosis not present

## 2017-07-25 DIAGNOSIS — D472 Monoclonal gammopathy: Secondary | ICD-10-CM | POA: Diagnosis not present

## 2017-07-25 DIAGNOSIS — I700 Atherosclerosis of aorta: Secondary | ICD-10-CM | POA: Diagnosis not present

## 2017-07-25 DIAGNOSIS — J439 Emphysema, unspecified: Secondary | ICD-10-CM | POA: Diagnosis not present

## 2017-07-26 ENCOUNTER — Other Ambulatory Visit (HOSPITAL_COMMUNITY)
Admission: RE | Admit: 2017-07-26 | Disposition: A | Discharge status: Home / Self Care | Payer: Medicare Other | Source: Ambulatory Visit | Attending: Hematology and Oncology | Admitting: Hematology and Oncology

## 2017-07-26 ENCOUNTER — Ambulatory Visit
Admission: RE | Admit: 2017-07-26 | Discharge: 2017-07-26 | Disposition: A | Discharge status: Home / Self Care | Payer: Medicare Other | Source: Ambulatory Visit | Attending: Urgent Care | Admitting: Urgent Care

## 2017-07-26 DIAGNOSIS — Z96611 Presence of right artificial shoulder joint: Secondary | ICD-10-CM | POA: Insufficient documentation

## 2017-07-26 DIAGNOSIS — Z79899 Other long term (current) drug therapy: Secondary | ICD-10-CM | POA: Diagnosis not present

## 2017-07-26 DIAGNOSIS — Z96612 Presence of left artificial shoulder joint: Secondary | ICD-10-CM | POA: Insufficient documentation

## 2017-07-26 DIAGNOSIS — N189 Chronic kidney disease, unspecified: Secondary | ICD-10-CM | POA: Diagnosis not present

## 2017-07-26 DIAGNOSIS — Z885 Allergy status to narcotic agent status: Secondary | ICD-10-CM | POA: Diagnosis not present

## 2017-07-26 DIAGNOSIS — E039 Hypothyroidism, unspecified: Secondary | ICD-10-CM | POA: Insufficient documentation

## 2017-07-26 DIAGNOSIS — D472 Monoclonal gammopathy: Secondary | ICD-10-CM | POA: Insufficient documentation

## 2017-07-26 DIAGNOSIS — E785 Hyperlipidemia, unspecified: Secondary | ICD-10-CM | POA: Insufficient documentation

## 2017-07-26 DIAGNOSIS — Z87891 Personal history of nicotine dependence: Secondary | ICD-10-CM | POA: Diagnosis not present

## 2017-07-26 DIAGNOSIS — E1122 Type 2 diabetes mellitus with diabetic chronic kidney disease: Secondary | ICD-10-CM | POA: Insufficient documentation

## 2017-07-26 DIAGNOSIS — M810 Age-related osteoporosis without current pathological fracture: Secondary | ICD-10-CM | POA: Diagnosis not present

## 2017-07-26 DIAGNOSIS — D72822 Plasmacytosis: Secondary | ICD-10-CM | POA: Diagnosis not present

## 2017-07-26 DIAGNOSIS — Z96652 Presence of left artificial knee joint: Secondary | ICD-10-CM | POA: Diagnosis not present

## 2017-07-26 DIAGNOSIS — J449 Chronic obstructive pulmonary disease, unspecified: Secondary | ICD-10-CM | POA: Diagnosis not present

## 2017-07-26 DIAGNOSIS — Z79890 Hormone replacement therapy (postmenopausal): Secondary | ICD-10-CM | POA: Insufficient documentation

## 2017-07-26 DIAGNOSIS — D7589 Other specified diseases of blood and blood-forming organs: Secondary | ICD-10-CM | POA: Diagnosis not present

## 2017-07-26 DIAGNOSIS — D649 Anemia, unspecified: Secondary | ICD-10-CM | POA: Diagnosis not present

## 2017-07-26 DIAGNOSIS — D631 Anemia in chronic kidney disease: Secondary | ICD-10-CM | POA: Insufficient documentation

## 2017-07-26 DIAGNOSIS — I129 Hypertensive chronic kidney disease with stage 1 through stage 4 chronic kidney disease, or unspecified chronic kidney disease: Secondary | ICD-10-CM | POA: Insufficient documentation

## 2017-07-26 DIAGNOSIS — Z7982 Long term (current) use of aspirin: Secondary | ICD-10-CM | POA: Diagnosis not present

## 2017-07-26 DIAGNOSIS — Z8673 Personal history of transient ischemic attack (TIA), and cerebral infarction without residual deficits: Secondary | ICD-10-CM | POA: Insufficient documentation

## 2017-07-26 LAB — PROTIME-INR
INR: 1
Prothrombin Time: 13.1 seconds (ref 11.4–15.2)

## 2017-07-26 LAB — CBC WITH DIFFERENTIAL/PLATELET
Basophils Absolute: 0 10*3/uL (ref 0–0.1)
Basophils Relative: 0 %
Eosinophils Absolute: 0.5 10*3/uL (ref 0–0.7)
Eosinophils Relative: 8 %
HCT: 31.8 % — ABNORMAL LOW (ref 35.0–47.0)
Hemoglobin: 10.5 g/dL — ABNORMAL LOW (ref 12.0–16.0)
Lymphocytes Relative: 18 %
Lymphs Abs: 1 10*3/uL (ref 1.0–3.6)
MCH: 32.2 pg (ref 26.0–34.0)
MCHC: 32.8 g/dL (ref 32.0–36.0)
MCV: 97.9 fL (ref 80.0–100.0)
Monocytes Absolute: 0.4 10*3/uL (ref 0.2–0.9)
Monocytes Relative: 7 %
Neutro Abs: 3.7 10*3/uL (ref 1.4–6.5)
Neutrophils Relative %: 67 %
Platelets: 243 10*3/uL (ref 150–440)
RBC: 3.25 MIL/uL — ABNORMAL LOW (ref 3.80–5.20)
RDW: 15 % — ABNORMAL HIGH (ref 11.5–14.5)
WBC: 5.6 10*3/uL (ref 3.6–11.0)

## 2017-07-26 MED ORDER — MIDAZOLAM HCL 2 MG/2ML IJ SOLN
INTRAMUSCULAR | Status: AC | PRN
  Administered 2017-07-26 (×2): 1 mg via INTRAVENOUS

## 2017-07-26 MED ORDER — FENTANYL CITRATE (PF) 100 MCG/2ML IJ SOLN
INTRAMUSCULAR | Status: AC
  Filled 2017-07-26: qty 2

## 2017-07-26 MED ORDER — LIDOCAINE HCL 1 % IJ SOLN
INTRAMUSCULAR | Status: AC | PRN
  Administered 2017-07-26: 10 mL via INTRADERMAL

## 2017-07-26 MED ORDER — MIDAZOLAM HCL 2 MG/2ML IJ SOLN
INTRAMUSCULAR | Status: AC
  Filled 2017-07-26: qty 4

## 2017-07-26 MED ORDER — FENTANYL CITRATE (PF) 100 MCG/2ML IJ SOLN
INTRAMUSCULAR | Status: AC | PRN
  Administered 2017-07-26 (×2): 50 ug via INTRAVENOUS

## 2017-07-26 MED ORDER — SODIUM CHLORIDE 0.9 % IV SOLN
INTRAVENOUS | Status: DC
  Administered 2017-07-26: 09:00:00 via INTRAVENOUS

## 2017-07-26 MED ORDER — HEPARIN SOD (PORK) LOCK FLUSH 100 UNIT/ML IV SOLN
INTRAVENOUS | Status: AC
  Filled 2017-07-26: qty 5

## 2017-07-26 NOTE — H&P (Signed)
Chief Complaint:  "here for CT BM asp and core bx  Referring Physician(s): [place]    History of Present Illness: Bailey Ayala is a 77 y.o. female with abnormal SPEP, CKD, and anemia.  Here for CT BM asp and core bx today. No complaints.  Concern for monoclonal gammopathy, MDS or Myeloma.  Past Medical History:  Diagnosis Date  . Arthritis   . Blood dyscrasia    BLEEDS EASILY   . COPD (chronic obstructive pulmonary disease) ([place])   . Diabetes mellitus without complication ([place])   . Diffuse cystic mastopathy   . Hyperlipidemia   . Hypertension   . Hypothyroidism   . L4-L5 disc bulge   . Osteoporosis, post-menopausal   . PAD (peripheral artery disease) ([place])   . Shortness of breath dyspnea    WITH EXERTION   . Stroke ([place])    TIA     2016     WAS FOUND ON SCAN ORDERED FROM NEUROL  . Thyroid disease   . TIA (transient ischemic attack) 2016   left hand weakness    Past Surgical History:  Procedure Laterality Date  . ABDOMINAL HYSTERECTOMY  1990  . BACK SURGERY  2011  . BREAST EXCISIONAL BIOPSY Left    2010?   Marland Kitchen CATARACT EXTRACTION, BILATERAL    . COLONOSCOPY  1998  . EYE SURGERY Bilateral 05/06/14  . JOINT REPLACEMENT     LT KNEE  . KNEE SURGERY Left 2012  . OOPHORECTOMY  1990  . parathyroid transplant     2/3 THYROID REMOVED   (GOITER)  . SPINE SURGERY    . THYROID SURGERY    . TONSILLECTOMY    . TOTAL SHOULDER ARTHROPLASTY Left 01/14/2016   Procedure: LEFT TOTAL SHOULDER ARTHROPLASTY;  Surgeon: Justice Britain, MD;  Location: [facility];  Service: Orthopedics;  Laterality: Left;  . TOTAL SHOULDER ARTHROPLASTY Right 11/03/2016   Procedure: RIGHT TOTAL SHOULDER ARTHROPLASTY;  Surgeon: Justice Britain, MD;  Location: [facility];  Service: Orthopedics;  Laterality: Right;  . ULNAR NERVE REPAIR     LEFT  . vein closure procedure Left 2010    Allergies: Codeine and Simvastatin  Medications: Prior to Admission medications   Medication Sig Start Date End Date  Taking? Authorizing Provider  aspirin EC 81 MG tablet Take 1 tablet (81 mg total) by mouth daily. 08/05/16  Yes Wellington Hampshire, MD  atorvastatin (LIPITOR) 20 MG tablet Take 1 tablet (20 mg total) by mouth daily. 05/29/17  Yes Crissman, Jeannette How, MD  benazepril (LOTENSIN) 20 MG tablet Take 1 tablet (20 mg total) by mouth daily. 05/29/17  Yes Crissman, Jeannette How, MD  ibuprofen (ADVIL,MOTRIN) 200 MG tablet Take 400 mg by mouth every 8 (eight) hours as needed for mild pain.   Yes [provider]  levothyroxine (SYNTHROID, LEVOTHROID) 100 MCG tablet Take 1 tablet (100 mcg total) by mouth daily. 07/03/17  Yes Johnson, Megan P, DO  Multiple Vitamins-Minerals (CENTRUM SILVER ULTRA WOMENS PO) Take 1 tablet by mouth daily.    Yes [provider]     Family History  Problem Relation Age of Onset  . Heart disease Mother        MI  . Arthritis Mother   . Stroke Mother   . Hyperlipidemia Mother   . Heart disease Father        MI  . Hyperlipidemia Father   . Diabetes Sister   . Hyperlipidemia Brother     Social  History   Socioeconomic History  . Marital status: Widowed    Spouse name: Not on file  . Number of children: Not on file  . Years of education: Not on file  . Highest education level: Not on file  Occupational History  . Not on file  Social Needs  . Financial resource strain: Not on file  . Food insecurity:    Worry: Not on file    Inability: Not on file  . Transportation needs:    Medical: Not on file    Non-medical: Not on file  Tobacco Use  . Smoking status: Former Smoker    Packs/day: 3.00    Years: 25.00    Pack years: 75.00    Last attempt to quit: 11/03/1989    Years since quitting: 27.7  . Smokeless tobacco: Never Used  Substance and Sexual Activity  . Alcohol use: Yes    Alcohol/week: 1.2 oz    Types: 2 Shots of liquor per week    Comment: 2x weekly-whiskey occ recently  . Drug use: No  . Sexual activity: Not on file  Lifestyle  . Physical activity:     Days per week: Not on file    Minutes per session: Not on file  . Stress: Not on file  Relationships  . Social connections:    Talks on phone: Not on file    Gets together: Not on file    Attends religious service: Not on file    Active member of club or organization: Not on file    Attends meetings of clubs or organizations: Not on file    Relationship status: Not on file  Other Topics Concern  . Not on file  Social History Narrative  . Not on file      Review of Systems: A 12 point ROS discussed and pertinent positives are indicated in the HPI above.  All other systems are negative.  Review of Systems  Vital Signs: BP (!) 158/68   Pulse 83   Temp 98 F (36.7 C) (Oral)   Resp 13   Ht '5\' 5"'  (1.651 m)   Wt 174 lb (78.9 kg)   SpO2 98%   BMI 28.96 kg/m   Physical Exam  Constitutional: She is oriented to person, place, and time. She appears well-developed and well-nourished. No distress.  Eyes: Conjunctivae are normal. No scleral icterus.  Cardiovascular: Normal rate and regular rhythm.  No murmur heard. Pulmonary/Chest: Effort normal and breath sounds normal. No respiratory distress.  Abdominal: Soft. Bowel sounds are normal. She exhibits no distension.  Neurological: She is alert and oriented to person, place, and time.  Skin: She is not diaphoretic.  Psychiatric: She has a normal mood and affect.    Imaging: Ct Chest Wo Contrast  Result Date: 07/25/2017 CLINICAL DATA:  Short of breath with exertion. Former smoker. Unintentional weight loss EXAM: CT CHEST WITHOUT CONTRAST TECHNIQUE: Multidetector CT imaging of the chest was performed following the standard protocol without IV contrast. COMPARISON:  None. FINDINGS: Cardiovascular: Coronary artery calcification and aortic atherosclerotic calcification. Mediastinum/Nodes: No axillary or supraclavicular adenopathy. No mediastinal adenopathy. No pericardial effusion. Esophagus normal. Lungs/Pleura: Mild centrilobular  emphysema. No suspicious nodularity. Mild linear atelectasis the lung bases. Upper Abdomen: Limited view of the liver, kidneys, pancreas are unremarkable. Normal adrenal glands. Musculoskeletal: No aggressive osseous lesion. Posterior lumbar fusion. IMPRESSION: 1. No suspicious pulmonary nodules. 2. Aortic Atherosclerosis (ICD10-I70.0) and Emphysema (ICD10-J43.9). Electronically Signed   By: Suzy Bouchard M.D.   On:  07/25/2017 12:40   Dg Bone Survey Met  Result Date: 06/30/2017 CLINICAL DATA:  Monoclonal gammopathy.  Evaluate bony involvement. EXAM: METASTATIC BONE SURVEY COMPARISON:  Prior radiographs FINDINGS: No suspicious focal bony lesions are identified. Surgical changes within both shoulders, lumbar spine and LEFT knee noted. Degenerative changes within the spine are present. IMPRESSION: No suspicious focal bony lesions. Electronically Signed   By: Margarette Canada M.D.   On: 06/30/2017 18:16    Labs:  CBC: Recent Labs    11/02/16 1449 05/29/17 1441 06/30/17 1608 07/26/17 0800  WBC 4.4 5.2 5.7 5.6  HGB 10.9* 10.5* 10.4* 10.5*  HCT 33.8* 32.0* 31.1* 31.8*  PLT 250 237 227 243    COAGS: Recent Labs    07/26/17 0800  INR 1.00    BMP: Recent Labs    11/02/16 1449 11/03/16 0816 05/29/17 1339 05/29/17 1441  NA 140 136 138 140  K 4.6 4.0 4.7 4.9  CL 101 105 103 103  CO2 19* 21* 21 22  GLUCOSE 105* 96 101* 89  BUN 30* 34* 22 21  CALCIUM 9.4 9.8 9.2 9.4  CREATININE 1.56* 1.56* 1.28* 1.33*  GFRNONAA 32* 31* 41* 39*  GFRAA 37* 36* 47* 45*    LIVER FUNCTION TESTS: Recent Labs    10/06/16 1324 11/02/16 1449 05/29/17 1333 05/29/17 1441  BILITOT  --  0.2  --  0.3  AST '29 20 28 20  ' ALT '15 18 23 15  ' ALKPHOS  --  88  --  89  PROT  --  7.6  --  7.5  ALBUMIN  --  4.7  --  4.3    TUMOR MARKERS: No results for input(s): AFPTM, CEA, CA199, CHROMGRNA in the last 8760 hours.  Assessment and Plan:  Anemia, monoclonal gammopathy, CKD.  Plan for CT BM asp and core today.   Consent obtained.  All questions answered.  Risks and benefits discussed with the patient including, but not limited to bleeding, infection, damage to adjacent structures or low yield requiring additional tests.  All of the patient's questions were answered, patient is agreeable to proceed. Consent signed and in chart.    Thank you for this interesting consult.  I greatly enjoyed meeting Bailey Ayala and look forward to participating in their care.  A copy of this report was sent to the requesting provider on this date.  Electronically Signed: Greggory Keen, MD 07/26/2017, 8:41 AM   I spent a total of  30 Minutes   in face to face in clinical consultation, greater than 50% of which was counseling/coordinating care for with anemia and monoclonal gammopathy.

## 2017-07-26 NOTE — Procedures (Signed)
Monoclonal gammopathy  S/p CT BM ASP AND CORE BX  No comp Stable EBL min Path pending Full report in pacs

## 2017-07-26 NOTE — Discharge Instructions (Signed)
Moderate Conscious Sedation, Adult, Care After These instructions provide you with information about caring for yourself after your procedure. Your health care provider may also give you more specific instructions. Your treatment has been planned according to current medical practices, but problems sometimes occur. Call your health care provider if you have any problems or questions after your procedure. What can I expect after the procedure? After your procedure, it is common:  To feel sleepy for several hours.  To feel clumsy and have poor balance for several hours.  To have poor judgment for several hours.  To vomit if you eat too soon.  Follow these instructions at home: For at least 24 hours after the procedure:   Do not: ? Participate in activities where you could fall or become injured. ? Drive. ? Use heavy machinery. ? Drink alcohol. ? Take sleeping pills or medicines that cause drowsiness. ? Make important decisions or sign legal documents. ? Take care of children on your own.  Rest. Eating and drinking  Follow the diet recommended by your health care provider.  If you vomit: ? Drink water, juice, or soup when you can drink without vomiting. ? Make sure you have little or no nausea before eating solid foods. General instructions  Have a responsible adult stay with you until you are awake and alert.  Take over-the-counter and prescription medicines only as told by your health care provider.  If you smoke, do not smoke without supervision.  Keep all follow-up visits as told by your health care provider. This is important. Contact a health care provider if:  You keep feeling nauseous or you keep vomiting.  You feel light-headed.  You develop a rash.  You have a fever. Get help right away if:  You have trouble breathing. This information is not intended to replace advice given to you by your health care provider. Make sure you discuss any questions you have  with your health care provider. Document Released: 01/30/2013 Document Revised: 09/14/2015 Document Reviewed: 08/01/2015 Elsevier Interactive Patient Education  2018 [place]. Bone Marrow Aspiration and Bone Marrow Biopsy, Adult, Care After This sheet gives you information about how to care for yourself after your procedure. Your health care provider may also give you more specific instructions. If you have problems or questions, contact your health care provider. What can I expect after the procedure? After the procedure, it is common to have:  Mild pain and tenderness.  Swelling.  Bruising.  Follow these instructions at home:  Take over-the-counter or prescription medicines only as told by your health care provider.  Do not take baths, swim, or use a hot tub until your health care provider approves. Ask if you can take a shower or have a sponge bath.  Follow instructions from your health care provider about how to take care of the puncture site. Make sure you: ? Wash your hands with soap and water before you change your bandage (dressing). If soap and water are not available, use hand sanitizer. ? Change your dressing as told by your health care provider.  Check your puncture siteevery day for signs of infection. Check for: ? More redness, swelling, or pain. ? More fluid or blood. ? Warmth. ? Pus or a bad smell.  Return to your normal activities as told by your health care provider. Ask your health care provider what activities are safe for you.  Do not drive for 24 hours if you were given a medicine to help you relax (sedative).  Keep all follow-up visits as told by your health care provider. This is important. °Contact a health care provider if: °· You have more redness, swelling, or pain around the puncture site. °· You have more fluid or blood coming from the puncture site. °· Your puncture site feels warm to the touch. °· You have pus or a bad smell coming from the  puncture site. °· You have a fever. °· Your pain is not controlled with medicine. °This information is not intended to replace advice given to you by your health care provider. Make sure you discuss any questions you have with your health care provider. °Document Released: 10/29/2004 Document Revised: 10/30/2015 Document Reviewed: 09/23/2015 °Elsevier Interactive Patient Education © 2018 [employer] ° °

## 2017-07-27 DIAGNOSIS — D631 Anemia in chronic kidney disease: Secondary | ICD-10-CM | POA: Diagnosis not present

## 2017-07-27 DIAGNOSIS — N183 Chronic kidney disease, stage 3 (moderate): Secondary | ICD-10-CM | POA: Diagnosis not present

## 2017-07-27 DIAGNOSIS — I10 Essential (primary) hypertension: Secondary | ICD-10-CM | POA: Diagnosis not present

## 2017-07-27 DIAGNOSIS — D472 Monoclonal gammopathy: Secondary | ICD-10-CM | POA: Diagnosis not present

## 2017-07-27 NOTE — Telephone Encounter (Signed)
Received call from Quillian Quince w/[facility] requesting status of RX for levothyroxine. Request was denied for pt to contact provider first. Please advise further.

## 2017-07-28 NOTE — Telephone Encounter (Signed)
MEdication refilled with 3 refills 07/03/17. Her last thyroid was over treated and her dose was changed. She is due to come back in for recheck on her TSH. She will need to have that done before 90 supply can be sent.

## 2017-07-31 NOTE — Telephone Encounter (Signed)
Called and let patient know what Dr. Wynetta Emery said. Patient stated she is going to stop by to have thyroid checked.

## 2017-08-01 ENCOUNTER — Other Ambulatory Visit: Payer: Medicare Other

## 2017-08-01 DIAGNOSIS — E039 Hypothyroidism, unspecified: Secondary | ICD-10-CM | POA: Diagnosis not present

## 2017-08-02 ENCOUNTER — Other Ambulatory Visit: Payer: Self-pay | Admitting: Family Medicine

## 2017-08-02 DIAGNOSIS — E039 Hypothyroidism, unspecified: Secondary | ICD-10-CM

## 2017-08-02 LAB — TSH: TSH: 0.853 u[IU]/mL (ref 0.450–4.500)

## 2017-08-02 MED ORDER — LEVOTHYROXINE SODIUM 100 MCG PO TABS: 100.0000 ug | ORAL_TABLET | Freq: Every day | ORAL | 3 refills | Status: DC

## 2017-08-04 ENCOUNTER — Encounter (HOSPITAL_COMMUNITY): Payer: Self-pay | Admitting: Hematology and Oncology

## 2017-08-07 ENCOUNTER — Ambulatory Visit: Payer: Medicare Other | Admitting: Hematology and Oncology

## 2017-08-07 LAB — TISSUE HYBRIDIZATION (BONE MARROW)-NCBH

## 2017-08-07 LAB — CHROMOSOME ANALYSIS, BONE MARROW

## 2017-08-08 ENCOUNTER — Other Ambulatory Visit: Payer: Self-pay

## 2017-08-08 ENCOUNTER — Inpatient Hospital Stay: Payer: Medicare Other | Attending: Hematology and Oncology | Admitting: Hematology and Oncology

## 2017-08-08 ENCOUNTER — Encounter: Payer: Self-pay | Admitting: Hematology and Oncology

## 2017-08-08 VITALS — BP 148/83 | HR 76 | Temp 97.0°F | Resp 18 | Wt 177.2 lb

## 2017-08-08 DIAGNOSIS — Z8673 Personal history of transient ischemic attack (TIA), and cerebral infarction without residual deficits: Secondary | ICD-10-CM | POA: Diagnosis not present

## 2017-08-08 DIAGNOSIS — R911 Solitary pulmonary nodule: Secondary | ICD-10-CM | POA: Diagnosis not present

## 2017-08-08 DIAGNOSIS — Z79899 Other long term (current) drug therapy: Secondary | ICD-10-CM | POA: Diagnosis not present

## 2017-08-08 DIAGNOSIS — C9000 Multiple myeloma not having achieved remission: Secondary | ICD-10-CM | POA: Diagnosis not present

## 2017-08-08 DIAGNOSIS — G2581 Restless legs syndrome: Secondary | ICD-10-CM | POA: Diagnosis not present

## 2017-08-08 DIAGNOSIS — D649 Anemia, unspecified: Secondary | ICD-10-CM | POA: Diagnosis not present

## 2017-08-08 DIAGNOSIS — I129 Hypertensive chronic kidney disease with stage 1 through stage 4 chronic kidney disease, or unspecified chronic kidney disease: Secondary | ICD-10-CM | POA: Insufficient documentation

## 2017-08-08 DIAGNOSIS — N183 Chronic kidney disease, stage 3 (moderate): Secondary | ICD-10-CM | POA: Diagnosis not present

## 2017-08-08 DIAGNOSIS — I714 Abdominal aortic aneurysm, without rupture: Secondary | ICD-10-CM | POA: Diagnosis not present

## 2017-08-08 DIAGNOSIS — E1122 Type 2 diabetes mellitus with diabetic chronic kidney disease: Secondary | ICD-10-CM | POA: Diagnosis not present

## 2017-08-08 DIAGNOSIS — M1990 Unspecified osteoarthritis, unspecified site: Secondary | ICD-10-CM

## 2017-08-08 DIAGNOSIS — D472 Monoclonal gammopathy: Secondary | ICD-10-CM | POA: Diagnosis not present

## 2017-08-08 DIAGNOSIS — Z87891 Personal history of nicotine dependence: Secondary | ICD-10-CM | POA: Insufficient documentation

## 2017-08-08 DIAGNOSIS — I739 Peripheral vascular disease, unspecified: Secondary | ICD-10-CM | POA: Insufficient documentation

## 2017-08-08 DIAGNOSIS — E785 Hyperlipidemia, unspecified: Secondary | ICD-10-CM | POA: Diagnosis not present

## 2017-08-08 DIAGNOSIS — J449 Chronic obstructive pulmonary disease, unspecified: Secondary | ICD-10-CM

## 2017-08-08 DIAGNOSIS — M810 Age-related osteoporosis without current pathological fracture: Secondary | ICD-10-CM | POA: Insufficient documentation

## 2017-08-08 DIAGNOSIS — E039 Hypothyroidism, unspecified: Secondary | ICD-10-CM | POA: Insufficient documentation

## 2017-08-08 DIAGNOSIS — Z7982 Long term (current) use of aspirin: Secondary | ICD-10-CM | POA: Insufficient documentation

## 2017-08-08 NOTE — Progress Notes (Signed)
[facility] day:  08/08/2017  Chief Complaint: Bailey Ayala is a 77 y.o. female with a monoclonal gammopathy who is seen for review of interval bone marrow and discussion regarding direction of therapy.  HPI:  The patient was last seen in the medical oncology clinic on 07/10/2017.  At that time, she was fatigued. She had a chronic cough. Appetite was poor overall. She had gained weight since her last clinic visit.  Exam was unremarkable.  24 hour UPEP revealed kappa free light chains 28.80 mg/L, lambda free light chains 63.80 mg/L and ration 0.45 (2.04 - 10.37).  M spike was 27.4% (16 mg/24 hours).  Bone marrow aspirate and biopsy on 07/26/2017 revealed a slightly hypercellular marrow for age with trilineage hematopoiesis.  There was 13% plasmacytosis.  IHC stains for CD138 and in situ hybridization for kappa and lambda revealed interstitial cells and small clusters.  Lambda light chains appeared restricted.  Cytogenetics were normal.  Features were felt compatible with plasma cell neoplasm.  Chest CT without contrast on 07/25/2017 revealed no suspicious pulmonary nodules.  During the interim, patient has been doing "much better" since her thyroid medication was changed. Her energy has improved overall. She has been seen in consult by Dr. Holley Raring (nephrology). Patient notes that everything was "ok", and she was put on a 4 month follow up plan. Patient denies any B symptoms or interval infections.  Patient is eating well. Her weight is down 2 pounds. Patient denies pain in the clinic today.    Past Medical History:  Diagnosis Date  . Arthritis   . Blood dyscrasia    BLEEDS EASILY   . COPD (chronic obstructive pulmonary disease) ([place])   . Diabetes mellitus without complication ([place])   . Diffuse cystic mastopathy   . Hyperlipidemia   . Hypertension   . Hypothyroidism   . L4-L5 disc bulge   . Osteoporosis, post-menopausal   . PAD (peripheral  artery disease) ([place])   . Shortness of breath dyspnea    WITH EXERTION   . Stroke ([place])    TIA     2016     WAS FOUND ON SCAN ORDERED FROM NEUROL  . Thyroid disease   . TIA (transient ischemic attack) 2016   left hand weakness    Past Surgical History:  Procedure Laterality Date  . ABDOMINAL HYSTERECTOMY  1990  . BACK SURGERY  2011  . BREAST EXCISIONAL BIOPSY Left    2010?   Marland Kitchen CATARACT EXTRACTION, BILATERAL    . COLONOSCOPY  1998  . EYE SURGERY Bilateral 05/06/14  . JOINT REPLACEMENT     LT KNEE  . KNEE SURGERY Left 2012  . OOPHORECTOMY  1990  . parathyroid transplant     2/3 THYROID REMOVED   ([facility])  . SPINE SURGERY    . THYROID SURGERY    . TONSILLECTOMY    . TOTAL SHOULDER ARTHROPLASTY Left 01/14/2016   Procedure: LEFT TOTAL SHOULDER ARTHROPLASTY;  Surgeon: Justice Britain, MD;  Location: [facility];  Service: Orthopedics;  Laterality: Left;  . TOTAL SHOULDER ARTHROPLASTY Right 11/03/2016   Procedure: RIGHT TOTAL SHOULDER ARTHROPLASTY;  Surgeon: Justice Britain, MD;  Location: [facility];  Service: Orthopedics;  Laterality: Right;  . ULNAR NERVE REPAIR     LEFT  . vein closure procedure Left 2010    Family History  Problem Relation Age of Onset  . Heart disease Mother        MI  . Arthritis  Mother   . Stroke Mother   . Hyperlipidemia Mother   . Heart disease Father        MI  . Hyperlipidemia Father   . Diabetes Sister   . Hyperlipidemia Brother     Social History:  reports that she quit smoking about 27 years ago. She has a 75.00 pack-year smoking history. She has never used smokeless tobacco. She reports that she drinks about 1.2 oz of alcohol per week. She reports that she does not use drugs.  Patient is a former 3 pack per day smoker for 25 years (75 pack year). Patient lives in [place]. She is a former Electrical engineer. Patient denies known exposures to radiation on toxins. The patient is accompanied by her daughter, Bailey Ayala,  Today. [name] (daughter) joins in visit via phone.    Allergies:  Allergies  Allergen Reactions  . Codeine Other (See Comments)    hallucinations    . Simvastatin Diarrhea and Nausea Only    Current Medications: Current Outpatient Medications  Medication Sig Dispense Refill  . aspirin EC 81 MG tablet Take 1 tablet (81 mg total) by mouth daily. 90 tablet 3  . atorvastatin (LIPITOR) 20 MG tablet Take 1 tablet (20 mg total) by mouth daily. 90 tablet 4  . benazepril (LOTENSIN) 20 MG tablet Take 1 tablet (20 mg total) by mouth daily. 90 tablet 4  . levothyroxine (SYNTHROID, LEVOTHROID) 100 MCG tablet Take 1 tablet (100 mcg total) by mouth daily. 90 tablet 3  . Multiple Vitamins-Minerals (CENTRUM SILVER ULTRA WOMENS PO) Take 1 tablet by mouth daily.      No current facility-administered medications for this visit.     Review of Systems:  GENERAL:  Feels better since change in thyroid medication.  No fevers or sweats.  Weight down 2 pounds. PERFORMANCE STATUS (ECOG):  1 HEENT:  No visual changes, runny nose, sore throat, mouth sores or tenderness. Lungs: Chronic non-productive cough (no change).  COPD.  No shortness of breath or cough.  No hemoptysis. Cardiac:  No chest pain, palpitations, orthopnea, or PND. GI:  Eating well.  4-5 loose stools/day x 5 years.  No nausea, vomiting, constipation, melena or hematochezia.  Declines colonoscopy. GU:  No urgency, frequency, dysuria, or hematuria. Musculoskeletal:  Osteoarthritis s/p 2 shoulder and 1 knee replacement.  No back pain.  No joint pain.  No muscle tenderness. Extremities:  No pain or swelling. Skin:  Dry itchy skin.  No rashes. Neuro:  Restless legs.  No headache, numbness or weakness, balance or coordination issues. Endocrine:  No diabetes.  Thyroid disease with recent adjustment in Synthroid.  No hot flashes or night sweats. Psych:  No mood changes, depression or anxiety. Pain:  No focal pain. Review of systems:  All other systems reviewed and found to be negative.   Physical  Exam: Blood pressure (!) 148/83, pulse 76, temperature (!) 97 F (36.1 C), temperature source Tympanic, resp. rate 18, weight 177 lb 3.2 oz (80.4 kg). GENERAL:  Well developed, well nourished,woman sitting comfortably in the exam room in no acute distress.  She appears older than stated age. MENTAL STATUS:  Alert and oriented to person, place and time. HEAD:  Long dark brown hair.  Normocephalic, atraumatic, face symmetric, no Cushingoid features. HEENT:  Hearing aides. EYES:  Blue eyes.  No conjunctivitis or scleral icterus. NEUROLOGICAL:  Unremarkable. SKIN:  Right posterior iliac bone marrow site unremarkable with well healed tiny incision. PSYCH:  Appropriate.    No visits with results  within 3 Day(s) from this visit.  Latest known visit with results is:  Appointment on 08/01/2017  Component Date Value Ref Range Status  . TSH 08/01/2017 0.853  0.450 - 4.500 uIU/mL Final    Assessment:  GIULLIANA MCROBERTS is a 77 y.o. female with smoldering multiple myeloma.  SPEP on 06/21/2017 revealed a 1.2 gm/dL monoclonal spike.  Random urine revealed 4.6 mg/dL protein and a 40.6% M-spike.  Calcium, albumen, and serum protein were normal.  Work-up on 06/30/2017 revealed a hematocrit of 31.1, hemoglobin 10.4, MCV 97.4, platelets 227,000, WBC 5700 with an ANC of 3300.  SPEP revealed a 1.1 gm/dL IgG monoclonal protein with lambda light chain specificity and monoclonal free lambda light chains (Bence-Jones protein).  IgG was 1660 [phone]) and IgA was 48 (64-422).  Kappa free light chains were 23.7, lambda free light chains were 142.2 and ratio 0.17 (0.26-1.65).  Beta2-microglobulin was 4.7 (0.6-2.4).  Normal studies included:  B12, folate, and ferritin (75).  TSH was 0.204 (low).  24 hour UPEP revealed kappa free light chains 28.80 mg/L, lambda free light chains 63.80 mg/L and ration 0.45 (2.04 - 10.37).  M spike was 27.4% (16 mg/24 hours).  Bone survey on 06/30/2017 revealed no suspicious focal bony  lesions.  Bone marrow aspirate and biopsy on 07/26/2017 revealed a slightly hypercellular marrow for age with trilineage hematopoiesis.  There was 13% plasmacytosis.  IHC stains for CD138 and in situ hybridization for kappa and lambda revealed interstitial cells and small clusters.  Lambda light chains appeared restricted.  Features were felt compatible with plasma cell neoplasm.  She has stage III chronic kidney disease (Cr 1.47; CrCl 34-40 ml/min).     She has a normocytic anemia.  Iron studies were normal on 06/21/2017.  Appetite is poor.  Last colonoscopy was > 10 years ago (she declines).  She has a 75 pack year smoking history.  She has COPD.  Chest CT without contrast on 07/25/2017 revealed no suspicious pulmonary nodules.  Symptomatically, she has improved overall since thyroid replacement medication was changed.  Appetite "ok", She has lost 2 pounds. Patient denies B symptoms or recent infections. Exam is unremarkable.  Plan: 1.  Discuss bone marrow aspirate and biopsy.  Marrow reveals 13% lambda restricted light chain plasma cells c/w plasma cell neoplasm.  Current work-up suggests smoldering myeloma.  As no hypercalcemia, anemia (Hgb < 10), and FLC ratio not <= 0.01.  Renal insufficiency criteria is borderline (Cr not > 2, but CrCl 34-40 ml/min)  Discuss plan for PET scan as skeletal survey negative.  If PET scan positive, would require treatment with Revlimid + Velcade + Decadron (RVD) regimen.  If smoldering lymphoma, would require follow-up every 3 months. 2.  Discuss chest CT- no pulmonary nodules. 3.  Obtain records from recent visit with Dr. Holley Raring (naphrology).  4.  Schedule PET scan to r/o active bone disease. 5.  RTC after PET scan for MD assessment and to review PET scan.     Honor Loh, NP  08/08/2017, 11:51 AM   I saw and evaluated the patient, participating in the key portions of the service and reviewing pertinent diagnostic studies and records.  I reviewed the nurse  practitioner's note and agree with the findings and the plan.  The assessment and plan were discussed with the patient.  Several questions were asked by the patient and her family and were answered.   Nolon Stalls, MD 08/08/2017, 11:51 AM

## 2017-08-08 NOTE — Patient Instructions (Signed)
Multiple Myeloma  Multiple myeloma is a form of cancer that results from the uncontrolled growth of abnormal plasma cells. Plasma cells are a type of white blood cell produced in the soft tissue inside bones (bone marrow). These are cells in your blood that normally help you fight infection. They are part of your body's defense system (immune system). Plasma cells that become cancerous will grow out of control. As a result, they interfere with normal blood cells and many important functions that normal cells perform in your body. With multiple myeloma, the abnormal plasma cells cause multiple tumors to form.  Multiple myeloma damages your bones and causes other health problems because of its effect on blood cells. The disease progresses and reduces your ability to fight off infections.  What are the causes?  The cause of multiple myeloma is not known.  What increases the risk?  Risk factors include:   Being older than 65.   Being African American.   Having a family history of the disease.    What are the signs or symptoms?  Signs and symptoms of multiple myeloma may include:   Bone pain, especially in the back, ribs, and hips.   Broken bones (fractures).   Low blood counts, including reduced red blood cells (anemia), reduced white blood cells (leukopenia), and reduced platelets.   Fatigue.   Weakness.   Infections.   Bleeding, such as bleeding from the nose or gums, or increased bleeding from a scrape or cut.   High blood calcium levels.   Increased urination.   Confusion.    How is this diagnosed?  Your health care provider will do a physical exam and take your medical history. Tests will be done to help confirm the diagnosis. Tests may include:   Blood tests.   Urine tests.   X-rays.   MRI.   Bone marrow biopsy. In this test, a sample of marrow is removed from one of your bones. The sample is viewed under a microscope to check for abnormal plasma cells.    How is this treated?  There is no cure  for multiple myeloma. Treatment options may vary depending on how much the disease has advanced. Possible treatment options may include:   Medicines that kill cancer cells (chemotherapy).   Medicines that help prevent bone damage (bisphosphonates).   Radiation therapy. High-energy rays are used to kill cancer cells.   Surgery. This may be done to repair damage to bone.   Targeted drug therapy. These medicines block the growth and spread of cancer cells.   Immunotherapy. This is also called biologic therapy. It involves the use of medicines to strengthen the ability of your immune system to fight cancer cells.   Stem cell transplant. Healthy stem cells are infused into your body. These stem cells produce new blood cells to replace those killed by the disease or by other treatments. The healthy cells that are transplanted may be your own or may come from another person.   Plasmapheresis. This is a procedure used to remove plasma cells from your blood.   Other medicines to treat problems such as infections or pain.    Follow these instructions at home:   Take medicines only as directed by your health care provider.   Drink enough fluid to keep your urine clear or pale yellow.   Eat a well-balanced diet. Work with a dietitian to make sure you are getting the nutrition you need.   Take vitamins or dietary supplements as directed   a support group or seeking counseling to help you cope with the stress of having multiple myeloma.  Keep all follow-up visits as directed by your health care provider. This is important. Contact a health care provider if:  Your pain is not controlled with medicine or is getting worse.  You have a  fever.  You have swollen legs.  You have weakness or dizziness.  You have unexplained weight loss.  You have unexplained bleeding or bruising.  You have a cough or cold symptoms.  You feel depressed.  You have changes in urination or bowel movements. Get help right away if:  You have sudden severe pain, especially back pain.  You have numbness or weakness in your arms, hands, legs, or feet.  You have confusion.  You have weakness on one side of your body.  You have slurred speech.  You have trouble staying awake.  You have shortness of breath.  You have blood in your stool or urine.  You vomit or cough up blood. This information is not intended to replace advice given to you by your health care provider. Make sure you discuss any questions you have with your health care provider. Document Released: 01/04/2001 Document Revised: 09/17/2015 Document Reviewed: 11/12/2013 Elsevier Interactive Patient Education  [employer].

## 2017-08-08 NOTE — Progress Notes (Signed)
Patient here for follow-up. Offers no complaints.  °

## 2017-08-14 ENCOUNTER — Ambulatory Visit
Admission: RE | Admit: 2017-08-14 | Discharge: 2017-08-14 | Disposition: A | Discharge status: Home / Self Care | Payer: Medicare Other | Source: Ambulatory Visit | Attending: Urgent Care | Admitting: Urgent Care

## 2017-08-14 DIAGNOSIS — I700 Atherosclerosis of aorta: Secondary | ICD-10-CM | POA: Insufficient documentation

## 2017-08-14 DIAGNOSIS — R911 Solitary pulmonary nodule: Secondary | ICD-10-CM | POA: Insufficient documentation

## 2017-08-14 DIAGNOSIS — I714 Abdominal aortic aneurysm, without rupture: Secondary | ICD-10-CM | POA: Insufficient documentation

## 2017-08-14 DIAGNOSIS — C9000 Multiple myeloma not having achieved remission: Secondary | ICD-10-CM | POA: Diagnosis not present

## 2017-08-14 DIAGNOSIS — D472 Monoclonal gammopathy: Secondary | ICD-10-CM

## 2017-08-14 LAB — GLUCOSE, CAPILLARY: Glucose-Capillary: 98 mg/dL (ref 65–99)

## 2017-08-14 MED ORDER — FLUDEOXYGLUCOSE F - 18 (FDG) INJECTION
9.7800 | Freq: Once | INTRAVENOUS | Status: AC | PRN
  Administered 2017-08-14: 9.78 via INTRAVENOUS

## 2017-08-18 ENCOUNTER — Encounter: Payer: Self-pay | Admitting: Hematology and Oncology

## 2017-08-18 ENCOUNTER — Inpatient Hospital Stay (HOSPITAL_BASED_OUTPATIENT_CLINIC_OR_DEPARTMENT_OTHER): Payer: Medicare Other | Admitting: Hematology and Oncology

## 2017-08-18 VITALS — BP 125/73 | HR 64 | Temp 96.4°F | Wt 176.2 lb

## 2017-08-18 DIAGNOSIS — E1122 Type 2 diabetes mellitus with diabetic chronic kidney disease: Secondary | ICD-10-CM | POA: Diagnosis not present

## 2017-08-18 DIAGNOSIS — Z8673 Personal history of transient ischemic attack (TIA), and cerebral infarction without residual deficits: Secondary | ICD-10-CM

## 2017-08-18 DIAGNOSIS — E785 Hyperlipidemia, unspecified: Secondary | ICD-10-CM

## 2017-08-18 DIAGNOSIS — E039 Hypothyroidism, unspecified: Secondary | ICD-10-CM | POA: Diagnosis not present

## 2017-08-18 DIAGNOSIS — D649 Anemia, unspecified: Secondary | ICD-10-CM | POA: Diagnosis not present

## 2017-08-18 DIAGNOSIS — I714 Abdominal aortic aneurysm, without rupture, unspecified: Secondary | ICD-10-CM

## 2017-08-18 DIAGNOSIS — C9000 Multiple myeloma not having achieved remission: Secondary | ICD-10-CM

## 2017-08-18 DIAGNOSIS — I739 Peripheral vascular disease, unspecified: Secondary | ICD-10-CM

## 2017-08-18 DIAGNOSIS — I129 Hypertensive chronic kidney disease with stage 1 through stage 4 chronic kidney disease, or unspecified chronic kidney disease: Secondary | ICD-10-CM | POA: Diagnosis not present

## 2017-08-18 DIAGNOSIS — J449 Chronic obstructive pulmonary disease, unspecified: Secondary | ICD-10-CM | POA: Diagnosis not present

## 2017-08-18 DIAGNOSIS — M1990 Unspecified osteoarthritis, unspecified site: Secondary | ICD-10-CM

## 2017-08-18 DIAGNOSIS — R911 Solitary pulmonary nodule: Secondary | ICD-10-CM | POA: Diagnosis not present

## 2017-08-18 DIAGNOSIS — M810 Age-related osteoporosis without current pathological fracture: Secondary | ICD-10-CM | POA: Diagnosis not present

## 2017-08-18 DIAGNOSIS — D472 Monoclonal gammopathy: Secondary | ICD-10-CM | POA: Diagnosis not present

## 2017-08-18 DIAGNOSIS — N183 Chronic kidney disease, stage 3 (moderate): Secondary | ICD-10-CM | POA: Diagnosis not present

## 2017-08-18 DIAGNOSIS — Z7189 Other specified counseling: Secondary | ICD-10-CM | POA: Insufficient documentation

## 2017-08-18 DIAGNOSIS — G2581 Restless legs syndrome: Secondary | ICD-10-CM

## 2017-08-18 DIAGNOSIS — Z7982 Long term (current) use of aspirin: Secondary | ICD-10-CM

## 2017-08-18 DIAGNOSIS — Z79899 Other long term (current) drug therapy: Secondary | ICD-10-CM

## 2017-08-18 DIAGNOSIS — Z87891 Personal history of nicotine dependence: Secondary | ICD-10-CM

## 2017-08-18 NOTE — Progress Notes (Signed)
[facility] day:  08/18/2017  Chief Complaint: Bailey Ayala is a 77 y.o. female with smoldering myeloma who is seen for review of interval PET scan and discussion regarding direction of therapy.  HPI:  The patient was last seen in the medical oncology clinic on 08/08/2017.  At that time, she felt better after recent change in her thyroid medication.  Appetite was "ok".  She had lost 2 pounds.  Bone marrow revealed 13% plasmacytosis.  She had no CRAB criteria.  We discussed PET scan.  PET scan on 08/14/2017 revealed no hypermetabolic lesions of myeloma.  There was a 3.6 cm infrarenal aortic aneurysm.  Ultrasound in 2 years was recommended.  She had a 4 mm LLL nodule too small to characterize.  Symptomatically, she notes no new symptoms.  She denies pain or interval infection.   Past Medical History:  Diagnosis Date  . Arthritis   . Blood dyscrasia    BLEEDS EASILY   . COPD (chronic obstructive pulmonary disease) ([place])   . Diabetes mellitus without complication ([place])   . Diffuse cystic mastopathy   . Hyperlipidemia   . Hypertension   . Hypothyroidism   . L4-L5 disc bulge   . Osteoporosis, post-menopausal   . PAD (peripheral artery disease) ([place])   . Shortness of breath dyspnea    WITH EXERTION   . Stroke ([place])    TIA     2016     WAS FOUND ON SCAN ORDERED FROM NEUROL  . Thyroid disease   . TIA (transient ischemic attack) 2016   left hand weakness    Past Surgical History:  Procedure Laterality Date  . ABDOMINAL HYSTERECTOMY  1990  . BACK SURGERY  2011  . BREAST EXCISIONAL BIOPSY Left    2010?   Marland Kitchen CATARACT EXTRACTION, BILATERAL    . COLONOSCOPY  1998  . EYE SURGERY Bilateral 05/06/14  . JOINT REPLACEMENT     LT KNEE  . KNEE SURGERY Left 2012  . OOPHORECTOMY  1990  . parathyroid transplant     2/3 THYROID REMOVED   (GOITER)  . SPINE SURGERY    . THYROID SURGERY    . TONSILLECTOMY    . TOTAL SHOULDER ARTHROPLASTY Left  01/14/2016   Procedure: LEFT TOTAL SHOULDER ARTHROPLASTY;  Surgeon: Justice Britain, MD;  Location: [facility];  Service: Orthopedics;  Laterality: Left;  . TOTAL SHOULDER ARTHROPLASTY Right 11/03/2016   Procedure: RIGHT TOTAL SHOULDER ARTHROPLASTY;  Surgeon: Justice Britain, MD;  Location: [facility];  Service: Orthopedics;  Laterality: Right;  . ULNAR NERVE REPAIR     LEFT  . vein closure procedure Left 2010    Family History  Problem Relation Age of Onset  . Heart disease Mother        MI  . Arthritis Mother   . Stroke Mother   . Hyperlipidemia Mother   . Heart disease Father        MI  . Hyperlipidemia Father   . Diabetes Sister   . Hyperlipidemia Brother     Social History:  reports that she quit smoking about 27 years ago. She has a 75.00 pack-year smoking history. She has never used smokeless tobacco. She reports that she drinks about 1.2 oz of alcohol per week. She reports that she does not use drugs.  Patient is a former 3 pack per day smoker for 25 years (75 pack year). Patient lives in [place]. She is a former Electrical engineer. Patient  denies known exposures to radiation on toxins. The patient is accompanied by her daughter today.   Allergies:  Allergies  Allergen Reactions  . Codeine Other (See Comments)    hallucinations    . Simvastatin Diarrhea and Nausea Only    Current Medications: Current Outpatient Medications  Medication Sig Dispense Refill  . aspirin EC 81 MG tablet Take 1 tablet (81 mg total) by mouth daily. 90 tablet 3  . atorvastatin (LIPITOR) 20 MG tablet Take 1 tablet (20 mg total) by mouth daily. 90 tablet 4  . benazepril (LOTENSIN) 20 MG tablet Take 1 tablet (20 mg total) by mouth daily. 90 tablet 4  . levothyroxine (SYNTHROID, LEVOTHROID) 100 MCG tablet Take 1 tablet (100 mcg total) by mouth daily. 90 tablet 3  . Multiple Vitamins-Minerals (CENTRUM SILVER ULTRA WOMENS PO) Take 1 tablet by mouth daily.      No current facility-administered medications for this visit.      Review of Systems:  GENERAL:  Feels good.  No fevers, sweats.  Weight loss of 1 pound. PERFORMANCE STATUS (ECOG):  1 HEENT:  No visual changes, runny nose, sore throat, mouth sores or tenderness. Lungs: COPD.  Chronic non-productive cough.  No shortness of breath.  No hemoptysis. Cardiac:  No chest pain, palpitations, orthopnea, or PND. GI:  Loose stools 4-5 x/day x years.  No nausea, vomiting, constipation, melena or hematochezia.  Declines colonoscopy. GU:  No urgency, frequency, dysuria, or hematuria. Musculoskeletal:  Osteoarthritis s/p 2 shoulder and 1 knee replacement. No back pain.  No muscle tenderness. Extremities:  No pain or swelling. Skin:  Dry.  No rashes or skin changes. Neuro:  No headache, numbness or weakness, balance or coordination issues. Endocrine:  No diabetes.  Thyroid disease on Synthroid.  No hot flashes or night sweats. Psych:  No mood changes, depression or anxiety. Pain:  No focal pain. Review of systems:  All other systems reviewed and found to be negative.    Physical Exam: Blood pressure 125/73, pulse 64, temperature (!) 96.4 F (35.8 C), temperature source Tympanic, weight 176 lb 3 oz (79.9 kg). GENERAL:  Well developed, well nourished, woman sitting comfortably in the exam room in no acute distress. MENTAL STATUS:  Alert and oriented to person, place and time. HEAD:  Long dark brown hair.  Normocephalic, atraumatic, face symmetric, no Cushingoid features. EYES:  Blue eyes. No conjunctivitis or scleral icterus. NEUROLOGICAL: Unremarkable. PSYCH:  Appropriate.    No visits with results within 3 Day(s) from this visit.  Latest known visit with results is:  Hospital Outpatient Visit on 08/14/2017  Component Date Value Ref Range Status  . Glucose-Capillary 08/14/2017 98  65 - 99 mg/dL Final    Assessment:  Bailey Ayala is a 77 y.o. female with smoldering multiple myeloma.  SPEP on 06/21/2017 revealed a 1.2 gm/dL monoclonal spike.  Random  urine revealed 4.6 mg/dL protein and a 40.6% M-spike.  Calcium, albumen, and serum protein were normal.  Work-up on 06/30/2017 revealed a hematocrit of 31.1, hemoglobin 10.4, MCV 97.4, platelets 227,000, WBC 5700 with an ANC of 3300.  SPEP revealed a 1.1 gm/dL IgG monoclonal protein with lambda light chain specificity and monoclonal free lambda light chains (Bence-Jones protein).  IgG was 1660 ([phone]) and IgA was 48 (64-422).  Kappa free light chains were 23.7, lambda free light chains were 142.2 and ratio 0.17 (0.26-1.65).  Beta2-microglobulin was 4.7 (0.6-2.4).  Normal studies included:  B12, folate, and ferritin (75).  TSH was 0.204 (low).  24 hour UPEP revealed kappa free light chains 28.80 mg/L, lambda free light chains 63.80 mg/L and ration 0.45 (2.04 - 10.37).  M spike was 27.4% (16 mg/24 hours).  Bone survey on 06/30/2017 revealed no suspicious focal bony lesions.  Bone marrow aspirate and biopsy on 07/26/2017 revealed a slightly hypercellular marrow for age with trilineage hematopoiesis.  There was 13% plasmacytosis.  IHC stains for CD138 and in situ hybridization for kappa and lambda revealed interstitial cells and small clusters.  Lambda light chains appeared restricted.  Features were felt compatible with plasma cell neoplasm.  PET scan on 08/14/2017 revealed no hypermetabolic lesions of myeloma.  There was a 3.6 cm infrarenal aortic aneurysm.  Ultrasound in 2 years was recommended.  She had a 4 mm LLL nodule too small to characterize.  She has stage III chronic kidney disease (Cr 1.47; CrCl 34-40 ml/min).     She has a normocytic anemia.  Iron studies were normal on 06/21/2017.  Appetite is poor.  Last colonoscopy was > 10 years ago (she declines).  She has a 75 pack year smoking history.  She has COPD.  Chest CT without contrast on 07/25/2017 revealed no suspicious pulmonary nodules.  Symptomatically, she has improved overall since thyroid replacement medication was changed.   Appetite "ok", She has lost 2 pounds. Patient denies B symptoms or recent infections. Exam is unremarkable.  Plan: 1.  Discuss PET scan- no evidence of myeloma.  Discuss plan for surveillance. 2.  Discuss incidental findings of LLL nodule and aortic aneurysm.  Discuss follow-up non-contrast chest CT in 6 months and abdominal ultrasound in 2 years. 3.  RTC in 3 months for MD assessment, labs (CBC with diff, CMP, SPEP, IgG).   Honor Loh, NP 08/18/2017, 4:35 PM   I saw and evaluated the patient, participating in the key portions of the service and reviewing pertinent diagnostic studies and records.  I reviewed the nurse practitioner's note and agree with the findings and the plan.  The assessment and plan were discussed with the patient.  Several questions were asked by the patient and answered.   Nolon Stalls, MD 08/18/2017, 4:35 PM

## 2017-08-18 NOTE — Progress Notes (Signed)
Patient here today for PET results.  Offers no complaints today.

## 2017-09-05 DIAGNOSIS — M542 Cervicalgia: Secondary | ICD-10-CM | POA: Insufficient documentation

## 2017-09-05 DIAGNOSIS — M47812 Spondylosis without myelopathy or radiculopathy, cervical region: Secondary | ICD-10-CM | POA: Diagnosis not present

## 2017-09-19 ENCOUNTER — Telehealth: Payer: Self-pay | Admitting: Hematology and Oncology

## 2017-09-19 ENCOUNTER — Telehealth: Payer: Self-pay | Admitting: *Deleted

## 2017-09-19 NOTE — Telephone Encounter (Signed)
Called patient back regarding mammogram results. She states she called earlier because she had received a [employer], but it was a mistake. She also states that [place] provided clarification.

## 2017-09-19 NOTE — Telephone Encounter (Signed)
Patient has questions regarding her mammogram results and would like a return call to discuss them.

## 2017-09-23 DIAGNOSIS — D649 Anemia, unspecified: Secondary | ICD-10-CM

## 2017-09-23 HISTORY — DX: Anemia, unspecified: D64.9

## 2017-10-16 ENCOUNTER — Encounter: Payer: Self-pay | Admitting: Family Medicine

## 2017-11-14 ENCOUNTER — Inpatient Hospital Stay (HOSPITAL_BASED_OUTPATIENT_CLINIC_OR_DEPARTMENT_OTHER): Payer: Medicare Other | Admitting: Urgent Care

## 2017-11-14 ENCOUNTER — Inpatient Hospital Stay: Payer: Medicare Other | Attending: Hematology and Oncology

## 2017-11-14 ENCOUNTER — Other Ambulatory Visit: Payer: Self-pay

## 2017-11-14 VITALS — BP 124/77 | HR 83 | Temp 97.0°F | Resp 18 | Wt 176.1 lb

## 2017-11-14 DIAGNOSIS — C9000 Multiple myeloma not having achieved remission: Secondary | ICD-10-CM | POA: Insufficient documentation

## 2017-11-14 DIAGNOSIS — Z8673 Personal history of transient ischemic attack (TIA), and cerebral infarction without residual deficits: Secondary | ICD-10-CM | POA: Diagnosis not present

## 2017-11-14 DIAGNOSIS — M1990 Unspecified osteoarthritis, unspecified site: Secondary | ICD-10-CM | POA: Diagnosis not present

## 2017-11-14 DIAGNOSIS — E1122 Type 2 diabetes mellitus with diabetic chronic kidney disease: Secondary | ICD-10-CM | POA: Diagnosis not present

## 2017-11-14 DIAGNOSIS — J449 Chronic obstructive pulmonary disease, unspecified: Secondary | ICD-10-CM | POA: Insufficient documentation

## 2017-11-14 DIAGNOSIS — R911 Solitary pulmonary nodule: Secondary | ICD-10-CM | POA: Insufficient documentation

## 2017-11-14 DIAGNOSIS — I129 Hypertensive chronic kidney disease with stage 1 through stage 4 chronic kidney disease, or unspecified chronic kidney disease: Secondary | ICD-10-CM | POA: Diagnosis not present

## 2017-11-14 DIAGNOSIS — D472 Monoclonal gammopathy: Secondary | ICD-10-CM

## 2017-11-14 DIAGNOSIS — I719 Aortic aneurysm of unspecified site, without rupture: Secondary | ICD-10-CM

## 2017-11-14 DIAGNOSIS — N183 Chronic kidney disease, stage 3 (moderate): Secondary | ICD-10-CM

## 2017-11-14 DIAGNOSIS — E039 Hypothyroidism, unspecified: Secondary | ICD-10-CM | POA: Diagnosis not present

## 2017-11-14 DIAGNOSIS — D759 Disease of blood and blood-forming organs, unspecified: Secondary | ICD-10-CM | POA: Insufficient documentation

## 2017-11-14 DIAGNOSIS — Z7982 Long term (current) use of aspirin: Secondary | ICD-10-CM | POA: Insufficient documentation

## 2017-11-14 DIAGNOSIS — Z87891 Personal history of nicotine dependence: Secondary | ICD-10-CM | POA: Diagnosis not present

## 2017-11-14 DIAGNOSIS — M810 Age-related osteoporosis without current pathological fracture: Secondary | ICD-10-CM | POA: Insufficient documentation

## 2017-11-14 DIAGNOSIS — Z79899 Other long term (current) drug therapy: Secondary | ICD-10-CM

## 2017-11-14 LAB — COMPREHENSIVE METABOLIC PANEL
ALT: 15 U/L (ref 0–44)
AST: 22 U/L (ref 15–41)
Albumin: 4 g/dL (ref 3.5–5.0)
Alkaline Phosphatase: 76 U/L (ref 38–126)
Anion gap: 8 (ref 5–15)
BUN: 23 mg/dL (ref 8–23)
CO2: 22 mmol/L (ref 22–32)
Calcium: 9 mg/dL (ref 8.9–10.3)
Chloride: 106 mmol/L (ref 98–111)
Creatinine, Ser: 1.55 mg/dL — ABNORMAL HIGH (ref 0.44–1.00)
GFR calc Af Amer: 36 mL/min — ABNORMAL LOW (ref >60)
GFR calc non Af Amer: 31 mL/min — ABNORMAL LOW (ref >60)
Glucose, Bld: 96 mg/dL (ref 70–99)
Potassium: 4.4 mmol/L (ref 3.5–5.1)
Sodium: 136 mmol/L (ref 135–145)
Total Bilirubin: 0.6 mg/dL (ref 0.3–1.2)
Total Protein: 7.9 g/dL (ref 6.5–8.1)

## 2017-11-14 LAB — CBC WITH DIFFERENTIAL/PLATELET
Basophils Absolute: 0 10*3/uL (ref 0–0.1)
Basophils Relative: 1 %
Eosinophils Absolute: 0.3 10*3/uL (ref 0–0.7)
Eosinophils Relative: 5 %
HCT: 29.7 % — ABNORMAL LOW (ref 35.0–47.0)
Hemoglobin: 10 g/dL — ABNORMAL LOW (ref 12.0–16.0)
Lymphocytes Relative: 23 %
Lymphs Abs: 1.2 10*3/uL (ref 1.0–3.6)
MCH: 33.9 pg (ref 26.0–34.0)
MCHC: 33.8 g/dL (ref 32.0–36.0)
MCV: 100.2 fL — ABNORMAL HIGH (ref 80.0–100.0)
Monocytes Absolute: 0.4 10*3/uL (ref 0.2–0.9)
Monocytes Relative: 7 %
Neutro Abs: 3.5 10*3/uL (ref 1.4–6.5)
Neutrophils Relative %: 64 %
Platelets: 237 10*3/uL (ref 150–440)
RBC: 2.96 MIL/uL — ABNORMAL LOW (ref 3.80–5.20)
RDW: 14.5 % (ref 11.5–14.5)
WBC: 5.4 10*3/uL (ref 3.6–11.0)

## 2017-11-14 NOTE — Progress Notes (Signed)
Patient offers no complaints today. 

## 2017-11-14 NOTE — Progress Notes (Signed)
[facility] day:  08/18/2017  Chief Complaint: Bailey Ayala is a 77 y.o. female with smoldering myeloma who is seen for 3 month assessment.   HPI:  The patient was last seen in the medical oncology clinic on 08/18/2017.  At that time, patient was doing well. She denies any acute complaints. No B symptoms or recent infections. Patient notes that she had improved overall with recent thyroid medication change. PET scan was review; no evidence of myeloma. Exam was grossly unremarkable.   In the interim, patient is doing well. Her energy level and overall well being has continued to improve with the change in her thyroid medications. She denies any acute symptoms. She has not appreciated any new areas of palpable adenopathy. Patient denies bleeding; no hematochezia, melena, or gross hematuria. Patient notes that her hands bruise easily with minimal blunt force trauma.   Patient denies that she has experienced any B symptoms. She denies any interval infections. She has a chronic COPD related cough and mild exertional dyspnea. Patient advises that she maintains an adequate appetite. She is only eating 1 meal per day per family member. Weight today is 176 lb 1 oz (79.9 kg), which compared to her last visit to the clinic, represents a stable weight.   Patient denies pain in the clinic today.  Past Medical History:  Diagnosis Date  . Arthritis   . Blood dyscrasia    BLEEDS EASILY   . COPD (chronic obstructive pulmonary disease) ([place])   . Diabetes mellitus without complication ([place])   . Diffuse cystic mastopathy   . Hyperlipidemia   . Hypertension   . Hypothyroidism   . L4-L5 disc bulge   . Osteoporosis, post-menopausal   . PAD (peripheral artery disease) ([place])   . Shortness of breath dyspnea    WITH EXERTION   . Stroke ([place])    TIA     2016     WAS FOUND ON SCAN ORDERED FROM NEUROL  . Thyroid disease   . TIA (transient ischemic attack) 2016   left hand weakness    Past Surgical History:  Procedure Laterality Date  . ABDOMINAL HYSTERECTOMY  1990  . BACK SURGERY  2011  . BREAST EXCISIONAL BIOPSY Left    2010?   Marland Kitchen CATARACT EXTRACTION, BILATERAL    . COLONOSCOPY  1998  . EYE SURGERY Bilateral 05/06/14  . JOINT REPLACEMENT     LT KNEE  . KNEE SURGERY Left 2012  . OOPHORECTOMY  1990  . parathyroid transplant     2/3 THYROID REMOVED   (GOITER)  . SPINE SURGERY    . THYROID SURGERY    . TONSILLECTOMY    . TOTAL SHOULDER ARTHROPLASTY Left 01/14/2016   Procedure: LEFT TOTAL SHOULDER ARTHROPLASTY;  Surgeon: Justice Britain, MD;  Location: [facility];  Service: Orthopedics;  Laterality: Left;  . TOTAL SHOULDER ARTHROPLASTY Right 11/03/2016   Procedure: RIGHT TOTAL SHOULDER ARTHROPLASTY;  Surgeon: Justice Britain, MD;  Location: [facility];  Service: Orthopedics;  Laterality: Right;  . ULNAR NERVE REPAIR     LEFT  . vein closure procedure Left 2010    Family History  Problem Relation Age of Onset  . Heart disease Mother        MI  . Arthritis Mother   . Stroke Mother   . Hyperlipidemia Mother   . Heart disease Father        MI  . Hyperlipidemia Father   . Diabetes Sister   .  Hyperlipidemia Brother     Social History:  reports that she quit smoking about 28 years ago. She has a 75.00 pack-year smoking history. She has never used smokeless tobacco. She reports that she drinks about 1.2 oz of alcohol per week. She reports that she does not use drugs.  Patient is a former 3 pack per day smoker for 25 years (75 pack year). Patient lives in [place]. She is a former Electrical engineer. Patient denies known exposures to radiation on toxins. The patient is accompanied by her daughter today.   Allergies:  Allergies  Allergen Reactions  . Codeine Other (See Comments)    hallucinations    . Simvastatin Diarrhea and Nausea Only    Current Medications: Current Outpatient Medications  Medication Sig Dispense Refill  . aspirin EC 81 MG tablet Take 1  tablet (81 mg total) by mouth daily. 90 tablet 3  . atorvastatin (LIPITOR) 20 MG tablet Take 1 tablet (20 mg total) by mouth daily. 90 tablet 4  . benazepril (LOTENSIN) 20 MG tablet Take 1 tablet (20 mg total) by mouth daily. 90 tablet 4  . levothyroxine (SYNTHROID, LEVOTHROID) 100 MCG tablet Take 1 tablet (100 mcg total) by mouth daily. 90 tablet 3  . Multiple Vitamins-Minerals (CENTRUM SILVER ULTRA WOMENS PO) Take 1 tablet by mouth daily.      No current facility-administered medications for this visit.     Review of Systems  Constitutional: Negative for diaphoresis, fever, malaise/fatigue (improved) and weight loss (stable).       "I am feeling better".  HENT: Negative.   Eyes: Negative.   Respiratory: Positive for cough (chronic - related to COPD) and shortness of breath (exertional). Negative for hemoptysis and sputum production.   Cardiovascular: Negative for chest pain, palpitations, orthopnea, leg swelling and PND.  Gastrointestinal: Negative for abdominal pain, blood in stool, constipation, diarrhea, melena, nausea and vomiting.       Loose stools 4-5 times daily for "years". Refuses colonoscopy.   Genitourinary: Negative for dysuria, frequency, hematuria and urgency.  Musculoskeletal: Positive for joint pain (arthritis). Negative for back pain, falls and myalgias.  Skin: Negative for itching and rash.  Neurological: Negative for dizziness, tremors, weakness and headaches.  Endo/Heme/Allergies: Does not bruise/bleed easily.       Hypothyroidism on levothyroxine  Psychiatric/Behavioral: Negative for depression, memory loss and suicidal ideas. The patient is not nervous/anxious and does not have insomnia.   All other systems reviewed and are negative.  Performance status (ECOG): 1 - Symptomatic but completely ambulatory  Vital Signs BP 124/77 (BP Location: Left Arm, Patient Position: Sitting)   Pulse 83   Temp (!) 97 F (36.1 C) (Tympanic)   Resp 18   Wt 176 lb 1 oz (79.9  kg)   BMI 29.30 kg/m   Physical Exam  Constitutional: She is oriented to person, place, and time and well-developed, well-nourished, and in no distress.  HENT:  Head: Normocephalic and atraumatic.  Long brown hair  Eyes: Pupils are equal, round, and reactive to light. EOM are normal. No scleral icterus.  Blue eyes  Neck: Normal range of motion. Neck supple. No tracheal deviation present. No thyromegaly present.  Cardiovascular: Normal rate, regular rhythm and normal heart sounds. Exam reveals no gallop and no friction rub.  No murmur heard. Pulmonary/Chest: Effort normal and breath sounds normal. No respiratory distress. She has no wheezes. She has no rales.  Abdominal: Soft. Bowel sounds are normal. She exhibits no distension. There is no tenderness.  Musculoskeletal:  Normal range of motion. She exhibits no edema or tenderness.  Lymphadenopathy:    She has no cervical adenopathy.    She has no axillary adenopathy.       Right: No inguinal and no supraclavicular adenopathy present.       Left: No inguinal and no supraclavicular adenopathy present.  Neurological: She is alert and oriented to person, place, and time.  Skin: Skin is warm and dry. No rash noted. No erythema.  Psychiatric: Mood, affect and judgment normal.  Nursing note and vitals reviewed.   No visits with results within 3 Day(s) from this visit.  Latest known visit with results is:  Hospital Outpatient Visit on 08/14/2017  Component Date Value Ref Range Status  . Glucose-Capillary 08/14/2017 98  65 - 99 mg/dL Final    Assessment:  LELAH RENNAKER is a 77 y.o. female with smoldering multiple myeloma.  SPEP on 06/21/2017 revealed a 1.2 gm/dL monoclonal spike.  Random urine revealed 4.6 mg/dL protein and a 40.6% M-spike.  Calcium, albumin, and serum protein were normal.  Work-up on 06/30/2017 revealed a hematocrit of 31.1, hemoglobin 10.4, MCV 97.4, platelets 227,000, WBC 5700 with an ANC of 3300.  SPEP revealed a  1.1 gm/dL IgG monoclonal protein with lambda light chain specificity and monoclonal free lambda light chains (Bence-Jones protein).  IgG was 1660 ([phone]) and IgA was 48 (64-422).  Kappa free light chains were 23.7, lambda free light chains were 142.2 and ratio 0.17 (0.26-1.65).  Beta2-microglobulin was 4.7 (0.6-2.4).  Normal studies included:  B12, folate, and ferritin (75).  TSH was 0.204 (low).  24 hour UPEP revealed kappa free light chains 28.80 mg/L, lambda free light chains 63.80 mg/L and ration 0.45 (2.04 - 10.37).  M spike was 27.4% (16 mg/24 hours).  Bone survey on 06/30/2017 revealed no suspicious focal bony lesions.  Bone marrow aspirate and biopsy on 07/26/2017 revealed a slightly hypercellular marrow for age with trilineage hematopoiesis.  There was 13% plasmacytosis.  IHC stains for CD138 and in situ hybridization for kappa and lambda revealed interstitial cells and small clusters.  Lambda light chains appeared restricted.  Features were felt compatible with plasma cell neoplasm.  PET scan on 08/14/2017 revealed no hypermetabolic lesions of myeloma.  There was a 3.6 cm infrarenal aortic aneurysm.  Ultrasound in 2 years was recommended.  She had a 4 mm LLL nodule too small to characterize.  She has stage III chronic kidney disease (Cr 1.55; CrCl 32.3 ml/min).     She has a normocytic anemia.  Iron studies were normal on 06/21/2017.  Appetite is poor.  Last colonoscopy was > 10 years ago (she declines).  She has a 75 pack year smoking history.  She has COPD.  Chest CT without contrast on 07/25/2017 revealed no suspicious pulmonary nodules.  Symptomatically, patient is doing well. She notes that her energy level continues to improve with the change in her thyroid regimen. Appetite is labile. She is only eating one meal a day per family member. Weight stable. Patient denies B symptoms or recent infections. Exam is unremarkable.  WBC 5400 with an ANC of 3500.  Hemoglobin 10, hematocrit 29.7,  MCV 100.2, and platelets 237,000.  BUN 23 and creatinine 1.55 (CrCl 32.3 mL/min).  M spike 1.2.  IgG 1684.  Plan: 1. Labs today: CBC with differential, CMP, SPEP, IgG 2. Review plans for interval imaging  Previous CT imaging made note of a  LLL nodule and aortic aneurysm.    Discuss follow-up non-contrast  chest CT just prior to next visit (due 01/24/2018).   Review plans for abdominal ultrasound in 2 years (due in 07/2019).  3.  RTC in 3 months for MD assessment, labs (CBC with diff, CMP, SPEP, IgG), and review of CT imaging.   Honor Loh, NP 11/14/17, 12:04 PM

## 2017-11-15 LAB — PROTEIN ELECTROPHORESIS, SERUM
A/G Ratio: 1 (ref 0.7–1.7)
Albumin ELP: 3.7 g/dL (ref 2.9–4.4)
Alpha-1-Globulin: 0.3 g/dL (ref 0.0–0.4)
Alpha-2-Globulin: 1.1 g/dL — ABNORMAL HIGH (ref 0.4–1.0)
Beta Globulin: 1 g/dL (ref 0.7–1.3)
Gamma Globulin: 1.4 g/dL (ref 0.4–1.8)
Globulin, Total: 3.7 g/dL (ref 2.2–3.9)
M-Spike, %: 1.2 g/dL — ABNORMAL HIGH
Total Protein ELP: 7.4 g/dL (ref 6.0–8.5)

## 2017-11-15 LAB — IGG: IgG (Immunoglobin G), Serum: 1684 mg/dL — ABNORMAL HIGH (ref 700–1600)

## 2017-11-20 ENCOUNTER — Encounter: Payer: Self-pay | Admitting: Urgent Care

## 2017-11-22 ENCOUNTER — Ambulatory Visit (INDEPENDENT_AMBULATORY_CARE_PROVIDER_SITE_OTHER): Payer: Medicare Other

## 2017-11-22 VITALS — BP 126/60 | HR 88 | Temp 97.8°F | Resp 17 | Ht 66.0 in | Wt 174.2 lb

## 2017-11-22 DIAGNOSIS — Z0000 Encounter for general adult medical examination without abnormal findings: Secondary | ICD-10-CM

## 2017-11-22 DIAGNOSIS — Z23 Encounter for immunization: Secondary | ICD-10-CM

## 2017-11-22 DIAGNOSIS — S91011A Laceration without foreign body, right ankle, initial encounter: Secondary | ICD-10-CM | POA: Diagnosis not present

## 2017-11-22 NOTE — Patient Instructions (Addendum)
Bailey Ayala , Thank you for taking time to come for yourMedicare Wellness Visit. I appreciate your ongoing commitment to your health goals. Please review the following plan we discussed and let me know if I can assist you in the future.   Screening recommendations/referrals: Colonoscopy: No longer required Mammogram: completed 09/20/2017 Bone Density: completed 02/24/2009 Recommended yearly ophthalmology/optometry visit for glaucoma screening and checkup Recommended yearly dental visit for hygiene and checkup  Vaccinations: Influenza vaccine: up to date, due 12/2017 Pneumococcal vaccine: up to date Tdap vaccine: up to date  Shingles vaccine: shingrix eligible, check with your insurance company for coverage   Advanced directives: copy on file   Conditions/risks identified: Recommend drinking at least 6-8 glasses of water a day  Next appointment: Follow up in one year for your annual wellness exam.     Preventive Care 76 Years and Older, Female Preventive care refers to lifestyle choices and visits with your health care provider that can promote health and wellness. What does preventive care include?  A yearly physical exam. This is also called an annual well check.  Dental exams once or twice a year.  Routine eye exams. Ask your health care provider how often you should have your eyes checked.  Personal lifestyle choices, including:  Daily care of your teeth and gums.  Regular physical activity.  Eating a healthy diet.  Avoiding tobacco and drug use.  Limiting alcohol use.  Practicing safe sex.  Taking low-dose aspirin every day.  Taking vitamin and mineral supplements as recommended by your health care provider. What happens during an annual well check? The services and screenings done by your health care provider during your annual well check will depend on your age, overall health, lifestyle risk factors, and family history of disease. Counseling  Your  health care provider may ask you questions about your:  Alcohol use.  Tobacco use.  Drug use.  Emotional well-being.  Home and relationship well-being.  Sexual activity.  Eating habits.  History of falls.  Memory and ability to understand (cognition).  Work and work Statistician.  Reproductive health. Screening  You may have the following tests or measurements:  Height, weight, and BMI.  Blood pressure.  Lipid and cholesterol levels. These may be checked every 5 years, or more frequently if you are over 25 years old.  Skin check.  Lung cancer screening. You may have this screening every year starting at age 76 if you have a 30-pack-year history of smoking and currently smoke or have quit within the past 15 years.  Fecal occult blood test (FOBT) of the stool. You may have this test every year starting at age 76.  Flexible sigmoidoscopy or colonoscopy. You may have a sigmoidoscopy every 5 years or a colonoscopy every 10 years starting at age 76.  Hepatitis C blood test.  Hepatitis B blood test.  Sexually transmitted disease (STD) testing.  Diabetes screening. This is done by checking your blood sugar (glucose) after you have not eaten for a while (fasting). You may have this done every 1-3 years.  Bone density scan. This is done to screen for osteoporosis. You may have this done starting at age 76.  Mammogram. This may be done every 1-2 years. Talk to your health care provider about how often you should have regular mammograms. Talk with your health care provider about your test results, treatment options, and if necessary, the need for more tests. Vaccines  Your health care provider may recommend certain vaccines, such as:  Influenza vaccine. This is recommended every year.  Tetanus, diphtheria, and acellular pertussis (Tdap, Td) vaccine. You may need a Td booster every 10 years.  Zoster vaccine. You may need this after age 55.  Pneumococcal 13-valent  conjugate (PCV13) vaccine. One dose is recommended after age 76.  Pneumococcal polysaccharide (PPSV23) vaccine. One dose is recommended after age 76. Talk to your health care provider about which screenings and vaccines you need and how often you need them. This information is not intended to replace advice given to you by your health care provider. Make sure you discuss any questions you have with your health care provider. Document Released: 05/08/2015 Document Revised: 12/30/2015 Document Reviewed: 02/10/2015 Elsevier Interactive Patient Education  2017 [place] Prevention in the Home Falls can cause injuries. They can happen to people of all ages. There are many things you can do to make your home safe and to help prevent falls. What can I do on the outside of my home?  Regularly fix the edges of walkways and driveways and fix any cracks.  Remove anything that might make you trip as you walk through a door, such as a raised step or threshold.  Trim any bushes or trees on the path to your home.  Use bright outdoor lighting.  Clear any walking paths of anything that might make someone trip, such as rocks or tools.  Regularly check to see if handrails are loose or broken. Make sure that both sides of any steps have handrails.  Any raised decks and porches should have guardrails on the edges.  Have any leaves, snow, or ice cleared regularly.  Use sand or salt on walking paths during winter.  Clean up any spills in your garage right away. This includes oil or grease spills. What can I do in the bathroom?  Use night lights.  Install grab bars by the toilet and in the tub and shower. Do not use towel bars as grab bars.  Use non-skid mats or decals in the tub or shower.  If you need to sit down in the shower, use a plastic, non-slip stool.  Keep the floor dry. Clean up any water that spills on the floor as soon as it happens.  Remove soap buildup in the tub or  shower regularly.  Attach bath mats securely with double-sided non-slip rug tape.  Do not have throw rugs and other things on the floor that can make you trip. What can I do in the bedroom?  Use night lights.  Make sure that you have a light by your bed that is easy to reach.  Do not use any sheets or blankets that are too big for your bed. They should not hang down onto the floor.  Have a firm chair that has side arms. You can use this for support while you get dressed.  Do not have throw rugs and other things on the floor that can make you trip. What can I do in the kitchen?  Clean up any spills right away.  Avoid walking on wet floors.  Keep items that you use a lot in easy-to-reach places.  If you need to reach something above you, use a strong step stool that has a grab bar.  Keep electrical cords out of the way.  Do not use floor polish or wax that makes floors slippery. If you must use wax, use non-skid floor wax.  Do not have throw rugs and other things on the floor that  can make you trip. What can I do with my stairs?  Do not leave any items on the stairs.  Make sure that there are handrails on both sides of the stairs and use them. Fix handrails that are broken or loose. Make sure that handrails are as long as the stairways.  Check any carpeting to make sure that it is firmly attached to the stairs. Fix any carpet that is loose or worn.  Avoid having throw rugs at the top or bottom of the stairs. If you do have throw rugs, attach them to the floor with carpet tape.  Make sure that you have a light switch at the top of the stairs and the bottom of the stairs. If you do not have them, ask someone to add them for you. What else can I do to help prevent falls?  Wear shoes that:  Do not have high heels.  Have rubber bottoms.  Are comfortable and fit you well.  Are closed at the toe. Do not wear sandals.  If you use a stepladder:  Make sure that it is fully  opened. Do not climb a closed stepladder.  Make sure that both sides of the stepladder are locked into place.  Ask someone to hold it for you, if possible.  Clearly mark and make sure that you can see:  Any grab bars or handrails.  First and last steps.  Where the edge of each step is.  Use tools that help you move around (mobility aids) if they are needed. These include:  Canes.  Walkers.  Scooters.  Crutches.  Turn on the lights when you go into a dark area. Replace any light bulbs as soon as they burn out.  Set up your furniture so you have a clear path. Avoid moving your furniture around.  If any of your floors are uneven, fix them.  If there are any pets around you, be aware of where they are.  Review your medicines with your doctor. Some medicines can make you feel dizzy. This can increase your chance of falling. Ask your doctor what other things that you can do to help prevent falls. This information is not intended to replace advice given to you by your health care provider. Make sure you discuss any questions you have with your health care provider. Document Released: 02/05/2009 Document Revised: 09/17/2015 Document Reviewed: 05/16/2014 Elsevier Interactive Patient Education  2017 [employer].   Tdap Vaccine (Tetanus, Diphtheria and Pertussis): What You Need to Know 1. Why get vaccinated? Tetanus, diphtheria and pertussis are very serious diseases. Tdap vaccine can protect [place] from these diseases. And, Tdap vaccine given to pregnant women can protect newborn babies against pertussis. TETANUS (Lockjaw) is rare in the [place] today. It causes painful muscle tightening and stiffness, usually all over the body.  It can lead to tightening of muscles in the head and neck so you can't open your mouth, swallow, or sometimes even breathe. Tetanus kills about 1 out of 10 people who are infected even after receiving the best medical care.  DIPHTHERIA is also rare in  the [place] today. It can cause a thick coating to form in the back of the throat.  It can lead to breathing problems, heart failure, paralysis, and death.  PERTUSSIS (Whooping Cough) causes severe coughing spells, which can cause difficulty breathing, vomiting and disturbed sleep.  It can also lead to weight loss, incontinence, and rib fractures. Up to 2 in 100 adolescents and 5  in 100 adults with pertussis are hospitalized or have complications, which could include pneumonia or death.  These diseases are caused by bacteria. Diphtheria and pertussis are spread from person to person through secretions from coughing or sneezing. Tetanus enters the body through cuts, scratches, or wounds. Before vaccines, as many as 200,000 cases of diphtheria, 200,000 cases of pertussis, and hundreds of cases of tetanus, were reported in the [place] each year. Since vaccination began, reports of cases for tetanus and diphtheria have dropped by about 99% and for pertussis by about 80%. 2. Tdap vaccine Tdap vaccine can protect adolescents and adults from tetanus, diphtheria, and pertussis. One dose of Tdap is routinely given at age 31 or 27. People who did not get Tdap at that age should get it as soon as possible. Tdap is especially important for healthcare professionals and anyone having close contact with a baby younger than 12 months. Pregnant women should get a dose of Tdap during every pregnancy, to protect the newborn from pertussis. Infants are most at risk for severe, life-threatening complications from pertussis. Another vaccine, called Td, protects against tetanus and diphtheria, but not pertussis. A Td booster should be given every 10 years. Tdap may be given as one of these boosters if you have never gotten Tdap before. Tdap may also be given after a severe cut or burn to prevent tetanus infection. Your doctor or the person giving you the vaccine can give you more information. Tdap may safely be  given at the same time as other vaccines. 3. Some people should not get this vaccine  A person who has ever had a life-threatening allergic reaction after a previous dose of any diphtheria, tetanus or pertussis containing vaccine, OR has a severe allergy to any part of this vaccine, should not get Tdap vaccine. Tell the person giving the vaccine about any severe allergies.  Anyone who had coma or long repeated seizures within 7 days after a childhood dose of DTP or DTaP, or a previous dose of Tdap, should not get Tdap, unless a cause other than the vaccine was found. They can still get Td.  Talk to your doctor if you: ? have seizures or another nervous system problem, ? had severe pain or swelling after any vaccine containing diphtheria, tetanus or pertussis, ? ever had a condition called Guillain-Barr Syndrome (GBS), ? aren't feeling well on the day the shot is scheduled. 4. Risks With any medicine, including vaccines, there is a chance of side effects. These are usually mild and go away on their own. Serious reactions are also possible but are rare. Most people who get Tdap vaccine do not have any problems with it. Mild problems following Tdap: (Did not interfere with activities)  Pain where the shot was given (about 3 in 4 adolescents or 2 in 3 adults)  Redness or swelling where the shot was given (about 1 person in 5)  Mild fever of at least 100.54F (up to about 1 in 25 adolescents or 1 in 100 adults)  Headache (about 3 or 4 people in 10)  Tiredness (about 1 person in 3 or 4)  Nausea, vomiting, diarrhea, stomach ache (up to 1 in 4 adolescents or 1 in 10 adults)  Chills, sore joints (about 1 person in 10)  Body aches (about 1 person in 3 or 4)  Rash, swollen glands (uncommon)  Moderate problems following Tdap: (Interfered with activities, but did not require medical attention)  Pain where the shot was given (up to  1 in 5 or 6)  Redness or swelling where the shot was given  (up to about 1 in 16 adolescents or 1 in 12 adults)  Fever over 102F (about 1 in 100 adolescents or 1 in 250 adults)  Headache (about 1 in 7 adolescents or 1 in 10 adults)  Nausea, vomiting, diarrhea, stomach ache (up to 1 or 3 people in 100)  Swelling of the entire arm where the shot was given (up to about 1 in 500).  Severe problems following Tdap: (Unable to perform usual activities; required medical attention)  Swelling, severe pain, bleeding and redness in the arm where the shot was given (rare).  Problems that could happen after any vaccine:  People sometimes faint after a medical procedure, including vaccination. Sitting or lying down for about 15 minutes can help prevent fainting, and injuries caused by a fall. Tell your doctor if you feel dizzy, or have vision changes or ringing in the ears.  Some people get severe pain in the shoulder and have difficulty moving the arm where a shot was given. This happens very rarely.  Any medication can cause a severe allergic reaction. Such reactions from a vaccine are very rare, estimated at fewer than 1 in a million doses, and would happen within a few minutes to a few hours after the vaccination. As with any medicine, there is a very remote chance of a vaccine causing a serious injury or death. The safety of vaccines is always being monitored. For more information, visit: http://www.aguilar.org/ 5. What if there is a serious problem? What should I look for? Look for anything that concerns you, such as signs of a severe allergic reaction, very high fever, or unusual behavior. Signs of a severe allergic reaction can include hives, swelling of the face and throat, difficulty breathing, a fast heartbeat, dizziness, and weakness. These would usually start a few minutes to a few hours after the vaccination. What should I do?  If you think it is a severe allergic reaction or other emergency that can't wait, call 9-1-1 or get the person to  the nearest hospital. Otherwise, call your doctor.  Afterward, the reaction should be reported to the Vaccine Adverse Event Reporting System (VAERS). Your doctor might file this report, or you can do it yourself through the VAERS web site at www.vaers.SamedayNews.es, or by calling (579)266-7699. ? VAERS does not give medical advice. 6. The National Vaccine Injury Compensation Program The [employer] Vaccine Injury Compensation Program (VICP) is a federal program that was created to compensate people who may have been injured by certain vaccines. Persons who believe they may have been injured by a vaccine can learn about the program and about filing a claim by calling (680)082-1968 or visiting the [employer] website at GoldCloset.com.ee. There is a time limit to file a claim for compensation. 7. How can I learn more?  Ask your doctor. He or she can give you the vaccine package insert or suggest other sources of information.  Call your local or state health department.  Contact the Centers for Disease Control and Prevention (CDC): ? Call 812-155-1406 (1-800-CDC-INFO) or ? Visit CDC's website at http://hunter.com/ CDC Tdap Vaccine VIS (06/18/13) This information is not intended to replace advice given to you by your health care provider. Make sure you discuss any questions you have with your health care provider. Document Released: 10/11/2011 Document Revised: 12/31/2015 Document Reviewed: 12/31/2015 Elsevier Interactive Patient Education  2017 [employer].

## 2017-11-22 NOTE — Progress Notes (Signed)
Subjective:   Bailey Ayala is a 77 y.o. female who presents for Medicare Annual (Subsequent) preventive examination.  Review of Systems:   Cardiac Risk Factors include: advanced age (>37mn, >>31women);dyslipidemia;hypertension;smoking/ tobacco exposure     Objective:     Vitals: BP 126/60 (BP Location: Left Arm, Patient Position: Sitting)   Pulse 88   Temp 97.8 F (36.6 C) (Temporal)   Resp 17   Ht '5\' 6"'  (1.676 m)   Wt 174 lb 3.2 oz (79 kg)   BMI 28.12 kg/m   Body mass index is 28.12 kg/m.  Advanced Directives 11/22/2017 11/14/2017 08/18/2017 08/08/2017 07/26/2017 07/10/2017 06/30/2017  Does Patient Have a Medical Advance Directive? Yes Yes Yes Yes Yes Yes Yes  Type of Advance Directive Living will;Healthcare Power of [place] will Living will;Healthcare Power of Attorney -  Does patient want to make changes to medical advance directive? - - - - No - Patient declined No - Patient declined -  Copy of [place] Chart? Yes - - - Yes Yes -  Would patient like information on creating a medical advance directive? - - - - - - -    Tobacco Social History   Tobacco Use  Smoking Status Former Smoker  . Packs/day: 3.00  . Years: 25.00  . Pack years: 75.00  . Last attempt to quit: 11/03/1989  . Years since quitting: 28.0  Smokeless Tobacco Never Used     Counseling given: Not Answered   Clinical Intake:  Pre-visit preparation completed: Yes  Pain : 0-10 Pain Score: 3  Pain Type: Chronic pain Pain Location: Generalized Pain Descriptors / Indicators: Aching Pain Onset: More than a month ago Pain Frequency: Constant     Nutritional Status: BMI 25 -29 Overweight Nutritional Risks: None Diabetes: No  How often do you need to have someone help you when you read instructions, pamphlets, or other written materials from your doctor or pharmacy?: 1 - Never What is the last grade level you completed in school?:  high school   Interpreter Needed?: No  Information entered by :: Loyal Holzheimer,LPN  Past Medical History:  Diagnosis Date  . Arthritis   . Blood dyscrasia    BLEEDS EASILY   . COPD (chronic obstructive pulmonary disease) ([place]   . Diabetes mellitus without complication ([place]   . Diffuse cystic mastopathy   . Hyperlipidemia   . Hypertension   . Hypothyroidism   . L4-L5 disc bulge   . Multiple myeloma ([place]   . Osteoporosis, post-menopausal   . PAD (peripheral artery disease) ([place]   . Shortness of breath dyspnea    WITH EXERTION   . Stroke ([place]    TIA     2016     WAS FOUND ON SCAN ORDERED FROM NEUROL  . Thyroid disease   . TIA (transient ischemic attack) 2016   left hand weakness   Past Surgical History:  Procedure Laterality Date  . ABDOMINAL HYSTERECTOMY  1990  . BACK SURGERY  2011  . BREAST EXCISIONAL BIOPSY Left    2010?   .Marland KitchenCATARACT EXTRACTION, BILATERAL    . COLONOSCOPY  1998  . EYE SURGERY Bilateral 05/06/14  . JOINT REPLACEMENT     LT KNEE  . KNEE SURGERY Left 2012  . OOPHORECTOMY  1990  . parathyroid transplant     2/3 THYROID REMOVED   (GOITER)  . SPINE SURGERY    . THYROID SURGERY    .  TONSILLECTOMY    . TOTAL SHOULDER ARTHROPLASTY Left 01/14/2016   Procedure: LEFT TOTAL SHOULDER ARTHROPLASTY;  Surgeon: Justice Britain, MD;  Location: [facility];  Service: Orthopedics;  Laterality: Left;  . TOTAL SHOULDER ARTHROPLASTY Right 11/03/2016   Procedure: RIGHT TOTAL SHOULDER ARTHROPLASTY;  Surgeon: Justice Britain, MD;  Location: [place];  Service: Orthopedics;  Laterality: Right;  . ULNAR NERVE REPAIR     LEFT  . vein closure procedure Left 2010   Family History  Problem Relation Age of Onset  . Heart disease Mother        MI  . Arthritis Mother   . Stroke Mother   . Hyperlipidemia Mother   . Heart disease Father        MI  . Hyperlipidemia Father   . Diabetes Sister   . Hyperlipidemia Brother    Social History   Socioeconomic History  . Marital status:  Widowed    Spouse name: Not on file  . Number of children: Not on file  . Years of education: Not on file  . Highest education level: High school graduate  Occupational History  . Not on file  Social Needs  . Financial resource strain: Not hard at all  . Food insecurity:    Worry: Never true    Inability: Never true  . Transportation needs:    Medical: No    Non-medical: No  Tobacco Use  . Smoking status: Former Smoker    Packs/day: 3.00    Years: 25.00    Pack years: 75.00    Last attempt to quit: 11/03/1989    Years since quitting: 28.0  . Smokeless tobacco: Never Used  Substance and Sexual Activity  . Alcohol use: Not Currently    Alcohol/week: 0.0 oz  . Drug use: No  . Sexual activity: Not on file  Lifestyle  . Physical activity:    Days per week: 0 days    Minutes per session: 0 min  . Stress: Not at all  Relationships  . Social connections:    Talks on phone: Twice a week    Gets together: Twice a week    Attends religious service: Never    Active member of club or organization: No    Attends meetings of clubs or organizations: Never    Relationship status: Widowed  Other Topics Concern  . Not on file  Social History Narrative  . Not on file    Outpatient Encounter Medications as of 11/22/2017  Medication Sig  . aspirin EC 81 MG tablet Take 1 tablet (81 mg total) by mouth daily.  Marland Kitchen atorvastatin (LIPITOR) 20 MG tablet Take 1 tablet (20 mg total) by mouth daily.  . benazepril (LOTENSIN) 20 MG tablet Take 1 tablet (20 mg total) by mouth daily.  Marland Kitchen levothyroxine (SYNTHROID, LEVOTHROID) 100 MCG tablet Take 1 tablet (100 mcg total) by mouth daily.  . Multiple Vitamins-Minerals (CENTRUM SILVER ULTRA WOMENS PO) Take 1 tablet by mouth daily.    No facility-administered encounter medications on file as of 11/22/2017.     Activities of Daily Living In your present state of health, do you have any difficulty performing the following activities: 11/22/2017  Hearing? Y    Comment hearing aids  Vision? Y  Difficulty concentrating or making decisions? N  Walking or climbing stairs? Y  Comment SOB   Dressing or bathing? N  Doing errands, shopping? N  Preparing Food and eating ? N  Using the Toilet? N  In the past  six months, have you accidently leaked urine? N  Do you have problems with loss of bowel control? N  Managing your Medications? N  Managing your Finances? N  Housekeeping or managing your Housekeeping? N  Some recent data might be hidden    Patient Care Team: Guadalupe Maple, MD as PCP - General (Family Medicine) Christene Lye, MD (General Surgery) Guadalupe Maple, MD (Family Medicine) Birder Robson, MD as Referring Physician (Ophthalmology) Vladimir Crofts, MD (Neurology) Anthonette Legato, MD (Internal Medicine) Wellington Hampshire, MD as Consulting Physician (Cardiology)    Assessment:   This is a routine wellness examination for [facility].  Exercise Activities and Dietary recommendations Current Exercise Habits: The patient does not participate in regular exercise at present, Exercise limited by: None identified  Goals    . DIET - INCREASE WATER INTAKE     Recommend drinking at least 6-8 glasses of water a day        Fall Risk Fall Risk  11/22/2017 05/29/2017 11/02/2016 10/27/2016 10/06/2016  Falls in the past year? No No No No Yes  Number falls in past yr: - - 1 - 1  Injury with Fall? - - No - No   Is the patient's home free of loose throw rugs in walkways, pet beds, electrical cords, etc?   yes      Grab bars in the bathroom? yes      Handrails on the stairs?   yes      Adequate lighting?   yes  Timed Get Up and Go performed: Completed in 8 seconds with no use of assistive devices, steady gait. No intervention needed at this time.   Depression Screen PHQ 2/9 Scores 11/22/2017 05/29/2017 11/02/2016 10/27/2016  PHQ - 2 Score 0 0 0 0     Cognitive Function     6CIT Screen 11/22/2017 10/27/2016  What Year? 0 points 0 points   What month? 0 points 0 points  What time? 0 points 0 points  Count back from 20 0 points 0 points  Months in reverse 0 points 0 points  Repeat phrase 0 points 0 points  Total Score 0 0    Immunization History  Administered Date(s) Administered  . Influenza, High Dose Seasonal PF 06/06/2017  . Influenza,inj,Quad PF,6+ Mos 01/13/2015, 04/05/2016  . Pneumococcal Conjugate-13 09/29/2015  . Pneumococcal-Unspecified 08/21/2007  . Td 08/21/2007  . Tdap 11/22/2017    Qualifies for Shingles Vaccine? Yes, discussed shingrix vaccine   Screening Tests Health Maintenance  Topic Date Due  . INFLUENZA VACCINE  11/23/2017  . TETANUS/TDAP  11/23/2027  . DEXA SCAN  Completed  . PNA vac Low Risk Adult  Completed    Cancer Screenings: Lung: Low Dose CT Chest recommended if Age 24-80 years, 30 pack-year currently smoking OR have quit w/in 15years. Patient does not qualify. Breast:  Up to date on Mammogram? Yes  09/12/2017 Up to date of Bone Density/Dexa? Yes 02/23/2009 Colorectal: no longer required  Additional Screenings:  Hepatitis C Screening: not indicated      Plan:    I have personally reviewed and addressed the Medicare Annual Wellness questionnaire and have noted the following in the patient's chart:  A. Medical and social history B. Use of alcohol, tobacco or illicit drugs  C. Current medications and supplements D. Functional ability and status E.  Nutritional status F.  Physical activity G. Advance directives H. List of other physicians I.  Hospitalizations, surgeries, and ER visits in previous 12 months J.  Vitals K. Screenings such as hearing and vision if needed, cognitive and depression L. Referrals and appointments   In addition, I have reviewed and discussed with patient certain preventive protocols, quality metrics, and best practice recommendations. A written personalized care plan for preventive services as well as general preventive health recommendations were  provided to patient.   Signed,  Tyler Aas, LPN Nurse Health Advisor   Nurse Notes:none

## 2017-11-28 ENCOUNTER — Ambulatory Visit: Payer: Medicare Other | Admitting: Family Medicine

## 2017-11-30 DIAGNOSIS — N183 Chronic kidney disease, stage 3 (moderate): Secondary | ICD-10-CM | POA: Diagnosis not present

## 2017-11-30 DIAGNOSIS — D631 Anemia in chronic kidney disease: Secondary | ICD-10-CM | POA: Diagnosis not present

## 2017-11-30 DIAGNOSIS — I10 Essential (primary) hypertension: Secondary | ICD-10-CM | POA: Diagnosis not present

## 2017-12-27 ENCOUNTER — Ambulatory Visit: Payer: Medicare Other | Admitting: Family Medicine

## 2017-12-28 ENCOUNTER — Ambulatory Visit (INDEPENDENT_AMBULATORY_CARE_PROVIDER_SITE_OTHER): Payer: Medicare Other | Admitting: Family Medicine

## 2017-12-28 ENCOUNTER — Encounter: Payer: Self-pay | Admitting: Family Medicine

## 2017-12-28 VITALS — BP 105/64 | HR 74 | Wt 174.0 lb

## 2017-12-28 DIAGNOSIS — D472 Monoclonal gammopathy: Secondary | ICD-10-CM

## 2017-12-28 DIAGNOSIS — C9000 Multiple myeloma not having achieved remission: Secondary | ICD-10-CM

## 2017-12-28 DIAGNOSIS — N183 Chronic kidney disease, stage 3 unspecified: Secondary | ICD-10-CM

## 2017-12-28 DIAGNOSIS — E039 Hypothyroidism, unspecified: Secondary | ICD-10-CM

## 2017-12-28 DIAGNOSIS — I714 Abdominal aortic aneurysm, without rupture, unspecified: Secondary | ICD-10-CM

## 2017-12-28 DIAGNOSIS — E782 Mixed hyperlipidemia: Secondary | ICD-10-CM

## 2017-12-28 DIAGNOSIS — I779 Disorder of arteries and arterioles, unspecified: Secondary | ICD-10-CM | POA: Diagnosis not present

## 2017-12-28 DIAGNOSIS — I10 Essential (primary) hypertension: Secondary | ICD-10-CM | POA: Diagnosis not present

## 2017-12-28 DIAGNOSIS — I130 Hypertensive heart and chronic kidney disease with heart failure and stage 1 through stage 4 chronic kidney disease, or unspecified chronic kidney disease: Secondary | ICD-10-CM

## 2017-12-28 LAB — LP+ALT+AST PICCOLO, WAIVED
ALT (SGPT) Piccolo, Waived: 16 U/L (ref 10–47)
AST (SGOT) Piccolo, Waived: 27 U/L (ref 11–38)
Chol/HDL Ratio Piccolo,Waive: 3.5 mg/dL
Cholesterol Piccolo, Waived: 157 mg/dL (ref <200)
HDL Chol Piccolo, Waived: 45 mg/dL — ABNORMAL LOW (ref >59)
LDL Chol Calc Piccolo Waived: 66 mg/dL (ref <100)
Triglycerides Piccolo,Waived: 230 mg/dL — ABNORMAL HIGH (ref <150)
VLDL Chol Calc Piccolo,Waive: 46 mg/dL — ABNORMAL HIGH (ref <30)

## 2017-12-28 MED ORDER — TRIAMCINOLONE ACETONIDE 0.1 % EX CREA: 1.0000 "application " | TOPICAL_CREAM | Freq: Two times a day (BID) | CUTANEOUS | 0 refills | Status: AC

## 2017-12-28 NOTE — Assessment & Plan Note (Signed)
The current medical regimen is effective;  continue present plan and medications.  

## 2017-12-28 NOTE — Progress Notes (Signed)
BP 105/64   Pulse 74   Wt 174 lb (78.9 kg)   SpO2 96%   BMI 28.08 kg/m    Subjective:    Patient ID: Bailey Ayala, female    DOB: [birth year], 77 y.o.   MRN: [id number]  HPI: Bailey Ayala is a 77 y.o. female  Chief Complaint  Patient presents with  . Follow-up  . Hypertension  . Hyperlipidemia  Patient with a scaly itchy spot on posterior leg has tried some Elocon cream with some help but is run out once something else will give triamcinolone Patient blood pressure doing well with benazepril 20 mg and good control. Taking atorvastatin 20 mg without any problems. Also taking thyroid without any problems.  Relevant past medical, surgical, family and social history reviewed and updated as indicated. Interim medical history since our last visit reviewed. Allergies and medications reviewed and updated.  Review of Systems  Constitutional: Negative.   Respiratory: Negative.   Cardiovascular: Negative.      Per HPI unless specifically indicated above     Objective:    BP 105/64   Pulse 74   Wt 174 lb (78.9 kg)   SpO2 96%   BMI 28.08 kg/m   Wt Readings from Last 3 Encounters:  12/28/17 174 lb (78.9 kg)  11/22/17 174 lb 3.2 oz (79 kg)  11/14/17 176 lb 1 oz (79.9 kg)    Physical Exam  Constitutional: She is oriented to person, place, and time. She appears well-developed and well-nourished.  HENT:  Head: Normocephalic and atraumatic.  Eyes: Conjunctivae and EOM are normal.  Neck: Normal range of motion.  Cardiovascular: Normal rate, regular rhythm and normal heart sounds.  Pulmonary/Chest: Effort normal and breath sounds normal.  Musculoskeletal: Normal range of motion.  Neurological: She is alert and oriented to person, place, and time.  Skin: No erythema.  Psychiatric: She has a normal mood and affect. Her behavior is normal. Judgment and thought content normal.    Results for orders placed or performed in visit on 11/14/17  IGG  Result Value Ref  Range   IgG (Immunoglobin G), Serum 1,684 (H) 700 - 1,600 mg/dL  Protein electrophoresis, serum  Result Value Ref Range   Total Protein ELP 7.4 6.0 - 8.5 g/dL   Albumin ELP 3.7 2.9 - 4.4 g/dL   Alpha-1-Globulin 0.3 0.0 - 0.4 g/dL   Alpha-2-Globulin 1.1 (H) 0.4 - 1.0 g/dL   Beta Globulin 1.0 0.7 - 1.3 g/dL   Gamma Globulin 1.4 0.4 - 1.8 g/dL   M-Spike, % 1.2 (H) Not Observed g/dL   SPE Interp. Comment    Comment Comment    GLOBULIN, TOTAL 3.7 2.2 - 3.9 g/dL   A/G Ratio 1.0 0.7 - 1.7  Comprehensive metabolic panel  Result Value Ref Range   Sodium 136 135 - 145 mmol/L   Potassium 4.4 3.5 - 5.1 mmol/L   Chloride 106 98 - 111 mmol/L   CO2 22 22 - 32 mmol/L   Glucose, Bld 96 70 - 99 mg/dL   BUN 23 8 - 23 mg/dL   Creatinine, Ser 1.55 (H) 0.44 - 1.00 mg/dL   Calcium 9.0 8.9 - 10.3 mg/dL   Total Protein 7.9 6.5 - 8.1 g/dL   Albumin 4.0 3.5 - 5.0 g/dL   AST 22 15 - 41 U/L   ALT 15 0 - 44 U/L   Alkaline Phosphatase 76 38 - 126 U/L   Total Bilirubin 0.6 0.3 - 1.2 mg/dL  GFR calc non Af Amer 31 (L) >60 mL/min   GFR calc Af Amer 36 (L) >60 mL/min   Anion gap 8 5 - 15  CBC with Differential  Result Value Ref Range   WBC 5.4 3.6 - 11.0 K/uL   RBC 2.96 (L) 3.80 - 5.20 MIL/uL   Hemoglobin 10.0 (L) 12.0 - 16.0 g/dL   HCT 29.7 (L) 35.0 - 47.0 %   MCV 100.2 (H) 80.0 - 100.0 fL   MCH 33.9 26.0 - 34.0 pg   MCHC 33.8 32.0 - 36.0 g/dL   RDW 14.5 11.5 - 14.5 %   Platelets 237 150 - 440 K/uL   Neutrophils Relative % 64 %   Neutro Abs 3.5 1.4 - 6.5 K/uL   Lymphocytes Relative 23 %   Lymphs Abs 1.2 1.0 - 3.6 K/uL   Monocytes Relative 7 %   Monocytes Absolute 0.4 0.2 - 0.9 K/uL   Eosinophils Relative 5 %   Eosinophils Absolute 0.3 0 - 0.7 K/uL   Basophils Relative 1 %   Basophils Absolute 0.0 0 - 0.1 K/uL      Assessment & Plan:   Problem List Items Addressed This Visit      Cardiovascular and Mediastinum   Hypertension    The current medical regimen is effective;  continue present  plan and medications.       Relevant Orders   Basic metabolic panel   LP+ALT+AST Piccolo, Waived   Benign hypertensive heart and CKD, stage 3 (GFR 30-59), w CHF ([place])    The current medical regimen is effective;  continue present plan and medications.       Abdominal aortic aneurysm (AAA) without rupture (HCC)    stable        Endocrine   Hypothyroid    The current medical regimen is effective;  continue present plan and medications.         Other   Hyperlipidemia - Primary   Relevant Orders   Basic metabolic panel   LP+ALT+AST Piccolo, Waived   Smoldering multiple myeloma ([place])    The current medical regimen is effective;  continue present plan and medications.        Also given triamcinolone for dermatitis posterior leg.  Follow up plan: Return in about 6 months (around 06/28/2018) for Physical Exam.

## 2017-12-28 NOTE — Assessment & Plan Note (Signed)
stable °

## 2017-12-29 ENCOUNTER — Encounter: Payer: Self-pay | Admitting: Family Medicine

## 2017-12-29 LAB — BASIC METABOLIC PANEL
BUN/Creatinine Ratio: 29 — ABNORMAL HIGH (ref 12–28)
BUN: 40 mg/dL — ABNORMAL HIGH (ref 8–27)
CO2: 19 mmol/L — ABNORMAL LOW (ref 20–29)
Calcium: 9.1 mg/dL (ref 8.7–10.3)
Chloride: 104 mmol/L (ref 96–106)
Creatinine, Ser: 1.4 mg/dL — ABNORMAL HIGH (ref 0.57–1.00)
GFR calc Af Amer: 42 mL/min/{1.73_m2} — ABNORMAL LOW (ref >59)
GFR calc non Af Amer: 37 mL/min/{1.73_m2} — ABNORMAL LOW (ref >59)
Glucose: 96 mg/dL (ref 65–99)
Potassium: 5 mmol/L (ref 3.5–5.2)
Sodium: 139 mmol/L (ref 134–144)

## 2018-01-24 ENCOUNTER — Ambulatory Visit
Admission: RE | Admit: 2018-01-24 | Discharge: 2018-01-24 | Disposition: A | Discharge status: Home / Self Care | Payer: Medicare Other | Source: Ambulatory Visit | Attending: Urgent Care | Admitting: Urgent Care

## 2018-01-24 DIAGNOSIS — J439 Emphysema, unspecified: Secondary | ICD-10-CM | POA: Insufficient documentation

## 2018-01-24 DIAGNOSIS — C9000 Multiple myeloma not having achieved remission: Secondary | ICD-10-CM | POA: Insufficient documentation

## 2018-01-24 DIAGNOSIS — R911 Solitary pulmonary nodule: Secondary | ICD-10-CM | POA: Insufficient documentation

## 2018-01-24 DIAGNOSIS — I700 Atherosclerosis of aorta: Secondary | ICD-10-CM | POA: Diagnosis not present

## 2018-01-24 HISTORY — DX: Disorder of kidney and ureter, unspecified: N28.9

## 2018-01-24 MED ORDER — IOHEXOL 300 MG/ML  SOLN
60.0000 mL | Freq: Once | INTRAMUSCULAR | Status: AC | PRN
  Administered 2018-01-24: 60 mL via INTRAVENOUS

## 2018-01-26 ENCOUNTER — Inpatient Hospital Stay (HOSPITAL_BASED_OUTPATIENT_CLINIC_OR_DEPARTMENT_OTHER): Payer: Medicare Other | Admitting: Hematology and Oncology

## 2018-01-26 ENCOUNTER — Ambulatory Visit
Admission: RE | Admit: 2018-01-26 | Discharge: 2018-01-26 | Disposition: A | Discharge status: Home / Self Care | Payer: Medicare Other | Source: Ambulatory Visit | Attending: Hematology and Oncology | Admitting: Hematology and Oncology

## 2018-01-26 ENCOUNTER — Encounter: Payer: Self-pay | Admitting: Hematology and Oncology

## 2018-01-26 ENCOUNTER — Ambulatory Visit
Admission: RE | Admit: 2018-01-26 | Discharge: 2018-01-26 | Disposition: A | Discharge status: Home / Self Care | Payer: Medicare Other | Source: Ambulatory Visit | Attending: Urgent Care | Admitting: Urgent Care

## 2018-01-26 ENCOUNTER — Other Ambulatory Visit: Payer: Self-pay

## 2018-01-26 ENCOUNTER — Inpatient Hospital Stay: Payer: Medicare Other | Attending: Hematology and Oncology

## 2018-01-26 VITALS — BP 148/74 | HR 81 | Temp 97.6°F | Resp 18 | Wt 175.4 lb

## 2018-01-26 DIAGNOSIS — E039 Hypothyroidism, unspecified: Secondary | ICD-10-CM | POA: Diagnosis not present

## 2018-01-26 DIAGNOSIS — M1711 Unilateral primary osteoarthritis, right knee: Secondary | ICD-10-CM | POA: Diagnosis not present

## 2018-01-26 DIAGNOSIS — M1990 Unspecified osteoarthritis, unspecified site: Secondary | ICD-10-CM | POA: Diagnosis not present

## 2018-01-26 DIAGNOSIS — Z79899 Other long term (current) drug therapy: Secondary | ICD-10-CM

## 2018-01-26 DIAGNOSIS — E1122 Type 2 diabetes mellitus with diabetic chronic kidney disease: Secondary | ICD-10-CM

## 2018-01-26 DIAGNOSIS — C9000 Multiple myeloma not having achieved remission: Secondary | ICD-10-CM | POA: Insufficient documentation

## 2018-01-26 DIAGNOSIS — D472 Monoclonal gammopathy: Secondary | ICD-10-CM

## 2018-01-26 DIAGNOSIS — N183 Chronic kidney disease, stage 3 (moderate): Secondary | ICD-10-CM | POA: Diagnosis not present

## 2018-01-26 DIAGNOSIS — R1030 Lower abdominal pain, unspecified: Secondary | ICD-10-CM | POA: Insufficient documentation

## 2018-01-26 DIAGNOSIS — J449 Chronic obstructive pulmonary disease, unspecified: Secondary | ICD-10-CM | POA: Diagnosis not present

## 2018-01-26 DIAGNOSIS — M898X9 Other specified disorders of bone, unspecified site: Secondary | ICD-10-CM

## 2018-01-26 DIAGNOSIS — Z8673 Personal history of transient ischemic attack (TIA), and cerebral infarction without residual deficits: Secondary | ICD-10-CM | POA: Insufficient documentation

## 2018-01-26 DIAGNOSIS — R1031 Right lower quadrant pain: Secondary | ICD-10-CM

## 2018-01-26 DIAGNOSIS — G8929 Other chronic pain: Secondary | ICD-10-CM

## 2018-01-26 DIAGNOSIS — E119 Type 2 diabetes mellitus without complications: Secondary | ICD-10-CM

## 2018-01-26 DIAGNOSIS — M1611 Unilateral primary osteoarthritis, right hip: Secondary | ICD-10-CM | POA: Insufficient documentation

## 2018-01-26 DIAGNOSIS — Z87891 Personal history of nicotine dependence: Secondary | ICD-10-CM

## 2018-01-26 DIAGNOSIS — I129 Hypertensive chronic kidney disease with stage 1 through stage 4 chronic kidney disease, or unspecified chronic kidney disease: Secondary | ICD-10-CM | POA: Insufficient documentation

## 2018-01-26 DIAGNOSIS — M8580 Other specified disorders of bone density and structure, unspecified site: Secondary | ICD-10-CM | POA: Insufficient documentation

## 2018-01-26 DIAGNOSIS — R072 Precordial pain: Secondary | ICD-10-CM | POA: Diagnosis not present

## 2018-01-26 DIAGNOSIS — E785 Hyperlipidemia, unspecified: Secondary | ICD-10-CM | POA: Insufficient documentation

## 2018-01-26 DIAGNOSIS — Z7982 Long term (current) use of aspirin: Secondary | ICD-10-CM

## 2018-01-26 LAB — CBC WITH DIFFERENTIAL/PLATELET
Basophils Absolute: 0 10*3/uL (ref 0–0.1)
Basophils Relative: 0 %
Eosinophils Absolute: 0.3 10*3/uL (ref 0–0.7)
Eosinophils Relative: 6 %
HCT: 29 % — ABNORMAL LOW (ref 35.0–47.0)
Hemoglobin: 9.8 g/dL — ABNORMAL LOW (ref 12.0–16.0)
Lymphocytes Relative: 27 %
Lymphs Abs: 1.2 10*3/uL (ref 1.0–3.6)
MCH: 34 pg (ref 26.0–34.0)
MCHC: 33.6 g/dL (ref 32.0–36.0)
MCV: 101.1 fL — ABNORMAL HIGH (ref 80.0–100.0)
Monocytes Absolute: 0.3 10*3/uL (ref 0.2–0.9)
Monocytes Relative: 8 %
Neutro Abs: 2.8 10*3/uL (ref 1.4–6.5)
Neutrophils Relative %: 59 %
Platelets: 229 10*3/uL (ref 150–440)
RBC: 2.87 MIL/uL — ABNORMAL LOW (ref 3.80–5.20)
RDW: 14 % (ref 11.5–14.5)
WBC: 4.6 10*3/uL (ref 3.6–11.0)

## 2018-01-26 LAB — COMPREHENSIVE METABOLIC PANEL
ALT: 14 U/L (ref 0–44)
AST: 22 U/L (ref 15–41)
Albumin: 4.1 g/dL (ref 3.5–5.0)
Alkaline Phosphatase: 75 U/L (ref 38–126)
Anion gap: 8 (ref 5–15)
BUN: 25 mg/dL — ABNORMAL HIGH (ref 8–23)
CO2: 23 mmol/L (ref 22–32)
Calcium: 9 mg/dL (ref 8.9–10.3)
Chloride: 104 mmol/L (ref 98–111)
Creatinine, Ser: 1.5 mg/dL — ABNORMAL HIGH (ref 0.44–1.00)
GFR calc Af Amer: 38 mL/min — ABNORMAL LOW (ref >60)
GFR calc non Af Amer: 33 mL/min — ABNORMAL LOW (ref >60)
Glucose, Bld: 107 mg/dL — ABNORMAL HIGH (ref 70–99)
Potassium: 4.3 mmol/L (ref 3.5–5.1)
Sodium: 135 mmol/L (ref 135–145)
Total Bilirubin: 0.5 mg/dL (ref 0.3–1.2)
Total Protein: 7.9 g/dL (ref 6.5–8.1)

## 2018-01-26 NOTE — Progress Notes (Signed)
Here for follow up . Per pt overall feeling " good " ( except for my leg.) then  C/o feeling tired all the time " she added

## 2018-01-26 NOTE — Progress Notes (Signed)
[facility] day:  01/26/2018  Chief Complaint: ASRA GAMBREL is a 77 y.o. female with smoldering myeloma who is seen for 3 month assessment.   HPI:  The patient was last seen in the medical oncology clinic on 11/14/2017.  At that time, patient was doing well.  Energy continue to improve with the change in her thyroid medications.  No B symptoms or recent infections.  Patient was bruising easily. Only eating 1 meal per day per family; weight stable.   Interval CT imaging of the chest done on 100/05/2017 revealed persistent LLL nodule.  Area calcified and felt to be consistent with a granuloma.  There were no other suspicious pulmonary nodules.  There was new Schmorl's node formation involving the inferior endplate of B33.  There were stable small scattered osseous lesions related to underlying multiple myeloma.  In the interim, patient complains of a "burning" sensation in her sternum. She has worsening exertional shortness of breath. She is scheduled to see her cardiologist on 02/06/2018. She has pain in her back and in her LEFT lower rib area. Patient has pain in her RIGHT groin that extends down into the anterior thigh. Pain persistent x 2 months. She denies trauma. Patient tearful in clinic due to her pain.   Energy continues to improve. Patient denies that she has experienced any B symptoms. She denies any interval infections.   Patient advises that she maintains an adequate appetite. She is eating well. Weight today is 175 lb 6.4 oz (79.6 kg), which compared to her last visit to the clinic, represents a 1 pound decrease. Continues to only eat one meal per day citing that it is "all she wants".   Patient complains of pain related 7/10 in the clinic today.   Past Medical History:  Diagnosis Date  . Arthritis   . Blood dyscrasia    BLEEDS EASILY   . COPD (chronic obstructive pulmonary disease) ([place])   . Diabetes mellitus without complication  ([place])   . Diffuse cystic mastopathy   . Hyperlipidemia   . Hypertension   . Hypothyroidism   . L4-L5 disc bulge   . Multiple myeloma ([place])   . Osteoporosis, post-menopausal   . PAD (peripheral artery disease) ([place])   . Renal insufficiency   . Shortness of breath dyspnea    WITH EXERTION   . Stroke ([place])    TIA     2016     WAS FOUND ON SCAN ORDERED FROM NEUROL  . Thyroid disease   . TIA (transient ischemic attack) 2016   left hand weakness    Past Surgical History:  Procedure Laterality Date  . ABDOMINAL HYSTERECTOMY  1990  . BACK SURGERY  2011  . BREAST EXCISIONAL BIOPSY Left    2010?   Marland Kitchen CATARACT EXTRACTION, BILATERAL    . COLONOSCOPY  1998  . EYE SURGERY Bilateral 05/06/14  . JOINT REPLACEMENT     LT KNEE  . KNEE SURGERY Left 2012  . OOPHORECTOMY  1990  . parathyroid transplant     2/3 THYROID REMOVED   ([place])  . SPINE SURGERY    . THYROID SURGERY    . TONSILLECTOMY    . TOTAL SHOULDER ARTHROPLASTY Left 01/14/2016   Procedure: LEFT TOTAL SHOULDER ARTHROPLASTY;  Surgeon: Justice Britain, MD;  Location: [facility];  Service: Orthopedics;  Laterality: Left;  . TOTAL SHOULDER ARTHROPLASTY Right 11/03/2016   Procedure: RIGHT TOTAL SHOULDER ARTHROPLASTY;  Surgeon: Justice Britain, MD;  Location: [facility];  Service: Orthopedics;  Laterality: Right;  . ULNAR NERVE REPAIR     LEFT  . vein closure procedure Left 2010    Family History  Problem Relation Age of Onset  . Heart disease Mother        MI  . Arthritis Mother   . Stroke Mother   . Hyperlipidemia Mother   . Heart disease Father        MI  . Hyperlipidemia Father   . Diabetes Sister   . Hyperlipidemia Brother     Social History:  reports that she quit smoking about 28 years ago. She has a 75.00 pack-year smoking history. She has never used smokeless tobacco. She reports that she drank alcohol. She reports that she does not use drugs.  Patient is a former 3 pack per day smoker for 25 years (75 pack year). Patient lives in  [place]. She is a former Electrical engineer. Patient denies known exposures to radiation on toxins. The patient is accompanied by her daughter today.   Another daughter is on the cell phone via speaker phone.  Allergies:  Allergies  Allergen Reactions  . Codeine Other (See Comments)    hallucinations    . Simvastatin Diarrhea and Nausea Only    Current Medications: Current Outpatient Medications  Medication Sig Dispense Refill  . aspirin EC 81 MG tablet Take 1 tablet (81 mg total) by mouth daily. 90 tablet 3  . atorvastatin (LIPITOR) 20 MG tablet Take 1 tablet (20 mg total) by mouth daily. 90 tablet 4  . benazepril (LOTENSIN) 20 MG tablet Take 1 tablet (20 mg total) by mouth daily. 90 tablet 4  . levothyroxine (SYNTHROID, LEVOTHROID) 100 MCG tablet Take 1 tablet (100 mcg total) by mouth daily. 90 tablet 3  . Multiple Vitamins-Minerals (CENTRUM SILVER ULTRA WOMENS PO) Take 1 tablet by mouth daily.     Marland Kitchen triamcinolone cream (KENALOG) 0.1 % Apply 1 application topically 2 (two) times daily. (Patient not taking: Reported on 01/26/2018) 30 g 0   No current facility-administered medications for this visit.     Review of Systems  Constitutional: Positive for weight loss (down 1 pound). Negative for diaphoresis, fever and malaise/fatigue.  HENT: Negative.   Eyes: Negative.   Respiratory: Positive for cough (chronic) and shortness of breath (exertional). Negative for hemoptysis and sputum production.        COPD  Cardiovascular: Negative for chest pain, palpitations, orthopnea, leg swelling and PND.  Gastrointestinal: Positive for diarrhea (loose stools 4-5 times daily for "years". Refuses colonoscopy.). Negative for abdominal pain, blood in stool, constipation, melena, nausea and vomiting.  Genitourinary: Negative for dysuria, frequency, hematuria and urgency.  Musculoskeletal: Positive for back pain and joint pain (LEFT lower ribs. PMH (+) for OA. ). Negative for falls and myalgias.       Burning  sensation in sternum. Radicular pain in RIGHT groin into thigh.  Skin: Negative for itching and rash.  Neurological: Negative for dizziness, tremors, weakness and headaches.  Endo/Heme/Allergies: Does not bruise/bleed easily.       PMH (+) for HYPOthyroidism - on levothyroxine  Psychiatric/Behavioral: Negative for depression, memory loss and suicidal ideas. The patient is nervous/anxious. The patient does not have insomnia.   All other systems reviewed and are negative.  Performance status (ECOG): 1 - Symptomatic but completely ambulatory  Vital Signs BP (!) 148/74 (BP Location: Left Arm, Patient Position: Sitting)   Pulse 81   Temp 97.6 F (36.4 C) (Tympanic)  Resp 18   Wt 175 lb 6.4 oz (79.6 kg)   BMI 28.31 kg/m   Physical Exam  Constitutional: She is oriented to person, place, and time. She appears distressed (tearful in clinic 2/2 pain and fear of unknown).  HENT:  Head: Normocephalic and atraumatic.  Mouth/Throat: Oropharynx is clear and moist and mucous membranes are normal.  Long brown hair  Eyes: Pupils are equal, round, and reactive to light. EOM are normal. No scleral icterus.  Blue eyes.   Neck: Normal range of motion. Neck supple. No tracheal deviation present. No thyromegaly present.  Cardiovascular: Normal rate, regular rhythm, normal heart sounds and intact distal pulses. Exam reveals no gallop and no friction rub.  No murmur heard. Pulmonary/Chest: Effort normal and breath sounds normal. No respiratory distress. She has no wheezes. She has no rales.  Abdominal: Soft. Bowel sounds are normal. She exhibits no distension. There is no tenderness.  Musculoskeletal: Normal range of motion. She exhibits tenderness. She exhibits no edema.  Pain on palpation anterior right iliac crest and lower femur.  Lymphadenopathy:    She has no cervical adenopathy.    She has no axillary adenopathy.       Right: No inguinal and no supraclavicular adenopathy present.       Left: No  inguinal and no supraclavicular adenopathy present.  Neurological: She is alert and oriented to person, place, and time.  Skin: Skin is warm and dry. No rash noted. No erythema.  Psychiatric: Mood, affect and judgment normal.  Nursing note and vitals reviewed.   Orders Only on 01/26/2018  Component Date Value Ref Range Status  . Sodium 01/26/2018 135  135 - 145 mmol/L Final  . Potassium 01/26/2018 4.3  3.5 - 5.1 mmol/L Final  . Chloride 01/26/2018 104  98 - 111 mmol/L Final  . CO2 01/26/2018 23  22 - 32 mmol/L Final  . Glucose, Bld 01/26/2018 107* 70 - 99 mg/dL Final  . BUN 01/26/2018 25* 8 - 23 mg/dL Final  . Creatinine, Ser 01/26/2018 1.50* 0.44 - 1.00 mg/dL Final  . Calcium 01/26/2018 9.0  8.9 - 10.3 mg/dL Final  . Total Protein 01/26/2018 7.9  6.5 - 8.1 g/dL Final  . Albumin 01/26/2018 4.1  3.5 - 5.0 g/dL Final  . AST 01/26/2018 22  15 - 41 U/L Final  . ALT 01/26/2018 14  0 - 44 U/L Final  . Alkaline Phosphatase 01/26/2018 75  38 - 126 U/L Final  . Total Bilirubin 01/26/2018 0.5  0.3 - 1.2 mg/dL Final  . GFR calc non Af Amer 01/26/2018 33* >60 mL/min Final  . GFR calc Af Amer 01/26/2018 38* >60 mL/min Final   Comment: (NOTE) The eGFR has been calculated using the CKD EPI equation. This calculation has not been validated in all clinical situations. eGFR's persistently <60 mL/min signify possible Chronic Kidney Disease.   Georgiann Hahn gap 01/26/2018 8  5 - 15 Final   Performed at [facility], [address] [place],  [address]  . WBC 01/26/2018 4.6  3.6 - 11.0 K/uL Final  . RBC 01/26/2018 2.87* 3.80 - 5.20 MIL/uL Final  . Hemoglobin 01/26/2018 9.8* 12.0 - 16.0 g/dL Final  . HCT 01/26/2018 29.0* 35.0 - 47.0 % Final  . MCV 01/26/2018 101.1* 80.0 - 100.0 fL Final  . MCH 01/26/2018 34.0  26.0 - 34.0 pg Final  . MCHC 01/26/2018 33.6  32.0 - 36.0 g/dL Final  . RDW 01/26/2018 14.0  11.5 - 14.5 %  Final  . Platelets 01/26/2018 229  150 - 440 K/uL Final  . Neutrophils  Relative % 01/26/2018 59  % Final  . Neutro Abs 01/26/2018 2.8  1.4 - 6.5 K/uL Final  . Lymphocytes Relative 01/26/2018 27  % Final  . Lymphs Abs 01/26/2018 1.2  1.0 - 3.6 K/uL Final  . Monocytes Relative 01/26/2018 8  % Final  . Monocytes Absolute 01/26/2018 0.3  0.2 - 0.9 K/uL Final  . Eosinophils Relative 01/26/2018 6  % Final  . Eosinophils Absolute 01/26/2018 0.3  0 - 0.7 K/uL Final  . Basophils Relative 01/26/2018 0  % Final  . Basophils Absolute 01/26/2018 0.0  0 - 0.1 K/uL Final   Performed at [facility], [address], [place], [place] [address]    Assessment:  SHEROL SABAS is a 77 y.o. female with smoldering multiple myeloma.  SPEP on 06/21/2017 revealed a 1.2 gm/dL monoclonal spike.  Random urine revealed 4.6 mg/dL protein and a 40.6% M-spike.  Calcium, albumin, and serum protein were normal.  Work-up on 06/30/2017 revealed a hematocrit of 31.1, hemoglobin 10.4, MCV 97.4, platelets 227,000, WBC 5700 with an ANC of 3300.  SPEP revealed a 1.1 gm/dL IgG monoclonal protein with lambda light chain specificity and monoclonal free lambda light chains (Bence-Jones protein).  IgG was 1660 ([phone]) and IgA was 48 (64-422).  Kappa free light chains were 23.7, lambda free light chains were 142.2 and ratio 0.17 (0.26-1.65).  Beta2-microglobulin was 4.7 (0.6-2.4).  Normal studies included:  B12, folate, and ferritin (75).  TSH was 0.204 (low).  24 hour UPEP revealed kappa free light chains 28.80 mg/L, lambda free light chains 63.80 mg/L and ration 0.45 (2.04 - 10.37).  M spike was 27.4% (16 mg/24 hours).  Bone survey on 06/30/2017 revealed no suspicious focal bony lesions.  Bone marrow aspirate and biopsy on 07/26/2017 revealed a slightly hypercellular marrow for age with trilineage hematopoiesis.  There was 13% plasmacytosis.  IHC stains for CD138 and in situ hybridization for kappa and lambda revealed interstitial cells and small clusters.  Lambda light chains appeared restricted.   Features were felt compatible with plasma cell neoplasm.  PET scan on 08/14/2017 revealed no hypermetabolic lesions of myeloma.  There was a 3.6 cm infrarenal aortic aneurysm.  Ultrasound in 2 years was recommended.  She had a 4 mm LLL nodule too small to characterize.  SPEP has been followed: 1.1 on 06/30/2017, 1.2 on 11/14/2017, 1.3 on 01/26/2018.  IgG has been followed:  1660 on 06/30/2017, 1684 on 11/14/2017, and 1795 on 01/26/2018.  Chest CT on 01/24/2018 revealed persistent LLL nodule.  Area calcified and felt to be consistent with a granuloma.  There were no other suspicious pulmonary nodules.  There was new Schmorl's node formation involving the inferior endplate of J03.  There were stable small scattered osseous lesions related to underlying multiple myeloma.  She has stage III chronic kidney disease (Cr 1.55; CrCl 32.3 ml/min).     She has a normocytic anemia.  Iron studies were normal on 06/21/2017.  Appetite is poor.  Last colonoscopy was > 10 years ago (she declines).  She has a 75 pack year smoking history.  She has COPD.  Chest CT without contrast on 07/25/2017 revealed no suspicious pulmonary nodules.  Symptomatically, patient is having retrosternal pain, pain in her LEFT ribs, and radicular pain in her RIGHT groin into her anterior thigh. Chronic cough and DOE persists due to PMH (+) for COPD. Energy continues to improve with  thyroid management. Appetite is stable; down one pound. Continues to eat only 1 meal per day. Exam is grossly stable.  Hemoglobin is 9.8, hematocrit 29.0, MCV 101.1, and platelets 229,000.  BUN 25 and creatinine 1.50 (CrCl 33.9 mL/min).  SPEP revealed a M spike of 1.3.  IgG level elevated at 1795 mg/dL.  Plan: 1. Labs today: CBC with differential, CMP, SPEP, IgG, FLCA. 2. Smoldering myeloma  Review interval CT imaging of the chest. Imaging personally reviewed and felt to be consistent with the dictated radiology report. Imaging reviewed with patient.    Persistent LLL nodule felt to be consistent with a granuloma.  No other pulmonary nodules.  New Schmorl's node formation involving the anterior endplate of F12.  New mention of small scattered osseous lesions related to multiple myeloma.  Will have radiology compare most recent CT with previous PET imaging done on 08/14/2017 due to mention of osseous lesions. 3. Retrosternal burning  Chest discomfort described as a "burning" sensation.  Patient denies radiation of the pain into her neck, jaw, shoulder, or subscapular area.  Increased shortness of breath present, however felt to be related to patient's underlying COPD diagnosis.  Doubt symptomology cardiac in nature, however encourage patient to follow-up with her primary cardiologist for further evaluation.  Patient is scheduled to see cardiology on 02/06/2018. 4. Groin pain  Acute pain radiating from the groin to the anterior thigh.  Patient denies trauma.  Concern for possible osseous lytic lesions associated with patient's underlying multiple myeloma diagnosed.  Will obtain diagnostic plain films of the pelvis and right femur to further assess.  Patient notes plans to call cardiologist to request vascular US with ABI, as she feels as if her pain is vascular in nature. 5. Shortness of breath  Chronic in nature, however noted to be increasing.  Patient encouraged to discuss shortness of breath with her PCP, as she is not on any prescribed MDI or SVN medications. 6. Nutrition  Weight today is 175 lb 6.4 oz (79.6 kg). Body mass index is 28.31 kg/m.  Patient reportedly only eating 1 meal a day citing the fact that that is "all she wants".    Encouraged her to increase her intake of calorie and protein dense food choices.   If unable to eat, encourage patient to utilize nutritional supplement shakes at least 2-3 times a day.   We will continue to monitor. 7. RTC in 1 week for MD assessment and review of  work-up.  ADDENDUM: Diagnostic plain films of the pelvis demonstrated no acute osseous injury.  There was generalized osteopenia.  No aggressive lytic or sclerotic osseous lesions noted.  Diagnostic plain films of the right femur demonstrate no focal osseous lesion.  There was moderate osteoarthritis noted on the radiographs.  Results communicated to the patient.    Honor Loh, NP 01/26/18, 3:43 PM   I saw and evaluated the patient, participating in the key portions of the service and reviewing pertinent diagnostic studies and records.  I reviewed the nurse practitioner's note and agree with the findings and the plan.  The assessment and plan were discussed with the patient. Several questions were asked by the patient and answered.   Nolon Stalls, MD 01/26/18, 3:43 PM

## 2018-01-27 LAB — IGG: IgG (Immunoglobin G), Serum: 1795 mg/dL — ABNORMAL HIGH (ref 700–1600)

## 2018-01-29 ENCOUNTER — Telehealth: Payer: Self-pay | Admitting: Cardiovascular Disease

## 2018-01-29 DIAGNOSIS — I739 Peripheral vascular disease, unspecified: Secondary | ICD-10-CM

## 2018-01-29 LAB — PROTEIN ELECTROPHORESIS, SERUM
A/G Ratio: 1 (ref 0.7–1.7)
Albumin ELP: 3.8 g/dL (ref 2.9–4.4)
Alpha-1-Globulin: 0.3 g/dL (ref 0.0–0.4)
Alpha-2-Globulin: 1 g/dL (ref 0.4–1.0)
Beta Globulin: 1 g/dL (ref 0.7–1.3)
Gamma Globulin: 1.5 g/dL (ref 0.4–1.8)
Globulin, Total: 3.7 g/dL (ref 2.2–3.9)
M-Spike, %: 1.3 g/dL — ABNORMAL HIGH
Total Protein ELP: 7.5 g/dL (ref 6.0–8.5)

## 2018-01-29 NOTE — Telephone Encounter (Signed)
I called and spoke with the patient. She has a history of PVD with known bilateral SFA disease w/ left sided occlusion. She states that she has had a '"dull ache" in her legs for the last 2 months that seems to be getting worse.  Discomfort is worse with walking and she notices a "stabbing" sensation to her groin area. She states that at night, both of her legs bother her as well. She states the discomfort she has at night reminds her of what menstrual cramps felt like.   I have advised her that I do not have any sooner openings with Dr. Fletcher Anon at this time. However I will review her symptoms with him and see if we can go ahead and order a lower extremity arterial doppler w/ ABI's prior to him seeing her. Last arterial dopplers were 12/10/15.   She is aware I will call her back directly if he does not want to do an ultrasound at this time, but otherwise, she will get a call from our scheduling department.  The patient is agreeable and voices understanding.

## 2018-01-29 NOTE — Telephone Encounter (Signed)
Patient calling  Patient is scheduled to see Dr. Fletcher Anon on 10/15 Patient would like to be seen sooner, having issues with veins in legs Believes she will need a doppler scan, would like to speak with nurse  Please call to discuss

## 2018-01-30 NOTE — Telephone Encounter (Signed)
Yes please do.  Thanks

## 2018-01-31 NOTE — Telephone Encounter (Addendum)
Orders placed for ABI's w/ lower extremity arterial duplex to be done prior to the patient seeing Dr. Fletcher Anon on 02/06/18.  Staff message sent to [facility] scheduling pool to please call to arrange ASAP.

## 2018-02-02 ENCOUNTER — Ambulatory Visit (HOSPITAL_COMMUNITY)
Admission: RE | Admit: 2018-02-02 | Discharge: 2018-02-02 | Disposition: A | Discharge status: Home / Self Care | Payer: Medicare Other | Source: Ambulatory Visit | Attending: Cardiovascular Disease | Admitting: Cardiovascular Disease

## 2018-02-02 DIAGNOSIS — I739 Peripheral vascular disease, unspecified: Secondary | ICD-10-CM | POA: Diagnosis not present

## 2018-02-04 NOTE — Progress Notes (Signed)
[facility] day:  02/05/2018  Chief Complaint: Bailey Ayala is a 77 y.o. female with smoldering myeloma who is seen for 1 week assessment.   HPI:  The patient was last seen in the medical oncology clinic on 01/26/2018.  At that time, patient had several new complaints of pain. She complained of sternal "burning" sensation, and a radicular pain in her RIGHT groin into her anterior thigh. Diagnostic plain films of pelvis and RIGHT femur were negative for osseous lesions. She noted continued pain in her back and LEFT lower ribs. She was tearful in clinic. Energy continued to improve with thyroid medication adjustments. Labile appetite. Continued to eat 1 meal per day. Weight down 1 pound. Exam was grossly unremarkable.  Labs on 01/26/2018 revealed a hemoglobin of 9.8, hematocrit 29.0, MCV 101.1, and platelets 229,000.  BUN 25 and creatinine 1.50 (CrCl 33.9 mL/min).  SPEP revealed a M spike of 1.3.  IgG level was elevated at 1795 mg/dL.  Patient contacted Dr. Tyrell Antonio office on 01/29/2018 requesting an expedited appointment. Additionally, she requested a repeat vascular assessment of her lower extremities, as she felt her pain to be vascular in nature. Cardiology office unable to expedite appointment, however request for vascular ultrasounds with ABI assessment obliged.   Lower extremity vascular ultrasound with ABI on 02/02/2018 revealed mild RIGHT lower extremity arterial disease (TBI normal), and moderate LEFT lower extremity arterial disease (TBI abnormal). She is scheduled to see cardiology Fletcher Anon, MD) in follow up on 02/06/2018 to discuss results.   In the interim, she notes that her right hip is aching.  She is seeing a neurologist in [place].   Past Medical History:  Diagnosis Date  . Arthritis   . Blood dyscrasia    BLEEDS EASILY   . COPD (chronic obstructive pulmonary disease) ([place])   . Diabetes mellitus without complication ([place])   .  Diffuse cystic mastopathy   . Hyperlipidemia   . Hypertension   . Hypothyroidism   . L4-L5 disc bulge   . Multiple myeloma ([place])   . Osteoporosis, post-menopausal   . PAD (peripheral artery disease) ([place])   . Renal insufficiency   . Shortness of breath dyspnea    WITH EXERTION   . Stroke ([place])    TIA     2016     WAS FOUND ON SCAN ORDERED FROM NEUROL  . Thyroid disease   . TIA (transient ischemic attack) 2016   left hand weakness    Past Surgical History:  Procedure Laterality Date  . ABDOMINAL HYSTERECTOMY  1990  . BACK SURGERY  2011  . BREAST EXCISIONAL BIOPSY Left    2010?   Marland Kitchen CATARACT EXTRACTION, BILATERAL    . COLONOSCOPY  1998  . EYE SURGERY Bilateral 05/06/14  . JOINT REPLACEMENT     LT KNEE  . KNEE SURGERY Left 2012  . OOPHORECTOMY  1990  . parathyroid transplant     2/3 THYROID REMOVED   (GOITER)  . SPINE SURGERY    . THYROID SURGERY    . TONSILLECTOMY    . TOTAL SHOULDER ARTHROPLASTY Left 01/14/2016   Procedure: LEFT TOTAL SHOULDER ARTHROPLASTY;  Surgeon: Justice Britain, MD;  Location: [place];  Service: Orthopedics;  Laterality: Left;  . TOTAL SHOULDER ARTHROPLASTY Right 11/03/2016   Procedure: RIGHT TOTAL SHOULDER ARTHROPLASTY;  Surgeon: Justice Britain, MD;  Location: [place] [facility];  Service: Orthopedics;  Laterality: Right;  . ULNAR NERVE REPAIR     LEFT  .  vein closure procedure Left 2010    Family History  Problem Relation Age of Onset  . Heart disease Mother        MI  . Arthritis Mother   . Stroke Mother   . Hyperlipidemia Mother   . Heart disease Father        MI  . Hyperlipidemia Father   . Diabetes Sister   . Hyperlipidemia Brother     Social History:  reports that she quit smoking about 28 years ago. She has a 75.00 pack-year smoking history. She has never used smokeless tobacco. She reports that she drank alcohol. She reports that she does not use drugs.  Patient is a former 3 pack per day smoker for 25 years (75 pack year). Patient lives in [place].  She is a former Electrical engineer. Patient denies known exposures to radiation on toxins. The patient is accompanied by her daughter today.   Another daughter is on the cell phone via speaker phone.  Allergies:  Allergies  Allergen Reactions  . Codeine Other (See Comments)    hallucinations    . Simvastatin Diarrhea and Nausea Only    Current Medications: Current Outpatient Medications  Medication Sig Dispense Refill  . acetaminophen (TYLENOL) 325 MG tablet Take 650 mg by mouth every 6 (six) hours as needed.    Marland Kitchen aspirin EC 81 MG tablet Take 1 tablet (81 mg total) by mouth daily. 90 tablet 3  . atorvastatin (LIPITOR) 20 MG tablet Take 1 tablet (20 mg total) by mouth daily. 90 tablet 4  . benazepril (LOTENSIN) 20 MG tablet Take 1 tablet (20 mg total) by mouth daily. 90 tablet 4  . levothyroxine (SYNTHROID, LEVOTHROID) 100 MCG tablet Take 1 tablet (100 mcg total) by mouth daily. 90 tablet 3  . Multiple Vitamins-Minerals (CENTRUM SILVER ULTRA WOMENS PO) Take 1 tablet by mouth daily.     Marland Kitchen triamcinolone cream (KENALOG) 0.1 % Apply 1 application topically 2 (two) times daily. 30 g 0   No current facility-administered medications for this visit.     Review of Systems  Constitutional: Negative for chills, diaphoresis, fever, malaise/fatigue and weight loss (stable).       Feels "ok".  HENT: Negative.  Negative for congestion, ear discharge, ear pain, nosebleeds, sinus pain and sore throat.   Eyes: Negative.  Negative for double vision, photophobia, pain, discharge and redness.  Respiratory: Positive for cough (chronic) and shortness of breath (exertional). Negative for hemoptysis and sputum production.        COPD  Cardiovascular: Negative.  Negative for chest pain, palpitations, orthopnea, leg swelling and PND.  Gastrointestinal: Positive for diarrhea (loose stools for "years". Refuses conolonscopy). Negative for abdominal pain, blood in stool, constipation, melena, nausea and vomiting.   Genitourinary: Negative for dysuria, frequency, hematuria and urgency.  Musculoskeletal: Positive for back pain and joint pain (right hip aches). Negative for falls and myalgias.  Skin: Negative.  Negative for itching and rash.  Neurological: Negative for dizziness, tremors, weakness and headaches.  Endo/Heme/Allergies: Does not bruise/bleed easily.       PMH (+) for HYPOthyroidism - on levothyroxine  Psychiatric/Behavioral: Negative for depression, memory loss and suicidal ideas. The patient is nervous/anxious. The patient does not have insomnia.   All other systems reviewed and are negative.  Performance status (ECOG): 1  Vital Signs BP (!) 148/81 (BP Location: Left Arm, Patient Position: Sitting)   Pulse 79   Temp (!) 97 F (36.1 C) (Tympanic)   Resp 18  Wt 175 lb 14.4 oz (79.8 kg)   BMI 28.39 kg/m   Physical Exam  Constitutional: She is oriented to person, place, and time and well-developed, well-nourished, and in no distress. No distress.  HENT:  Head: Normocephalic and atraumatic.  Long brown hair.  Eyes: Conjunctivae and EOM are normal. No scleral icterus.  Blue eyes.   Pulmonary/Chest: Effort normal.  Neurological: She is alert and oriented to person, place, and time.  Skin: She is not diaphoretic.  Psychiatric: Mood, affect and judgment normal.  Nursing note and vitals reviewed.   No visits with results within 3 Day(s) from this visit.  Latest known visit with results is:  Orders Only on 01/26/2018  Component Date Value Ref Range Status  . IgG (Immunoglobin G), Serum 01/26/2018 1,795* 700 - 1,600 mg/dL Final   Comment: (NOTE) Performed At: [facility] [place], [place] [id number] Rush Farmer MD [id number]   . Total Protein ELP 01/26/2018 7.5  6.0 - 8.5 g/dL Final  . Albumin ELP 01/26/2018 3.8  2.9 - 4.4 g/dL Final  . Alpha-1-Globulin 01/26/2018 0.3  0.0 - 0.4 g/dL Final  . Alpha-2-Globulin 01/26/2018 1.0  0.4 - 1.0 g/dL Final   . Beta Globulin 01/26/2018 1.0  0.7 - 1.3 g/dL Final  . Gamma Globulin 01/26/2018 1.5  0.4 - 1.8 g/dL Final  . M-Spike, % 01/26/2018 1.3* Not Observed g/dL Final  . SPE Interp. 01/26/2018 Comment   Final   Comment: (NOTE) The SPE pattern demonstrates a single peak (M-spike) in the gamma region which may represent monoclonal protein. This peak may also be caused by circulating immune complexes, cryoglobulins, C-reactive protein, fibrinogen or hemolysis.  If clinically indicated, the presence of a monoclonal gammopathy may be confirmed by immuno- fixation, as well as an evaluation of the urine for the presence of Bence-Jones protein. Performed At: [facility] [place], [place] [id number] Rush Farmer MD [id number]   . Comment 01/26/2018 Comment   Final   Comment: (NOTE) Protein electrophoresis scan will follow via computer, mail, or courier delivery.   Marland Kitchen GLOBULIN, TOTAL 01/26/2018 3.7  2.2 - 3.9 g/dL Corrected  . A/G Ratio 01/26/2018 1.0  0.7 - 1.7 Corrected  . Sodium 01/26/2018 135  135 - 145 mmol/L Final  . Potassium 01/26/2018 4.3  3.5 - 5.1 mmol/L Final  . Chloride 01/26/2018 104  98 - 111 mmol/L Final  . CO2 01/26/2018 23  22 - 32 mmol/L Final  . Glucose, Bld 01/26/2018 107* 70 - 99 mg/dL Final  . BUN 01/26/2018 25* 8 - 23 mg/dL Final  . Creatinine, Ser 01/26/2018 1.50* 0.44 - 1.00 mg/dL Final  . Calcium 01/26/2018 9.0  8.9 - 10.3 mg/dL Final  . Total Protein 01/26/2018 7.9  6.5 - 8.1 g/dL Final  . Albumin 01/26/2018 4.1  3.5 - 5.0 g/dL Final  . AST 01/26/2018 22  15 - 41 U/L Final  . ALT 01/26/2018 14  0 - 44 U/L Final  . Alkaline Phosphatase 01/26/2018 75  38 - 126 U/L Final  . Total Bilirubin 01/26/2018 0.5  0.3 - 1.2 mg/dL Final  . GFR calc non Af Amer 01/26/2018 33* >60 mL/min Final  . GFR calc Af Amer 01/26/2018 38* >60 mL/min Final   Comment: (NOTE) The eGFR has been calculated using the CKD EPI equation. This calculation has not been  validated in all clinical situations. eGFR's persistently <60 mL/min signify possible Chronic Kidney Disease.   . Anion gap 01/26/2018 8  5 - 15 Final   Performed at [facility], [address], [place], [place] [address]  . WBC 01/26/2018 4.6  3.6 - 11.0 K/uL Final  . RBC 01/26/2018 2.87* 3.80 - 5.20 MIL/uL Final  . Hemoglobin 01/26/2018 9.8* 12.0 - 16.0 g/dL Final  . HCT 01/26/2018 29.0* 35.0 - 47.0 % Final  . MCV 01/26/2018 101.1* 80.0 - 100.0 fL Final  . MCH 01/26/2018 34.0  26.0 - 34.0 pg Final  . MCHC 01/26/2018 33.6  32.0 - 36.0 g/dL Final  . RDW 01/26/2018 14.0  11.5 - 14.5 % Final  . Platelets 01/26/2018 229  150 - 440 K/uL Final  . Neutrophils Relative % 01/26/2018 59  % Final  . Neutro Abs 01/26/2018 2.8  1.4 - 6.5 K/uL Final  . Lymphocytes Relative 01/26/2018 27  % Final  . Lymphs Abs 01/26/2018 1.2  1.0 - 3.6 K/uL Final  . Monocytes Relative 01/26/2018 8  % Final  . Monocytes Absolute 01/26/2018 0.3  0.2 - 0.9 K/uL Final  . Eosinophils Relative 01/26/2018 6  % Final  . Eosinophils Absolute 01/26/2018 0.3  0 - 0.7 K/uL Final  . Basophils Relative 01/26/2018 0  % Final  . Basophils Absolute 01/26/2018 0.0  0 - 0.1 K/uL Final   Performed at [facility], [address] [place], [place] [address]    Assessment:  ZALEAH TERNES is a 77 y.o. female with smoldering multiple myeloma.  SPEP on 06/21/2017 revealed a 1.2 gm/dL monoclonal spike.  Random urine revealed 4.6 mg/dL protein and a 40.6% M-spike.  Calcium, albumin, and serum protein were normal.  Work-up on 06/30/2017 revealed a hematocrit of 31.1, hemoglobin 10.4, MCV 97.4, platelets 227,000, WBC 5700 with an ANC of 3300.  SPEP revealed a 1.1 gm/dL IgG monoclonal protein with lambda light chain specificity and monoclonal free lambda light chains (Bence-Jones protein).  IgG was 1660 ([phone]) and IgA was 48 (64-422).  Kappa free light chains were 23.7, lambda free light chains were 142.2 and ratio 0.17  (0.26-1.65).  Beta2-microglobulin was 4.7 (0.6-2.4).  Normal studies included:  B12, folate, and ferritin (75).  TSH was 0.204 (low).  24 hour UPEP revealed kappa free light chains 28.80 mg/L, lambda free light chains 63.80 mg/L and ration 0.45 (2.04 - 10.37).  M spike was 27.4% (16 mg/24 hours).  Bone survey on 06/30/2017 revealed no suspicious focal bony lesions.  Bone marrow aspirate and biopsy on 07/26/2017 revealed a slightly hypercellular marrow for age with trilineage hematopoiesis.  There was 13% plasmacytosis.  IHC stains for CD138 and in situ hybridization for kappa and lambda revealed interstitial cells and small clusters.  Lambda light chains appeared restricted.  Features were felt compatible with plasma cell neoplasm.  PET scan on 08/14/2017 revealed no hypermetabolic lesions of myeloma.  There was a 3.6 cm infrarenal aortic aneurysm.  Ultrasound in 2 years was recommended.  She had a 4 mm LLL nodule too small to characterize.  SPEP has been followed: 1.1 on 06/30/2017, 1.2 on 11/14/2017, 1.3 on 01/26/2018.  IgG has been followed:  1660 on 06/30/2017, 1684 on 11/14/2017, and 1795 on 01/26/2018.  Chest CT on 01/24/2018 revealed persistent LLL nodule.  Area calcified and felt to be consistent with a granuloma.  There were no other suspicious pulmonary nodules.  There was new Schmorl's node formation involving the inferior endplate of D31.  There were stable small scattered osseous lesions related to underlying multiple myeloma.  She has stage III chronic kidney  disease (Cr 1.55; CrCl 32.3 ml/min).     She has a normocytic anemia.  Iron studies were normal on 06/21/2017.  Appetite is poor.  Last colonoscopy was > 10 years ago (she declines repeat).  She has a 75 pack year smoking history.  She has COPD.  Chest CT without contrast on 07/25/2017 revealed no suspicious pulmonary nodules.  Symptomatically,    Plan: 1. Review labs and imaging from 01/26/2018 2. Smoldering multiple  myeloma  M-spike 1.3.   IgG 1795 mg/dL.  Discussed recent CT imaging with radiologist. Imaging compared to previous PET from 08/14/2017 due to mention of osseous lesions.  No progression. 3. Groin pain  Review diagnostic plain films of the pelvis and RIGHT femur. Imaging personally reviewed and felt to be consistent with the dictated radiology report. Imaging reviewed with patient.   Radiographs of pelvis and RIGHT femur demonstrated changes related to OA.   No osseous lytic lesions noted.  Review vascular US with ABI assessment done on 02/02/2018  Mild RIGHT lower extremity arterial disease (TBI normal).  Moderate LEFT lower extremity arterial disease (TBI abnormal).  4. Shortness of breath  Chronic in nature, however noted to be worsening as of late.   Encouraged to discuss with PCP. She has COPD, and may benefit from MDI or SVN medications.  5. Nutrition  Weight today is 175 lb 14.4 oz (79.8 kg). Body mass index is 28.39 kg/m.  Patient reportedly only eating 1 meal a day citing the fact that that is "all she wants".    Encouraged her to increase her intake of calorie and protein dense food choices.   If unable to eat, encourage patient to utilize nutritional supplement shakes at least 2-3 times a day.   Will continue to monitor for further losses or signs of protein calorie malnutrition.  6. RTC in 3 months for MD assessment and labs (CBC, CMP, SPEP).   Honor Loh, NP 02/05/18, 5:04 PM   I saw and evaluated the patient, participating in the key portions of the service and reviewing pertinent diagnostic studies and records.  I reviewed the nurse practitioner's note and agree with the findings and the plan.  The assessment and plan were discussed with the patient. Several questions were asked by the patient and answered.   Nolon Stalls, MD 02/05/18, 5:04 PM

## 2018-02-05 ENCOUNTER — Encounter: Payer: Self-pay | Admitting: Hematology and Oncology

## 2018-02-05 ENCOUNTER — Inpatient Hospital Stay (HOSPITAL_BASED_OUTPATIENT_CLINIC_OR_DEPARTMENT_OTHER): Payer: Medicare Other | Admitting: Hematology and Oncology

## 2018-02-05 ENCOUNTER — Other Ambulatory Visit: Payer: Self-pay

## 2018-02-05 VITALS — BP 148/81 | HR 79 | Temp 97.0°F | Resp 18 | Wt 175.9 lb

## 2018-02-05 DIAGNOSIS — Z7902 Long term (current) use of antithrombotics/antiplatelets: Secondary | ICD-10-CM

## 2018-02-05 DIAGNOSIS — E1122 Type 2 diabetes mellitus with diabetic chronic kidney disease: Secondary | ICD-10-CM

## 2018-02-05 DIAGNOSIS — N183 Chronic kidney disease, stage 3 (moderate): Secondary | ICD-10-CM

## 2018-02-05 DIAGNOSIS — J449 Chronic obstructive pulmonary disease, unspecified: Secondary | ICD-10-CM

## 2018-02-05 DIAGNOSIS — M8580 Other specified disorders of bone density and structure, unspecified site: Secondary | ICD-10-CM

## 2018-02-05 DIAGNOSIS — D472 Monoclonal gammopathy: Secondary | ICD-10-CM

## 2018-02-05 DIAGNOSIS — I129 Hypertensive chronic kidney disease with stage 1 through stage 4 chronic kidney disease, or unspecified chronic kidney disease: Secondary | ICD-10-CM | POA: Diagnosis not present

## 2018-02-05 DIAGNOSIS — E039 Hypothyroidism, unspecified: Secondary | ICD-10-CM

## 2018-02-05 DIAGNOSIS — C9000 Multiple myeloma not having achieved remission: Secondary | ICD-10-CM

## 2018-02-05 DIAGNOSIS — R1030 Lower abdominal pain, unspecified: Secondary | ICD-10-CM | POA: Diagnosis not present

## 2018-02-05 DIAGNOSIS — R072 Precordial pain: Secondary | ICD-10-CM

## 2018-02-05 DIAGNOSIS — Z79899 Other long term (current) drug therapy: Secondary | ICD-10-CM

## 2018-02-05 DIAGNOSIS — E785 Hyperlipidemia, unspecified: Secondary | ICD-10-CM

## 2018-02-05 DIAGNOSIS — M1990 Unspecified osteoarthritis, unspecified site: Secondary | ICD-10-CM

## 2018-02-05 NOTE — Progress Notes (Signed)
Here for follow up  Overall stated " feeling ok -except for arthritic  pain"

## 2018-02-06 ENCOUNTER — Encounter: Payer: Self-pay | Admitting: Cardiovascular Disease

## 2018-02-06 ENCOUNTER — Ambulatory Visit (INDEPENDENT_AMBULATORY_CARE_PROVIDER_SITE_OTHER): Payer: Medicare Other | Admitting: Cardiovascular Disease

## 2018-02-06 VITALS — BP 132/60 | HR 66 | Ht 65.5 in | Wt 176.2 lb

## 2018-02-06 DIAGNOSIS — I10 Essential (primary) hypertension: Secondary | ICD-10-CM | POA: Diagnosis not present

## 2018-02-06 DIAGNOSIS — R0602 Shortness of breath: Secondary | ICD-10-CM | POA: Diagnosis not present

## 2018-02-06 DIAGNOSIS — I714 Abdominal aortic aneurysm, without rupture, unspecified: Secondary | ICD-10-CM

## 2018-02-06 DIAGNOSIS — E785 Hyperlipidemia, unspecified: Secondary | ICD-10-CM

## 2018-02-06 DIAGNOSIS — I779 Disorder of arteries and arterioles, unspecified: Secondary | ICD-10-CM | POA: Diagnosis not present

## 2018-02-06 DIAGNOSIS — I739 Peripheral vascular disease, unspecified: Secondary | ICD-10-CM

## 2018-02-06 NOTE — Progress Notes (Signed)
Cardiology Office Note   Date:  02/06/2018   ID:  [name], DOB [birth year], MRN [id number]  PCP:  Guadalupe Maple, MD  Cardiologist:   Kathlyn [place], MD   Chief Complaint  Patient presents with  . other    12 month f/u pt would like to discuss results/AAA. Meds reviewed verbally with pt.      History of Present Illness: Bailey Ayala is a 77 y.o. female who is here today for follow-up visit regarding peripheral arterial disease. She has chronic medical conditions that include peripheral arterial disease with intermittent claudication, previous tobacco use, hyperlipidemia, COPD and hypertension.  She had a nuclear stress test done in July 2017 for exertional dyspnea which showed no evidence of ischemia with normal ejection fraction. The patient has known history of bilateral calf claudication due to SFA disease. ABI in 2017 was 0.57 on the right and 0.49 on the left. Duplex showed occlusion of the proximal to mid left SFA and borderline significant right SFA disease.  The patient developed a rash with cilostazol which was discontinued.  She was recently diagnosed with smoldering multiple myeloma which is being monitored by oncology.  She has not required treatment yet. She had recent right groin pain of unclear etiology.  She reports stable calf claudication on the left side.  No chest pain but she does complain of worsening exertional dyspnea. She had recent vascular studies done which showed improvement in ABIs to 0.93 on the right and 0.64 on the left.  Past Medical History:  Diagnosis Date  . Arthritis   . Blood dyscrasia    BLEEDS EASILY   . COPD (chronic obstructive pulmonary disease) ([place])   . Diabetes mellitus without complication ([place])   . Diffuse cystic mastopathy   . Hyperlipidemia   . Hypertension   . Hypothyroidism   . L4-L5 disc bulge   . Multiple myeloma ([place])   . Osteoporosis, post-menopausal   . PAD (peripheral artery disease) ([place])   .  Renal insufficiency   . Shortness of breath dyspnea    WITH EXERTION   . Stroke ([place])    TIA     2016     WAS FOUND ON SCAN ORDERED FROM NEUROL  . Thyroid disease   . TIA (transient ischemic attack) 2016   left hand weakness    Past Surgical History:  Procedure Laterality Date  . ABDOMINAL HYSTERECTOMY  1990  . BACK SURGERY  2011  . BREAST EXCISIONAL BIOPSY Left    2010?   Marland Kitchen CATARACT EXTRACTION, BILATERAL    . COLONOSCOPY  1998  . EYE SURGERY Bilateral 05/06/14  . JOINT REPLACEMENT     LT KNEE  . KNEE SURGERY Left 2012  . OOPHORECTOMY  1990  . parathyroid transplant     2/3 THYROID REMOVED   (GOITER)  . SPINE SURGERY    . THYROID SURGERY    . TONSILLECTOMY    . TOTAL SHOULDER ARTHROPLASTY Left 01/14/2016   Procedure: LEFT TOTAL SHOULDER ARTHROPLASTY;  Surgeon: Justice Britain, MD;  Location: [place];  Service: Orthopedics;  Laterality: Left;  . TOTAL SHOULDER ARTHROPLASTY Right 11/03/2016   Procedure: RIGHT TOTAL SHOULDER ARTHROPLASTY;  Surgeon: Justice Britain, MD;  Location: [place];  Service: Orthopedics;  Laterality: Right;  . ULNAR NERVE REPAIR     LEFT  . vein closure procedure Left 2010     Current Outpatient Medications  Medication Sig Dispense Refill  . acetaminophen (TYLENOL) 325 MG tablet Take  650 mg by mouth every 6 (six) hours as needed.    Marland Kitchen aspirin EC 81 MG tablet Take 1 tablet (81 mg total) by mouth daily. 90 tablet 3  . atorvastatin (LIPITOR) 20 MG tablet Take 1 tablet (20 mg total) by mouth daily. 90 tablet 4  . benazepril (LOTENSIN) 20 MG tablet Take 1 tablet (20 mg total) by mouth daily. 90 tablet 4  . levothyroxine (SYNTHROID, LEVOTHROID) 100 MCG tablet Take 1 tablet (100 mcg total) by mouth daily. 90 tablet 3  . Multiple Vitamins-Minerals (CENTRUM SILVER ULTRA WOMENS PO) Take 1 tablet by mouth daily.     Marland Kitchen triamcinolone cream (KENALOG) 0.1 % Apply 1 application topically 2 (two) times daily. 30 g 0   No current facility-administered medications for this  visit.     Allergies:   Codeine and Simvastatin    Social History:  The patient  reports that she quit smoking about 28 years ago. She has a 75.00 pack-year smoking history. She has never used smokeless tobacco. She reports that she drank alcohol. She reports that she does not use drugs.   Family History:  The patient's family history includes Arthritis in her mother; Diabetes in her sister; Heart disease in her father and mother; Hyperlipidemia in her brother, father, and mother; Stroke in her mother.    ROS:  Please see the history of present illness.   Otherwise, review of systems are positive for none.   All other systems are reviewed and negative.    PHYSICAL EXAM: VS:  BP 132/60 (BP Location: Left Arm, Patient Position: Sitting, Cuff Size: Normal)   Pulse 66   Ht 5' 5.5" (1.664 m)   Wt 176 lb 4 oz (79.9 kg)   BMI 28.88 kg/m  , BMI Body mass index is 28.88 kg/m. GEN: Well nourished, well developed, in no acute distress  HEENT: normal  Neck: no JVD, carotid bruits, or masses Cardiac: RRR; no murmurs, rubs, or gallops,no edema  Respiratory:  clear to auscultation bilaterally, normal work of breathing GI: soft, nontender, nondistended, + BS MS: no deformity or atrophy  Skin: warm and dry, no rash Neuro:  Strength and sensation are intact Psych: euthymic mood, full affect   EKG:  EKG is  ordered today.  EKG showed normal sinus rhythm with nonspecific ST changes. Possible old septal infarct which is not different from prior EKG.  Recent Labs: 08/01/2017: TSH 0.853 01/26/2018: ALT 14; BUN 25; Creatinine, Ser 1.50; Hemoglobin 9.8; Platelets 229; Potassium 4.3; Sodium 135    Lipid Panel    Component Value Date/Time   CHOL 157 12/28/2017 1451   TRIG 230 (H) 12/28/2017 1451   HDL 44 11/02/2016 1449   CHOLHDL 3.6 11/02/2016 1449   VLDL 46 (H) 12/28/2017 1451   LDLCALC 64 11/02/2016 1449      Wt Readings from Last 3 Encounters:  02/06/18 176 lb 4 oz (79.9 kg)  02/05/18  175 lb 14.4 oz (79.8 kg)  01/26/18 175 lb 6.4 oz (79.6 kg)      ASSESSMENT AND PLAN:   1. Peripheral arterial disease with moderate bilateral calf claudication worse on the left side:    Her symptoms are overall stable.  Continue medical therapy.  2. Hyperlipidemia: Continue treatment with atorvastatin. Most recent lipid profile in  July showed an LDL of 64. Triglyceride was mildly elevated at 262.  3. Essential hypertension: Blood pressure is controlled on current medication.   4.  Exertional dyspnea without chest pain: I requested an  echocardiogram.  5.  Small abdominal aortic aneurysm: This requires follow-up ultrasound in October 2020.   Disposition:   FU with me in 6 months.  Signed,  Kathlyn [place], MD  02/06/2018 2:03 PM    [place]

## 2018-02-06 NOTE — Patient Instructions (Addendum)
Medication Instructions:  No changes  If you need a refill on your cardiac medications before your next appointment, please call your pharmacy.   Testing/Procedures: Your physician has requested that you have an echocardiogram. Echocardiography is a painless test that uses sound waves to create images of your heart. It provides your doctor with information about the size and shape of your heart and how well your heart's chambers and valves are working. You may receive an ultrasound enhancing agent through an IV if needed to better visualize your heart during the echo.This procedure takes approximately one hour. There are no restrictions for this procedure. This will take place at the [facility] clinic.    Follow-Up: At [facility], you and your health needs are our priority.  As part of our continuing mission to provide you with exceptional heart care, we have created designated Provider Care Teams.  These Care Teams include your primary Cardiologist (physician) and Advanced Practice Providers (APPs -  Physician Assistants and Nurse Practitioners) who all work together to provide you with the care you need, when you need it. You will need a follow up appointment in 6 months.  Please call our office 2 months in advance to schedule this appointment.  You may see Dr. Fletcher Anon or one of the following Advanced Practice Providers on your designated Care Team:   Murray Hodgkins, NP Christell Faith, PA-C . Marrianne Mood, PA-C

## 2018-02-15 ENCOUNTER — Other Ambulatory Visit: Payer: Medicare Other

## 2018-02-21 ENCOUNTER — Ambulatory Visit (INDEPENDENT_AMBULATORY_CARE_PROVIDER_SITE_OTHER): Payer: Medicare Other

## 2018-02-21 DIAGNOSIS — R0602 Shortness of breath: Secondary | ICD-10-CM | POA: Diagnosis not present

## 2018-02-23 ENCOUNTER — Telehealth: Payer: Self-pay | Admitting: Cardiovascular Disease

## 2018-02-23 NOTE — Telephone Encounter (Signed)
Patient calling to check the status of ECHO results Please call to discuss

## 2018-02-23 NOTE — Telephone Encounter (Signed)
Let patient know we are waiting on Dr Fletcher Anon to review the results and will call her as soon as we can. She verbalized understanding.

## 2018-04-05 DIAGNOSIS — M25551 Pain in right hip: Secondary | ICD-10-CM | POA: Diagnosis not present

## 2018-04-23 DIAGNOSIS — C9000 Multiple myeloma not having achieved remission: Secondary | ICD-10-CM | POA: Diagnosis not present

## 2018-04-23 DIAGNOSIS — N183 Chronic kidney disease, stage 3 (moderate): Secondary | ICD-10-CM | POA: Diagnosis not present

## 2018-04-23 DIAGNOSIS — D631 Anemia in chronic kidney disease: Secondary | ICD-10-CM | POA: Diagnosis not present

## 2018-04-23 DIAGNOSIS — I10 Essential (primary) hypertension: Secondary | ICD-10-CM | POA: Diagnosis not present

## 2018-04-30 ENCOUNTER — Other Ambulatory Visit: Payer: Self-pay | Admitting: Family Medicine

## 2018-04-30 DIAGNOSIS — E039 Hypothyroidism, unspecified: Secondary | ICD-10-CM

## 2018-05-02 DIAGNOSIS — M25551 Pain in right hip: Secondary | ICD-10-CM | POA: Diagnosis not present

## 2018-05-10 ENCOUNTER — Inpatient Hospital Stay: Payer: Medicare Other | Attending: Hematology and Oncology

## 2018-05-10 ENCOUNTER — Inpatient Hospital Stay (HOSPITAL_BASED_OUTPATIENT_CLINIC_OR_DEPARTMENT_OTHER): Payer: Medicare Other | Admitting: Hematology and Oncology

## 2018-05-10 VITALS — BP 124/76 | HR 77 | Temp 97.6°F | Wt 169.5 lb

## 2018-05-10 DIAGNOSIS — J449 Chronic obstructive pulmonary disease, unspecified: Secondary | ICD-10-CM | POA: Diagnosis not present

## 2018-05-10 DIAGNOSIS — D539 Nutritional anemia, unspecified: Secondary | ICD-10-CM

## 2018-05-10 DIAGNOSIS — C9000 Multiple myeloma not having achieved remission: Secondary | ICD-10-CM | POA: Diagnosis not present

## 2018-05-10 DIAGNOSIS — R0781 Pleurodynia: Secondary | ICD-10-CM | POA: Diagnosis not present

## 2018-05-10 DIAGNOSIS — D649 Anemia, unspecified: Secondary | ICD-10-CM

## 2018-05-10 DIAGNOSIS — M25551 Pain in right hip: Secondary | ICD-10-CM

## 2018-05-10 DIAGNOSIS — N183 Chronic kidney disease, stage 3 (moderate): Secondary | ICD-10-CM

## 2018-05-10 DIAGNOSIS — R197 Diarrhea, unspecified: Secondary | ICD-10-CM | POA: Diagnosis not present

## 2018-05-10 DIAGNOSIS — M1990 Unspecified osteoarthritis, unspecified site: Secondary | ICD-10-CM | POA: Insufficient documentation

## 2018-05-10 DIAGNOSIS — I714 Abdominal aortic aneurysm, without rupture: Secondary | ICD-10-CM | POA: Diagnosis not present

## 2018-05-10 DIAGNOSIS — R634 Abnormal weight loss: Secondary | ICD-10-CM | POA: Insufficient documentation

## 2018-05-10 DIAGNOSIS — M898X9 Other specified disorders of bone, unspecified site: Secondary | ICD-10-CM

## 2018-05-10 DIAGNOSIS — D472 Monoclonal gammopathy: Secondary | ICD-10-CM

## 2018-05-10 LAB — COMPREHENSIVE METABOLIC PANEL
ALT: 14 U/L (ref 0–44)
AST: 18 U/L (ref 15–41)
Albumin: 3.9 g/dL (ref 3.5–5.0)
Alkaline Phosphatase: 77 U/L (ref 38–126)
Anion gap: 12 (ref 5–15)
BUN: 42 mg/dL — ABNORMAL HIGH (ref 8–23)
CO2: 21 mmol/L — ABNORMAL LOW (ref 22–32)
Calcium: 9 mg/dL (ref 8.9–10.3)
Chloride: 102 mmol/L (ref 98–111)
Creatinine, Ser: 1.68 mg/dL — ABNORMAL HIGH (ref 0.44–1.00)
GFR calc Af Amer: 34 mL/min — ABNORMAL LOW (ref >60)
GFR calc non Af Amer: 29 mL/min — ABNORMAL LOW (ref >60)
Glucose, Bld: 101 mg/dL — ABNORMAL HIGH (ref 70–99)
Potassium: 4.2 mmol/L (ref 3.5–5.1)
Sodium: 135 mmol/L (ref 135–145)
Total Bilirubin: 0.5 mg/dL (ref 0.3–1.2)
Total Protein: 8 g/dL (ref 6.5–8.1)

## 2018-05-10 LAB — CBC WITH DIFFERENTIAL/PLATELET
Abs Immature Granulocytes: 0.02 10*3/uL (ref 0.00–0.07)
Basophils Absolute: 0 10*3/uL (ref 0.0–0.1)
Basophils Relative: 0 %
Eosinophils Absolute: 0.2 10*3/uL (ref 0.0–0.5)
Eosinophils Relative: 3 %
HCT: 29.3 % — ABNORMAL LOW (ref 36.0–46.0)
Hemoglobin: 9.3 g/dL — ABNORMAL LOW (ref 12.0–15.0)
Immature Granulocytes: 0 %
Lymphocytes Relative: 31 %
Lymphs Abs: 1.5 10*3/uL (ref 0.7–4.0)
MCH: 33.3 pg (ref 26.0–34.0)
MCHC: 31.7 g/dL (ref 30.0–36.0)
MCV: 105 fL — ABNORMAL HIGH (ref 80.0–100.0)
Monocytes Absolute: 0.7 10*3/uL (ref 0.1–1.0)
Monocytes Relative: 14 %
Neutro Abs: 2.5 10*3/uL (ref 1.7–7.7)
Neutrophils Relative %: 52 %
Platelets: 246 10*3/uL (ref 150–400)
RBC: 2.79 MIL/uL — ABNORMAL LOW (ref 3.87–5.11)
RDW: 13.9 % (ref 11.5–15.5)
WBC: 4.8 10*3/uL (ref 4.0–10.5)
nRBC: 0 % (ref 0.0–0.2)

## 2018-05-10 LAB — RETICULOCYTES
Immature Retic Fract: 14.9 % (ref 2.3–15.9)
RBC.: 2.81 MIL/uL — ABNORMAL LOW (ref 3.87–5.11)
Retic Count, Absolute: 44.1 10*3/uL (ref 19.0–186.0)
Retic Ct Pct: 1.6 % (ref 0.4–3.1)

## 2018-05-10 LAB — IRON AND TIBC
Iron: 62 ug/dL (ref 28–170)
Saturation Ratios: 24 % (ref 10.4–31.8)
TIBC: 259 ug/dL (ref 250–450)
UIBC: 197 ug/dL

## 2018-05-10 LAB — TSH: TSH: 0.514 u[IU]/mL (ref 0.350–4.500)

## 2018-05-10 LAB — FERRITIN: Ferritin: 102 ng/mL (ref 11–307)

## 2018-05-10 LAB — FOLATE: Folate: 76 ng/mL (ref >5.9)

## 2018-05-10 LAB — VITAMIN B12: Vitamin B-12: 809 pg/mL (ref 180–914)

## 2018-05-10 NOTE — Progress Notes (Signed)
[facility] day:  05/10/2018   Chief Complaint: Bailey Ayala is a 78 y.o. female with smoldering myeloma who is seen for 1 week assessment.   HPI:  The patient was last seen in the medical oncology clinic on 02/05/2018.  At that time, she noted right hip aching sensation. Plain films revealed changes c/w osteoarthritis. She had mild right lower and moderate left lower vascular disease.  During the interim, patient with complaints of pain in her RIGHT hip. She received a cortisone injection on 05/02/2018, which was effective until 05/09/2018. Patient is scheduled to follow up with her orthopedic provider Ihor Gully, MD) next week for consultation prior to hip replacement.   Symptomatically, patient has been having pain in her BILATERAL ribs (L>R) for the last 1-1.5 months. She denies any trauma. She denies any fevers, sweats, or infections. She has chronic cramping in her BILATERAL lower extremities related to her known PVD. She has chronic back pain. Pain effects her sleep quality.   Patient has chronic, yet stable, cough and exertional shortness of breath. She continues to have loose stools that have been persistent "forever".   Patient advises that she maintains a labile appetite. She is not eating well per her daughter. Patient drinks an insufficient amount of fluids on a daily basis (3 cups of coffee and 32 oz water).  Weight today is 169 lb 8.5 oz (76.9 kg), which compared to her last visit to the clinic, represents a 6 pound weight loss.   Patient denies pain in the clinic today.   Past Medical History:  Diagnosis Date  . Arthritis   . Blood dyscrasia    BLEEDS EASILY   . COPD (chronic obstructive pulmonary disease) ([place])   . Diabetes mellitus without complication ([place])   . Diffuse cystic mastopathy   . Hyperlipidemia   . Hypertension   . Hypothyroidism   . L4-L5 disc bulge   . Multiple myeloma ([place])   . Osteoporosis, post-menopausal    . PAD (peripheral artery disease) ([place])   . Renal insufficiency   . Shortness of breath dyspnea    WITH EXERTION   . Stroke ([place])    TIA     2016     WAS FOUND ON SCAN ORDERED FROM [facility]UR[facility]  . Thyroid disease   . TIA (transient ischemic attack) 2016   left hand weakness    Past Surgical History:  Procedure Laterality Date  . ABDOMINAL HYSTERECTOMY  1990  . BACK SURGERY  2011  . BREAST EXCISIONAL BIOPSY Left    2010?   Marland Kitchen CATARACT EXTRACTION, BILATERAL    . COLONOSCOPY  1998  . EYE SURGERY Bilateral 05/06/14  . JOINT REPLACEMENT     LT KNEE  . KNEE SURGERY Left 2012  . OOPHORECTOMY  1990  . parathyroid transplant     2/3 THYROID REMOVED   ([facility])  . SPINE SURGERY    . THYROID SURGERY    . TONSILLECTOMY    . TOTAL SHOULDER ARTHROPLASTY Left 01/14/2016   Procedure: LEFT TOTAL SHOULDER ARTHROPLASTY;  Surgeon: Justice Britain, MD;  Location: [facility];  Service: Orthopedics;  Laterality: Left;  . TOTAL SHOULDER ARTHROPLASTY Right 11/03/2016   Procedure: RIGHT TOTAL SHOULDER ARTHROPLASTY;  Surgeon: Justice Britain, MD;  Location: [facility];  Service: Orthopedics;  Laterality: Right;  . ULNAR NERVE REPAIR     LEFT  . vein closure procedure Left 2010    Family History  Problem Relation Age of Onset  .  Heart disease Mother        MI  . Arthritis Mother   . Stroke Mother   . Hyperlipidemia Mother   . Heart disease Father        MI  . Hyperlipidemia Father   . Diabetes Sister   . Hyperlipidemia Brother     Social History:  reports that she quit smoking about 28 years ago. She has a 75.00 pack-year smoking history. She has never used smokeless tobacco. She reports previous alcohol use. She reports that she does not use drugs.  Patient is a former 3 pack per day smoker for 25 years (75 pack year). Patient lives in [place]. She is a former Electrical engineer. Patient denies known exposures to radiation on toxins. The patient is accompanied by her daughter today.   Another daughter [name]) is on the  cell phone via speaker phone.  Allergies:  Allergies  Allergen Reactions  . Codeine Other (See Comments)    hallucinations    . Simvastatin Diarrhea and Nausea Only    Current Medications: Current Outpatient Medications  Medication Sig Dispense Refill  . acetaminophen (TYLENOL) 325 MG tablet Take 650 mg by mouth every 6 (six) hours as needed.    Marland Kitchen aspirin EC 81 MG tablet Take 1 tablet (81 mg total) by mouth daily. 90 tablet 3  . atorvastatin (LIPITOR) 20 MG tablet Take 1 tablet (20 mg total) by mouth daily. 90 tablet 4  . benazepril (LOTENSIN) 20 MG tablet Take 1 tablet (20 mg total) by mouth daily. 90 tablet 4  . levothyroxine (SYNTHROID, LEVOTHROID) 100 MCG tablet Take 1 tablet (100 mcg total) by mouth daily. 90 tablet 3  . Multiple Vitamins-Minerals (CENTRUM SILVER ULTRA WOMENS PO) Take 1 tablet by mouth daily.     Marland Kitchen triamcinolone cream (KENALOG) 0.1 % Apply 1 application topically 2 (two) times daily. 30 g 0   No current facility-administered medications for this visit.     Review of Systems  Constitutional: Negative for chills, diaphoresis, fever and malaise/fatigue. Weight loss: 6 pounds.  HENT: Positive for hearing loss. Negative for congestion, ear discharge, ear pain, nosebleeds, sinus pain and sore throat.   Eyes: Negative.  Negative for double vision, photophobia, pain, discharge and redness.  Respiratory: Positive for cough (chronic) and shortness of breath (exertional). Negative for hemoptysis and sputum production.        COPD.  Cardiovascular: Negative.  Negative for chest pain, palpitations, orthopnea, leg swelling and PND.  Gastrointestinal: Positive for diarrhea (loose stools for "years". Refuses colonoscopy) and nausea. Negative for abdominal pain, blood in stool, constipation, heartburn, melena and vomiting.       Not eating well.  Chronic loose stools.  Genitourinary: Negative.  Negative for dysuria, frequency, hematuria and urgency.  Musculoskeletal: Positive  for back pain and joint pain (pain in right hip improved after cortisone injection). Negative for falls, myalgias and neck pain.       Feels like needs a hip replacement.  Pain in ribs.  Cramping in lower extremities.  Skin: Negative.  Negative for itching and rash.  Neurological: Negative.  Negative for dizziness, tremors, speech change, focal weakness, weakness and headaches.  Endo/Heme/Allergies: Does not bruise/bleed easily.       HYPOthyroidism - on levothyroxine.  Psychiatric/Behavioral: Negative for depression and memory loss. The patient is nervous/anxious. The patient does not have insomnia.   All other systems reviewed and are negative.  Performance status (ECOG): 1  Vital Signs BP 124/76 (BP Location: Right Arm,  Patient Position: Sitting)   Pulse 77   Temp 97.6 F (36.4 C) (Oral)   Wt 169 lb 8.5 oz (76.9 kg)   SpO2 98%   BMI 27.78 kg/m   Physical Exam  Constitutional: She is oriented to person, place, and time and well-developed, well-nourished, and in no distress. No distress.  HENT:  Head: Normocephalic and atraumatic.  Mouth/Throat: Oropharynx is clear and moist. No oropharyngeal exudate.  Long brown hair.  Hearing aide.  Eyes: Pupils are equal, round, and reactive to light. Conjunctivae and EOM are normal. No scleral icterus.  Blue eyes.   Neck: Normal range of motion. Neck supple. No JVD present.  Cardiovascular: Normal rate, normal heart sounds and intact distal pulses. Exam reveals no gallop.  No murmur heard. Pulmonary/Chest: Effort normal and breath sounds normal. No respiratory distress. She has no wheezes. She has no rales.  Left lateral and posterior ribs tender.  Abdominal: Soft. Bowel sounds are normal. She exhibits no distension and no mass. There is no abdominal tenderness. There is no rebound and no guarding.  Musculoskeletal: Normal range of motion.        General: No tenderness or edema.  Lymphadenopathy:    She has no cervical adenopathy.   Neurological: She is alert and oriented to person, place, and time.  Skin: Skin is warm and dry. No rash noted. She is not diaphoretic. No erythema. No pallor.  Psychiatric: Mood, affect and judgment normal.  Nursing note and vitals reviewed.   Appointment on 05/10/2018  Component Date Value Ref Range Status  . Sodium 05/10/2018 135  135 - 145 mmol/L Final  . Potassium 05/10/2018 4.2  3.5 - 5.1 mmol/L Final  . Chloride 05/10/2018 102  98 - 111 mmol/L Final  . CO2 05/10/2018 21* 22 - 32 mmol/L Final  . Glucose, Bld 05/10/2018 101* 70 - 99 mg/dL Final  . BUN 05/10/2018 42* 8 - 23 mg/dL Final  . Creatinine, Ser 05/10/2018 1.68* 0.44 - 1.00 mg/dL Final  . Calcium 05/10/2018 9.0  8.9 - 10.3 mg/dL Final  . Total Protein 05/10/2018 8.0  6.5 - 8.1 g/dL Final  . Albumin 05/10/2018 3.9  3.5 - 5.0 g/dL Final  . AST 05/10/2018 18  15 - 41 U/L Final  . ALT 05/10/2018 14  0 - 44 U/L Final  . Alkaline Phosphatase 05/10/2018 77  38 - 126 U/L Final  . Total Bilirubin 05/10/2018 0.5  0.3 - 1.2 mg/dL Final  . GFR calc non Af Amer 05/10/2018 29* >60 mL/min Final  . GFR calc Af Amer 05/10/2018 34* >60 mL/min Final  . Anion gap 05/10/2018 12  5 - 15 Final   Performed at [facility], [address], [place], [place] [address]  . WBC 05/10/2018 4.8  4.0 - 10.5 K/uL Final  . RBC 05/10/2018 2.79* 3.87 - 5.11 MIL/uL Final  . Hemoglobin 05/10/2018 9.3* 12.0 - 15.0 g/dL Final  . HCT 05/10/2018 29.3* 36.0 - 46.0 % Final  . MCV 05/10/2018 105.0* 80.0 - 100.0 fL Final  . MCH 05/10/2018 33.3  26.0 - 34.0 pg Final  . MCHC 05/10/2018 31.7  30.0 - 36.0 g/dL Final  . RDW 05/10/2018 13.9  11.5 - 15.5 % Final  . Platelets 05/10/2018 246  150 - 400 K/uL Final  . nRBC 05/10/2018 0.0  0.0 - 0.2 % Final  . Neutrophils Relative % 05/10/2018 52  % Final  . Neutro Abs 05/10/2018 2.5  1.7 - 7.7 K/uL Final  .  Lymphocytes Relative 05/10/2018 31  % Final  . Lymphs Abs 05/10/2018 1.5  0.7 - 4.0 K/uL Final  .  Monocytes Relative 05/10/2018 14  % Final  . Monocytes Absolute 05/10/2018 0.7  0.1 - 1.0 K/uL Final  . Eosinophils Relative 05/10/2018 3  % Final  . Eosinophils Absolute 05/10/2018 0.2  0.0 - 0.5 K/uL Final  . Basophils Relative 05/10/2018 0  % Final  . Basophils Absolute 05/10/2018 0.0  0.0 - 0.1 K/uL Final  . Immature Granulocytes 05/10/2018 0  % Final  . Abs Immature Granulocytes 05/10/2018 0.02  0.00 - 0.07 K/uL Final   Performed at [facility], [address], [place], [place] [address]    Assessment:  Bailey Ayala is a 78 y.o. female with smoldering multiple myeloma.  SPEP on 06/21/2017 revealed a 1.2 gm/dL monoclonal spike.  Random urine revealed 4.6 mg/dL protein and a 40.6% M-spike.  Calcium, albumin, and serum protein were normal.  Work-up on 06/30/2017 revealed a hematocrit of 31.1, hemoglobin 10.4, MCV 97.4, platelets 227,000, WBC 5700 with an ANC of 3300.  SPEP revealed a 1.1 gm/dL IgG monoclonal protein with lambda light chain specificity and monoclonal free lambda light chains (Bence-Jones protein).  IgG was 1660 ([id number]) and IgA was 48 (64-422).  Kappa free light chains were 23.7, lambda free light chains were 142.2 and ratio 0.17 (0.26-1.65).  Beta2-microglobulin was 4.7 (0.6-2.4).  Normal studies included:  B12, folate, and ferritin (75).  TSH was 0.204 (low).  24 hour UPEP revealed kappa free light chains 28.80 mg/L, lambda free light chains 63.80 mg/L and ration 0.45 (2.04 - 10.37).  M spike was 27.4% (16 mg/24 hours).  Bone survey on 06/30/2017 revealed no suspicious focal bony lesions.  Bone marrow aspirate and biopsy on 07/26/2017 revealed a slightly hypercellular marrow for age with trilineage hematopoiesis.  There was 13% plasmacytosis.  IHC stains for CD138 and in situ hybridization for kappa and lambda revealed interstitial cells and small clusters.  Lambda light chains appeared restricted.  Features were felt compatible with plasma cell  neoplasm.  PET scan on 08/14/2017 revealed no hypermetabolic lesions of myeloma.  There was a 3.6 cm infrarenal aortic aneurysm.  Ultrasound in 2 years was recommended.  She had a 4 mm LLL nodule too small to characterize.  SPEP has been followed: 1.1 on 06/30/2017, 1.2 on 11/14/2017, 1.3 on 01/26/2018.  IgG has been followed:  1660 on 06/30/2017, 1684 on 11/14/2017, and 1795 on 01/26/2018.  Chest CT on 01/24/2018 revealed persistent LLL nodule.  Area calcified and felt to be consistent with a granuloma.  There were no other suspicious pulmonary nodules.  There was new Schmorl's node formation involving the inferior endplate of N39.  There were stable small scattered osseous lesions related to underlying multiple myeloma.  She has stage III chronic kidney disease (Cr 1.55; CrCl 32.3 ml/min).     She has a normocytic anemia.  Iron studies were normal on 06/21/2017.  Appetite is poor.  Last colonoscopy was > 10 years ago (she declines repeat).  She has a 75 pack year smoking history.  She has COPD.  Chest CT without contrast on 07/25/2017 revealed no suspicious pulmonary nodules.  Symptomatically, she has a 1.5 month history of rib pain without trauma.   She has right hip pain s/p recent cortisone injection.  She continues to have loose stools. She has lost 6 pounds.  Exam reveals left sided rib tenderness.  M-spike is 1.2 gm/dL.  Hemoglobin is  9.3.  Creatinine is 1.68.  Albumen and protein are normal.  Plan: 1. Labs today:  CBC with diff, CMP, SPEP, B12, folate, ferritin, iron studies, retic, TSH, FLCA, 24 hour urine 2. Smoldering multiple myeloma Follow-up M-spike today. Discuss unclear etiology of rib and hip pain. Discuss unexplained weight loss. Discuss obtaining a PET scan for restaging purposes. Discuss collecting a 24 hour urine for UPEP. 3. Right hip pain Prior plain films revealed osteoarthritis without lytic lesions. Obtain PET scan as above.  4. Shortness of breath Patient notes  stable symptoms. Patient has COPD. 5. Weight loss Weight loss of 6 pounds. Patient has chronic loose stools and has declined colonoscopy. Appetite is poor. Encourage intake of calorie and protein dense food choices.  Encourage nutritional supplement shakes at least 2-3 times a day.  Continue to monitor for further losses or signs of protein calorie malnutrition.  6. Renal insufficiency  Creatinine 1.68 (above baseline of 1.55).  Patient sees Dr Holley Raring.  Last seen 1-2 weeks ago per patient. 7. Macrocytic anemia  Hemoglobin is 9.3.  MCV is 105.  Work-up today including B12, folate, TSH. 8. RTC after PET scan for MD assessment and discussion regarding direction of therapy.   Honor Loh, NP 05/10/18, 3:58 PM   I saw and evaluated the patient, participating in the key portions of the service and reviewing pertinent diagnostic studies and records.  I reviewed the nurse practitioner's note and agree with the findings and the plan.  The assessment and plan were discussed with the patient.  Multiple questions were asked by the patient and her family and answered.   Nolon Stalls, MD 05/10/18, 3:58 PM

## 2018-05-10 NOTE — Progress Notes (Signed)
Pt here for follow up. Pt reports she had a cortisone shot in right hip last week in hopes to avoid hip replacement. Pt's daughter expresses concerns with weight loss. Per last visit patient has lost around 7lbs.   Pt had slight wheeze upon getting to room, after resting wheeze was no longer audible. Pt denies any SOB at this time. States "only when I walk"

## 2018-05-11 LAB — PROTEIN ELECTROPHORESIS, SERUM
A/G Ratio: 1 (ref 0.7–1.7)
Albumin ELP: 3.6 g/dL (ref 2.9–4.4)
Alpha-1-Globulin: 0.3 g/dL (ref 0.0–0.4)
Alpha-2-Globulin: 1.1 g/dL — ABNORMAL HIGH (ref 0.4–1.0)
Beta Globulin: 0.9 g/dL (ref 0.7–1.3)
Gamma Globulin: 1.4 g/dL (ref 0.4–1.8)
Globulin, Total: 3.7 g/dL (ref 2.2–3.9)
M-Spike, %: 1.2 g/dL — ABNORMAL HIGH
Total Protein ELP: 7.3 g/dL (ref 6.0–8.5)

## 2018-05-13 DIAGNOSIS — M25551 Pain in right hip: Secondary | ICD-10-CM | POA: Diagnosis not present

## 2018-05-13 DIAGNOSIS — R634 Abnormal weight loss: Secondary | ICD-10-CM | POA: Diagnosis not present

## 2018-05-13 DIAGNOSIS — M1990 Unspecified osteoarthritis, unspecified site: Secondary | ICD-10-CM | POA: Diagnosis not present

## 2018-05-13 DIAGNOSIS — R0781 Pleurodynia: Secondary | ICD-10-CM | POA: Diagnosis not present

## 2018-05-13 DIAGNOSIS — C9000 Multiple myeloma not having achieved remission: Secondary | ICD-10-CM | POA: Diagnosis not present

## 2018-05-13 DIAGNOSIS — J449 Chronic obstructive pulmonary disease, unspecified: Secondary | ICD-10-CM | POA: Diagnosis not present

## 2018-05-14 ENCOUNTER — Other Ambulatory Visit: Payer: Self-pay

## 2018-05-14 DIAGNOSIS — C9000 Multiple myeloma not having achieved remission: Secondary | ICD-10-CM

## 2018-05-14 DIAGNOSIS — D472 Monoclonal gammopathy: Secondary | ICD-10-CM

## 2018-05-14 DIAGNOSIS — D539 Nutritional anemia, unspecified: Secondary | ICD-10-CM

## 2018-05-14 LAB — KAPPA/LAMBDA LIGHT CHAINS
Kappa free light chain: 18.6 mg/L (ref 3.3–19.4)
Kappa, lambda light chain ratio: 0.09 — ABNORMAL LOW (ref 0.26–1.65)
Lambda free light chains: 214.4 mg/L — ABNORMAL HIGH (ref 5.7–26.3)

## 2018-05-15 ENCOUNTER — Ambulatory Visit
Admission: RE | Admit: 2018-05-15 | Discharge: 2018-05-15 | Disposition: A | Discharge status: Home / Self Care | Payer: Medicare Other | Source: Ambulatory Visit | Attending: Urgent Care | Admitting: Urgent Care

## 2018-05-15 DIAGNOSIS — R9389 Abnormal findings on diagnostic imaging of other specified body structures: Secondary | ICD-10-CM | POA: Diagnosis not present

## 2018-05-15 DIAGNOSIS — I2510 Atherosclerotic heart disease of native coronary artery without angina pectoris: Secondary | ICD-10-CM | POA: Insufficient documentation

## 2018-05-15 DIAGNOSIS — I714 Abdominal aortic aneurysm, without rupture: Secondary | ICD-10-CM | POA: Diagnosis not present

## 2018-05-15 DIAGNOSIS — I700 Atherosclerosis of aorta: Secondary | ICD-10-CM | POA: Insufficient documentation

## 2018-05-15 DIAGNOSIS — R634 Abnormal weight loss: Secondary | ICD-10-CM | POA: Insufficient documentation

## 2018-05-15 DIAGNOSIS — C9000 Multiple myeloma not having achieved remission: Secondary | ICD-10-CM | POA: Insufficient documentation

## 2018-05-15 DIAGNOSIS — D472 Monoclonal gammopathy: Secondary | ICD-10-CM

## 2018-05-15 DIAGNOSIS — M898X9 Other specified disorders of bone, unspecified site: Secondary | ICD-10-CM | POA: Diagnosis not present

## 2018-05-15 LAB — GLUCOSE, CAPILLARY: Glucose-Capillary: 84 mg/dL (ref 70–99)

## 2018-05-15 MED ORDER — FLUDEOXYGLUCOSE F - 18 (FDG) INJECTION
8.2900 | Freq: Once | INTRAVENOUS | Status: AC | PRN
  Administered 2018-05-15: 8.29 via INTRAVENOUS

## 2018-05-16 LAB — IFE+PROTEIN ELECTRO, 24-HR UR
% BETA, Urine: 17.4 %
ALPHA 1 URINE: 3 %
Albumin, U: 7.4 %
Alpha 2, Urine: 6 %
GAMMA GLOBULIN URINE: 66.2 %
M-SPIKE %, Urine: 47.3 % — ABNORMAL HIGH
M-Spike, Mg/24 Hr: 39 mg/24 hr — ABNORMAL HIGH
Total Protein, Urine-Ur/day: 83 mg/24 hr (ref 30–150)
Total Protein, Urine: 5.7 mg/dL
Total Volume: 1450

## 2018-05-17 DIAGNOSIS — M1612 Unilateral primary osteoarthritis, left hip: Secondary | ICD-10-CM | POA: Diagnosis not present

## 2018-05-17 DIAGNOSIS — M25551 Pain in right hip: Secondary | ICD-10-CM | POA: Diagnosis not present

## 2018-05-18 ENCOUNTER — Inpatient Hospital Stay (HOSPITAL_BASED_OUTPATIENT_CLINIC_OR_DEPARTMENT_OTHER): Payer: Medicare Other | Admitting: Hematology and Oncology

## 2018-05-18 ENCOUNTER — Encounter: Payer: Self-pay | Admitting: Hematology and Oncology

## 2018-05-18 VITALS — BP 122/80 | HR 74 | Temp 97.7°F | Resp 16 | Wt 173.0 lb

## 2018-05-18 DIAGNOSIS — I714 Abdominal aortic aneurysm, without rupture: Secondary | ICD-10-CM

## 2018-05-18 DIAGNOSIS — M25551 Pain in right hip: Secondary | ICD-10-CM | POA: Diagnosis not present

## 2018-05-18 DIAGNOSIS — C9000 Multiple myeloma not having achieved remission: Secondary | ICD-10-CM

## 2018-05-18 DIAGNOSIS — J449 Chronic obstructive pulmonary disease, unspecified: Secondary | ICD-10-CM | POA: Diagnosis not present

## 2018-05-18 DIAGNOSIS — R0781 Pleurodynia: Secondary | ICD-10-CM | POA: Diagnosis not present

## 2018-05-18 DIAGNOSIS — M1990 Unspecified osteoarthritis, unspecified site: Secondary | ICD-10-CM

## 2018-05-18 DIAGNOSIS — N183 Chronic kidney disease, stage 3 (moderate): Secondary | ICD-10-CM | POA: Diagnosis not present

## 2018-05-18 DIAGNOSIS — D472 Monoclonal gammopathy: Secondary | ICD-10-CM

## 2018-05-18 DIAGNOSIS — Z7189 Other specified counseling: Secondary | ICD-10-CM

## 2018-05-18 DIAGNOSIS — D649 Anemia, unspecified: Secondary | ICD-10-CM

## 2018-05-18 DIAGNOSIS — R634 Abnormal weight loss: Secondary | ICD-10-CM

## 2018-05-18 DIAGNOSIS — D539 Nutritional anemia, unspecified: Secondary | ICD-10-CM

## 2018-05-18 DIAGNOSIS — N289 Disorder of kidney and ureter, unspecified: Secondary | ICD-10-CM

## 2018-05-18 NOTE — Progress Notes (Signed)
Pt here for follow up. Denies any concerns at this time.  

## 2018-05-18 NOTE — Progress Notes (Signed)
[facility] day:  05/18/2018   Chief Complaint: Bailey Ayala is a 78 y.o. female with smoldering myeloma who is seen for review of interval PET scan and discussion regarding direction of therapy.  HPI:  The patient was last seen in the medical oncology clinic on 05/10/2018.  At that time, she had lost 6 pounds.  She notes bilateral rib and right hip pain. Hematocrit was 29.3, hemoglobin 9.3, MCV 105, platelets 246,000, WBC 4800 with an ANC of 2500.  Creatinine was 1.68.  M-spike was 1.2. Lambda free light chains were 214.4 (ratio 0.09).  B12 , folate, TSH, ferritin, iron saturation/TIBC were normal.  PET scan on 05/15/2018 revealed no hypermetabolic lesions characteristic of active myeloma.  There was stable hypermetabolic apical wall of the left ventricle, there was potentially a small pseudoaneurysm in this vicinity, but no appreciable mass was noted on 01/24/2018.  There was an infrarenal abdominal aortic aneurysm, 3.6 cm in diameter. Follow-up ultrasound in 2 years was recommended.  Other imaging findings of potential clinical significance: aortic atherosclerosis and coronary atherosclerosis.  During the interim, she continues to note a left hip ache.  She comments that her cardiologist, Dr Fletcher Anon, knows about her possible pseudoaneurysm in the left ventricle and aortic aneurysm.  She states that she ate well for her birthday dinner.  Weight is up 4 pounds.  She states that he shots given by [facility] worked.  She feel that her back is the issue.  She denies any fevers or infections.   Past Medical History:  Diagnosis Date  . Arthritis   . Blood dyscrasia    BLEEDS EASILY   . COPD (chronic obstructive pulmonary disease) ([place])   . Diabetes mellitus without complication ([place])   . Diffuse cystic mastopathy   . Hyperlipidemia   . Hypertension   . Hypothyroidism   . L4-L5 disc bulge   . Multiple myeloma ([place])   . Osteoporosis,  post-menopausal   . PAD (peripheral artery disease) ([place])   . Renal insufficiency   . Shortness of breath dyspnea    WITH EXERTION   . Stroke ([place])    TIA     2016     WAS FOUND ON SCAN ORDERED FROM [facility]UROL  . Thyroid disease   . TIA (transient ischemic attack) 2016   left hand weakness    Past Surgical History:  Procedure Laterality Date  . ABDOMINAL HYSTERECTOMY  1990  . BACK SURGERY  2011  . BREAST EXCISIONAL BIOPSY Left    2010?   Marland Kitchen CATARACT EXTRACTION, BILATERAL    . COLONOSCOPY  1998  . EYE SURGERY Bilateral 05/06/14  . JOINT REPLACEMENT     LT KNEE  . KNEE SURGERY Left 2012  . OOPHORECTOMY  1990  . parathyroid transplant     2/3 THYROID REMOVED   ([place])  . SPINE SURGERY    . THYROID SURGERY    . TONSILLECTOMY    . TOTAL SHOULDER ARTHROPLASTY Left 01/14/2016   Procedure: LEFT TOTAL SHOULDER ARTHROPLASTY;  Surgeon: Justice Britain, MD;  Location: [facility];  Service: Orthopedics;  Laterality: Left;  . TOTAL SHOULDER ARTHROPLASTY Right 11/03/2016   Procedure: RIGHT TOTAL SHOULDER ARTHROPLASTY;  Surgeon: Justice Britain, MD;  Location: [facility];  Service: Orthopedics;  Laterality: Right;  . ULNAR NERVE REPAIR     LEFT  . vein closure procedure Left 2010    Family History  Problem Relation Age of Onset  . Heart disease  Mother        MI  . Arthritis Mother   . Stroke Mother   . Hyperlipidemia Mother   . Heart disease Father        MI  . Hyperlipidemia Father   . Diabetes Sister   . Hyperlipidemia Brother     Social History:  reports that she quit smoking about 28 years ago. She has a 75.00 pack-year smoking history. She has never used smokeless tobacco. She reports previous alcohol use. She reports that she does not use drugs.  Patient is a former 3 pack per day smoker for 25 years (75 pack year). Patient lives in [place]. She is a former Electrical engineer. Patient denies known exposures to radiation on toxins. The patient is accompanied by 2 women today.  Allergies:  Allergies   Allergen Reactions  . Codeine Other (See Comments)    hallucinations    . Simvastatin Diarrhea and Nausea Only    Current Medications: Current Outpatient Medications  Medication Sig Dispense Refill  . acetaminophen (TYLENOL) 325 MG tablet Take 650 mg by mouth every 6 (six) hours as needed.    Marland Kitchen aspirin EC 81 MG tablet Take 1 tablet (81 mg total) by mouth daily. 90 tablet 3  . Multiple Vitamins-Minerals (CENTRUM SILVER ULTRA WOMENS PO) Take 1 tablet by mouth daily.     Marland Kitchen triamcinolone cream (KENALOG) 0.1 % Apply 1 application topically 2 (two) times daily. 30 g 0  . atorvastatin (LIPITOR) 20 MG tablet Take 1 tablet (20 mg total) by mouth daily. 90 tablet 4  . benazepril (LOTENSIN) 20 MG tablet Take 1 tablet (20 mg total) by mouth daily. 90 tablet 4  . ferrous sulfate 324 (65 Fe) MG TBEC Take 324 mg by mouth daily.     Marland Kitchen levothyroxine (SYNTHROID, LEVOTHROID) 100 MCG tablet Take 1 tablet (100 mcg total) by mouth daily. 90 tablet 4   No current facility-administered medications for this visit.     Review of Systems  Constitutional: Negative for chills, diaphoresis, fever, malaise/fatigue and weight loss (up 4 pounds).  HENT: Negative.  Negative for congestion, ear discharge, ear pain, nosebleeds, sinus pain and sore throat.   Eyes: Negative.  Negative for double vision, photophobia, pain, discharge and redness.  Respiratory: Positive for cough (chronic) and shortness of breath (exertional). Negative for hemoptysis and sputum production.        COPD  Cardiovascular: Negative for chest pain, palpitations, orthopnea, leg swelling and PND.  Gastrointestinal: Positive for diarrhea (loose stools for "years". Refuses colonoscopy). Negative for abdominal pain, blood in stool, constipation, melena, nausea and vomiting.       Ate well for birthday dinner.  Genitourinary: Negative.  Negative for dysuria, frequency, hematuria and urgency.  Musculoskeletal: Positive for back pain (chronic) and joint  pain (right hip aches). Negative for falls and myalgias.  Skin: Negative.  Negative for itching and rash.  Neurological: Negative.  Negative for dizziness, tremors, speech change, focal weakness, weakness and headaches.  Endo/Heme/Allergies: Does not bruise/bleed easily.       HYPOthyroidism - on levothyroxine  Psychiatric/Behavioral: Negative for depression and memory loss. The patient is not nervous/anxious and does not have insomnia.   All other systems reviewed and are negative.  Performance status (ECOG): 1  Vital Signs BP 122/80 (BP Location: Left Arm, Patient Position: Sitting)   Pulse 74   Temp 97.7 F (36.5 C) (Oral)   Resp 16   Wt 173 lb (78.5 kg)   SpO2 95%  BMI 28.35 kg/m   Physical Exam  Constitutional: She is oriented to person, place, and time and well-developed, well-nourished, and in no distress. No distress.  HENT:  Head: Normocephalic and atraumatic.  Long brown hair.  Eyes: Conjunctivae and EOM are normal. No scleral icterus.  Blue eyes  Pulmonary/Chest: Effort normal.  Neurological: She is alert and oriented to person, place, and time.  Skin: She is not diaphoretic.  Psychiatric: Mood, affect and judgment normal.  Nursing note and vitals reviewed.   No visits with results within 3 Day(s) from this visit.  Latest known visit with results is:  Hospital Outpatient Visit on 05/15/2018  Component Date Value Ref Range Status  . Glucose-Capillary 05/15/2018 84  70 - 99 mg/dL Final    Assessment:  KALYSSA ANKER is a 78 y.o. female with smoldering multiple myeloma.  SPEP on 06/21/2017 revealed a 1.2 gm/dL monoclonal spike.  Random urine revealed 4.6 mg/dL protein and a 40.6% M-spike.  Calcium, albumin, and serum protein were normal.  Work-up on 06/30/2017 revealed a hematocrit of 31.1, hemoglobin 10.4, MCV 97.4, platelets 227,000, WBC 5700 with an ANC of 3300.  SPEP revealed a 1.1 gm/dL IgG monoclonal protein with lambda light chain specificity and  monoclonal free lambda light chains (Bence-Jones protein).  IgG was 1660 ([id number]) and IgA was 48 (64-422).  Kappa free light chains were 23.7, lambda free light chains were 142.2 and ratio 0.17 (0.26-1.65).  Beta2-microglobulin was 4.7 (0.6-2.4).  Normal studies included:  B12, folate, and ferritin (75).  TSH was 0.204 (low).  24 hour UPEP revealed kappa free light chains 28.80 mg/L, lambda free light chains 63.80 mg/L and ration 0.45 (2.04 - 10.37).  M spike was 27.4% (16 mg/24 hours).  Bone survey on 06/30/2017 revealed no suspicious focal bony lesions.  Bone marrow aspirate and biopsy on 07/26/2017 revealed a slightly hypercellular marrow for age with trilineage hematopoiesis.  There was 13% plasmacytosis.  IHC stains for CD138 and in situ hybridization for kappa and lambda revealed interstitial cells and small clusters.  Lambda light chains appeared restricted.  Features were felt compatible with plasma cell neoplasm.  PET scan on 08/14/2017 revealed no hypermetabolic lesions of myeloma.  There was a 3.6 cm infrarenal aortic aneurysm.  Ultrasound in 2 years was recommended.  She had a 4 mm LLL nodule too small to characterize.  SPEP has been followed: 1.1 on 06/30/2017, 1.2 on 11/14/2017, 1.3 on 01/26/2018, and 1.2 on 05/10/2018.  IgG has been followed:  1660 on 06/30/2017, 1684 on 11/14/2017, and 1795 on 01/26/2018.  Chest CT on 01/24/2018 revealed persistent LLL nodule.  Area calcified and felt to be consistent with a granuloma.  There were no other suspicious pulmonary nodules.  There was new Schmorl's node formation involving the inferior endplate of Z61.  There were stable small scattered osseous lesions related to underlying multiple myeloma.  PET scan on 05/15/2018 revealed no hypermetabolic lesions characteristic of active myeloma.  There was stable hypermetabolic apical wall of the left ventricle, there was potentially a small pseudoaneurysm in this vicinity, but no appreciable mass was  noted on 01/24/2018.  There was an infrarenal abdominal aortic aneurysm, 3.6 cm in diameter. Follow-up ultrasound in 2 years was recommended.  Other imaging findings of potential clinical significance: aortic atherosclerosis and coronary atherosclerosis.  She has stage III chronic kidney disease (Cr 1.55; CrCl 32.3 ml/min).     She has a macrocytic anemia.  B12, folate, and TSH were normal on 05/10/2018.  Retic was  1.6%.  Ferritin and iron studies were normal on 05/10/2018.  Appetite remains poor.  Last colonoscopy was > 10 years ago (she declines repeat).  She has a 75 pack year smoking history.  She has COPD.  Chest CT without contrast on 07/25/2017 revealed no suspicious pulmonary nodules.  Symptomatically, she has chronic back ache and right hip pain.  Exam is stable.  Plan: 1. Review labs from 05/10/2018. 2. Smoldering multiple myeloma M-spike is stable at 1.2 gm/dL.  PET scan on 05/15/2018 personally reviewed.  Agree with radiology interpretation.  No active myeloma. Continue close monitoring (minumum every 3 months). 3. Back and right hip pain PET scan reveals no active myeloma. Suspect etiology secondary to degenerative changes and osteoarthritis. Continue follow-up with [facility]. Continue close monitoring. 4. Renal insufficieny  M-spike 1.2 gm/dL.  24 hour UPEP with 39 mg Bence Jones protein lambda type in 24 hours.  Etiology not felt secondary to active myeloma  Continue close follow-up with Dr. Holley Raring. 5. Abdominal aortic aneurysm and possible left ventricle pseudoaneurysm PET scan revealed a stable hypermetabolic apical wall of the left ventricle (? small pseudoaneurysm in this vicinity) and a 3.6 cm infrarenal abdominal aortic aneurysm. Patient is followed by Dr. Kathlyn Sacramento and patient feels that he is aware. Copy of note to Dr Fletcher Anon. 6.   Macrocytic anemia  B12, folate, and TSH are normal.  Retic is inappropriately low.  Patient with no known liver  disease.  Etiology may be secondary to a myelodysplastic syndrome.  Last bone marrow on 07/26/2017 noted no dysplasia.  However, MCV at that time was normal.  Continue close monitoring. 7.   RTC in 4 weeks for labs (CBC with diff, ferritin, sed rate). 8.   RTC in [facility] in 3 months for MD assessment and labs (CBC with diff, CMP, SPEP, FLCA).   Lequita Asal, MD 05/18/18, 4:35 PM

## 2018-05-30 ENCOUNTER — Other Ambulatory Visit: Payer: Self-pay | Admitting: Family Medicine

## 2018-05-30 DIAGNOSIS — I10 Essential (primary) hypertension: Secondary | ICD-10-CM

## 2018-05-30 DIAGNOSIS — I779 Disorder of arteries and arterioles, unspecified: Secondary | ICD-10-CM

## 2018-05-30 DIAGNOSIS — E039 Hypothyroidism, unspecified: Secondary | ICD-10-CM

## 2018-05-30 DIAGNOSIS — E782 Mixed hyperlipidemia: Secondary | ICD-10-CM

## 2018-05-30 NOTE — Telephone Encounter (Signed)
Requested medication (s) are due for refill today: yes  Requested medication (s) are on the active medication list:yes  Last refill:  05/29/17 Expired RX  Future visit scheduled: yes  Notes to clinic:  Expired RX    Requested Prescriptions  Pending Prescriptions Disp Refills   benazepril (LOTENSIN) 20 MG tablet [Pharmacy Med Name: BENAZEPRIL  20MG   TAB] 90 tablet 4    Sig: TAKE 1 TABLET BY MOUTH  DAILY     Cardiovascular:  ACE Inhibitors Failed - 05/30/2018  3:05 PM      Failed - Cr in normal range and within 180 days    Creatinine, Ser  Date Value Ref Range Status  05/10/2018 1.68 (H) 0.44 - 1.00 mg/dL Final         Passed - K in normal range and within 180 days    Potassium  Date Value Ref Range Status  05/10/2018 4.2 3.5 - 5.1 mmol/L Final         Passed - Patient is not pregnant      Passed - Last BP in normal range    BP Readings from Last 1 Encounters:  05/18/18 122/80         Passed - Valid encounter within last 6 months    Recent Outpatient Visits          5 months ago Mixed hyperlipidemia   [facility] Crissman, Jeannette How, MD   1 year ago Mixed hyperlipidemia   [facility] Crissman, Jeannette How, MD   1 year ago Screening for diabetes mellitus (DM)   [facility] Crissman, Jeannette How, MD   1 year ago Hyperlipidemia, unspecified hyperlipidemia type   [facility] Crissman, Jeannette How, MD   2 years ago PE (physical exam), annual   [place] Crissman, Jeannette How, MD      Future Appointments            In 4 weeks Crissman, Jeannette How, MD [place], [facility]   In 6 months  [place]          atorvastatin (LIPITOR) 20 MG tablet [Pharmacy Med Name: ATORVASTATIN  20MG   TAB] 90 tablet 4    Sig: TAKE 1 TABLET BY MOUTH  DAILY     Cardiovascular:  Antilipid - Statins Failed - 05/30/2018  3:05 PM      Failed - HDL in normal range and within 360 days    HDL  Date Value Ref Range Status   11/02/2016 44 >39 mg/dL Final         Failed - Triglycerides in normal range and within 360 days    Triglycerides Piccolo,Waived  Date Value Ref Range Status  12/28/2017 230 (H) <150 mg/dL Final    Comment:                            Normal                   <150                         Borderline High     150 - 199                         High                200 -  499                         Very High                >499          Passed - Total Cholesterol in normal range and within 360 days    Cholesterol Piccolo, Waived  Date Value Ref Range Status  12/28/2017 157 <200 mg/dL Final    Comment:                            Desirable                <200                         Borderline High      200- 239                         High                     >239          Passed - LDL in normal range and within 360 days    LDL Calculated  Date Value Ref Range Status  11/02/2016 64 0 - 99 mg/dL Final         Passed - Patient is not pregnant      Passed - Valid encounter within last 12 months    Recent Outpatient Visits          5 months ago Mixed hyperlipidemia   [facility] Crissman, Jeannette How, MD   1 year ago Mixed hyperlipidemia   [facility] Crissman, Jeannette How, MD   1 year ago Screening for diabetes mellitus (DM)   [facility] Crissman, Jeannette How, MD   1 year ago Hyperlipidemia, unspecified hyperlipidemia type   [facility] Crissman, Jeannette How, MD   2 years ago PE (physical exam), annual   [place], MD      Future Appointments            In 4 weeks Crissman, Jeannette How, MD [place], [facility]   In 6 months  [facility], [place]

## 2018-05-30 NOTE — Telephone Encounter (Signed)
Filled until appt  Requested Prescriptions  Pending Prescriptions Disp Refills  . levothyroxine (SYNTHROID, LEVOTHROID) 100 MCG tablet [Pharmacy Med Name: LEVOTHYROXINE  100MCG  TAB] 90 tablet 0    Sig: TAKE 1 TABLET BY MOUTH ONCE A DAY     Endocrinology:  Hypothyroid Agents Failed - 05/30/2018  3:05 PM      Failed - TSH needs to be rechecked within 3 months after an abnormal result. Refill until TSH is due.      Passed - TSH in normal range and within 360 days    TSH  Date Value Ref Range Status  05/10/2018 0.514 0.350 - 4.500 uIU/mL Final    Comment:    Performed by a 3rd Generation assay with a functional sensitivity of <=0.01 uIU/mL. Performed at [facility], [address] [place], [place] [address]   08/01/2017 0.853 0.450 - 4.500 uIU/mL Final         Passed - Valid encounter within last 12 months    Recent Outpatient Visits          5 months ago Mixed hyperlipidemia   [place] Crissman, Jeannette How, MD   1 year ago Mixed hyperlipidemia   [facility] Crissman, Jeannette How, MD   1 year ago Screening for diabetes mellitus (DM)   [facility] Crissman, Jeannette How, MD   1 year ago Hyperlipidemia, unspecified hyperlipidemia type   [facility] Crissman, Jeannette How, MD   2 years ago PE (physical exam), annual   [place], MD      Future Appointments            In 4 weeks Crissman, Jeannette How, MD [place], [facility]   In 6 months  [facility], [place]

## 2018-06-14 ENCOUNTER — Inpatient Hospital Stay: Payer: Medicare Other | Attending: Hematology and Oncology

## 2018-06-14 DIAGNOSIS — C9000 Multiple myeloma not having achieved remission: Secondary | ICD-10-CM | POA: Diagnosis not present

## 2018-06-14 DIAGNOSIS — D472 Monoclonal gammopathy: Secondary | ICD-10-CM

## 2018-06-14 LAB — CBC WITH DIFFERENTIAL/PLATELET
Abs Immature Granulocytes: 0.01 10*3/uL (ref 0.00–0.07)
Basophils Absolute: 0 10*3/uL (ref 0.0–0.1)
Basophils Relative: 0 %
Eosinophils Absolute: 0.2 10*3/uL (ref 0.0–0.5)
Eosinophils Relative: 4 %
HCT: 30 % — ABNORMAL LOW (ref 36.0–46.0)
Hemoglobin: 9.5 g/dL — ABNORMAL LOW (ref 12.0–15.0)
Immature Granulocytes: 0 %
Lymphocytes Relative: 24 %
Lymphs Abs: 1.2 10*3/uL (ref 0.7–4.0)
MCH: 33.6 pg (ref 26.0–34.0)
MCHC: 31.7 g/dL (ref 30.0–36.0)
MCV: 106 fL — ABNORMAL HIGH (ref 80.0–100.0)
Monocytes Absolute: 0.4 10*3/uL (ref 0.1–1.0)
Monocytes Relative: 8 %
Neutro Abs: 3.1 10*3/uL (ref 1.7–7.7)
Neutrophils Relative %: 64 %
Platelets: 201 10*3/uL (ref 150–400)
RBC: 2.83 MIL/uL — ABNORMAL LOW (ref 3.87–5.11)
RDW: 14.2 % (ref 11.5–15.5)
WBC: 4.8 10*3/uL (ref 4.0–10.5)
nRBC: 0 % (ref 0.0–0.2)

## 2018-06-14 LAB — SEDIMENTATION RATE: Sed Rate: 63 mm/hr — ABNORMAL HIGH (ref 0–30)

## 2018-06-14 LAB — FERRITIN: Ferritin: 126 ng/mL (ref 11–307)

## 2018-06-26 ENCOUNTER — Encounter: Payer: Self-pay | Admitting: Hematology and Oncology

## 2018-06-27 ENCOUNTER — Telehealth: Payer: Self-pay | Admitting: Cardiovascular Disease

## 2018-06-27 NOTE — Telephone Encounter (Signed)
Did not need this encounter °

## 2018-06-28 ENCOUNTER — Other Ambulatory Visit: Payer: Self-pay | Admitting: Urgent Care

## 2018-06-28 ENCOUNTER — Ambulatory Visit (INDEPENDENT_AMBULATORY_CARE_PROVIDER_SITE_OTHER): Payer: Medicare Other | Admitting: Family Medicine

## 2018-06-28 ENCOUNTER — Encounter: Payer: Self-pay | Admitting: Family Medicine

## 2018-06-28 VITALS — BP 144/77 | HR 68 | Temp 97.5°F | Ht 65.5 in | Wt 176.5 lb

## 2018-06-28 DIAGNOSIS — C9000 Multiple myeloma not having achieved remission: Secondary | ICD-10-CM

## 2018-06-28 DIAGNOSIS — Z7189 Other specified counseling: Secondary | ICD-10-CM | POA: Diagnosis not present

## 2018-06-28 DIAGNOSIS — I10 Essential (primary) hypertension: Secondary | ICD-10-CM

## 2018-06-28 DIAGNOSIS — Z0000 Encounter for general adult medical examination without abnormal findings: Secondary | ICD-10-CM | POA: Diagnosis not present

## 2018-06-28 DIAGNOSIS — I779 Disorder of arteries and arterioles, unspecified: Secondary | ICD-10-CM | POA: Diagnosis not present

## 2018-06-28 DIAGNOSIS — E782 Mixed hyperlipidemia: Secondary | ICD-10-CM | POA: Diagnosis not present

## 2018-06-28 DIAGNOSIS — L97521 Non-pressure chronic ulcer of other part of left foot limited to breakdown of skin: Secondary | ICD-10-CM | POA: Diagnosis not present

## 2018-06-28 DIAGNOSIS — R58 Hemorrhage, not elsewhere classified: Secondary | ICD-10-CM

## 2018-06-28 DIAGNOSIS — D472 Monoclonal gammopathy: Secondary | ICD-10-CM

## 2018-06-28 DIAGNOSIS — E039 Hypothyroidism, unspecified: Secondary | ICD-10-CM | POA: Diagnosis not present

## 2018-06-28 DIAGNOSIS — Z01812 Encounter for preprocedural laboratory examination: Secondary | ICD-10-CM

## 2018-06-28 LAB — URINALYSIS, ROUTINE W REFLEX MICROSCOPIC
Bilirubin, UA: NEGATIVE
Glucose, UA: NEGATIVE
Ketones, UA: NEGATIVE
Leukocytes, UA: NEGATIVE
Nitrite, UA: NEGATIVE
Protein, UA: NEGATIVE
RBC, UA: NEGATIVE
Specific Gravity, UA: <1.005 — ABNORMAL LOW (ref 1.005–1.030)
Urobilinogen, Ur: 0.2 mg/dL (ref 0.2–1.0)
pH, UA: 5.5 (ref 5.0–7.5)

## 2018-06-28 MED ORDER — ATORVASTATIN CALCIUM 20 MG PO TABS: 20.0000 mg | ORAL_TABLET | Freq: Every day | ORAL | 4 refills | Status: DC

## 2018-06-28 MED ORDER — BENAZEPRIL HCL 20 MG PO TABS: 20.0000 mg | ORAL_TABLET | Freq: Every day | ORAL | 4 refills | Status: DC

## 2018-06-28 MED ORDER — LEVOTHYROXINE SODIUM 100 MCG PO TABS: 100.0000 ug | ORAL_TABLET | Freq: Every day | ORAL | 4 refills | Status: DC

## 2018-06-28 NOTE — Assessment & Plan Note (Signed)
Followed by hematology 

## 2018-06-28 NOTE — Assessment & Plan Note (Signed)
The current medical regimen is effective;  continue present plan and medications.  

## 2018-06-28 NOTE — Progress Notes (Signed)
BP (!) 144/77 (BP Location: Left Arm, Patient Position: Sitting, Cuff Size: Normal)   Pulse 68   Temp (!) 97.5 F (36.4 C)   Ht 5' 5.5" (1.664 m)   Wt 176 lb 8 oz (80.1 kg)   BMI 28.92 kg/m    Subjective:    Patient ID: Bailey Ayala, female    DOB: 20-Feb-1941, 78 y.o.   MRN: [id number]  HPI: Bailey Ayala is a 78 y.o. female  Annual exam Patient all in all doing okay has a lot going on working with the cancer center for multiple myeloma low-grade. Is looking to have hip replacement surgery for osteoarthritis On chart review no elevated glucose readings and patient not aware of any formal diagnosis of diabetes other than maybe something from the hospital at some time will observe for now and leave a lot. Leg PAD with 100% blockage and 75% in right leg.  Relevant past medical, surgical, family and social history reviewed and updated as indicated. Interim medical history since our last visit reviewed. Allergies and medications reviewed and updated.  Review of Systems  Constitutional: Negative.   HENT: Negative.   Eyes: Negative.   Respiratory: Negative.   Cardiovascular: Negative.   Gastrointestinal: Negative.   Endocrine: Negative.   Genitourinary: Negative.   Musculoskeletal: Negative.   Skin: Negative.   Allergic/Immunologic: Negative.   Neurological: Negative.   Hematological: Negative.   Psychiatric/Behavioral: Negative.     Per HPI unless specifically indicated above     Objective:    BP (!) 144/77 (BP Location: Left Arm, Patient Position: Sitting, Cuff Size: Normal)   Pulse 68   Temp (!) 97.5 F (36.4 C)   Ht 5' 5.5" (1.664 m)   Wt 176 lb 8 oz (80.1 kg)   BMI 28.92 kg/m   Wt Readings from Last 3 Encounters:  06/28/18 176 lb 8 oz (80.1 kg)  05/18/18 173 lb (78.5 kg)  05/10/18 169 lb 8.5 oz (76.9 kg)    Physical Exam Constitutional:      Appearance: She is well-developed.  HENT:     Head: Normocephalic and atraumatic.     Right Ear:  External ear normal.     Left Ear: External ear normal.     Nose: Nose normal.  Eyes:     Conjunctiva/sclera: Conjunctivae normal.     Pupils: Pupils are equal, round, and reactive to light.  Neck:     Musculoskeletal: Normal range of motion and neck supple.     Vascular: No carotid bruit.  Cardiovascular:     Rate and Rhythm: Normal rate and regular rhythm.     Heart sounds: Normal heart sounds. No murmur.  Pulmonary:     Effort: Pulmonary effort is normal.     Breath sounds: Normal breath sounds.  Chest:     Chest wall: No mass.     Breasts: Breasts are symmetrical.        Right: No mass, skin change or tenderness.        Left: No mass, skin change or tenderness.  Abdominal:     General: Bowel sounds are normal.     Palpations: Abdomen is soft.  Musculoskeletal: Normal range of motion.     Comments: Absent peripheral pulses  Skin:    Findings: No rash.     Comments: Small ulceration on hammertoe second toe.  Neurological:     Mental Status: She is alert and oriented to person, place, and time.  Psychiatric:  Behavior: Behavior normal.        Thought Content: Thought content normal.        Judgment: Judgment normal.     Results for orders placed or performed in visit on 06/14/18  Ferritin  Result Value Ref Range   Ferritin 126 11 - 307 ng/mL  Sedimentation rate  Result Value Ref Range   Sed Rate 63 (H) 0 - 30 mm/hr  CBC with Differential/Platelet  Result Value Ref Range   WBC 4.8 4.0 - 10.5 K/uL   RBC 2.83 (L) 3.87 - 5.11 MIL/uL   Hemoglobin 9.5 (L) 12.0 - 15.0 g/dL   HCT 30.0 (L) 36.0 - 46.0 %   MCV 106.0 (H) 80.0 - 100.0 fL   MCH 33.6 26.0 - 34.0 pg   MCHC 31.7 30.0 - 36.0 g/dL   RDW 14.2 11.5 - 15.5 %   Platelets 201 150 - 400 K/uL   nRBC 0.0 0.0 - 0.2 %   Neutrophils Relative % 64 %   Neutro Abs 3.1 1.7 - 7.7 K/uL   Lymphocytes Relative 24 %   Lymphs Abs 1.2 0.7 - 4.0 K/uL   Monocytes Relative 8 %   Monocytes Absolute 0.4 0.1 - 1.0 K/uL    Eosinophils Relative 4 %   Eosinophils Absolute 0.2 0.0 - 0.5 K/uL   Basophils Relative 0 %   Basophils Absolute 0.0 0.0 - 0.1 K/uL   Immature Granulocytes 0 %   Abs Immature Granulocytes 0.01 0.00 - 0.07 K/uL      Assessment & Plan:   Problem List Items Addressed This Visit      Cardiovascular and Mediastinum   Peripheral arterial occlusive disease (HCC)   Relevant Medications   benazepril (LOTENSIN) 20 MG tablet   atorvastatin (LIPITOR) 20 MG tablet   Other Relevant Orders   Ambulatory referral to Podiatry   Hypertension   Relevant Medications   benazepril (LOTENSIN) 20 MG tablet   atorvastatin (LIPITOR) 20 MG tablet   Other Relevant Orders   Urinalysis, Routine w reflex microscopic     Endocrine   Hypothyroid    The current medical regimen is effective;  continue present plan and medications.       Relevant Medications   levothyroxine (SYNTHROID, LEVOTHROID) 100 MCG tablet     Musculoskeletal and Integument   Chronic toe ulcer, left, limited to breakdown of skin ([place])    Toe ulcer very concerning with patient's lack of peripheral circulation will refer to podiatry to further evaluate and help adjust patient's shoes so that they do not wear on her toes causing ulcerations.      Relevant Orders   Ambulatory referral to Podiatry     Other   Hyperlipidemia - Primary   Relevant Medications   benazepril (LOTENSIN) 20 MG tablet   atorvastatin (LIPITOR) 20 MG tablet   Advanced care planning/counseling discussion    A voluntary discussion about advanced care planning including explanation and discussion of advanced directives was extentively discussed with the patient.  Explained about the healthcare proxy and living will was reviewed and packet with forms with expiration of how to fill them out was given.  Time spent: Encounter 16+ min individuals present: Patient      Smoldering multiple myeloma ([place])    Followed by hematology       Other Visit Diagnoses     Encounter for Medicare annual wellness exam           Follow up plan: Return in about 1  year (around 06/28/2019) for Physical Exam.

## 2018-06-28 NOTE — Assessment & Plan Note (Signed)
Toe ulcer very concerning with patient's lack of peripheral circulation will refer to podiatry to further evaluate and help adjust patient's shoes so that they do not wear on her toes causing ulcerations.

## 2018-06-28 NOTE — Assessment & Plan Note (Signed)
A voluntary discussion about advanced care planning including explanation and discussion of advanced directives was extentively discussed with the patient.  Explained about the healthcare proxy and living will was reviewed and packet with forms with expiration of how to fill them out was given.  Time spent: Encounter 16+ min individuals present: Patient 

## 2018-06-29 ENCOUNTER — Encounter: Payer: Self-pay | Admitting: Hematology and Oncology

## 2018-07-16 DIAGNOSIS — M1710 Unilateral primary osteoarthritis, unspecified knee: Secondary | ICD-10-CM | POA: Insufficient documentation

## 2018-07-16 DIAGNOSIS — M179 Osteoarthritis of knee, unspecified: Secondary | ICD-10-CM | POA: Insufficient documentation

## 2018-07-17 ENCOUNTER — Ambulatory Visit (INDEPENDENT_AMBULATORY_CARE_PROVIDER_SITE_OTHER): Payer: Medicare Other | Admitting: Podiatry

## 2018-07-17 ENCOUNTER — Other Ambulatory Visit: Payer: Self-pay

## 2018-07-17 ENCOUNTER — Encounter: Payer: Self-pay | Admitting: Podiatry

## 2018-07-17 ENCOUNTER — Ambulatory Visit: Payer: Medicare Other

## 2018-07-17 DIAGNOSIS — B351 Tinea unguium: Secondary | ICD-10-CM | POA: Diagnosis not present

## 2018-07-17 DIAGNOSIS — L97521 Non-pressure chronic ulcer of other part of left foot limited to breakdown of skin: Secondary | ICD-10-CM

## 2018-07-17 DIAGNOSIS — L989 Disorder of the skin and subcutaneous tissue, unspecified: Secondary | ICD-10-CM

## 2018-07-17 DIAGNOSIS — M79676 Pain in unspecified toe(s): Secondary | ICD-10-CM | POA: Diagnosis not present

## 2018-07-18 NOTE — Progress Notes (Signed)
    Subjective: Patient is a 78 y.o. female presenting to the office today as a new patient with a chief complaint of painful callus lesions noted to the bilateral feet that have been present for the past several months. Walking bearing weight increases the pain. She has not had any treatment for the lesions. Patient also complains of elongated, thickened nails that cause pain while ambulating in shoes. She is unable to trim her own nails. Patient presents today for further treatment and evaluation.  Past Medical History:  Diagnosis Date  . Arthritis   . Blood dyscrasia    BLEEDS EASILY   . COPD (chronic obstructive pulmonary disease) ([place])   . Diabetes mellitus without complication ([place])   . Diffuse cystic mastopathy   . Hyperlipidemia   . Hypertension   . Hypothyroidism   . L4-L5 disc bulge   . Multiple myeloma ([place])   . Osteoporosis, post-menopausal   . PAD (peripheral artery disease) ([place])   . Renal insufficiency   . Shortness of breath dyspnea    WITH EXERTION   . Stroke ([place])    TIA     2016     WAS FOUND ON SCAN ORDERED FROM NEUROL  . Thyroid disease   . TIA (transient ischemic attack) 2016   left hand weakness    Objective:  Physical Exam General: Alert and oriented x3 in no acute distress  Dermatology: Hyperkeratotic lesions present on the bilateral feet. Pain on palpation with a central nucleated core noted. Skin is warm, dry and supple bilateral lower extremities. Negative for open lesions or macerations. Nails are tender, long, thickened and dystrophic with subungual debris, consistent with onychomycosis, 1-5 bilateral. No signs of infection noted.  Vascular: Palpable pedal pulses bilaterally. No edema or erythema noted. Capillary refill within normal limits.  Neurological: Epicritic and protective threshold diminished bilaterally.   Musculoskeletal Exam: Pain on palpation at the keratotic lesion noted. Range of motion within normal limits bilateral. Muscle  strength 5/5 in all groups bilateral.  Assessment: 1. Onychodystrophic nails 1-5 bilateral with hyperkeratosis of nails.  2. Onychomycosis of nail due to dermatophyte bilateral 3. Pre-ulcerative callus lesions x 3 noted to the bilateral feet    Plan of Care:  1. Patient evaluated. 2. Excisional debridement of keratoic lesion using a chisel blade was performed without incident.  3. Dressed with light dressing. 4. Mechanical debridement of nails 1-5 bilaterally performed using a nail nipper. Filed with dremel without incident.  5. Patient is to return to the clinic as needed.   Edrick Kins, DPM [facility]  Dr. Edrick Kins, [place]                                        [place], [place] [address]                Office [phone]  Fax [phone]

## 2018-08-07 ENCOUNTER — Encounter: Payer: Self-pay | Admitting: Hematology and Oncology

## 2018-08-07 ENCOUNTER — Other Ambulatory Visit: Payer: Self-pay

## 2018-08-08 ENCOUNTER — Other Ambulatory Visit: Payer: Self-pay

## 2018-08-08 ENCOUNTER — Inpatient Hospital Stay: Payer: Medicare Other | Attending: Hematology and Oncology

## 2018-08-08 DIAGNOSIS — I129 Hypertensive chronic kidney disease with stage 1 through stage 4 chronic kidney disease, or unspecified chronic kidney disease: Secondary | ICD-10-CM | POA: Diagnosis not present

## 2018-08-08 DIAGNOSIS — N183 Chronic kidney disease, stage 3 (moderate): Secondary | ICD-10-CM | POA: Diagnosis not present

## 2018-08-08 DIAGNOSIS — J449 Chronic obstructive pulmonary disease, unspecified: Secondary | ICD-10-CM | POA: Diagnosis not present

## 2018-08-08 DIAGNOSIS — E1151 Type 2 diabetes mellitus with diabetic peripheral angiopathy without gangrene: Secondary | ICD-10-CM | POA: Insufficient documentation

## 2018-08-08 DIAGNOSIS — E785 Hyperlipidemia, unspecified: Secondary | ICD-10-CM | POA: Diagnosis not present

## 2018-08-08 DIAGNOSIS — M549 Dorsalgia, unspecified: Secondary | ICD-10-CM | POA: Insufficient documentation

## 2018-08-08 DIAGNOSIS — Z8673 Personal history of transient ischemic attack (TIA), and cerebral infarction without residual deficits: Secondary | ICD-10-CM | POA: Insufficient documentation

## 2018-08-08 DIAGNOSIS — R600 Localized edema: Secondary | ICD-10-CM | POA: Diagnosis not present

## 2018-08-08 DIAGNOSIS — Z7982 Long term (current) use of aspirin: Secondary | ICD-10-CM | POA: Insufficient documentation

## 2018-08-08 DIAGNOSIS — E039 Hypothyroidism, unspecified: Secondary | ICD-10-CM | POA: Insufficient documentation

## 2018-08-08 DIAGNOSIS — M810 Age-related osteoporosis without current pathological fracture: Secondary | ICD-10-CM | POA: Diagnosis not present

## 2018-08-08 DIAGNOSIS — C9000 Multiple myeloma not having achieved remission: Secondary | ICD-10-CM | POA: Insufficient documentation

## 2018-08-08 DIAGNOSIS — Z87891 Personal history of nicotine dependence: Secondary | ICD-10-CM | POA: Diagnosis not present

## 2018-08-08 DIAGNOSIS — Z79899 Other long term (current) drug therapy: Secondary | ICD-10-CM | POA: Diagnosis not present

## 2018-08-08 DIAGNOSIS — D472 Monoclonal gammopathy: Secondary | ICD-10-CM

## 2018-08-08 DIAGNOSIS — M25551 Pain in right hip: Secondary | ICD-10-CM | POA: Diagnosis not present

## 2018-08-08 DIAGNOSIS — Z01812 Encounter for preprocedural laboratory examination: Secondary | ICD-10-CM

## 2018-08-08 DIAGNOSIS — D539 Nutritional anemia, unspecified: Secondary | ICD-10-CM | POA: Insufficient documentation

## 2018-08-08 DIAGNOSIS — R58 Hemorrhage, not elsewhere classified: Secondary | ICD-10-CM

## 2018-08-08 DIAGNOSIS — E1122 Type 2 diabetes mellitus with diabetic chronic kidney disease: Secondary | ICD-10-CM | POA: Insufficient documentation

## 2018-08-08 LAB — COMPREHENSIVE METABOLIC PANEL
ALT: 14 U/L (ref 0–44)
AST: 19 U/L (ref 15–41)
Albumin: 4.2 g/dL (ref 3.5–5.0)
Alkaline Phosphatase: 76 U/L (ref 38–126)
Anion gap: 8 (ref 5–15)
BUN: 34 mg/dL — ABNORMAL HIGH (ref 8–23)
CO2: 22 mmol/L (ref 22–32)
Calcium: 8.9 mg/dL (ref 8.9–10.3)
Chloride: 103 mmol/L (ref 98–111)
Creatinine, Ser: 1.3 mg/dL — ABNORMAL HIGH (ref 0.44–1.00)
GFR calc Af Amer: 46 mL/min — ABNORMAL LOW (ref >60)
GFR calc non Af Amer: 40 mL/min — ABNORMAL LOW (ref >60)
Glucose, Bld: 98 mg/dL (ref 70–99)
Potassium: 4.6 mmol/L (ref 3.5–5.1)
Sodium: 133 mmol/L — ABNORMAL LOW (ref 135–145)
Total Bilirubin: 0.8 mg/dL (ref 0.3–1.2)
Total Protein: 8.5 g/dL — ABNORMAL HIGH (ref 6.5–8.1)

## 2018-08-08 LAB — PROTIME-INR
INR: 1 (ref 0.8–1.2)
Prothrombin Time: 13.4 seconds (ref 11.4–15.2)

## 2018-08-08 LAB — CBC WITH DIFFERENTIAL/PLATELET
Abs Immature Granulocytes: 0.01 10*3/uL (ref 0.00–0.07)
Basophils Absolute: 0 10*3/uL (ref 0.0–0.1)
Basophils Relative: 0 %
Eosinophils Absolute: 0.2 10*3/uL (ref 0.0–0.5)
Eosinophils Relative: 5 %
HCT: 30.3 % — ABNORMAL LOW (ref 36.0–46.0)
Hemoglobin: 9.8 g/dL — ABNORMAL LOW (ref 12.0–15.0)
Immature Granulocytes: 0 %
Lymphocytes Relative: 27 %
Lymphs Abs: 1.3 10*3/uL (ref 0.7–4.0)
MCH: 34.4 pg — ABNORMAL HIGH (ref 26.0–34.0)
MCHC: 32.3 g/dL (ref 30.0–36.0)
MCV: 106.3 fL — ABNORMAL HIGH (ref 80.0–100.0)
Monocytes Absolute: 0.4 10*3/uL (ref 0.1–1.0)
Monocytes Relative: 8 %
Neutro Abs: 2.8 10*3/uL (ref 1.7–7.7)
Neutrophils Relative %: 60 %
Platelets: 208 10*3/uL (ref 150–400)
RBC: 2.85 MIL/uL — ABNORMAL LOW (ref 3.87–5.11)
RDW: 12.9 % (ref 11.5–15.5)
WBC: 4.8 10*3/uL (ref 4.0–10.5)
nRBC: 0 % (ref 0.0–0.2)

## 2018-08-08 LAB — APTT: aPTT: 29 seconds (ref 24–36)

## 2018-08-09 ENCOUNTER — Encounter: Payer: Self-pay | Admitting: Hematology and Oncology

## 2018-08-09 ENCOUNTER — Inpatient Hospital Stay (HOSPITAL_BASED_OUTPATIENT_CLINIC_OR_DEPARTMENT_OTHER): Payer: Medicare Other | Admitting: Hematology and Oncology

## 2018-08-09 ENCOUNTER — Other Ambulatory Visit: Payer: Medicare Other

## 2018-08-09 ENCOUNTER — Inpatient Hospital Stay: Payer: Medicare Other | Attending: Hematology and Oncology

## 2018-08-09 DIAGNOSIS — D472 Monoclonal gammopathy: Secondary | ICD-10-CM

## 2018-08-09 DIAGNOSIS — C9000 Multiple myeloma not having achieved remission: Secondary | ICD-10-CM

## 2018-08-09 DIAGNOSIS — D539 Nutritional anemia, unspecified: Secondary | ICD-10-CM | POA: Diagnosis not present

## 2018-08-09 LAB — PLATELET FUNCTION ASSAY: Collagen / Epinephrine: 147 seconds (ref 0–193)

## 2018-08-09 LAB — VON WILLEBRAND PANEL
Coagulation Factor VIII: 209 % — ABNORMAL HIGH (ref 56–140)
Ristocetin Co-factor, Plasma: 212 % — ABNORMAL HIGH (ref 50–200)
Von Willebrand Antigen, Plasma: 183 % (ref 50–200)

## 2018-08-09 LAB — MULTIPLE MYELOMA PANEL, SERUM
Albumin SerPl Elph-Mcnc: 3.9 g/dL (ref 2.9–4.4)
Albumin/Glob SerPl: 1.1 (ref 0.7–1.7)
Alpha 1: 0.2 g/dL (ref 0.0–0.4)
Alpha2 Glob SerPl Elph-Mcnc: 1.1 g/dL — ABNORMAL HIGH (ref 0.4–1.0)
B-Globulin SerPl Elph-Mcnc: 0.8 g/dL (ref 0.7–1.3)
Gamma Glob SerPl Elph-Mcnc: 1.6 g/dL (ref 0.4–1.8)
Globulin, Total: 3.7 g/dL (ref 2.2–3.9)
IgA: 34 mg/dL — ABNORMAL LOW (ref 64–422)
IgG (Immunoglobin G), Serum: 2054 mg/dL — ABNORMAL HIGH (ref 586–1602)
IgM (Immunoglobulin M), Srm: 23 mg/dL — ABNORMAL LOW (ref 26–217)
M Protein SerPl Elph-Mcnc: 1.4 g/dL — ABNORMAL HIGH
Total Protein ELP: 7.6 g/dL (ref 6.0–8.5)

## 2018-08-09 LAB — KAPPA/LAMBDA LIGHT CHAINS
Kappa free light chain: 16 mg/L (ref 3.3–19.4)
Kappa, lambda light chain ratio: 0.08 — ABNORMAL LOW (ref 0.26–1.65)
Lambda free light chains: 209.3 mg/L — ABNORMAL HIGH (ref 5.7–26.3)

## 2018-08-09 LAB — COAG STUDIES INTERP REPORT

## 2018-08-09 NOTE — Progress Notes (Signed)
[facility]  [address], [address] [place], [place] [address] Phone: [phone]  Fax: [phone]   Telephone Office Visit:  08/09/2018  Referring physician: Guadalupe Maple, MD  I connected with [name] on 08/09/18 at 3:06 PM EDT by telephone and verified that I was speaking with the correct person using 2 identifiers.  The patient was at home.  I discussed the limitations, risk, security and privacy concerns of performing an evaluation and management service by telephone and the availability of in person appointments.  I also discussed with the patient that there may be a patient responsible charge related to this service.  The patient expressed understanding and agreed to proceed.    Chief Complaint: Bailey Ayala is a 78 y.o. female with smoldering myeloma who is seen for 3 month assessment.  Symptomatically, she had chronic back ache and right hip pain.  Exam was stable.  HPI:  The patient was last seen in the medical oncology clinic on 05/18/2018.  At that time, M-spike was stable at 1.2 gm/dL.  PET scan on 05/15/2018 revealed no active myeloma.  During the interim, she has done well.  She has felt better since her starting oral iron (ferrous sulfate tablet a day).  He continues to have issues with her right hip.  She is planning on having right hip replacement as soon as possible after the COVID-19 pandemic.  She sees Dr. Paralee Cancel, orthopedic surgeon in [place].  She also sees Dr. Laural Golden who provides her with hip injections.  She notes some symptoms of claudication as well as little swelling in her feet.   Past Medical History:  Diagnosis Date  . Arthritis   . Blood dyscrasia    BLEEDS EASILY   . COPD (chronic obstructive pulmonary disease) ([place])   . Diabetes mellitus without complication ([place])   . Diffuse cystic mastopathy   . Hyperlipidemia   . Hypertension   . Hypothyroidism   . L4-L5 disc bulge   . Multiple myeloma ([place])    . Osteoporosis, post-menopausal   . PAD (peripheral artery disease) ([place])   . Renal insufficiency   . Shortness of breath dyspnea    WITH EXERTION   . Stroke ([place])    TIA     2016     WAS FOUND ON SCAN ORDERED FROM NEUROL  . Thyroid disease   . TIA (transient ischemic attack) 2016   left hand weakness    Past Surgical History:  Procedure Laterality Date  . ABDOMINAL HYSTERECTOMY  1990  . BACK SURGERY  2011  . BREAST EXCISIONAL BIOPSY Left    2010?   Marland Kitchen CATARACT EXTRACTION, BILATERAL    . COLONOSCOPY  1998  . EYE SURGERY Bilateral 05/06/14  . JOINT REPLACEMENT     LT KNEE  . KNEE SURGERY Left 2012  . OOPHORECTOMY  1990  . parathyroid transplant     2/3 THYROID REMOVED   ([place])  . SPINE SURGERY    . THYROID SURGERY    . TONSILLECTOMY    . TOTAL SHOULDER ARTHROPLASTY Left 01/14/2016   Procedure: LEFT TOTAL SHOULDER ARTHROPLASTY;  Surgeon: Justice Britain, MD;  Location: [facility];  Service: Orthopedics;  Laterality: Left;  . TOTAL SHOULDER ARTHROPLASTY Right 11/03/2016   Procedure: RIGHT TOTAL SHOULDER ARTHROPLASTY;  Surgeon: Justice Britain, MD;  Location: [facility];  Service: Orthopedics;  Laterality: Right;  . ULNAR NERVE REPAIR     LEFT  . vein closure procedure Left 2010  Family History  Problem Relation Age of Onset  . Heart disease Mother        MI  . Arthritis Mother   . Stroke Mother   . Hyperlipidemia Mother   . Heart disease Father        MI  . Hyperlipidemia Father   . Diabetes Sister   . Hyperlipidemia Brother     Social History:  reports that she quit smoking about 28 years ago. She has a 75.00 pack-year smoking history. She has never used smokeless tobacco. She reports previous alcohol use. She reports that she does not use drugs.  Patient is a former 3 pack per day smoker for 25 years (75 pack year). Patient lives in [place]. She is a former Electrical engineer. Patient denies known exposures to radiation on toxins. The patient is accompanied by 2 women today.   Participants in the patient's visit and their role in the encounter included the patient and Vito Berger, CMA, today.  The intake visit was provided by Vito Berger, CMA.    Allergies:  Allergies  Allergen Reactions  . Codeine Other (See Comments)    hallucinations    . Simvastatin Diarrhea and Nausea Only    Current Medications: Current Outpatient Medications  Medication Sig Dispense Refill  . aspirin EC 81 MG tablet Take 1 tablet (81 mg total) by mouth daily. 90 tablet 3  . atorvastatin (LIPITOR) 20 MG tablet Take 1 tablet (20 mg total) by mouth daily. 90 tablet 4  . benazepril (LOTENSIN) 20 MG tablet Take 1 tablet (20 mg total) by mouth daily. 90 tablet 4  . ferrous sulfate 324 (65 Fe) MG TBEC Take 324 mg by mouth daily.     Marland Kitchen levothyroxine (SYNTHROID, LEVOTHROID) 100 MCG tablet Take 1 tablet (100 mcg total) by mouth daily. 90 tablet 4  . Multiple Vitamins-Minerals (CENTRUM SILVER ULTRA WOMENS PO) Take 1 tablet by mouth daily.     Marland Kitchen triamcinolone cream (KENALOG) 0.1 % Apply 1 application topically 2 (two) times daily. 30 g 0  . acetaminophen (TYLENOL) 325 MG tablet Take 650 mg by mouth every 6 (six) hours as needed.     No current facility-administered medications for this visit.     Review of Systems  Constitutional: Negative for chills, diaphoresis, fever, malaise/fatigue and weight loss.       Feels tired and cold (always).  HENT: Negative.  Negative for congestion, ear pain, nosebleeds, sinus pain and sore throat.   Eyes: Negative.  Negative for blurred vision, double vision and photophobia.  Respiratory: Positive for cough (chronic) and shortness of breath (exertional). Negative for hemoptysis and sputum production.        COPD.  Cardiovascular: Positive for claudication. Negative for chest pain, palpitations, orthopnea, leg swelling and PND.  Gastrointestinal: Positive for diarrhea (loose stools for "years"). Negative for abdominal pain, blood in stool,  constipation, melena, nausea and vomiting.  Genitourinary: Negative.  Negative for dysuria, frequency, hematuria and urgency.  Musculoskeletal: Positive for back pain (chronic) and joint pain (right hip). Negative for falls, myalgias and neck pain.  Skin: Negative.  Negative for itching and rash.  Neurological: Negative.  Negative for dizziness, tremors, sensory change, speech change, focal weakness, weakness and headaches.  Endo/Heme/Allergies: Does not bruise/bleed easily.       HYPOthyroidism - on levothyroxine.  Psychiatric/Behavioral: Negative for depression and memory loss. The patient is not nervous/anxious and does not have insomnia.   All other systems reviewed and are negative.  Performance status (  ECOG): 1   Appointment on 08/09/2018  Component Date Value Ref Range Status  . PFA Interpretation 08/09/2018          Final   Comment: Platelet function is normal. If patient history/physical examination give strong indication of a bleeding disorder repeat testing for confirmation.        Results of the test should always be interpreted in conjunction with the patient's medical history, clinical presentation and medication history. Patients with Hematocrit values <35.0% or Platelet counts <150,000/uL may result in values above the Laboratory established reference range.   . Collagen / Epinephrine 08/09/2018 147  0 - 193 seconds Final   Performed at [facility], [address], [place], [place] [address]  Clinical Support on 08/08/2018  Component Date Value Ref Range Status  . aPTT 08/08/2018 29  24 - 36 seconds Final   Performed at [facility], [address] [place], [place] [address]  . Prothrombin Time 08/08/2018 13.4  11.4 - 15.2 seconds Final  . INR 08/08/2018 1.0  0.8 - 1.2 Final   Comment: (NOTE) INR goal varies based on device and disease states. Performed at [facility], [address] [place], [place] [address]   . Sodium 08/08/2018  133* 135 - 145 mmol/L Final  . Potassium 08/08/2018 4.6  3.5 - 5.1 mmol/L Final  . Chloride 08/08/2018 103  98 - 111 mmol/L Final  . CO2 08/08/2018 22  22 - 32 mmol/L Final  . Glucose, Bld 08/08/2018 98  70 - 99 mg/dL Final  . BUN 08/08/2018 34* 8 - 23 mg/dL Final  . Creatinine, Ser 08/08/2018 1.30* 0.44 - 1.00 mg/dL Final  . Calcium 08/08/2018 8.9  8.9 - 10.3 mg/dL Final  . Total Protein 08/08/2018 8.5* 6.5 - 8.1 g/dL Final  . Albumin 08/08/2018 4.2  3.5 - 5.0 g/dL Final  . AST 08/08/2018 19  15 - 41 U/L Final  . ALT 08/08/2018 14  0 - 44 U/L Final  . Alkaline Phosphatase 08/08/2018 76  38 - 126 U/L Final  . Total Bilirubin 08/08/2018 0.8  0.3 - 1.2 mg/dL Final  . GFR calc non Af Amer 08/08/2018 40* >60 mL/min Final  . GFR calc Af Amer 08/08/2018 46* >60 mL/min Final  . Anion gap 08/08/2018 8  5 - 15 Final   Performed at [facility], [address] [place], [place] [address]  . WBC 08/08/2018 4.8  4.0 - 10.5 K/uL Final  . RBC 08/08/2018 2.85* 3.87 - 5.11 MIL/uL Final  . Hemoglobin 08/08/2018 9.8* 12.0 - 15.0 g/dL Final  . HCT 08/08/2018 30.3* 36.0 - 46.0 % Final  . MCV 08/08/2018 106.3* 80.0 - 100.0 fL Final  . MCH 08/08/2018 34.4* 26.0 - 34.0 pg Final  . MCHC 08/08/2018 32.3  30.0 - 36.0 g/dL Final  . RDW 08/08/2018 12.9  11.5 - 15.5 % Final  . Platelets 08/08/2018 208  150 - 400 K/uL Final  . nRBC 08/08/2018 0.0  0.0 - 0.2 % Final  . Neutrophils Relative % 08/08/2018 60  % Final  . Neutro Abs 08/08/2018 2.8  1.7 - 7.7 K/uL Final  . Lymphocytes Relative 08/08/2018 27  % Final  . Lymphs Abs 08/08/2018 1.3  0.7 - 4.0 K/uL Final  . Monocytes Relative 08/08/2018 8  % Final  . Monocytes Absolute 08/08/2018 0.4  0.1 - 1.0 K/uL Final  . Eosinophils Relative 08/08/2018 5  % Final  . Eosinophils Absolute 08/08/2018 0.2  0.0 -  0.5 K/uL Final  . Basophils Relative 08/08/2018 0  % Final  . Basophils Absolute 08/08/2018 0.0  0.0 - 0.1 K/uL Final  . Immature Granulocytes  08/08/2018 0  % Final  . Abs Immature Granulocytes 08/08/2018 0.01  0.00 - 0.07 K/uL Final   Performed at [facility], [address], [place], [place] [address]    Assessment:  LEEZA HEINER is a 78 y.o. female with smoldering multiple myeloma.  SPEP on 06/21/2017 revealed a 1.2 gm/dL monoclonal spike.  Random urine revealed 4.6 mg/dL protein and a 40.6% M-spike.  Calcium, albumin, and serum protein were normal.  Work-up on 06/30/2017 revealed a hematocrit of 31.1, hemoglobin 10.4, MCV 97.4, platelets 227,000, WBC 5700 with an ANC of 3300.  SPEP revealed a 1.1 gm/dL IgG monoclonal protein with lambda light chain specificity and monoclonal free lambda light chains (Bence-Jones protein).  IgG was 1660 [phone]) and IgA was 48 (64-422).  Kappa free light chains were 23.7, lambda free light chains were 142.2 and ratio 0.17 (0.26-1.65).  Beta2-microglobulin was 4.7 (0.6-2.4).  Normal studies included:  B12, folate, and ferritin (75).  TSH was 0.204 (low).  24 hour UPEP revealed kappa free light chains 28.80 mg/L, lambda free light chains 63.80 mg/L and ration 0.45 (2.04 - 10.37).  M spike was 27.4% (16 mg/24 hours).  Bone survey on 06/30/2017 revealed no suspicious focal bony lesions.  Bone marrow aspirate and biopsy on 07/26/2017 revealed a slightly hypercellular marrow for age with trilineage hematopoiesis.  There was 13% plasmacytosis.  IHC stains for CD138 and in situ hybridization for kappa and lambda revealed interstitial cells and small clusters.  Lambda light chains appeared restricted.  Features were felt compatible with plasma cell neoplasm.  PET scan on 08/14/2017 revealed no hypermetabolic lesions of myeloma.  There was a 3.6 cm infrarenal aortic aneurysm.  Ultrasound in 2 years was recommended.  She had a 4 mm LLL nodule too small to characterize.  SPEP has been followed: 1.1 on 06/30/2017, 1.2 on 11/14/2017, 1.3 on 01/26/2018, and 1.2 on 05/10/2018.  IgG has been followed:   1660 on 06/30/2017, 1684 on 11/14/2017, and 1795 on 01/26/2018.  Chest CT on 01/24/2018 revealed persistent LLL nodule.  Area calcified and felt to be consistent with a granuloma.  There were no other suspicious pulmonary nodules.  There was new Schmorl's node formation involving the inferior endplate of L84.  There were stable small scattered osseous lesions related to underlying multiple myeloma.  PET scan on 05/15/2018 revealed no hypermetabolic lesions characteristic of active myeloma.  There was stable hypermetabolic apical wall of the left ventricle, there was potentially a small pseudoaneurysm in this vicinity, but no appreciable mass was noted on 01/24/2018.  There was an infrarenal abdominal aortic aneurysm, 3.6 cm in diameter. Follow-up ultrasound in 2 years was recommended.  Other imaging findings of potential clinical significance: aortic atherosclerosis and coronary atherosclerosis.  She has stage III chronic kidney disease (Cr 1.55; CrCl 32.3 ml/min).     She has a macrocytic anemia.  B12, folate, and TSH were normal on 05/10/2018.  Retic was 1.6%.  Ferritin and iron studies were normal on 05/10/2018.  Appetite remains poor.  Last colonoscopy was > 10 years ago (she declines repeat).  She has a 75 pack year smoking history.  She has COPD.  Chest CT without contrast on 07/25/2017 revealed no suspicious pulmonary nodules.  Symptomatically, she continues to have issues with her right hip.  She is planning on right  hip replacement after the COD-19 pandemic.  Hemoglobin is 9.8.  Creatinine is 1.3 (improved).  Plan: 1. Review labs from 08/08/2018. 2. Smoldering multiple myeloma Clinically doing well. M-spike is pending. Continue every 3 month surveillance. 3. Back and right hip pain PET scan confirmed no active myeloma. Patient planning hip replacement. Follow-up with Dr Paralee Cancel, orthopedic surgeon. 4. Renal insufficieny  Creatinine 1.30 (improved).  Continue close follow-up  with Dr. Holley Raring. 5.   Macrocytic anemia  B12, folate, and TSH are normal.   Patient with no known liver disease.  Etiology may be secondary to a myelodysplastic syndrome.  Last bone marrow on 07/26/2017 noted no dysplasia.  However, MCV at that time was normal.  Continue surveillance. 6.   RTC in 3 months for MD assessment and labs (CBC with diff, CMP, SPEP and FLCA).   I discussed the assessment and treatment plan with the patient.  The patient was provided an opportunity to ask questions and all were answered.  The patient agreed with the plan and demonstrated an understanding of the instructions.  The patient was advised to call back or seek an in person evaluation if the symptoms worsen or if the condition fails to improve as anticipated.  I provided 14 minutes (3:06 PM - 3:20 PM) of non-face-to-face time during this encounter.  I provided these services from the [facility] office.   Lequita Asal, MD, PhD  08/09/2018, 3:06 PM

## 2018-08-21 ENCOUNTER — Encounter: Payer: Self-pay | Admitting: Cardiovascular Disease

## 2018-08-21 ENCOUNTER — Telehealth (INDEPENDENT_AMBULATORY_CARE_PROVIDER_SITE_OTHER): Payer: Medicare Other | Admitting: Cardiovascular Disease

## 2018-08-21 ENCOUNTER — Other Ambulatory Visit: Payer: Self-pay

## 2018-08-21 VITALS — BP 145/77 | HR 73 | Temp 97.4°F | Ht 65.5 in | Wt 170.0 lb

## 2018-08-21 DIAGNOSIS — I739 Peripheral vascular disease, unspecified: Secondary | ICD-10-CM

## 2018-08-21 NOTE — Patient Instructions (Signed)
Medication Instructions:  No change If you need a refill on your cardiac medications before your next appointment, please call your pharmacy.   Lab work: None If you have labs (blood work) drawn today and your tests are completely normal, you will receive your results only by: Marland Kitchen MyChart Message (if you have MyChart) OR . A paper copy in the mail If you have any lab test that is abnormal or we need to change your treatment, we will call you to review the results.  Testing/Procedures: None  Follow-Up: At [facility], you and your health needs are our priority.  As part of our continuing mission to provide you with exceptional heart care, we have created designated Provider Care Teams.  These Care Teams include your primary Cardiologist (physician) and Advanced Practice Providers (APPs -  Physician Assistants and Nurse Practitioners) who all work together to provide you with the care you need, when you need it. You will need a follow up appointment in 4 months.  Please call our office 2 months in advance to schedule this appointment.  You may see Kathlyn Sacramento, MD or one of the following Advanced Practice Providers on your designated Care Team:   Murray Hodgkins, NP Christell Faith, PA-C . Marrianne Mood, PA-C

## 2018-08-21 NOTE — Progress Notes (Signed)
Virtual Visit via Video Note   This visit type was conducted due to national recommendations for restrictions regarding the COVID-19 Pandemic (e.g. social distancing) in an effort to limit this patient's exposure and mitigate transmission in our community.  Due to her co-morbid illnesses, this patient is at least at moderate risk for complications without adequate follow up.  This format is felt to be most appropriate for this patient at this time.  All issues noted in this document were discussed and addressed.  A limited physical exam was performed with this format.  Please refer to the patient's chart for her consent to telehealth for [facility].   Evaluation Performed:  Follow-up visit  Date:  08/21/2018   ID:  Bailey Ayala, DOB 1940/08/21, MRN [id number]  Patient Location: Home Provider Location: Office  PCP:  Guadalupe Maple, MD  Cardiologist:  Kathlyn Sacramento, MD  Electrophysiologist:  None   Chief Complaint: Follow-up visit  History of Present Illness:    Bailey Ayala is a 78 y.o. female who was reached via phone for a follow-up visit regarding peripheral arterial disease. She has chronic medical conditions that include peripheral arterial disease with intermittent claudication, previous tobacco use, hyperlipidemia, COPD and hypertension.  She had a nuclear stress test done in July 2017 for exertional dyspnea which showed no evidence of ischemia with normal ejection fraction. The patient has known history of bilateral calf claudication due to SFA disease. ABI in 2017 was 0.57 on the right and 0.49 on the left. Duplex showed occlusion of the proximal to mid left SFA and borderline significant right SFA disease.  The patient developed a rash with cilostazol which was discontinued.  She has smoldering multiple myeloma which is being monitored by oncology.  She has not required treatment yet. Repeat vascular studies in 2019 showed improvement in ABIs to 0.93 on the  right and 0.64 on the left.  She has been doing reasonably well overall with stable left calf claudication.  She does complain of knee and hip pain and needs to have surgery done which is currently on hold.  No chest pain.  She does complain of exertional dyspnea.  Echocardiogram last year showed normal LV systolic function  The patient does not have symptoms concerning for COVID-19 infection (fever, chills, cough, or new shortness of breath).    Past Medical History:  Diagnosis Date  . Arthritis   . Blood dyscrasia    BLEEDS EASILY   . COPD (chronic obstructive pulmonary disease) ([place])   . Diabetes mellitus without complication ([place])   . Diffuse cystic mastopathy   . Hyperlipidemia   . Hypertension   . Hypothyroidism   . L4-L5 disc bulge   . Multiple myeloma ([place])   . Osteoporosis, post-menopausal   . PAD (peripheral artery disease) ([place])   . Renal insufficiency   . Shortness of breath dyspnea    WITH EXERTION   . Stroke ([place])    TIA     2016     WAS FOUND ON SCAN ORDERED FROM NEUROL  . Thyroid disease   . TIA (transient ischemic attack) 2016   left hand weakness   Past Surgical History:  Procedure Laterality Date  . ABDOMINAL HYSTERECTOMY  1990  . BACK SURGERY  2011  . BREAST EXCISIONAL BIOPSY Left    2010?   Marland Kitchen CATARACT EXTRACTION, BILATERAL    . COLONOSCOPY  1998  . EYE SURGERY Bilateral 05/06/14  . JOINT REPLACEMENT     LT  KNEE  . KNEE SURGERY Left 2012  . OOPHORECTOMY  1990  . parathyroid transplant     2/3 THYROID REMOVED   (GOITER)  . SPINE SURGERY    . THYROID SURGERY    . TONSILLECTOMY    . TOTAL SHOULDER ARTHROPLASTY Left 01/14/2016   Procedure: LEFT TOTAL SHOULDER ARTHROPLASTY;  Surgeon: Justice Britain, MD;  Location: [place];  Service: Orthopedics;  Laterality: Left;  . TOTAL SHOULDER ARTHROPLASTY Right 11/03/2016   Procedure: RIGHT TOTAL SHOULDER ARTHROPLASTY;  Surgeon: Justice Britain, MD;  Location: [place];  Service: Orthopedics;  Laterality: Right;  . ULNAR  NERVE REPAIR     LEFT  . vein closure procedure Left 2010     Current Meds  Medication Sig  . acetaminophen (TYLENOL) 325 MG tablet Take 650 mg by mouth every 6 (six) hours as needed.  Marland Kitchen aspirin EC 81 MG tablet Take 1 tablet (81 mg total) by mouth daily.  Marland Kitchen atorvastatin (LIPITOR) 20 MG tablet Take 1 tablet (20 mg total) by mouth daily.  . benazepril (LOTENSIN) 20 MG tablet Take 1 tablet (20 mg total) by mouth daily.  . ferrous sulfate 324 (65 Fe) MG TBEC Take 324 mg by mouth daily.   Marland Kitchen levothyroxine (SYNTHROID, LEVOTHROID) 100 MCG tablet Take 1 tablet (100 mcg total) by mouth daily.  . Multiple Vitamins-Minerals (CENTRUM SILVER ULTRA WOMENS PO) Take 1 tablet by mouth daily.   Marland Kitchen triamcinolone cream (KENALOG) 0.1 % Apply 1 application topically 2 (two) times daily.     Allergies:   Codeine and Simvastatin   Social History   Tobacco Use  . Smoking status: Former Smoker    Packs/day: 3.00    Years: 25.00    Pack years: 75.00    Last attempt to quit: 11/03/1989    Years since quitting: 28.8  . Smokeless tobacco: Never Used  Substance Use Topics  . Alcohol use: Not Currently    Alcohol/week: 0.0 standard drinks  . Drug use: No     Family Hx: The patient's family history includes Arthritis in her mother; Diabetes in her sister; Heart disease in her father and mother; Hyperlipidemia in her brother, father, and mother; Stroke in her mother.  ROS:   Please see the history of present illness.     All other systems reviewed and are negative.   Prior CV studies:   The following studies were reviewed today:  I reviewed echocardiogram done in October 2019  Labs/Other Tests and Data Reviewed:    EKG:  No ECG reviewed.  Recent Labs: 05/10/2018: TSH 0.514 08/08/2018: ALT 14; BUN 34; Creatinine, Ser 1.30; Hemoglobin 9.8; Platelets 208; Potassium 4.6; Sodium 133   Recent Lipid Panel Lab Results  Component Value Date/Time   CHOL 157 12/28/2017 02:51 PM   TRIG 230 (H) 12/28/2017  02:51 PM   HDL 44 11/02/2016 02:49 PM   CHOLHDL 3.6 11/02/2016 02:49 PM   LDLCALC 64 11/02/2016 02:49 PM    Wt Readings from Last 3 Encounters:  08/21/18 170 lb (77.1 kg)  06/28/18 176 lb 8 oz (80.1 kg)  05/18/18 173 lb (78.5 kg)     Objective:    Vital Signs:  BP (!) 145/77   Pulse 73   Temp (!) 97.4 F (36.3 C)   Ht 5' 5.5" (1.664 m)   Wt 170 lb (77.1 kg)   BMI 27.86 kg/m    VITAL SIGNS:  reviewed  ASSESSMENT & PLAN:    1. Peripheral arterial disease with moderate bilateral  calf claudication worse on the left side:     Stable symptoms overall.  Continue medical therapy. A lot of her leg pain seems to be related to hip and knee arthritis.  2. Hyperlipidemia: Continue treatment with atorvastatin. Most recent lipid profile in  July showed an LDL of 64. Triglyceride was mildly elevated at 262.  3. Essential hypertension: Blood pressure is mildly elevated.  Continue to monitor.  4.   Preop cardiovascular evaluation for possible hip and knee replacement: She might need to get this done by the end of the year.  I will bring her to see me in 4 months with an EKG and determine if stress testing is needed.  5.  Small abdominal aortic aneurysm: This requires follow-up ultrasound in October 2020.   COVID-19 Education: The signs and symptoms of COVID-19 were discussed with the patient and how to seek care for testing (follow up with PCP or arrange E-visit).  The importance of social distancing was discussed today.  Time:   Today, I have spent 20 minutes with the patient with telehealth technology discussing the above problems.     Medication Adjustments/Labs and Tests Ordered: Current medicines are reviewed at length with the patient today.  Concerns regarding medicines are outlined above.   Tests Ordered: No orders of the defined types were placed in this encounter.   Medication Changes: No orders of the defined types were placed in this encounter.   Disposition:   Follow up in 4 month(s)  Signed, Kathlyn Sacramento, MD  08/21/2018 12:53 PM    [place]

## 2018-08-23 DIAGNOSIS — M25551 Pain in right hip: Secondary | ICD-10-CM | POA: Diagnosis not present

## 2018-09-19 DIAGNOSIS — D631 Anemia in chronic kidney disease: Secondary | ICD-10-CM | POA: Diagnosis not present

## 2018-09-19 DIAGNOSIS — N183 Chronic kidney disease, stage 3 (moderate): Secondary | ICD-10-CM | POA: Diagnosis not present

## 2018-09-19 DIAGNOSIS — C9000 Multiple myeloma not having achieved remission: Secondary | ICD-10-CM | POA: Diagnosis not present

## 2018-09-19 DIAGNOSIS — I10 Essential (primary) hypertension: Secondary | ICD-10-CM | POA: Diagnosis not present

## 2018-10-01 DIAGNOSIS — Z1231 Encounter for screening mammogram for malignant neoplasm of breast: Secondary | ICD-10-CM | POA: Diagnosis not present

## 2018-10-18 ENCOUNTER — Ambulatory Visit (INDEPENDENT_AMBULATORY_CARE_PROVIDER_SITE_OTHER): Payer: Medicare Other | Admitting: Podiatry

## 2018-10-18 ENCOUNTER — Encounter: Payer: Self-pay | Admitting: Podiatry

## 2018-10-18 ENCOUNTER — Other Ambulatory Visit: Payer: Self-pay

## 2018-10-18 DIAGNOSIS — M79674 Pain in right toe(s): Secondary | ICD-10-CM | POA: Diagnosis not present

## 2018-10-18 DIAGNOSIS — M79675 Pain in left toe(s): Secondary | ICD-10-CM | POA: Diagnosis not present

## 2018-10-18 DIAGNOSIS — L989 Disorder of the skin and subcutaneous tissue, unspecified: Secondary | ICD-10-CM

## 2018-10-18 DIAGNOSIS — B351 Tinea unguium: Secondary | ICD-10-CM | POA: Insufficient documentation

## 2018-10-18 NOTE — Progress Notes (Signed)
Complaint:  Visit Type: Patient returns to my office for continued preventative foot care services. Complaint: Patient states" my nails have grown long and thick and become painful to walk and wear shoes" Patient has been diagnosed with DM with neuropathy. The patient presents for preventative foot care services. No changes to ROS  Podiatric Exam: Vascular: dorsalis pedis and posterior tibial pulses are palpable bilateral. Capillary return is immediate. Temperature gradient is WNL. Skin turgor WNL  Sensorium: Diminished  Semmes Weinstein monofilament test. Normal tactile sensation bilaterally. Nail Exam: Pt has thick disfigured discolored nails with subungual debris noted bilateral entire nail hallux through fifth toenails Ulcer Exam: There is no evidence of ulcer or pre-ulcerative changes or infection. Orthopedic Exam: Muscle tone and strength are WNL. No limitations in general ROM. No crepitus or effusions noted. Foot type and digits show no abnormalities.HAV with overlapping 2 nd  B/L. Skin: No Porokeratosis. No infection or ulcers.  Pinch callus  B/L  Diagnosis:  Onychomycosis, , Pain in right toe, pain in left toes  Callus  B/L  Treatment & Plan Procedures and Treatment: Consent by patient was obtained for treatment procedures.   Debridement of mycotic and hypertrophic toenails, 1 through 5 bilateral and clearing of subungual debris. No ulceration, no infection noted. Debride pinch callus  B/l. Return Visit-Office Procedure: Patient instructed to return to the office for a follow up visit 3 months for continued evaluation and treatment.    Gardiner Barefoot DPM

## 2018-10-31 ENCOUNTER — Telehealth: Payer: Self-pay | Admitting: Cardiovascular Disease

## 2018-10-31 NOTE — Telephone Encounter (Signed)

## 2018-11-01 ENCOUNTER — Ambulatory Visit (INDEPENDENT_AMBULATORY_CARE_PROVIDER_SITE_OTHER): Payer: Medicare Other | Admitting: Cardiovascular Disease

## 2018-11-01 ENCOUNTER — Other Ambulatory Visit: Payer: Self-pay

## 2018-11-01 ENCOUNTER — Encounter: Payer: Self-pay | Admitting: Cardiovascular Disease

## 2018-11-01 VITALS — BP 120/58 | HR 69 | Temp 98.2°F | Ht 65.0 in | Wt 175.0 lb

## 2018-11-01 DIAGNOSIS — I779 Disorder of arteries and arterioles, unspecified: Secondary | ICD-10-CM

## 2018-11-01 DIAGNOSIS — Z01818 Encounter for other preprocedural examination: Secondary | ICD-10-CM

## 2018-11-01 DIAGNOSIS — E782 Mixed hyperlipidemia: Secondary | ICD-10-CM

## 2018-11-01 DIAGNOSIS — I739 Peripheral vascular disease, unspecified: Secondary | ICD-10-CM | POA: Diagnosis not present

## 2018-11-01 DIAGNOSIS — I10 Essential (primary) hypertension: Secondary | ICD-10-CM

## 2018-11-01 DIAGNOSIS — R0602 Shortness of breath: Secondary | ICD-10-CM | POA: Diagnosis not present

## 2018-11-01 NOTE — H&P (View-Only) (Signed)
Cardiology Office Note   Date:  11/01/2018   ID:  Bailey Ayala, DOB 1940-04-30, MRN [id number]  PCP:  Guadalupe Maple, MD  Cardiologist:   Kathlyn Sacramento, MD   Chief Complaint  Patient presents with  . other    Cardiac clearance for right hip surgery; Dr. Roxy Manns with [facility]. Meds reviewed by the pt. verbally.       History of Present Illness: Bailey Ayala is a 78 y.o. female who presents for a follow-up visit regarding peripheral arterial disease as well as preoperative cardiovascular evaluation for right hip surgery. She has chronic medical conditions that include peripheral arterial disease with intermittent claudication, previous tobacco use, hyperlipidemia, COPD and hypertension.  She had a nuclear stress test done in July 2017 for exertional dyspnea which showed no evidence of ischemia with normal ejection fraction. The patient has known history of bilateral calf claudication due to SFA disease. ABI in 2017 was 0.57 on the right and 0.49 on the left. Duplex showed occlusion of the proximal to mid left SFA and borderline significant right SFA disease. The patient developed a rash with cilostazol which was discontinued.  She has smoldering multiple myeloma which is being monitored by oncology.  She has not required treatment yet. Repeat vascular studies in 2019 showed improvement in ABIs to 0.93 on the right and 0.64 on the left.  Echocardiogram in October 2019 showed normal LV systolic function.  She has been doing reasonably well but she is significantly bothered by hip pain.  In addition, she reports worsening exertional dyspnea and she has been having hard time finishing her yard work.  She has occasional chest discomfort but not very frequently.     Past Medical History:  Diagnosis Date  . Arthritis   . Blood dyscrasia    BLEEDS EASILY   . COPD (chronic obstructive pulmonary disease) ([place])   . Diabetes mellitus without complication ([place])   .  Diffuse cystic mastopathy   . Hyperlipidemia   . Hypertension   . Hypothyroidism   . L4-L5 disc bulge   . Multiple myeloma ([place])   . Osteoporosis, post-menopausal   . PAD (peripheral artery disease) ([place])   . Renal insufficiency   . Shortness of breath dyspnea    WITH EXERTION   . Stroke ([place])    TIA     2016     WAS FOUND ON SCAN ORDERED FROM NEUROL  . Thyroid disease   . TIA (transient ischemic attack) 2016   left hand weakness    Past Surgical History:  Procedure Laterality Date  . ABDOMINAL HYSTERECTOMY  1990  . BACK SURGERY  2011  . BREAST EXCISIONAL BIOPSY Left    2010?   Marland Kitchen CATARACT EXTRACTION, BILATERAL    . COLONOSCOPY  1998  . EYE SURGERY Bilateral 05/06/14  . JOINT REPLACEMENT     LT KNEE  . KNEE SURGERY Left 2012  . OOPHORECTOMY  1990  . parathyroid transplant     2/3 THYROID REMOVED   ([facility])  . SPINE SURGERY    . THYROID SURGERY    . TONSILLECTOMY    . TOTAL SHOULDER ARTHROPLASTY Left 01/14/2016   Procedure: LEFT TOTAL SHOULDER ARTHROPLASTY;  Surgeon: Justice Britain, MD;  Location: [place];  Service: Orthopedics;  Laterality: Left;  . TOTAL SHOULDER ARTHROPLASTY Right 11/03/2016   Procedure: RIGHT TOTAL SHOULDER ARTHROPLASTY;  Surgeon: Justice Britain, MD;  Location: [place];  Service: Orthopedics;  Laterality: Right;  . ULNAR NERVE  REPAIR     LEFT  . vein closure procedure Left 2010     Current Outpatient Medications  Medication Sig Dispense Refill  . acetaminophen (TYLENOL) 325 MG tablet Take 650 mg by mouth every 6 (six) hours as needed.    Marland Kitchen aspirin EC 81 MG tablet Take 1 tablet (81 mg total) by mouth daily. 90 tablet 3  . atorvastatin (LIPITOR) 20 MG tablet Take 1 tablet (20 mg total) by mouth daily. 90 tablet 4  . benazepril (LOTENSIN) 20 MG tablet Take 1 tablet (20 mg total) by mouth daily. 90 tablet 4  . ferrous sulfate 324 (65 Fe) MG TBEC Take 324 mg by mouth daily.     Marland Kitchen levothyroxine (SYNTHROID, LEVOTHROID) 100 MCG tablet Take 1 tablet (100 mcg  total) by mouth daily. 90 tablet 4  . Multiple Vitamins-Minerals (CENTRUM SILVER ULTRA WOMENS PO) Take 1 tablet by mouth daily.     Marland Kitchen triamcinolone cream (KENALOG) 0.1 % Apply 1 application topically 2 (two) times daily. 30 g 0   No current facility-administered medications for this visit.     Allergies:   Codeine and Simvastatin    Social History:  The patient  reports that she quit smoking about 29 years ago. She has a 75.00 pack-year smoking history. She has never used smokeless tobacco. She reports previous alcohol use. She reports that she does not use drugs.   Family History:  The patient's family history includes Arthritis in her mother; Diabetes in her sister; Heart disease in her father and mother; Hyperlipidemia in her brother, father, and mother; Stroke in her mother.    ROS:  Please see the history of present illness.   Otherwise, review of systems are positive for none.   All other systems are reviewed and negative.    PHYSICAL EXAM: VS:  BP (!) 120/58 (BP Location: Left Arm, Patient Position: Sitting, Cuff Size: Normal)   Pulse 69   Temp 98.2 F (36.8 C)   Ht '5\' 5"'  (1.651 m)   Wt 175 lb (79.4 kg)   BMI 29.12 kg/m  , BMI Body mass index is 29.12 kg/m. GEN: Well nourished, well developed, in no acute distress  HEENT: normal  Neck: no JVD, carotid bruits, or masses Cardiac: RRR; no murmurs, rubs, or gallops,no edema  Respiratory:  clear to auscultation bilaterally, normal work of breathing GI: soft, nontender, nondistended, + BS MS: no deformity or atrophy  Skin: warm and dry, no rash Neuro:  Strength and sensation are intact Psych: euthymic mood, full affect   EKG:  EKG is ordered today. The ekg ordered today demonstrates normal sinus rhythm with a PVC and septal Q waves suggestive of an infarct.  These were not present before.   Recent Labs: 05/10/2018: TSH 0.514 08/08/2018: ALT 14; BUN 34; Creatinine, Ser 1.30; Hemoglobin 9.8; Platelets 208; Potassium 4.6;  Sodium 133    Lipid Panel    Component Value Date/Time   CHOL 157 12/28/2017 1451   TRIG 230 (H) 12/28/2017 1451   HDL 44 11/02/2016 1449   CHOLHDL 3.6 11/02/2016 1449   VLDL 46 (H) 12/28/2017 1451   LDLCALC 64 11/02/2016 1449      Wt Readings from Last 3 Encounters:  11/01/18 175 lb (79.4 kg)  08/21/18 170 lb (77.1 kg)  06/28/18 176 lb 8 oz (80.1 kg)       No flowsheet data found.    ASSESSMENT AND PLAN:  1. Preop cardiovascular evaluation for  hip /knee replacement: The patient  reports worsening exertional dyspnea and her functional capacity has been declining.  In addition, her EKG looks somewhat different from before with septal Q waves and a PVC.  Due to that, I recommend evaluation with a Lexiscan Myoview before surgery.  2. Peripheral arterial disease with moderate bilateral calf claudication worse on the left side:     Stable symptoms overall.  Continue medical therapy.  3.  Hyperlipidemia: Continue treatment with atorvastatin. Most recent lipid profile in  July showed an LDL of 64. Triglyceride was mildly elevated at 262.  4. Essential hypertension: Blood pressure is well controlled.  6.  Small abdominal aortic aneurysm: Recommend a follow-up study in 6 to 12 months.    Disposition:   FU with me in 6 months  Signed,  Kathlyn Sacramento, MD  11/01/2018 10:05 AM    [place]

## 2018-11-01 NOTE — Progress Notes (Signed)
Cardiology Office Note   Date:  11/01/2018   ID:  Bailey Ayala, DOB 11-12-1940, MRN [id number]  PCP:  Guadalupe Maple, MD  Cardiologist:   Kathlyn Sacramento, MD   Chief Complaint  Patient presents with  . other    Cardiac clearance for right hip surgery; Dr. Roxy Manns with [facility]. Meds reviewed by the pt. verbally.       History of Present Illness: Bailey Ayala is a 78 y.o. female who presents for a follow-up visit regarding peripheral arterial disease as well as preoperative cardiovascular evaluation for right hip surgery. She has chronic medical conditions that include peripheral arterial disease with intermittent claudication, previous tobacco use, hyperlipidemia, COPD and hypertension.  She had a nuclear stress test done in July 2017 for exertional dyspnea which showed no evidence of ischemia with normal ejection fraction. The patient has known history of bilateral calf claudication due to SFA disease. ABI in 2017 was 0.57 on the right and 0.49 on the left. Duplex showed occlusion of the proximal to mid left SFA and borderline significant right SFA disease. The patient developed a rash with cilostazol which was discontinued.  She has smoldering multiple myeloma which is being monitored by oncology.  She has not required treatment yet. Repeat vascular studies in 2019 showed improvement in ABIs to 0.93 on the right and 0.64 on the left.  Echocardiogram in October 2019 showed normal LV systolic function.  She has been doing reasonably well but she is significantly bothered by hip pain.  In addition, she reports worsening exertional dyspnea and she has been having hard time finishing her yard work.  She has occasional chest discomfort but not very frequently.     Past Medical History:  Diagnosis Date  . Arthritis   . Blood dyscrasia    BLEEDS EASILY   . COPD (chronic obstructive pulmonary disease) ([place])   . Diabetes mellitus without complication ([place])   .  Diffuse cystic mastopathy   . Hyperlipidemia   . Hypertension   . Hypothyroidism   . L4-L5 disc bulge   . Multiple myeloma ([place])   . Osteoporosis, post-menopausal   . PAD (peripheral artery disease) ([place])   . Renal insufficiency   . Shortness of breath dyspnea    WITH EXERTION   . Stroke ([place])    TIA     2016     WAS FOUND ON SCAN ORDERED FROM NEUROL  . Thyroid disease   . TIA (transient ischemic attack) 2016   left hand weakness    Past Surgical History:  Procedure Laterality Date  . ABDOMINAL HYSTERECTOMY  1990  . BACK SURGERY  2011  . BREAST EXCISIONAL BIOPSY Left    2010?   Marland Kitchen CATARACT EXTRACTION, BILATERAL    . COLONOSCOPY  1998  . EYE SURGERY Bilateral 05/06/14  . JOINT REPLACEMENT     LT KNEE  . KNEE SURGERY Left 2012  . OOPHORECTOMY  1990  . parathyroid transplant     2/3 THYROID REMOVED   ([facility])  . SPINE SURGERY    . THYROID SURGERY    . TONSILLECTOMY    . TOTAL SHOULDER ARTHROPLASTY Left 01/14/2016   Procedure: LEFT TOTAL SHOULDER ARTHROPLASTY;  Surgeon: Justice Britain, MD;  Location: [facility];  Service: Orthopedics;  Laterality: Left;  . TOTAL SHOULDER ARTHROPLASTY Right 11/03/2016   Procedure: RIGHT TOTAL SHOULDER ARTHROPLASTY;  Surgeon: Justice Britain, MD;  Location: [place];  Service: Orthopedics;  Laterality: Right;  . ULNAR NERVE  REPAIR     LEFT  . vein closure procedure Left 2010     Current Outpatient Medications  Medication Sig Dispense Refill  . acetaminophen (TYLENOL) 325 MG tablet Take 650 mg by mouth every 6 (six) hours as needed.    Marland Kitchen aspirin EC 81 MG tablet Take 1 tablet (81 mg total) by mouth daily. 90 tablet 3  . atorvastatin (LIPITOR) 20 MG tablet Take 1 tablet (20 mg total) by mouth daily. 90 tablet 4  . benazepril (LOTENSIN) 20 MG tablet Take 1 tablet (20 mg total) by mouth daily. 90 tablet 4  . ferrous sulfate 324 (65 Fe) MG TBEC Take 324 mg by mouth daily.     Marland Kitchen levothyroxine (SYNTHROID, LEVOTHROID) 100 MCG tablet Take 1 tablet (100 mcg  total) by mouth daily. 90 tablet 4  . Multiple Vitamins-Minerals (CENTRUM SILVER ULTRA WOMENS PO) Take 1 tablet by mouth daily.     Marland Kitchen triamcinolone cream (KENALOG) 0.1 % Apply 1 application topically 2 (two) times daily. 30 g 0   No current facility-administered medications for this visit.     Allergies:   Codeine and Simvastatin    Social History:  The patient  reports that she quit smoking about 29 years ago. She has a 75.00 pack-year smoking history. She has never used smokeless tobacco. She reports previous alcohol use. She reports that she does not use drugs.   Family History:  The patient's family history includes Arthritis in her mother; Diabetes in her sister; Heart disease in her father and mother; Hyperlipidemia in her brother, father, and mother; Stroke in her mother.    ROS:  Please see the history of present illness.   Otherwise, review of systems are positive for none.   All other systems are reviewed and negative.    PHYSICAL EXAM: VS:  BP (!) 120/58 (BP Location: Left Arm, Patient Position: Sitting, Cuff Size: Normal)   Pulse 69   Temp 98.2 F (36.8 C)   Ht '5\' 5"'  (1.651 m)   Wt 175 lb (79.4 kg)   BMI 29.12 kg/m  , BMI Body mass index is 29.12 kg/m. GEN: Well nourished, well developed, in no acute distress  HEENT: normal  Neck: no JVD, carotid bruits, or masses Cardiac: RRR; no murmurs, rubs, or gallops,no edema  Respiratory:  clear to auscultation bilaterally, normal work of breathing GI: soft, nontender, nondistended, + BS MS: no deformity or atrophy  Skin: warm and dry, no rash Neuro:  Strength and sensation are intact Psych: euthymic mood, full affect   EKG:  EKG is ordered today. The ekg ordered today demonstrates normal sinus rhythm with a PVC and septal Q waves suggestive of an infarct.  These were not present before.   Recent Labs: 05/10/2018: TSH 0.514 08/08/2018: ALT 14; BUN 34; Creatinine, Ser 1.30; Hemoglobin 9.8; Platelets 208; Potassium 4.6;  Sodium 133    Lipid Panel    Component Value Date/Time   CHOL 157 12/28/2017 1451   TRIG 230 (H) 12/28/2017 1451   HDL 44 11/02/2016 1449   CHOLHDL 3.6 11/02/2016 1449   VLDL 46 (H) 12/28/2017 1451   LDLCALC 64 11/02/2016 1449      Wt Readings from Last 3 Encounters:  11/01/18 175 lb (79.4 kg)  08/21/18 170 lb (77.1 kg)  06/28/18 176 lb 8 oz (80.1 kg)       No flowsheet data found.    ASSESSMENT AND PLAN:  1. Preop cardiovascular evaluation for  hip /knee replacement: The patient  reports worsening exertional dyspnea and her functional capacity has been declining.  In addition, her EKG looks somewhat different from before with septal Q waves and a PVC.  Due to that, I recommend evaluation with a Lexiscan Myoview before surgery.  2. Peripheral arterial disease with moderate bilateral calf claudication worse on the left side:     Stable symptoms overall.  Continue medical therapy.  3.  Hyperlipidemia: Continue treatment with atorvastatin. Most recent lipid profile in  July showed an LDL of 64. Triglyceride was mildly elevated at 262.  4. Essential hypertension: Blood pressure is well controlled.  6.  Small abdominal aortic aneurysm: Recommend a follow-up study in 6 to 12 months.    Disposition:   FU with me in 6 months  Signed,  Kathlyn Sacramento, MD  11/01/2018 10:05 AM    [place]

## 2018-11-01 NOTE — Patient Instructions (Signed)
Medication Instructions:  Your physician recommends that you continue on your current medications as directed. Please refer to the Current Medication list given to you today.  If you need a refill on your cardiac medications before your next appointment, please call your pharmacy.   Lab work: None ordered If you have labs (blood work) drawn today and your tests are completely normal, you will receive your results only by: Marland Kitchen MyChart Message (if you have MyChart) OR . A paper copy in the mail If you have any lab test that is abnormal or we need to change your treatment, we will call you to review the results.  Testing/Procedures: Your physician has requested that you have a lexiscan myoview. For further information please visit [email or web address]. Please follow instruction sheet, as given.    Follow-Up: At [facility]: [facility], you and your health needs are our priority.  As part of our continuing mission to provide you with exceptional heart care, we have created designated Provider Care Teams.  These Care Teams include your primary Cardiologist (physician) and Advanced Practice Providers (APPs -  Physician Assistants and Nurse Practitioners) who all work together to provide you with the care you need, when you need it. You will need a follow up appointment in 6 months.  Please call our office 2 months in advance to schedule this appointment.  You may see Kathlyn Sacramento, MD or one of the following Advanced Practice Providers on your designated Care Team:   Murray Hodgkins, NP Christell Faith, PA-C . Marrianne Mood, PA-C  Any Other Special Instructions Will Be Listed Below (If Applicable). [place]  Your caregiver has ordered a Stress Test with nuclear imaging. The purpose of this test is to evaluate the blood supply to your heart muscle. This procedure is referred to as a "Non-Invasive Stress Test." This is because other than having an IV started in your vein, nothing is inserted or "invades"  your body. Cardiac stress tests are done to find areas of poor blood flow to the heart by determining the extent of coronary artery disease (CAD). Some patients exercise on a treadmill, which naturally increases the blood flow to your heart, while others who are  unable to walk on a treadmill due to physical limitations have a pharmacologic/chemical stress agent called Lexiscan . This medicine will mimic walking on a treadmill by temporarily increasing your coronary blood flow.   Please note: these test may take anywhere between 2-4 hours to complete  PLEASE REPORT TO [place] AT THE FIRST DESK WILL DIRECT YOU WHERE TO GO  Date of Procedure:_____________________________________  Arrival Time for Procedure:______________________________  Instructions regarding medication:    __X__:  Hold other medications as follows:____Ok to take your morning medications with a sip of water_____________________________________________________________________________________________________________________________________________________________________________________________________________________________________________________________________________________  PLEASE NOTIFY THE OFFICE AT LEAST 24 HOURS IN ADVANCE IF YOU ARE UNABLE TO [place].  [phone] AND  PLEASE NOTIFY NUCLEAR MEDICINE AT [facility] AT LEAST 24 HOURS IN ADVANCE IF YOU ARE UNABLE TO KEEP YOUR APPOINTMENT. [phone]  How to prepare for your Myoview test:  1. Do not eat or drink after midnight 2. No caffeine for 24 hours prior to test 3. No smoking 24 hours prior to test. 4. Your medication may be taken with water.  If your doctor stopped a medication because of this test, do not take that medication. 5. Ladies, please do not wear dresses.  Skirts or pants are appropriate. Please wear a short sleeve shirt. 6. No perfume,  cologne or lotion. 7. Wear comfortable walking shoes. No  heels!

## 2018-11-06 ENCOUNTER — Encounter
Admission: RE | Admit: 2018-11-06 | Discharge: 2018-11-06 | Disposition: A | Discharge status: Home / Self Care | Payer: Medicare Other | Source: Ambulatory Visit | Attending: Cardiovascular Disease | Admitting: Cardiovascular Disease

## 2018-11-06 ENCOUNTER — Other Ambulatory Visit: Payer: Self-pay

## 2018-11-06 ENCOUNTER — Other Ambulatory Visit: Payer: Self-pay | Admitting: Physician Assistant

## 2018-11-06 ENCOUNTER — Encounter: Payer: Self-pay | Admitting: Physician Assistant

## 2018-11-06 DIAGNOSIS — Z01818 Encounter for other preprocedural examination: Secondary | ICD-10-CM | POA: Diagnosis not present

## 2018-11-06 DIAGNOSIS — R0602 Shortness of breath: Secondary | ICD-10-CM | POA: Insufficient documentation

## 2018-11-06 DIAGNOSIS — R0789 Other chest pain: Secondary | ICD-10-CM

## 2018-11-06 LAB — NM MYOCAR MULTI W/SPECT W/WALL MOTION / EF
LV dias vol: 87 mL (ref 46–106)
LV sys vol: 35 mL
MPHR: 143 {beats}/min
Peak HR: 118 {beats}/min
Percent HR: 82 %
Rest HR: 82 {beats}/min
TID: 0.96

## 2018-11-06 MED ORDER — NITROGLYCERIN 0.4 MG SL SUBL: 0.4000 mg | SUBLINGUAL_TABLET | SUBLINGUAL | Status: DC | PRN

## 2018-11-06 MED ORDER — TECHNETIUM TC 99M TETROFOSMIN IV KIT
30.8130 | PACK | Freq: Once | INTRAVENOUS | Status: AC | PRN
  Administered 2018-11-06: 30.813 via INTRAVENOUS

## 2018-11-06 MED ORDER — TECHNETIUM TC 99M TETROFOSMIN IV KIT
10.0000 | PACK | Freq: Once | INTRAVENOUS | Status: AC | PRN
  Administered 2018-11-06: 10.01 via INTRAVENOUS

## 2018-11-06 MED ORDER — REGADENOSON 0.4 MG/5ML IV SOLN
0.4000 mg | Freq: Once | INTRAVENOUS | Status: AC
  Administered 2018-11-06: 0.4 mg via INTRAVENOUS

## 2018-11-06 NOTE — Progress Notes (Unsigned)
Patient requested nitro but then decided she would prefer to use restroom over receipt of nitro. Nitro ordered cancelled.

## 2018-11-07 NOTE — Progress Notes (Signed)
[facility]  [address] [place], [place] [address] Phone: [phone]  Fax: [phone]   Clinic Day:  11/09/2018  Referring physician: Guadalupe Maple, MD  Chief Complaint: Bailey Ayala is a 78 y.o. female with smoldering myeloma who is seen for 3 month assessment.  HPI: The patient was last seen in the medical oncology clinic via telephone on 08/09/2018. At that time, she continued to have issues with her right hip.  She was planning on a right hip replacement after the COVID-19 pandemic.  Hemoglobin was 9.8.  MCV was 106.3.  Creatinine 1.3 (improved).  M-spike was 1.4 gm/dL.  She was seen in cardiology via telemedicine by Dr. Fletcher Anon on 08/21/2018.  Four month follow-up with EKG was scheduled. She was seen again on 11/01/2018.  Recommendation was to undergo a stress test prior to her knee replacement.   Mammogram on 11/01/2018 revealed no evidence of malignancy.   Cardiac stress test on 11/06/2018 was highly abnormal. She was recommended to undergo a cardiac cath and possible PCI on 11/19/2018 and to postpone her knee replacement surgery.   During the interim, she has been "not too good." She has had chest pain, which she thought was heartburn, in addition to chronic cough and shortness of breath. She reports pain in her legs when walking. She continues to have loose stools, back and hip pain.   She is not currently on a potassium supplement. She is very frustrated about her heart issues and inability to undergo her knee replacement.    Past Medical History:  Diagnosis Date  . Arthritis   . Blood dyscrasia    BLEEDS EASILY   . COPD (chronic obstructive pulmonary disease) ([place])   . Diabetes mellitus without complication ([place])   . Diffuse cystic mastopathy   . Hyperlipidemia   . Hypertension   . Hypothyroidism   . L4-L5 disc bulge   . Multiple myeloma ([place])   . Osteoporosis, post-menopausal   . PAD (peripheral artery disease) ([place])    . Renal insufficiency   . Shortness of breath dyspnea    WITH EXERTION   . Stroke ([place])    TIA     2016     WAS FOUND ON SCAN ORDERED FROM NEUROL  . Thyroid disease   . TIA (transient ischemic attack) 2016   left hand weakness    Past Surgical History:  Procedure Laterality Date  . ABDOMINAL HYSTERECTOMY  1990  . BACK SURGERY  2011  . BREAST EXCISIONAL BIOPSY Left    2010?   Marland Kitchen CATARACT EXTRACTION, BILATERAL    . COLONOSCOPY  1998  . EYE SURGERY Bilateral 05/06/14  . JOINT REPLACEMENT     LT KNEE  . KNEE SURGERY Left 2012  . OOPHORECTOMY  1990  . parathyroid transplant     2/3 THYROID REMOVED   ([place])  . SPINE SURGERY    . THYROID SURGERY    . TONSILLECTOMY    . TOTAL SHOULDER ARTHROPLASTY Left 01/14/2016   Procedure: LEFT TOTAL SHOULDER ARTHROPLASTY;  Surgeon: Justice Britain, MD;  Location: [place];  Service: Orthopedics;  Laterality: Left;  . TOTAL SHOULDER ARTHROPLASTY Right 11/03/2016   Procedure: RIGHT TOTAL SHOULDER ARTHROPLASTY;  Surgeon: Justice Britain, MD;  Location: [place];  Service: Orthopedics;  Laterality: Right;  . ULNAR NERVE REPAIR     LEFT  . vein closure procedure Left 2010    Family History  Problem Relation Age of Onset  . Heart disease Mother  MI  . Arthritis Mother   . Stroke Mother   . Hyperlipidemia Mother   . Heart disease Father        MI  . Hyperlipidemia Father   . Diabetes Sister   . Hyperlipidemia Brother     Social History:  reports that she quit smoking about 29 years ago. She has a 75.00 pack-year smoking history. She has never used smokeless tobacco. She reports previous alcohol use. She reports that she does not use drugs. Patient is a former 3 pack per day smoker for 25 years (75 pack year). Patient lives in [place]. She is a former Electrical engineer. Patient denies known exposures to radiation on toxins. The patient is alone today, with her daughters over the phone.  Allergies:  Allergies  Allergen Reactions  . Codeine Other (See  Comments)    hallucinations    . Simvastatin Diarrhea and Nausea Only    Current Medications: Current Outpatient Medications  Medication Sig Dispense Refill  . aspirin EC 81 MG tablet Take 1 tablet (81 mg total) by mouth daily. 90 tablet 3  . atorvastatin (LIPITOR) 20 MG tablet Take 1 tablet (20 mg total) by mouth daily. 90 tablet 4  . benazepril (LOTENSIN) 20 MG tablet Take 1 tablet (20 mg total) by mouth daily. 90 tablet 4  . ferrous sulfate 324 (65 Fe) MG TBEC Take 324 mg by mouth daily.     Marland Kitchen levothyroxine (SYNTHROID, LEVOTHROID) 100 MCG tablet Take 1 tablet (100 mcg total) by mouth daily. 90 tablet 4  . Multiple Vitamins-Minerals (CENTRUM SILVER ULTRA WOMENS PO) Take 1 tablet by mouth daily.     Marland Kitchen triamcinolone cream (KENALOG) 0.1 % Apply 1 application topically 2 (two) times daily. 30 g 0   No current facility-administered medications for this visit.     Review of Systems  Constitutional: Negative for chills, diaphoresis, fever, malaise/fatigue and weight loss.       "Not too good."  HENT: Negative.  Negative for congestion, ear pain, nosebleeds, sinus pain and sore throat.   Eyes: Negative.  Negative for blurred vision, double vision and photophobia.  Respiratory: Positive for cough (chronic) and shortness of breath (exertional). Negative for hemoptysis and sputum production.        COPD.  Cardiovascular: Positive for chest pain (abnormal stress test). Negative for palpitations, orthopnea, claudication, leg swelling and PND.  Gastrointestinal: Positive for diarrhea (loose stools for "years"). Negative for abdominal pain, blood in stool, constipation, melena, nausea and vomiting.  Genitourinary: Negative.  Negative for dysuria, frequency, hematuria and urgency.  Musculoskeletal: Positive for back pain (chronic) and joint pain (right hip). Negative for falls, myalgias and neck pain.  Skin: Negative.  Negative for itching and rash.  Neurological: Negative.  Negative for dizziness,  tremors, sensory change, speech change, focal weakness, weakness and headaches.  Endo/Heme/Allergies: Does not bruise/bleed easily.       HYPOthyroidism - on levothyroxine.  Psychiatric/Behavioral: Negative for depression and memory loss. The patient is not nervous/anxious and does not have insomnia.   All other systems reviewed and are negative.  Performance status (ECOG): 1  Vitals Blood pressure (!) 146/59, pulse 75, temperature 98.5 F (36.9 C), temperature source Tympanic, resp. rate 18, weight 166 lb 8.9 oz (75.5 kg), SpO2 100 %.   Physical Exam  Constitutional: She is oriented to person, place, and time. She appears well-developed and well-nourished. No distress.  HENT:  Head: Normocephalic and atraumatic.  Mouth/Throat: Oropharynx is clear and moist. No oropharyngeal exudate.  Brown hair. Mask.  Eyes: Pupils are equal, round, and reactive to light. Conjunctivae and EOM are normal. No scleral icterus.  Blue eyes.  Neck: Normal range of motion. Neck supple. No JVD present.  Cardiovascular: Normal rate, regular rhythm and normal heart sounds.  No murmur heard. Pulmonary/Chest: Effort normal and breath sounds normal. No respiratory distress. She has no wheezes.  Abdominal: Soft. Bowel sounds are normal. She exhibits no distension and no mass. There is no abdominal tenderness. There is no rebound and no guarding.  Musculoskeletal: Normal range of motion.        General: No edema.  Lymphadenopathy:    She has no cervical adenopathy.  Neurological: She is alert and oriented to person, place, and time.  Skin: Skin is warm and dry. No rash noted. She is not diaphoretic. No erythema. No pallor.  Psychiatric: She has a normal mood and affect. Her behavior is normal. Judgment and thought content normal.  Nursing note and vitals reviewed.   Appointment on 11/09/2018  Component Date Value Ref Range Status  . Sodium 11/09/2018 133* 135 - 145 mmol/L Final  . Potassium 11/09/2018 5.0   3.5 - 5.1 mmol/L Final  . Chloride 11/09/2018 106  98 - 111 mmol/L Final  . CO2 11/09/2018 19* 22 - 32 mmol/L Final  . Glucose, Bld 11/09/2018 107* 70 - 99 mg/dL Final  . BUN 11/09/2018 25* 8 - 23 mg/dL Final  . Creatinine, Ser 11/09/2018 1.28* 0.44 - 1.00 mg/dL Final  . Calcium 11/09/2018 9.0  8.9 - 10.3 mg/dL Final  . Total Protein 11/09/2018 8.3* 6.5 - 8.1 g/dL Final  . Albumin 11/09/2018 4.0  3.5 - 5.0 g/dL Final  . AST 11/09/2018 21  15 - 41 U/L Final  . ALT 11/09/2018 15  0 - 44 U/L Final  . Alkaline Phosphatase 11/09/2018 66  38 - 126 U/L Final  . Total Bilirubin 11/09/2018 0.4  0.3 - 1.2 mg/dL Final  . GFR calc non Af Amer 11/09/2018 40* >60 mL/min Final  . GFR calc Af Amer 11/09/2018 47* >60 mL/min Final  . Anion gap 11/09/2018 8  5 - 15 Final   Performed at [facility], [address], [place], [place] [address]  . WBC 11/09/2018 5.3  4.0 - 10.5 K/uL Final  . RBC 11/09/2018 2.62* 3.87 - 5.11 MIL/uL Final  . Hemoglobin 11/09/2018 9.2* 12.0 - 15.0 g/dL Final  . HCT 11/09/2018 28.8* 36.0 - 46.0 % Final  . MCV 11/09/2018 109.9* 80.0 - 100.0 fL Final  . MCH 11/09/2018 35.1* 26.0 - 34.0 pg Final  . MCHC 11/09/2018 31.9  30.0 - 36.0 g/dL Final  . RDW 11/09/2018 14.4  11.5 - 15.5 % Final  . Platelets 11/09/2018 214  150 - 400 K/uL Final  . nRBC 11/09/2018 0.0  0.0 - 0.2 % Final  . Neutrophils Relative % 11/09/2018 55  % Final  . Neutro Abs 11/09/2018 2.9  1.7 - 7.7 K/uL Final  . Lymphocytes Relative 11/09/2018 21  % Final  . Lymphs Abs 11/09/2018 1.1  0.7 - 4.0 K/uL Final  . Monocytes Relative 11/09/2018 7  % Final  . Monocytes Absolute 11/09/2018 0.4  0.1 - 1.0 K/uL Final  . Eosinophils Relative 11/09/2018 16  % Final  . Eosinophils Absolute 11/09/2018 0.8* 0.0 - 0.5 K/uL Final  . Basophils Relative 11/09/2018 1  % Final  . Basophils Absolute 11/09/2018 0.0  0.0 - 0.1 K/uL Final  . Immature Granulocytes 11/09/2018 0  %  Final  . Abs Immature Granulocytes  11/09/2018 0.01  0.00 - 0.07 K/uL Final   Performed at [facility], [address] [place], [place] [address]    Assessment:  Bailey Ayala is a 78 y.o. female with smoldering multiple myeloma.  SPEP on 06/21/2017 revealed a 1.2 gm/dL monoclonal spike.  Random urine revealed 4.6 mg/dL protein and a 40.6% M-spike.  Calcium, albumin, and serum protein were normal.  Work-up on 06/30/2017 revealed a hematocrit of 31.1, hemoglobin 10.4, MCV 97.4, platelets 227,000, WBC 5700 with an ANC of 3300.  SPEP revealed a 1.1 gm/dL IgG monoclonal protein with lambda light chain specificity and monoclonal free lambda light chains (Bence-Jones protein).  IgG was 1660 ([phone]) and IgA was 48 (64-422).  Kappa free light chains were 23.7, lambda free light chains were 142.2 and ratio 0.17 (0.26-1.65).  Beta2-microglobulin was 4.7 (0.6-2.4).  Normal studies included:  B12, folate, and ferritin (75).  TSH was 0.204 (low).  24 hour UPEP revealed kappa free light chains 28.80 mg/L, lambda free light chains 63.80 mg/L and ration 0.45 (2.04 - 10.37).  M spike was 27.4% (16 mg/24 hours).  Bone survey on 06/30/2017 revealed no suspicious focal bony lesions.  Bone marrow aspirate and biopsy on 07/26/2017 revealed a slightly hypercellular marrow for age with trilineage hematopoiesis.  There was 13% plasmacytosis.  IHC stains for CD138 and in situ hybridization for kappa and lambda revealed interstitial cells and small clusters.  Lambda light chains appeared restricted.  Features were felt compatible with plasma cell neoplasm.  PET scan on 08/14/2017 revealed no hypermetabolic lesions of myeloma.  There was a 3.6 cm infrarenal aortic aneurysm.  Ultrasound in 2 years was recommended.  She had a 4 mm LLL nodule too small to characterize.  SPEP has been followed: 1.1 on 06/30/2017, 1.2 on 11/14/2017, 1.3 on 01/26/2018, 1.2 on 05/10/2018, 1.4 on 08/08/2018, and 1.2 on 11/09/2018.  IgG has been followed:  1660 on  06/30/2017, 1684 on 11/14/2017, 1795 on 01/26/2018, and 2054 on 08/08/2018.  Chest CT on 01/24/2018 revealed persistent LLL nodule.  Area calcified and felt to be consistent with a granuloma.  There were no other suspicious pulmonary nodules.  There was new Schmorl's node formation involving the inferior endplate of G92.  There were stable small scattered osseous lesions related to underlying multiple myeloma.  PET scan on 05/15/2018 revealed no hypermetabolic lesions characteristic of active myeloma.  There was stable hypermetabolic apical wall of the left ventricle, there was potentially a small pseudoaneurysm in this vicinity, but no appreciable mass was noted on 01/24/2018.  There was an infrarenal abdominal aortic aneurysm, 3.6 cm in diameter. Follow-up ultrasound in 2 years was recommended.  Other imaging findings of potential clinical significance: aortic atherosclerosis and coronary atherosclerosis.  She has stage III chronic kidney disease (Cr 1.55; CrCl 32.3 ml/min).     She has a macrocytic anemia.  B12, folate, and TSH were normal on 05/10/2018.  Retic was 1.6%.  Ferritin and iron studies were normal on 05/10/2018.  Appetite remains poor.  Last colonoscopy was > 10 years ago (she declines repeat).  She has a 75 pack year smoking history.  She has COPD.  Chest CT without contrast on 07/25/2017 revealed no suspicious pulmonary nodules.  Symptomatically, she denies any bone pain, infections or B symptoms.  She has had chest pain and is undergoing a cardiac evaluation.   Plan: 1.   Labs today: CBC with diff, CMP, SPEP, FLCA.  2.  Smoldering multiple myeloma Clinically, she continues to do well. M spike is 1.2 g/dL. Lambda free light chains to 220.2 (ratio 0.07). Continue every 35-monthsurveillance. 3.   Back and right hip pain PET scan confirmed no active myeloma. Patient's hip replacement is postponed secondary to cardiac evaluation. 4.   Renal insufficieny              Creatinine 1.28.             Patient followed by Dr. LHolley Raring 5.   Macrocytic anemia             Hematocrit 28.8.  Hemoglobin 9.2.  MCV 109.9.    B12, folate and TSH are normal.    She has no known liver disease.    Etiology may be secondary to a myelodysplastic syndrome (MDS).    Last marrow revealed no evidence of MDS although MCV was normal at the time.   Continue surveillance 6.   [place] (CBC with diff, retic, BMP). 7.   RTC in 3 months for MD assessment and labs (CBC with diff, CMP, SPEP and FLCA).  I discussed the assessment and treatment plan with the patient.  The patient was provided an opportunity to ask questions and all were answered.  The patient agreed with the plan and demonstrated an understanding of the instructions.  The patient was advised to call back if the symptoms worsen or if the condition fails to improve as anticipated.  I provided 20 minutes of face-to-face time during this this encounter and > 50% was spent counseling as documented under my assessment and plan.    MLequita Asal MD, PhD    11/09/2018, 12:30 PM  I, Molly Dorshimer, am acting as sEducation administratorfor [employer] CMike Gip MD, PhD.  I, Melissa C. CMike Gip MD, have reviewed the above documentation for accuracy and completeness, and I agree with the above.

## 2018-11-08 ENCOUNTER — Telehealth: Payer: Self-pay

## 2018-11-08 ENCOUNTER — Other Ambulatory Visit: Payer: Self-pay

## 2018-11-08 DIAGNOSIS — R9439 Abnormal result of other cardiovascular function study: Secondary | ICD-10-CM

## 2018-11-08 NOTE — Telephone Encounter (Signed)
Pt made aware of her stress test results and Dr.Arida's recommendation. Pt sts that she is unable to have the cardiac cath tomorrow, she is scheduled to see her oncologist. Pt sts that July 27th may work. She will need to tale with her daughters and contact her orthopaedic. Pt sts that she is having a lot of hip pain, she will contact her orthopaedic to see if she can have another steroidal injection prior to the initiation of possile uninterrupted DAPT on 7/27.  Adv the pt that I am currently in clinic and will call her back this afternoon after she has had the opportunity to talk with her daughters and other physician.

## 2018-11-08 NOTE — Telephone Encounter (Signed)
-----   Message from Wellington Hampshire, MD sent at 11/08/2018  7:55 AM EDT ----- Inform patient that  stress test was highly abnormal. I recommend cardiac cath and possible PCI. I can do tomorrow at [facility] ( requires rapid COVID testing same day) or I can do on Monday July 27th at [facility]. I recommend postponing hip surgery.

## 2018-11-08 NOTE — Telephone Encounter (Signed)
Spoke with the pt. Pt sts that she will be able to have her hip injection on 11/13/18 and would like to proceed with the cardiac cath on 11/19/18.  Pt cath is scheduled for 11/19/18 @ 9:30amd with Dr.Arida. Pt given pre-procedure instructions. -NPO after midnight. Morning meds with just a sip of water. -Pt will arrive at the [facility] mall 1 hour prior -Adv the pt that starting on 11/12/18 1 support person will be able to attend, however that may change -Pt will have her COVID test on 11/15/18 @ 1pm and will then self quarantine -Pt is schedule labs with her oncologist on 11/09/18 that include and Cmet and Cbc. Labs will be viewable in Epic.  Pt adv that she should not wait for the procedure and she is to seek emergent care if cardiac symptoms develop. Pt verbalized understanding and has no questions or concerns at this time.

## 2018-11-08 NOTE — Telephone Encounter (Signed)
Patient has some updates she wants to discuss with the [facility], please call back when available [phone]  Please advise

## 2018-11-09 ENCOUNTER — Telehealth: Payer: Self-pay | Admitting: *Deleted

## 2018-11-09 ENCOUNTER — Inpatient Hospital Stay (HOSPITAL_BASED_OUTPATIENT_CLINIC_OR_DEPARTMENT_OTHER): Payer: Medicare Other | Admitting: Hematology and Oncology

## 2018-11-09 ENCOUNTER — Inpatient Hospital Stay: Payer: Medicare Other | Attending: Hematology and Oncology

## 2018-11-09 ENCOUNTER — Encounter: Payer: Self-pay | Admitting: Hematology and Oncology

## 2018-11-09 VITALS — BP 146/59 | HR 75 | Temp 98.5°F | Resp 18 | Wt 166.6 lb

## 2018-11-09 DIAGNOSIS — J449 Chronic obstructive pulmonary disease, unspecified: Secondary | ICD-10-CM | POA: Diagnosis not present

## 2018-11-09 DIAGNOSIS — Z8673 Personal history of transient ischemic attack (TIA), and cerebral infarction without residual deficits: Secondary | ICD-10-CM

## 2018-11-09 DIAGNOSIS — M25551 Pain in right hip: Secondary | ICD-10-CM | POA: Diagnosis not present

## 2018-11-09 DIAGNOSIS — R079 Chest pain, unspecified: Secondary | ICD-10-CM | POA: Insufficient documentation

## 2018-11-09 DIAGNOSIS — D472 Monoclonal gammopathy: Secondary | ICD-10-CM

## 2018-11-09 DIAGNOSIS — Z01812 Encounter for preprocedural laboratory examination: Secondary | ICD-10-CM

## 2018-11-09 DIAGNOSIS — Z87891 Personal history of nicotine dependence: Secondary | ICD-10-CM | POA: Diagnosis not present

## 2018-11-09 DIAGNOSIS — E039 Hypothyroidism, unspecified: Secondary | ICD-10-CM | POA: Insufficient documentation

## 2018-11-09 DIAGNOSIS — E785 Hyperlipidemia, unspecified: Secondary | ICD-10-CM

## 2018-11-09 DIAGNOSIS — I739 Peripheral vascular disease, unspecified: Secondary | ICD-10-CM

## 2018-11-09 DIAGNOSIS — I129 Hypertensive chronic kidney disease with stage 1 through stage 4 chronic kidney disease, or unspecified chronic kidney disease: Secondary | ICD-10-CM | POA: Insufficient documentation

## 2018-11-09 DIAGNOSIS — E1122 Type 2 diabetes mellitus with diabetic chronic kidney disease: Secondary | ICD-10-CM | POA: Diagnosis not present

## 2018-11-09 DIAGNOSIS — C9000 Multiple myeloma not having achieved remission: Secondary | ICD-10-CM | POA: Diagnosis not present

## 2018-11-09 DIAGNOSIS — D539 Nutritional anemia, unspecified: Secondary | ICD-10-CM | POA: Insufficient documentation

## 2018-11-09 DIAGNOSIS — Z7189 Other specified counseling: Secondary | ICD-10-CM

## 2018-11-09 DIAGNOSIS — R197 Diarrhea, unspecified: Secondary | ICD-10-CM | POA: Insufficient documentation

## 2018-11-09 DIAGNOSIS — M1990 Unspecified osteoarthritis, unspecified site: Secondary | ICD-10-CM | POA: Insufficient documentation

## 2018-11-09 DIAGNOSIS — Z79899 Other long term (current) drug therapy: Secondary | ICD-10-CM | POA: Diagnosis not present

## 2018-11-09 DIAGNOSIS — R9439 Abnormal result of other cardiovascular function study: Secondary | ICD-10-CM

## 2018-11-09 DIAGNOSIS — Z7982 Long term (current) use of aspirin: Secondary | ICD-10-CM | POA: Diagnosis not present

## 2018-11-09 LAB — COMPREHENSIVE METABOLIC PANEL
ALT: 15 U/L (ref 0–44)
AST: 21 U/L (ref 15–41)
Albumin: 4 g/dL (ref 3.5–5.0)
Alkaline Phosphatase: 66 U/L (ref 38–126)
Anion gap: 8 (ref 5–15)
BUN: 25 mg/dL — ABNORMAL HIGH (ref 8–23)
CO2: 19 mmol/L — ABNORMAL LOW (ref 22–32)
Calcium: 9 mg/dL (ref 8.9–10.3)
Chloride: 106 mmol/L (ref 98–111)
Creatinine, Ser: 1.28 mg/dL — ABNORMAL HIGH (ref 0.44–1.00)
GFR calc Af Amer: 47 mL/min — ABNORMAL LOW (ref >60)
GFR calc non Af Amer: 40 mL/min — ABNORMAL LOW (ref >60)
Glucose, Bld: 107 mg/dL — ABNORMAL HIGH (ref 70–99)
Potassium: 5 mmol/L (ref 3.5–5.1)
Sodium: 133 mmol/L — ABNORMAL LOW (ref 135–145)
Total Bilirubin: 0.4 mg/dL (ref 0.3–1.2)
Total Protein: 8.3 g/dL — ABNORMAL HIGH (ref 6.5–8.1)

## 2018-11-09 LAB — CBC WITH DIFFERENTIAL/PLATELET
Abs Immature Granulocytes: 0.01 10*3/uL (ref 0.00–0.07)
Basophils Absolute: 0 10*3/uL (ref 0.0–0.1)
Basophils Relative: 1 %
Eosinophils Absolute: 0.8 10*3/uL — ABNORMAL HIGH (ref 0.0–0.5)
Eosinophils Relative: 16 %
HCT: 28.8 % — ABNORMAL LOW (ref 36.0–46.0)
Hemoglobin: 9.2 g/dL — ABNORMAL LOW (ref 12.0–15.0)
Immature Granulocytes: 0 %
Lymphocytes Relative: 21 %
Lymphs Abs: 1.1 10*3/uL (ref 0.7–4.0)
MCH: 35.1 pg — ABNORMAL HIGH (ref 26.0–34.0)
MCHC: 31.9 g/dL (ref 30.0–36.0)
MCV: 109.9 fL — ABNORMAL HIGH (ref 80.0–100.0)
Monocytes Absolute: 0.4 10*3/uL (ref 0.1–1.0)
Monocytes Relative: 7 %
Neutro Abs: 2.9 10*3/uL (ref 1.7–7.7)
Neutrophils Relative %: 55 %
Platelets: 214 10*3/uL (ref 150–400)
RBC: 2.62 MIL/uL — ABNORMAL LOW (ref 3.87–5.11)
RDW: 14.4 % (ref 11.5–15.5)
WBC: 5.3 10*3/uL (ref 4.0–10.5)
nRBC: 0 % (ref 0.0–0.2)

## 2018-11-09 NOTE — Telephone Encounter (Signed)
-----   Message from Ace Gins sent at 11/09/2018 11:42 AM EDT ----- Regarding: Order accidentally released Hello, A lady from the [facility] called.  Wanted to make office aware she accidentally released one of our orders ([id number]).  If there are any questions please call [phone].Thanks

## 2018-11-09 NOTE — Progress Notes (Signed)
Pt here for follow up. Denies any concerns. Patient states she failed her stress test 2 days ago and now has to have stents placed 11/19/18.

## 2018-11-09 NOTE — Telephone Encounter (Signed)
Looks like the [facility] lab order was released. Re-entered order.

## 2018-11-11 LAB — PROTEIN ELECTROPHORESIS, SERUM
A/G Ratio: 1 (ref 0.7–1.7)
Albumin ELP: 3.8 g/dL (ref 2.9–4.4)
Alpha-1-Globulin: 0.3 g/dL (ref 0.0–0.4)
Alpha-2-Globulin: 1.2 g/dL — ABNORMAL HIGH (ref 0.4–1.0)
Beta Globulin: 0.9 g/dL (ref 0.7–1.3)
Gamma Globulin: 1.6 g/dL (ref 0.4–1.8)
Globulin, Total: 3.9 g/dL (ref 2.2–3.9)
M-Spike, %: 1.2 g/dL — ABNORMAL HIGH
Total Protein ELP: 7.7 g/dL (ref 6.0–8.5)

## 2018-11-12 LAB — KAPPA/LAMBDA LIGHT CHAINS
Kappa free light chain: 16.4 mg/L (ref 3.3–19.4)
Kappa, lambda light chain ratio: 0.07 — ABNORMAL LOW (ref 0.26–1.65)
Lambda free light chains: 220.2 mg/L — ABNORMAL HIGH (ref 5.7–26.3)

## 2018-11-14 DIAGNOSIS — M25551 Pain in right hip: Secondary | ICD-10-CM | POA: Diagnosis not present

## 2018-11-15 ENCOUNTER — Other Ambulatory Visit: Payer: Self-pay

## 2018-11-15 ENCOUNTER — Telehealth: Payer: Self-pay | Admitting: Cardiovascular Disease

## 2018-11-15 ENCOUNTER — Other Ambulatory Visit
Admission: RE | Admit: 2018-11-15 | Discharge: 2018-11-15 | Disposition: A | Discharge status: Home / Self Care | Payer: Medicare Other | Source: Ambulatory Visit | Attending: Cardiovascular Disease | Admitting: Cardiovascular Disease

## 2018-11-15 DIAGNOSIS — Z1159 Encounter for screening for other viral diseases: Secondary | ICD-10-CM | POA: Diagnosis not present

## 2018-11-15 LAB — SARS CORONAVIRUS 2 (TAT 6-24 HRS): SARS Coronavirus 2: NEGATIVE

## 2018-11-15 NOTE — Telephone Encounter (Signed)
Patient calling  Patient has an upcoming procedure on 7/27 Would like some information about process, would like to know if there can be a person with her or if she will be staying overnight  Please call to discuss

## 2018-11-15 NOTE — Telephone Encounter (Signed)
Spoke with the pt. Adv her that she is able to bring 1 support person with her for her procedure on 11/19/18.  Adv the pt to plan for an overnight stay incase an intervention (pci) is needed.  Pt has had labwork on 7/17/20with oncology that is viewable in Epic that does include a cbc and cmet. She had her COVID test today Adv the pt that I will fwd the update to Dr. Fletcher Anon.  Pt verbalized understanding to the instruction given and voiced appreciation for the call back.

## 2018-11-19 ENCOUNTER — Ambulatory Visit
Admission: RE | Admit: 2018-11-19 | Discharge: 2018-11-19 | Disposition: A | Discharge status: Home / Self Care | Payer: Medicare Other | Attending: Cardiovascular Disease | Admitting: Cardiovascular Disease

## 2018-11-19 ENCOUNTER — Encounter
Admission: RE | Disposition: A | Discharge status: Home / Self Care | Payer: Medicare Other | Source: Home / Self Care | Attending: Cardiovascular Disease

## 2018-11-19 ENCOUNTER — Encounter: Payer: Self-pay | Admitting: Cardiovascular Disease

## 2018-11-19 DIAGNOSIS — I714 Abdominal aortic aneurysm, without rupture: Secondary | ICD-10-CM | POA: Diagnosis not present

## 2018-11-19 DIAGNOSIS — R9439 Abnormal result of other cardiovascular function study: Secondary | ICD-10-CM

## 2018-11-19 DIAGNOSIS — Z8673 Personal history of transient ischemic attack (TIA), and cerebral infarction without residual deficits: Secondary | ICD-10-CM | POA: Insufficient documentation

## 2018-11-19 DIAGNOSIS — I2510 Atherosclerotic heart disease of native coronary artery without angina pectoris: Secondary | ICD-10-CM | POA: Diagnosis not present

## 2018-11-19 DIAGNOSIS — Z79890 Hormone replacement therapy (postmenopausal): Secondary | ICD-10-CM | POA: Insufficient documentation

## 2018-11-19 DIAGNOSIS — J449 Chronic obstructive pulmonary disease, unspecified: Secondary | ICD-10-CM | POA: Diagnosis not present

## 2018-11-19 DIAGNOSIS — I10 Essential (primary) hypertension: Secondary | ICD-10-CM | POA: Diagnosis not present

## 2018-11-19 DIAGNOSIS — M1990 Unspecified osteoarthritis, unspecified site: Secondary | ICD-10-CM | POA: Insufficient documentation

## 2018-11-19 DIAGNOSIS — Z7982 Long term (current) use of aspirin: Secondary | ICD-10-CM | POA: Diagnosis not present

## 2018-11-19 DIAGNOSIS — C9000 Multiple myeloma not having achieved remission: Secondary | ICD-10-CM | POA: Diagnosis not present

## 2018-11-19 DIAGNOSIS — E039 Hypothyroidism, unspecified: Secondary | ICD-10-CM | POA: Diagnosis not present

## 2018-11-19 DIAGNOSIS — E785 Hyperlipidemia, unspecified: Secondary | ICD-10-CM | POA: Insufficient documentation

## 2018-11-19 DIAGNOSIS — E1151 Type 2 diabetes mellitus with diabetic peripheral angiopathy without gangrene: Secondary | ICD-10-CM | POA: Insufficient documentation

## 2018-11-19 DIAGNOSIS — Z87891 Personal history of nicotine dependence: Secondary | ICD-10-CM | POA: Diagnosis not present

## 2018-11-19 DIAGNOSIS — Z8249 Family history of ischemic heart disease and other diseases of the circulatory system: Secondary | ICD-10-CM | POA: Diagnosis not present

## 2018-11-19 DIAGNOSIS — Z885 Allergy status to narcotic agent status: Secondary | ICD-10-CM | POA: Insufficient documentation

## 2018-11-19 DIAGNOSIS — Z79899 Other long term (current) drug therapy: Secondary | ICD-10-CM | POA: Diagnosis not present

## 2018-11-19 HISTORY — PX: LEFT HEART CATH AND CORONARY ANGIOGRAPHY: CATH118249

## 2018-11-19 SURGERY — LEFT HEART CATH AND CORONARY ANGIOGRAPHY
Anesthesia: Moderate Sedation | Laterality: Left

## 2018-11-19 MED ORDER — MIDAZOLAM HCL 2 MG/2ML IJ SOLN
INTRAMUSCULAR | Status: DC | PRN
  Administered 2018-11-19: 1 mg via INTRAVENOUS

## 2018-11-19 MED ORDER — ACETAMINOPHEN 325 MG PO TABS: 650.0000 mg | ORAL_TABLET | ORAL | Status: DC | PRN

## 2018-11-19 MED ORDER — METOPROLOL TARTRATE 25 MG PO TABS: 25.0000 mg | ORAL_TABLET | Freq: Two times a day (BID) | ORAL | 6 refills | Status: DC

## 2018-11-19 MED ORDER — HEPARIN SODIUM (PORCINE) 1000 UNIT/ML IJ SOLN
INTRAMUSCULAR | Status: AC
  Filled 2018-11-19: qty 1

## 2018-11-19 MED ORDER — SODIUM CHLORIDE 0.9% FLUSH: 3.0000 mL | Freq: Two times a day (BID) | INTRAVENOUS | Status: DC

## 2018-11-19 MED ORDER — SODIUM CHLORIDE 0.9 % IV SOLN: 250.0000 mL | INTRAVENOUS | Status: DC | PRN

## 2018-11-19 MED ORDER — SODIUM CHLORIDE 0.9 % IV SOLN
INTRAVENOUS | Status: DC
  Administered 2018-11-19: 10:00:00 via INTRAVENOUS

## 2018-11-19 MED ORDER — HEPARIN (PORCINE) IN NACL 1000-0.9 UT/500ML-% IV SOLN
INTRAVENOUS | Status: AC
  Filled 2018-11-19: qty 1000

## 2018-11-19 MED ORDER — ASPIRIN 81 MG PO CHEW: 81.0000 mg | CHEWABLE_TABLET | ORAL | Status: DC

## 2018-11-19 MED ORDER — SODIUM CHLORIDE 0.9% FLUSH: 3.0000 mL | INTRAVENOUS | Status: DC | PRN

## 2018-11-19 MED ORDER — ONDANSETRON HCL 4 MG/2ML IJ SOLN: 4.0000 mg | Freq: Four times a day (QID) | INTRAMUSCULAR | Status: DC | PRN

## 2018-11-19 MED ORDER — VERAPAMIL HCL 2.5 MG/ML IV SOLN
INTRAVENOUS | Status: AC
  Filled 2018-11-19: qty 2

## 2018-11-19 MED ORDER — VERAPAMIL HCL 2.5 MG/ML IV SOLN
INTRAVENOUS | Status: DC | PRN
  Administered 2018-11-19: 2.5 mg via INTRAVENOUS

## 2018-11-19 MED ORDER — FENTANYL CITRATE (PF) 100 MCG/2ML IJ SOLN
INTRAMUSCULAR | Status: AC
  Filled 2018-11-19: qty 2

## 2018-11-19 MED ORDER — FENTANYL CITRATE (PF) 100 MCG/2ML IJ SOLN
INTRAMUSCULAR | Status: DC | PRN
  Administered 2018-11-19: 50 ug via INTRAVENOUS

## 2018-11-19 MED ORDER — IOHEXOL 300 MG/ML  SOLN
INTRAMUSCULAR | Status: DC | PRN
  Administered 2018-11-19: 55 mL via INTRA_ARTERIAL

## 2018-11-19 MED ORDER — MIDAZOLAM HCL 2 MG/2ML IJ SOLN
INTRAMUSCULAR | Status: AC
  Filled 2018-11-19: qty 2

## 2018-11-19 MED ORDER — SODIUM CHLORIDE 0.9 % IV SOLN: INTRAVENOUS | Status: DC

## 2018-11-19 MED ORDER — HEPARIN SODIUM (PORCINE) 1000 UNIT/ML IJ SOLN
INTRAMUSCULAR | Status: DC | PRN
  Administered 2018-11-19: 4000 [IU] via INTRAVENOUS

## 2018-11-19 SURGICAL SUPPLY — 8 items
CATH INFINITI 5FR JK (CATHETERS) ×2 IMPLANT
CATH INFINITI JR4 5F (CATHETERS) ×2 IMPLANT
DEVICE RAD TR BAND REGULAR (VASCULAR PRODUCTS) ×2 IMPLANT
GLIDESHEATH SLEND SS 6F .021 (SHEATH) ×2 IMPLANT
KIT MANI 3VAL PERCEP (MISCELLANEOUS) ×3 IMPLANT
PACK CARDIAC CATH (CUSTOM PROCEDURE TRAY) ×3 IMPLANT
WIRE HITORQ VERSACORE ST 145CM (WIRE) ×2 IMPLANT
WIRE ROSEN-J .035X260CM (WIRE) ×4 IMPLANT

## 2018-11-19 NOTE — Interval H&P Note (Signed)
Cath Lab Visit (complete for each Cath Lab visit)  Clinical Evaluation Leading to the Procedure:   ACS: No.  Non-ACS:    Anginal Classification: CCS III  Anti-ischemic medical therapy: No Therapy  Non-Invasive Test Results: High-risk stress test findings: cardiac mortality >3%/year  Prior CABG: No previous CABG      History and Physical Interval Note:  11/19/2018 9:42 AM  Bailey Ayala  has presented today for surgery, with the diagnosis of LT Cath    Abnormal stress test.  The various methods of treatment have been discussed with the patient and family. After consideration of risks, benefits and other options for treatment, the patient has consented to  Procedure(s): LEFT HEART CATH AND CORONARY ANGIOGRAPHY (Left) as a surgical intervention.  The patient's history has been reviewed, patient examined, no change in status, stable for surgery.  I have reviewed the patient's chart and labs.  Questions were answered to the patient's satisfaction.     Kathlyn [place]

## 2018-11-19 NOTE — Discharge Instructions (Signed)
Radial Site Care ° °This sheet gives you information about how to care for yourself after your procedure. Your health care provider may also give you more specific instructions. If you have problems or questions, contact your health care provider. °What can I expect after the procedure? °After the procedure, it is common to have: °· Bruising and tenderness at the catheter insertion area. °Follow these instructions at home: °Medicines °· Take over-the-counter and prescription medicines only as told by your health care provider. °Insertion site care °· Follow instructions from your health care provider about how to take care of your insertion site. Make sure you: °? Wash your hands with soap and water before you change your bandage (dressing). If soap and water are not available, use hand sanitizer. °? Change your dressing as told by your health care provider. °? Leave stitches (sutures), skin glue, or adhesive strips in place. These skin closures may need to stay in place for 2 weeks or longer. If adhesive strip edges start to loosen and curl up, you may trim the loose edges. Do not remove adhesive strips completely unless your health care provider tells you to do that. °· Check your insertion site every day for signs of infection. Check for: °? Redness, swelling, or pain. °? Fluid or blood. °? Pus or a bad smell. °? Warmth. °· Do not take baths, swim, or use a hot tub until your health care provider approves. °· You may shower 24-48 hours after the procedure, or as directed by your health care provider. °? Remove the dressing and gently wash the site with plain soap and water. °? Pat the area dry with a clean towel. °? Do not rub the site. That could cause bleeding. °· Do not apply powder or lotion to the site. °Activity ° °· For 24 hours after the procedure, or as directed by your health care provider: °? Do not flex or bend the affected arm. °? Do not push or pull heavy objects with the affected arm. °? Do not  drive yourself home from the hospital or clinic. You may drive 24 hours after the procedure unless your health care provider tells you not to. °? Do not operate machinery or power tools. °· Do not lift anything that is heavier than 10 lb (4.5 kg), or the limit that you are told, until your health care provider says that it is safe. °· Ask your health care provider when it is okay to: °? Return to work or school. °? Resume usual physical activities or sports. °? Resume sexual activity. °General instructions °· If the catheter site starts to bleed, raise your arm and put firm pressure on the site. If the bleeding does not stop, get help right away. This is a medical emergency. °· If you went home on the same day as your procedure, a responsible adult should be with you for the first 24 hours after you arrive home. °· Keep all follow-up visits as told by your health care provider. This is important. °Contact a health care provider if: °· You have a fever. °· You have redness, swelling, or yellow drainage around your insertion site. °Get help right away if: °· You have unusual pain at the radial site. °· The catheter insertion area swells very fast. °· The insertion area is bleeding, and the bleeding does not stop when you hold steady pressure on the area. °· Your arm or hand becomes pale, cool, tingly, or numb. °These symptoms may represent a serious problem   that is an emergency. Do not wait to see if the symptoms will go away. Get medical help right away. Call your local emergency services (911 in the [place] Do not drive yourself to the hospital. °Summary °· After the procedure, it is common to have bruising and tenderness at the site. °· Follow instructions from your health care provider about how to take care of your radial site wound. Check the wound every day for signs of infection. °· Do not lift anything that is heavier than 10 lb (4.5 kg), or the limit that you are told, until your health care provider says  that it is safe. °This information is not intended to replace advice given to you by your health care provider. Make sure you discuss any questions you have with your health care provider. °Document Released: 05/14/2010 Document Revised: 05/17/2017 Document Reviewed: 05/17/2017 °Elsevier Patient Education © 2020 [employer] ° °

## 2018-11-22 ENCOUNTER — Telehealth: Payer: Self-pay | Admitting: Cardiovascular Disease

## 2018-11-22 NOTE — Telephone Encounter (Signed)
Patient calling Patient had a cath procedure recently Would like to know when it will be ok to take bandage off Please call to discuss

## 2018-11-22 NOTE — Telephone Encounter (Signed)
Spoke with patient and advised her it is ok to remove bandage. She asked if she could weed-eat yet and advised her no lifting over 10 lb until her follow up on 11/30/18. Then to address it with provider at that time. She was appreciative.

## 2018-11-27 NOTE — Progress Notes (Signed)
Cardiology Office Note    Date:  11/30/2018   ID:  ARIES KASA, DOB 1940/11/11, MRN [id number]  PCP:  Guadalupe Maple, MD  Cardiologist:  Kathlyn Sacramento, MD  Electrophysiologist:  None   Chief Complaint: Follow up  History of Present Illness:   Bailey Ayala is a 78 y.o. female with history of CAD medically managed as below, PAD with intermittent claudication with prior ABI from 2019 showing ABIs of 0.93 on the right (prior 0.57) and 0.64 on the left (prior 0.49) with prior Duplex from 2017 showing occlusion of the proximal to mid left SFA and borderline significant right SFA disease with prior rash noted with Pletal, infrarenal saccular AAA measuring 3.5 x 3.6 cm in 01/2018 CKD stage III, prior tobacco abuse, HTN, HLD, COPD, smoldering multiple myeloma followed by oncology not requiring treatment at this time, and hypothyroidism who presents for follow up of diagnostic LHC.   Prior nuclear stress test in 10/2015 for exertional dyspnea showed no evidence of ischemia with a normal EF. Prior echo in 01/2018 showed normal LVSF with an EF of 55-60%, normal wall motion, Gr1DD, mild MR, RVSF normal, mild to moderate TR, mildly elevated PASP.   She was seen on 11/01/2018 for preoperative cardiac evaluation for right hip surgery and was doing reasonably well, though was bothered significantly by hip pain. She reported exertional dyspnea and occasional chest discomfort. In this setting, she underwent Lexiscan Myoview on 11/06/2018 that was abnormal and potentially high risk with a moderate in size, severe, reversible defect involving the mid anteroseptal, apical septal, apical anterior, and apical segments consistent with ischemia. There was a small in size, mild in severity, reversible basal and mid inferolateral defect most likely representing ischemia though could not rule out artifact. EF 43% with apical WMA. Attenuation corrected images demonstrates multivessel coronary calcification and aortic  atherosclerosis. Horizontal ST segment depression of 1.5 mm was noted during stress in leads II, III, aVF, V3-V6. In this setting, she underwent diagnostic LHC on 11/19/2018 that showed severe 1-vessel CAD with chronically occluded mid LAD with left-to-left collaterals. Moderate RCA disease and mild LCx disease. The coronary arteries were moderately to severely calcified. LVEF 45-50% with anteroapical HK, mildly elevated LVEDP. Aggressive medical therapy was advised. She was started on metoprolol for antianginal relief with reservation of revascularization for refractory symptoms despite optimal medical therapy. She was felt to be moderate risk for upcoming noncardiac surgery. Please see below for LHC details.  She was seen in the ED on 8/5 for left-sided facial swelling along with some paresthesias in her left fingertips.  CT head and MRI brain were nonacute.  EKG appears to show sinus rhythm with significant underlying artifact making interpretation difficult.  Labs showed potassium at goal with a stable creatinine of 1.49, glucose 101, normal liver function, stable anemia with a hemoglobin of 9.2.  Given metoprolol was recently started as outlined above the ED felt there was some concern that this medication may have been playing a role and it was recommended to be discontinued.  Patient comes in doing well from a cardiac perspective.  Since undergoing her cardiac cath she denies any further dyspnea on exertion or chest discomfort.  She did not stop metoprolol as was discussed in the ED.  She has not had any further episodes of possible left-sided facial swelling, facial droop, or paresthesias.  Patient now thinks all of the above was in the setting of anxiety.  She does note some palpitations which typically occur  in the afternoon to evening hours while she is sitting at her computer.  There are no associated symptoms with these palpitations.  She denies any dizziness, presyncope, syncope, lower extremity,  abdominal tension, orthopnea, PND, early satiety.  No falls since she was last seen.  No BRBPR or melena.  She continues to have a limited functional status secondary to significant hip pain and knee pain.  However, the hip pain is more severe and seems to be getting worse.  She is looking forward to having surgery on this.  Labs: 10/2018 - WBC 5.3, HGB 9.2, PLT 214, K+ 5.0, SCr 1.28, AST/ALT normal 04/2018 - TSH normal 10/2016 - TC 160, TG 262, HDL 52, LDL 64  Past Medical History:  Diagnosis Date   Arthritis    Blood dyscrasia    BLEEDS EASILY    COPD (chronic obstructive pulmonary disease) (HCC)    Diabetes mellitus without complication (HCC)    Diffuse cystic mastopathy    Hyperlipidemia    Hypertension    Hypothyroidism    L4-L5 disc bulge    Multiple myeloma (HCC)    Osteoporosis, post-menopausal    PAD (peripheral artery disease) (HCC)    Renal insufficiency    Shortness of breath dyspnea    WITH EXERTION    Stroke ([place])    TIA     2016     WAS FOUND ON SCAN ORDERED FROM [facility]UR[facility]   Thyroid disease    TIA (transient ischemic attack) 2016   left hand weakness    Past Surgical History:  Procedure Laterality Date   ABDOMINAL HYSTERECTOMY  1990   BACK SURGERY  2011   BREAST EXCISIONAL BIOPSY Left    2010?    CATARACT EXTRACTION, BILATERAL     COLONOSCOPY  1998   EYE SURGERY Bilateral 05/06/14   JOINT REPLACEMENT     LT KNEE   KNEE SURGERY Left 2012   LEFT HEART CATH AND CORONARY ANGIOGRAPHY Left 11/19/2018   Procedure: LEFT HEART CATH AND CORONARY ANGIOGRAPHY;  Surgeon: Wellington Hampshire, MD;  Location: [facility];  Service: Cardiovascular;  Laterality: Left;   OOPHORECTOMY  1990   parathyroid transplant     2/3 THYROID REMOVED   (GOITER)   SPINE SURGERY     THYROID SURGERY     TONSILLECTOMY     TOTAL SHOULDER ARTHROPLASTY Left 01/14/2016   Procedure: LEFT TOTAL SHOULDER ARTHROPLASTY;  Surgeon: Justice Britain, MD;  Location: [facility];  Service: Orthopedics;  Laterality: Left;   TOTAL SHOULDER ARTHROPLASTY Right 11/03/2016   Procedure: RIGHT TOTAL SHOULDER ARTHROPLASTY;  Surgeon: Justice Britain, MD;  Location: [facility];  Service: Orthopedics;  Laterality: Right;   ULNAR NERVE REPAIR     LEFT   vein closure procedure Left 2010    Current Medications: Current Meds  Medication Sig   aspirin EC 81 MG tablet Take 1 tablet (81 mg total) by mouth daily.   atorvastatin (LIPITOR) 20 MG tablet Take 1 tablet (20 mg total) by mouth daily.   benazepril (LOTENSIN) 20 MG tablet Take 1 tablet (20 mg total) by mouth daily.   ferrous sulfate 325 (65 FE) MG tablet Take 325 mg by mouth daily with breakfast.   levothyroxine (SYNTHROID, LEVOTHROID) 100 MCG tablet Take 1 tablet (100 mcg total) by mouth daily.   metoprolol tartrate (LOPRESSOR) 25 MG tablet Take 1 tablet (25 mg total) by mouth 2 (two) times daily.   Multiple Vitamin (MULTIVITAMIN WITH MINERALS) TABS tablet Take 1  tablet by mouth daily.   triamcinolone cream (KENALOG) 0.1 % Apply 1 application topically 2 (two) times daily. (Patient taking differently: Apply 1 application topically 2 (two) times daily as needed (rash/skin irritation). )    Allergies:   Codeine and Simvastatin   Social History   Socioeconomic History   Marital status: Widowed    Spouse name: Not on file   Number of children: Not on file   Years of education: Not on file   Highest education level: High school graduate  Occupational History   Not on file  Social Needs   Financial resource strain: Not hard at all   Food insecurity    Worry: Never true    Inability: Never true   Transportation needs    Medical: No    Non-medical: No  Tobacco Use   Smoking status: Former Smoker    Packs/day: 3.00    Years: 25.00    Pack years: 75.00    Quit date: 11/03/1989    Years since quitting: 29.0   Smokeless tobacco: Never Used  Substance and Sexual Activity   Alcohol use: Not Currently     Alcohol/week: 0.0 standard drinks   Drug use: No   Sexual activity: Not on file  Lifestyle   Physical activity    Days per week: 0 days    Minutes per session: 0 min   Stress: Not at all  Relationships   Social connections    Talks on phone: Twice a week    Gets together: Twice a week    Attends religious service: Never    Active member of club or organization: No    Attends meetings of clubs or organizations: Never    Relationship status: Widowed  Other Topics Concern   Not on file  Social History Narrative   Not on file     Family History:  The patient's family history includes Arthritis in her mother; Diabetes in her sister; Heart disease in her father and mother; Hyperlipidemia in her brother, father, and mother; Stroke in her mother.  ROS:   Review of Systems  Constitutional: Positive for malaise/fatigue. Negative for chills, diaphoresis, fever and weight loss.  HENT: Negative for congestion.   Eyes: Negative for discharge and redness.  Respiratory: Negative for cough, hemoptysis, sputum production, shortness of breath and wheezing.   Cardiovascular: Positive for palpitations. Negative for chest pain, orthopnea, claudication, leg swelling and PND.  Gastrointestinal: Negative for abdominal pain, blood in stool, heartburn, melena, nausea and vomiting.  Genitourinary: Negative for hematuria.  Musculoskeletal: Positive for joint pain. Negative for falls and myalgias.       Hip and knee pain  Skin: Negative for rash.  Neurological: Negative for dizziness, tingling, tremors, sensory change, speech change, focal weakness, loss of consciousness and weakness.  Endo/Heme/Allergies: Does not bruise/bleed easily.  Psychiatric/Behavioral: Negative for substance abuse. The patient is not nervous/anxious.   All other systems reviewed and are negative.    EKGs/Labs/Other Studies Reviewed:    Studies reviewed were summarized above. The additional studies were reviewed  today:  LHC 11/19/2018:  Mid LAD-1 lesion is 80% stenosed.  Mid LAD-2 lesion is 100% stenosed.  There is mild left ventricular systolic dysfunction.  LV end diastolic pressure is mildly elevated.  The left ventricular ejection fraction is 45-50% by visual estimate.  Mid RCA lesion is 50% stenosed.  Prox RCA lesion is 30% stenosed.   1.  Severe one-vessel coronary artery disease with chronically occluded mid LAD with left  to left collaterals.  Moderate RCA disease and mild left circumflex disease.  The coronary arteries are moderately to severely calcified. 2.  Mildly reduced LV systolic function with an EF of 45 to 50% with anteroapical hypokinesis. 3.  Mildly elevated left ventricular end-diastolic pressure.  Recommendations: Recommend aggressive medical therapy. I added metoprolol for antianginal relief.  Reserve revascularization for refractory symptoms. The patient is likely at moderate risk for hip replacement.   EKG:  EKG is ordered today.  The EKG ordered today demonstrates NSR, 70 bpm, incomplete RBBB with rhythm strip demonstrating sinus rhythm  Recent Labs: 05/10/2018: TSH 0.514 11/28/2018: ALT 16; BUN 29; Creatinine, Ser 1.49; Hemoglobin 9.2; Platelets 197; Potassium 4.9; Sodium 137  Recent Lipid Panel    Component Value Date/Time   CHOL 157 12/28/2017 1451   TRIG 230 (H) 12/28/2017 1451   HDL 44 11/02/2016 1449   CHOLHDL 3.6 11/02/2016 1449   VLDL 46 (H) 12/28/2017 1451   LDLCALC 64 11/02/2016 1449    PHYSICAL EXAM:    VS:  BP (!) 120/58 (BP Location: Left Arm, Patient Position: Sitting, Cuff Size: Normal)    Pulse 67    Ht _0  (1.651 m)    Wt 170 lb (77.1 kg)    SpO2 98%    BMI 28.29 kg/m   BMI: Body mass index is 28.29 kg/m.  Physical Exam  Constitutional: She is oriented to person, place, and time. She appears well-developed and well-nourished.  HENT:  Head: Normocephalic and atraumatic.  Eyes: Right eye exhibits no discharge. Left eye exhibits no  discharge.  Neck: Normal range of motion. No JVD present.  Cardiovascular: Normal rate, regular rhythm, S1 normal, S2 normal and normal heart sounds. Exam reveals no distant heart sounds, no friction rub, no midsystolic click and no opening snap.  No murmur heard. Pulses:      Posterior tibial pulses are 2+ on the right side and 2+ on the left side.  Right radial cath site is well-healing without any bleeding, bruising, swelling, warmth, erythema, or tenderness to palpation.  Radial pulse 2+.  Pulmonary/Chest: Effort normal and breath sounds normal. No respiratory distress. She has no decreased breath sounds. She has no wheezes. She has no rales. She exhibits no tenderness.  Abdominal: Soft. She exhibits no distension. There is no abdominal tenderness.  Musculoskeletal:        General: No edema.     Comments: 5 out of 5 strength along the bilateral upper and lower extremities.  Neurological: She is alert and oriented to person, place, and time.  Skin: Skin is warm and dry. No cyanosis. Nails show no clubbing.  Psychiatric: She has a normal mood and affect. Her speech is normal and behavior is normal. Judgment and thought content normal.    Wt Readings from Last 3 Encounters:  11/30/18 170 lb (77.1 kg)  11/28/18 170 lb (77.1 kg)  11/28/18 170 lb (77.1 kg)     ASSESSMENT & PLAN:   1. CAD involving native coronary arteries without angina: She is doing well without any recurrent chest discomfort or exertional dyspnea.  Recent diagnostic cath demonstrated severe one-vessel CAD with occluded mid LAD with left to left collaterals with medical management recommended as above.  Continue metoprolol for antianginal effect as well as in the setting of palpitations as outlined below.  She will also remain on aspirin and Lipitor.  We will reserve potential PCI of the chronically occluded mid LAD for refractory symptoms despite optimization of medical therapy.  No plans for further ischemic evaluation at  this time.  2. Palpitations: Seem to be more of a recent issue and are typically noted in the afternoon hours while she is sitting at her computer.  There are no associated symptoms.  Recent potassium and TSH unrevealing.  Place ZIO monitor.  Continue metoprolol.  3. PAD: Known moderate claudication, worse on the left side.  Symptoms overall stable.  Did not tolerate Pletal.  Continue current medical therapy and follow-up with Dr. Fletcher Anon as directed.  4. Paresthesia: Seen in ED on 8/5 with left-sided facial paresthesia.  Imaging nonacute.  EKG showed sinus rhythm.  Labs unrevealing.  The ED felt her symptoms may have possibly been related to recently started metoprolol leading to its discontinuation.  However, patient feels like this may have been more of an anxiety related episode.  In this setting she has continued metoprolol without any further adverse event.  There are no focal deficits noted on physical exam today.  We will obtain ZIO monitor to evaluate for significant arrhythmia as outlined above.  5. Hyperlipidemia: LDL of 64 from 10/2016.  Goal LDL less than 70.  Remains on Lipitor 20 mg daily.  Check lipid panel.  6. Hypertension: Blood pressure is well controlled today.  Continue current medications as outlined above.  7. Small abdominal aortic aneurysm: Due for follow-up study in early to mid 2021.  Optimal blood pressure and lipid control recommended.  Recommend patient avoid fluoroquinolones.  Education provided regarding aneurysm.  It appears the patient has had multiple siblings diagnosed with abdominal aneurysms as well.  I have recommended she discuss this with HER-2 daughters and have them undergo screening ultrasounds with her PCP.  8. Preoperative cardiac evaluation.  Patient is planning to undergo hip replacement.  Recent diagnostic cardiac cath demonstrated severe one-vessel CAD affecting the LAD with left to left collaterals as outlined above.  She is doing well without any  symptoms concerning for continued angina.  Overall, she has at least moderate risk for noncardiac surgery.  No further testing or intervention will reduce this risk at this time.  I recommend aspirin be continued throughout the perioperative timeframe if possible.  Disposition: F/u with Dr. Fletcher Anon in 5 months.   Medication Adjustments/Labs and Tests Ordered: Current medicines are reviewed at length with the patient today.  Concerns regarding medicines are outlined above. Medication changes, Labs and Tests ordered today are summarized above and listed in the Patient Instructions accessible in Encounters.   Signed, Christell Faith, PA-C 11/30/2018 9:13 AM     [facility] [address] [place] [address] [place], [place] [address] [phone]

## 2018-11-28 ENCOUNTER — Emergency Department: Payer: Medicare Other

## 2018-11-28 ENCOUNTER — Encounter: Payer: Self-pay | Admitting: Emergency Medicine

## 2018-11-28 ENCOUNTER — Emergency Department
Admission: EM | Admit: 2018-11-28 | Discharge: 2018-11-28 | Disposition: A | Discharge status: Home / Self Care | Payer: Medicare Other | Attending: Emergency Medicine | Admitting: Emergency Medicine

## 2018-11-28 ENCOUNTER — Ambulatory Visit: Payer: Medicare Other

## 2018-11-28 ENCOUNTER — Ambulatory Visit (INDEPENDENT_AMBULATORY_CARE_PROVIDER_SITE_OTHER): Payer: Medicare Other | Admitting: Family Medicine

## 2018-11-28 ENCOUNTER — Encounter: Payer: Self-pay | Admitting: Family Medicine

## 2018-11-28 ENCOUNTER — Other Ambulatory Visit: Payer: Self-pay

## 2018-11-28 VITALS — BP 127/79 | HR 64 | Temp 97.8°F | Ht 65.0 in | Wt 170.0 lb

## 2018-11-28 DIAGNOSIS — E119 Type 2 diabetes mellitus without complications: Secondary | ICD-10-CM | POA: Insufficient documentation

## 2018-11-28 DIAGNOSIS — Z8673 Personal history of transient ischemic attack (TIA), and cerebral infarction without residual deficits: Secondary | ICD-10-CM | POA: Diagnosis not present

## 2018-11-28 DIAGNOSIS — I10 Essential (primary) hypertension: Secondary | ICD-10-CM | POA: Diagnosis not present

## 2018-11-28 DIAGNOSIS — E039 Hypothyroidism, unspecified: Secondary | ICD-10-CM | POA: Insufficient documentation

## 2018-11-28 DIAGNOSIS — R202 Paresthesia of skin: Secondary | ICD-10-CM | POA: Insufficient documentation

## 2018-11-28 DIAGNOSIS — Z87891 Personal history of nicotine dependence: Secondary | ICD-10-CM | POA: Diagnosis not present

## 2018-11-28 DIAGNOSIS — I25118 Atherosclerotic heart disease of native coronary artery with other forms of angina pectoris: Secondary | ICD-10-CM

## 2018-11-28 DIAGNOSIS — E1140 Type 2 diabetes mellitus with diabetic neuropathy, unspecified: Secondary | ICD-10-CM | POA: Diagnosis not present

## 2018-11-28 DIAGNOSIS — R220 Localized swelling, mass and lump, head: Secondary | ICD-10-CM | POA: Diagnosis not present

## 2018-11-28 DIAGNOSIS — I2510 Atherosclerotic heart disease of native coronary artery without angina pectoris: Secondary | ICD-10-CM | POA: Insufficient documentation

## 2018-11-28 DIAGNOSIS — R29810 Facial weakness: Secondary | ICD-10-CM | POA: Insufficient documentation

## 2018-11-28 DIAGNOSIS — I779 Disorder of arteries and arterioles, unspecified: Secondary | ICD-10-CM

## 2018-11-28 LAB — CBC
HCT: 28.8 % — ABNORMAL LOW (ref 36.0–46.0)
Hemoglobin: 9.2 g/dL — ABNORMAL LOW (ref 12.0–15.0)
MCH: 34.7 pg — ABNORMAL HIGH (ref 26.0–34.0)
MCHC: 31.9 g/dL (ref 30.0–36.0)
MCV: 108.7 fL — ABNORMAL HIGH (ref 80.0–100.0)
Platelets: 197 10*3/uL (ref 150–400)
RBC: 2.65 MIL/uL — ABNORMAL LOW (ref 3.87–5.11)
RDW: 13.3 % (ref 11.5–15.5)
WBC: 6.3 10*3/uL (ref 4.0–10.5)
nRBC: 0 % (ref 0.0–0.2)

## 2018-11-28 LAB — COMPREHENSIVE METABOLIC PANEL
ALT: 16 U/L (ref 0–44)
AST: 20 U/L (ref 15–41)
Albumin: 4.1 g/dL (ref 3.5–5.0)
Alkaline Phosphatase: 74 U/L (ref 38–126)
Anion gap: 9 (ref 5–15)
BUN: 29 mg/dL — ABNORMAL HIGH (ref 8–23)
CO2: 20 mmol/L — ABNORMAL LOW (ref 22–32)
Calcium: 9.2 mg/dL (ref 8.9–10.3)
Chloride: 108 mmol/L (ref 98–111)
Creatinine, Ser: 1.49 mg/dL — ABNORMAL HIGH (ref 0.44–1.00)
GFR calc Af Amer: 39 mL/min — ABNORMAL LOW (ref >60)
GFR calc non Af Amer: 34 mL/min — ABNORMAL LOW (ref >60)
Glucose, Bld: 101 mg/dL — ABNORMAL HIGH (ref 70–99)
Potassium: 4.9 mmol/L (ref 3.5–5.1)
Sodium: 137 mmol/L (ref 135–145)
Total Bilirubin: 0.5 mg/dL (ref 0.3–1.2)
Total Protein: 7.9 g/dL (ref 6.5–8.1)

## 2018-11-28 LAB — PROTIME-INR
INR: 1 (ref 0.8–1.2)
Prothrombin Time: 13.1 seconds (ref 11.4–15.2)

## 2018-11-28 LAB — DIFFERENTIAL
Abs Immature Granulocytes: 0.02 10*3/uL (ref 0.00–0.07)
Basophils Absolute: 0 10*3/uL (ref 0.0–0.1)
Basophils Relative: 0 %
Eosinophils Absolute: 0.2 10*3/uL (ref 0.0–0.5)
Eosinophils Relative: 3 %
Immature Granulocytes: 0 %
Lymphocytes Relative: 23 %
Lymphs Abs: 1.5 10*3/uL (ref 0.7–4.0)
Monocytes Absolute: 0.4 10*3/uL (ref 0.1–1.0)
Monocytes Relative: 7 %
Neutro Abs: 4.2 10*3/uL (ref 1.7–7.7)
Neutrophils Relative %: 67 %

## 2018-11-28 LAB — GLUCOSE, CAPILLARY: Glucose-Capillary: 78 mg/dL (ref 70–99)

## 2018-11-28 LAB — APTT: aPTT: 28 seconds (ref 24–36)

## 2018-11-28 MED ORDER — SODIUM CHLORIDE 0.9% FLUSH: 3.0000 mL | Freq: Once | INTRAVENOUS | Status: DC

## 2018-11-28 NOTE — Progress Notes (Signed)
BP 127/79 (BP Location: Left Arm, Patient Position: Sitting, Cuff Size: Normal)   Pulse 64   Temp 97.8 F (36.6 C) (Oral)   Ht '5\' 5"'  (1.651 m)   Wt 170 lb (77.1 kg)   SpO2 99%   BMI 28.29 kg/m    Subjective:    Patient ID: Bailey Ayala, female    DOB: 09/11/1940, 78 y.o.   MRN: [id number]  HPI: Bailey Ayala is a 78 y.o. female  Chief Complaint  Patient presents with  . Facial Swelling    Patient wondering if she didn't have a stroke or not. Woke up this morning with left side of face swollen.  . Gastroesophageal Reflux    Patient stated she thought she had acid reflux last night but is unsure.   Patient with complicated pmhx including CAD, Hx of CVA, PAD, CKD, COPD, DM, and Multiple Myeloma presents today with concern for a stroke after waking up this morning with a swollen, numb feeling on the left side of her face. She started noticing the corner of her mouth drooping shortly after. She denies aphasia, confusion, visual changes, hearing changes, extremity weakness or numbness, headache, dental or sinus pain, fevers, recent illness. Does have a hx of CVA found on MRI in 2018 after about 6 months of neurologic sxs.   Also having what she thinks may be heartburn at night and intermittently throughout the day. Sometimes takes TUMs which rarely helps. Was told recently by Cardiology that this is angina and not heartburn, and is s/p cardiac catheterization a week ago showing severe multivessel disease. Has f/u with her Cardiologist in 2 days and was started on metoprolol to help with anginal sxs she's been having. Has not noticed any issues there. Denies SOB, diaphoresis coming along with these episodes.   Relevant past medical, surgical, family and social history reviewed and updated as indicated. Interim medical history since our last visit reviewed. Allergies and medications reviewed and updated.  Review of Systems  Per HPI unless specifically indicated above      Objective:    BP 127/79 (BP Location: Left Arm, Patient Position: Sitting, Cuff Size: Normal)   Pulse 64   Temp 97.8 F (36.6 C) (Oral)   Ht '5\' 5"'  (1.651 m)   Wt 170 lb (77.1 kg)   SpO2 99%   BMI 28.29 kg/m   Wt Readings from Last 3 Encounters:  11/28/18 170 lb (77.1 kg)  11/28/18 170 lb (77.1 kg)  11/19/18 170 lb (77.1 kg)    Physical Exam Vitals signs and nursing note reviewed.  Constitutional:      Appearance: Normal appearance. She is not ill-appearing.  HENT:     Head: Atraumatic.  Eyes:     Extraocular Movements: Extraocular movements intact.     Conjunctiva/sclera: Conjunctivae normal.  Neck:     Musculoskeletal: Normal range of motion and neck supple.  Cardiovascular:     Rate and Rhythm: Normal rate and regular rhythm.     Heart sounds: Normal heart sounds.  Pulmonary:     Effort: Pulmonary effort is normal.     Breath sounds: Normal breath sounds.  Musculoskeletal: Normal range of motion.  Skin:    General: Skin is warm and dry.  Neurological:     Mental Status: She is alert and oriented to person, place, and time.     Comments: Mild left sided facial droop, with decreased sensation to fine touch, but smile largely symmetric Eyebrows raise symmetrically, tongue extends at  midline.  No other focal neurologic deficits noted on exam  Psychiatric:        Mood and Affect: Mood normal.        Thought Content: Thought content normal.        Judgment: Judgment normal.     Results for orders placed or performed during the hospital encounter of 11/15/18  SARS Coronavirus 2 (Performed in [place] hospital lab)   Specimen: Nasal Swab  Result Value Ref Range   SARS Coronavirus 2 NEGATIVE NEGATIVE      Assessment & Plan:   Problem List Items Addressed This Visit      Cardiovascular and Mediastinum   CAD (coronary artery disease)    S/p cardiac catheterization about a week ago showing severe multivessel stenosis, following up with Cardiology in 2 days.  Declines further cardiac testing today in clinic including EKG. Will send to ER for neurologic concerns going on and she is agreeable to any workup they may feel necessary for this issue.         Other   History of CVA (cerebrovascular accident)    Found on Brain MRI in 2018      Relevant Orders   MR Brain Wo Contrast   Facial droop - Primary    Started this morning upon waking per patient. She initially declined [place] calling 911 or having her daughter drive her to the ER after multiple attempts to make this recommendation and facilitate her transportation there for further immediate workup. Stat MRI brain was ordered per her request but soonest available appointment was not until 8 pm this evening. Re-discussed situation with patient about a differential dx at this time and discussed the imperative nature of timing with sxs such as these and urged her to go to the ER. She is agreeable ONLY if her daughter can drive her there instead of taking an ambulance. [facility] ER called and given information on patient and told to expect her arrival shortly.       Relevant Orders   MR Brain Wo Contrast     40 minutes spent today in direct care and coordination of services for patient  Follow up plan: Return for ER f/u.

## 2018-11-28 NOTE — ED Notes (Signed)
Blood drawn by lab

## 2018-11-28 NOTE — ED Notes (Signed)
Lab called to collect blood d/t CT not arrived yet

## 2018-11-28 NOTE — ED Notes (Signed)
First nurse note: ODQ 5500 last night. Pt states she went to bed with no complaints last night, woke up in the night with some heart burn, this am, woke up with right side of face "swollen" and right facial droop, resolving now per pt. Nad. Speech clear, HOH.

## 2018-11-28 NOTE — ED Triage Notes (Addendum)
Pt went to bed feeling normal last night.  Pt woke up this morning feeling like left face was swollen and pulling at left eye.  Pt went PCP and sent here for left facial droop and left facial numbness. Numbness now improving but still mildly present.  Family reports mild left facial droop still present.  Facial droop faint and difficult to notice. When touched face no difference in sensation.  No weakness.  Pt initially denied any other numbness, tingling, or symptoms but then when done with triage reported left hand numbness.  Hx CVA/TIA. VAN negative.

## 2018-11-28 NOTE — Assessment & Plan Note (Signed)
Found on Brain MRI in 2018

## 2018-11-28 NOTE — ED Notes (Signed)
Pt daughter ambulated pt to the hall bathroom stating that she would not use the bathroom in the room - daughter refused assistance from this nurse at this time Pt ambulates without assistance with steady gait

## 2018-11-28 NOTE — ED Provider Notes (Signed)
[facility] Emergency Department Provider Note       Time seen: ----------------------------------------- 12:03 PM on 11/28/2018 -----------------------------------------   I have reviewed the triage vital signs and the nursing notes.  HISTORY   Chief Complaint Facial Droop   HPI Bailey Ayala is a 78 y.o. female with a history of arthritis, diabetes, hyperlipidemia, hypertension, multiple myeloma, renal insufficiency, TIA, CVA who presents to the ED for facial swelling and pulling at her left eye that she noted upon awakening this morning.  She went to her primary care doctor and was sent here for left facial droop and left facial numbness.  She describes some paresthesias in her left finger tips numbness is now improving but still mildly present according to the patient.  It does not appear to be any sensory deficit on arrival according to the triage note.  She denies any other neurologic symptoms.  Past Medical History:  Diagnosis Date  . Arthritis   . Blood dyscrasia    BLEEDS EASILY   . COPD (chronic obstructive pulmonary disease) ([place])   . Diabetes mellitus without complication ([place])   . Diffuse cystic mastopathy   . Hyperlipidemia   . Hypertension   . Hypothyroidism   . L4-L5 disc bulge   . Multiple myeloma ([place])   . Osteoporosis, post-menopausal   . PAD (peripheral artery disease) ([place])   . Renal insufficiency   . Shortness of breath dyspnea    WITH EXERTION   . Stroke ([place])    TIA     2016     WAS FOUND ON SCAN ORDERED FROM NEUROL  . Thyroid disease   . TIA (transient ischemic attack) 2016   left hand weakness    Patient Active Problem List   Diagnosis Date Noted  . History of CVA (cerebrovascular accident) 11/28/2018  . Abnormal stress test   . Pain due to onychomycosis of toenails of both feet 10/18/2018  . Osteoarthritis of knee 07/16/2018  . Chronic toe ulcer, left, limited to breakdown of skin ([place]) 06/28/2018  . Neck pain  09/05/2017  . Left lower lobe pulmonary nodule 08/18/2017  . Abdominal aortic aneurysm (AAA) without rupture ([place]) 08/18/2017  . Goals of care, counseling/discussion 08/18/2017  . Weight loss 07/01/2017  . Smoldering multiple myeloma ([place]) 06/30/2017  . Macrocytic anemia 06/30/2017  . Benign hypertensive heart and CKD, stage 3 (GFR 30-59), w CHF ([place]) 05/29/2017  . Drug-induced constipation 11/07/2016  . Primary osteoarthritis involving multiple joints 11/07/2016  . Simple chronic bronchitis ([place]) 11/07/2016  . Status post total shoulder arthroplasty 11/03/2016  . Advanced care planning/counseling discussion 11/02/2016  . History of total knee arthroplasty 08/04/2016  . Localized, primary osteoarthritis of shoulder region 08/04/2016  . S/P shoulder replacement 01/14/2016  . Hypothyroid 11/04/2014  . Hyperlipidemia   . Hypertension   . Varicose vein of leg 09/30/2014  . Peripheral arterial occlusive disease ([place]) 09/30/2014    Past Surgical History:  Procedure Laterality Date  . ABDOMINAL HYSTERECTOMY  1990  . BACK SURGERY  2011  . BREAST EXCISIONAL BIOPSY Left    2010?   Marland Kitchen CATARACT EXTRACTION, BILATERAL    . COLONOSCOPY  1998  . EYE SURGERY Bilateral 05/06/14  . JOINT REPLACEMENT     LT KNEE  . KNEE SURGERY Left 2012  . LEFT HEART CATH AND CORONARY ANGIOGRAPHY Left 11/19/2018   Procedure: LEFT HEART CATH AND CORONARY ANGIOGRAPHY;  Surgeon: Wellington Hampshire, MD;  Location: [facility];  Service: Cardiovascular;  Laterality: Left;  . OOPHORECTOMY  1990  . parathyroid transplant     2/3 THYROID REMOVED   (GOITER)  . SPINE SURGERY    . THYROID SURGERY    . TONSILLECTOMY    . TOTAL SHOULDER ARTHROPLASTY Left 01/14/2016   Procedure: LEFT TOTAL SHOULDER ARTHROPLASTY;  Surgeon: Justice Britain, MD;  Location: [facility];  Service: Orthopedics;  Laterality: Left;  . TOTAL SHOULDER ARTHROPLASTY Right 11/03/2016   Procedure: RIGHT TOTAL SHOULDER ARTHROPLASTY;  Surgeon: Justice Britain,  MD;  Location: [facility];  Service: Orthopedics;  Laterality: Right;  . ULNAR NERVE REPAIR     LEFT  . vein closure procedure Left 2010    Allergies Codeine and Simvastatin  Social History Social History   Tobacco Use  . Smoking status: Former Smoker    Packs/day: 3.00    Years: 25.00    Pack years: 75.00    Quit date: 11/03/1989    Years since quitting: 29.0  . Smokeless tobacco: Never Used  Substance Use Topics  . Alcohol use: Not Currently    Alcohol/week: 0.0 standard drinks  . Drug use: No   Review of Systems Constitutional: Negative for fever. Cardiovascular: Negative for chest pain. Respiratory: Negative for shortness of breath. Gastrointestinal: Negative for abdominal pain, vomiting and diarrhea. Musculoskeletal: Negative for back pain. Skin: Negative for rash. Neurological: Positive for facial droop and numbness  All systems negative/normal/unremarkable except as stated in the HPI  ____________________________________________   PHYSICAL EXAM:  VITAL SIGNS: ED Triage Vitals [11/28/18 1150]  Enc Vitals Group     BP (!) 142/70     Pulse Rate 65     Resp 18     Temp (!) 97.5 F (36.4 C)     Temp Source Oral     SpO2 99 %     Weight 170 lb (77.1 kg)     Height '5\' 5"'  (1.651 m)     Head Circumference      Peak Flow      Pain Score 0     Pain Loc      Pain Edu?      Excl. in [place]?     Constitutional: Alert and oriented. Well appearing and in no distress. Eyes: Conjunctivae are normal. Normal extraocular movements. ENT      Head: Normocephalic and atraumatic.      Nose: No congestion/rhinnorhea.      Mouth/Throat: Mucous membranes are moist.  Slight left lower lip swelling is noted      Neck: No stridor. Cardiovascular: Normal rate, regular rhythm. No murmurs, rubs, or gallops. Respiratory: Normal respiratory effort without tachypnea nor retractions. Breath sounds are clear and equal bilaterally. No wheezes/rales/rhonchi. Gastrointestinal: Soft and  nontender. Normal bowel sounds Musculoskeletal: Nontender with normal range of motion in extremities. No lower extremity tenderness nor edema. Neurologic:  Normal speech and language. No gross focal neurologic deficits are appreciated.  No obvious facial drooping is noted, slight paresthesias are noted on her left fingertips and less so on the left side of her face Skin:  Skin is warm, dry and intact.  Some left lower lip swelling Psychiatric: Mood and affect are normal. Speech and behavior are normal.  ____________________________________________  ED COURSE:  As part of my medical decision making, I reviewed the following data within the [facility] History obtained from family if available, nursing notes, old chart and ekg, as well as notes from prior ED visits. Patient presented for strokelike symptoms that she observe this  morning, we will assess with labs and imaging as indicated at this time.   Procedures  Bailey Ayala was evaluated in Emergency Department on 11/28/2018 for the symptoms described in the history of present illness. She was evaluated in the context of the global COVID-19 pandemic, which necessitated consideration that the patient might be at risk for infection with the SARS-CoV-2 virus that causes COVID-19. Institutional protocols and algorithms that pertain to the evaluation of patients at risk for COVID-19 are in a state of rapid change based on information released by regulatory bodies including the CDC and federal and state organizations. These policies and algorithms were followed during the patient's care in the ED.  ____________________________________________   LABS (pertinent positives/negatives)  Labs Reviewed  CBC - Abnormal; Notable for the following components:      Result Value   RBC 2.65 (*)    Hemoglobin 9.2 (*)    HCT 28.8 (*)    MCV 108.7 (*)    MCH 34.7 (*)    All other components within normal limits  COMPREHENSIVE METABOLIC  PANEL - Abnormal; Notable for the following components:   CO2 20 (*)    Glucose, Bld 101 (*)    BUN 29 (*)    Creatinine, Ser 1.49 (*)    GFR calc non Af Amer 34 (*)    GFR calc Af Amer 39 (*)    All other components within normal limits  PROTIME-INR  APTT  DIFFERENTIAL  GLUCOSE, CAPILLARY  CBG MONITORING, ED    RADIOLOGY Images were viewed by me  CT head IMPRESSION: No acute abnormality. ____________________________________________   DIFFERENTIAL DIAGNOSIS   TIA, CVA, paresthesia, anxiety, angioedema  FINAL ASSESSMENT AND PLAN  Facial swelling, paresthesias   Plan: The patient had presented for possible facial droop and paresthesias. Patient's labs revealed a chronic macrocytic anemia. Patient's imaging thus far has not revealed any acute process.  Brain MRI is pending at this time.   Laurence Aly, MD    Note: This note was generated in part or whole with voice recognition software. Voice recognition is usually quite accurate but there are transcription errors that can and very often do occur. I apologize for any typographical errors that were not detected and corrected.     Earleen Newport, MD 11/28/18 1501

## 2018-11-28 NOTE — ED Notes (Signed)
Called CT to let them know pt was ready IV obtained but would not give blood so lab to be called for blood collection

## 2018-11-28 NOTE — Assessment & Plan Note (Signed)
Started this morning upon waking per patient. She initially declined [place] calling 911 or having her daughter drive her to the ER after multiple attempts to make this recommendation and facilitate her transportation there for further immediate workup. Stat MRI brain was ordered per her request but soonest available appointment was not until 8 pm this evening. Re-discussed situation with patient about a differential dx at this time and discussed the imperative nature of timing with sxs such as these and urged her to go to the ER. She is agreeable ONLY if her daughter can drive her there instead of taking an ambulance. [facility] ER called and given information on patient and told to expect her arrival shortly.

## 2018-11-28 NOTE — ED Provider Notes (Signed)
Ct Head Wo Contrast  Result Date: 11/28/2018 CLINICAL DATA:  Onset right facial swelling and root this morning. EXAM: CT HEAD WITHOUT CONTRAST TECHNIQUE: Contiguous axial images were obtained from the base of the skull through the vertex without intravenous contrast. COMPARISON:  Brain MRI 06/08/2015. FINDINGS: Brain: No evidence of acute infarction, hemorrhage, hydrocephalus, extra-axial collection or mass lesion/mass effect. Vascular: No hyperdense vessel or unexpected calcification. Skull: Intact.  No focal lesion. Sinuses/Orbits: Status post cataract surgery.  Otherwise negative. Other: None. IMPRESSION: No acute abnormality. Electronically Signed   By: Inge Rise M.D.   On: 11/28/2018 13:48   Mr Brain Wo Contrast  Result Date: 11/28/2018 CLINICAL DATA:  Left facial droop and facial numbness. EXAM: MRI HEAD WITHOUT CONTRAST TECHNIQUE: Multiplanar, multiecho pulse sequences of the brain and surrounding structures were obtained without intravenous contrast. COMPARISON:  Head CT same day.  MRI 06/08/2015. FINDINGS: Brain: Diffusion imaging does not show any acute or subacute infarction. Brainstem is normal. No focal cerebellar finding. Cerebral hemispheres show an old infarction in or adjacent to the right caudate body and mild to moderate chronic small-vessel ischemic changes elsewhere within the cerebral hemispheric white matter. No large vessel territory infarction. Compared to the study of 2017, the white matter changes are minimally progressive. No hemorrhage, hydrocephalus or extra-axial collection. Vascular: Major vessels at the base of the brain show flow. Skull and upper cervical spine: Negative Sinuses/Orbits: Clear/normal Other: None IMPRESSION: No acute finding. Moderate chronic small-vessel ischemic changes of the cerebral hemispheres, minimally progressive since 2017. No specific cause of the clinical presentation is identified. Electronically Signed   By: Nelson Chimes M.D.   On: 11/28/2018  16:11     MRI negative for acute.  Patient not having any ongoing weakness or swelling in the face.  Discussed and she will stop her metoprolol and notify cardiology a potential side effect, also discussed careful return precautions and reviewed her medication list and also noted she takes Benzapril but reports she is taken this for years the metoprolol being new seems to be more likely an offending agent if this is a drug related.  Patient and daughter both comfortable with plan for discharge.  Patient resting comfortably.  No evidence of facial intraoral or other swelling noted at this time.  Does wear dentures, did not wish to take those out for examination of the palate.  Return precautions and treatment recommendations and follow-up discussed with the patient who is agreeable with the plan.    Delman Kitten, MD 11/28/18 [id number]

## 2018-11-28 NOTE — ED Notes (Signed)
Patient transported to CT 

## 2018-11-28 NOTE — ED Notes (Signed)
Patient transported to MRI 

## 2018-11-28 NOTE — Assessment & Plan Note (Signed)
S/p cardiac catheterization about a week ago showing severe multivessel stenosis, following up with Cardiology in 2 days. Declines further cardiac testing today in clinic including EKG. Will send to ER for neurologic concerns going on and she is agreeable to any workup they may feel necessary for this issue.

## 2018-11-28 NOTE — Discharge Instructions (Addendum)
Stop metoprolol, as this new medication could have contributed to your symptoms today.  Please discuss this with cardiology on Friday at your appointment  Follow-up closely.  Return to the ER right away if you develop confusion, trouble speaking, weakness in the face arms or legs, worsening symptoms or new concerns arise.

## 2018-11-30 ENCOUNTER — Ambulatory Visit (INDEPENDENT_AMBULATORY_CARE_PROVIDER_SITE_OTHER): Payer: Medicare Other

## 2018-11-30 ENCOUNTER — Encounter: Payer: Self-pay | Admitting: Physician Assistant

## 2018-11-30 ENCOUNTER — Other Ambulatory Visit: Payer: Self-pay

## 2018-11-30 ENCOUNTER — Ambulatory Visit (INDEPENDENT_AMBULATORY_CARE_PROVIDER_SITE_OTHER): Payer: Medicare Other | Admitting: Physician Assistant

## 2018-11-30 VITALS — BP 120/58 | HR 67 | Ht 65.0 in | Wt 170.0 lb

## 2018-11-30 DIAGNOSIS — I10 Essential (primary) hypertension: Secondary | ICD-10-CM

## 2018-11-30 DIAGNOSIS — R202 Paresthesia of skin: Secondary | ICD-10-CM

## 2018-11-30 DIAGNOSIS — E785 Hyperlipidemia, unspecified: Secondary | ICD-10-CM | POA: Diagnosis not present

## 2018-11-30 DIAGNOSIS — I779 Disorder of arteries and arterioles, unspecified: Secondary | ICD-10-CM

## 2018-11-30 DIAGNOSIS — E782 Mixed hyperlipidemia: Secondary | ICD-10-CM

## 2018-11-30 DIAGNOSIS — I714 Abdominal aortic aneurysm, without rupture, unspecified: Secondary | ICD-10-CM

## 2018-11-30 DIAGNOSIS — R002 Palpitations: Secondary | ICD-10-CM | POA: Diagnosis not present

## 2018-11-30 DIAGNOSIS — Z01810 Encounter for preprocedural cardiovascular examination: Secondary | ICD-10-CM | POA: Diagnosis not present

## 2018-11-30 DIAGNOSIS — I2510 Atherosclerotic heart disease of native coronary artery without angina pectoris: Secondary | ICD-10-CM | POA: Diagnosis not present

## 2018-11-30 DIAGNOSIS — I739 Peripheral vascular disease, unspecified: Secondary | ICD-10-CM

## 2018-11-30 NOTE — Patient Instructions (Signed)
Medication Instructions:  - Your physician recommends that you continue on your current medications as directed. Please refer to the Current Medication list given to you today.  If you need a refill on your cardiac medications before your next appointment, please call your pharmacy.   Lab work: - Your physician recommends that you have lab work today: lipid  If you have labs (blood work) drawn today and your tests are completely normal, you will receive your results only by: Marland Kitchen MyChart Message (if you have MyChart) OR . A paper copy in the mail If you have any lab test that is abnormal or we need to change your treatment, we will call you to review the results.  Testing/Procedures: - Your physician has recommended that you wear a 14 day heart monitor (ZIO patch) - This will be mailed directly to your home address/ placed in office today - You may receive a call directly from the company for [place], which is [employer] within the next few day- if you see an 800# or 224# calling, please answer - Once the monitor is applied and activated it will record your heart rate/ rhythm for the duration that you are wearing it - If you are having any symptoms (dizziness/ lightheadedness/ palpitations/ racing heart/ anything that just doesn't feel right) then you will push the button in the center of the monitor to mark you were having a symptom - Please DO NOT shower for 24 hours after the monitor is placed - No tub baths/ swimming pools/ hot tubs while wearing the monitor - Do not put any lotions, oils, or ointments around the monitor - If you have any issues with the monitor itself, then please call the 800 # for the company - After 14 days please take the monitor off at home and mail it back ([place] mail) in the box provided by [employer], postage is already paid.   Follow-Up: At [facility], you and your health needs are our priority.  As part of our continuing mission to provide you with exceptional heart  care, we have created designated Provider Care Teams.  These Care Teams include your primary Cardiologist (physician) and Advanced Practice Providers (APPs -  Physician Assistants and Nurse Practitioners) who all work together to provide you with the care you need, when you need it. You will need a follow up appointment in 5 months (January 2021). Please call our office 2 months in advance to schedule this appointment. (Call in early November to schedule)  You may see Kathlyn Sacramento, MD or one of the following Advanced Practice Providers on your designated Care Team:   Murray Hodgkins, NP Christell Faith, PA-C . Marrianne Mood, PA-C  Any Other Special Instructions Will Be Listed Below (If Applicable). - N/A

## 2018-12-01 LAB — LIPID PANEL
Chol/HDL Ratio: 3.8 ratio (ref 0.0–4.4)
Cholesterol, Total: 152 mg/dL (ref 100–199)
HDL: 40 mg/dL (ref >39)
LDL Calculated: 79 mg/dL (ref 0–99)
Triglycerides: 167 mg/dL — ABNORMAL HIGH (ref 0–149)
VLDL Cholesterol Cal: 33 mg/dL (ref 5–40)

## 2018-12-03 ENCOUNTER — Telehealth: Payer: Self-pay | Admitting: Cardiovascular Disease

## 2018-12-03 NOTE — Telephone Encounter (Signed)
Patient would like to know what moderate cardiac risk means please call to explain prior to upcoming hip sx

## 2018-12-04 ENCOUNTER — Telehealth: Payer: Self-pay | Admitting: *Deleted

## 2018-12-04 DIAGNOSIS — E785 Hyperlipidemia, unspecified: Secondary | ICD-10-CM

## 2018-12-04 MED ORDER — ATORVASTATIN CALCIUM 40 MG PO TABS: 40.0000 mg | ORAL_TABLET | Freq: Every day | ORAL | 3 refills | Status: DC

## 2018-12-04 NOTE — Telephone Encounter (Signed)
Returned call to patient, we discussed surgical clearance and what moderate cardiac risk means.   She verbalized understanding and will return call to surgical center to schedule upcoming surgery.    Advised pt to call for any further questions or concerns.

## 2018-12-04 NOTE — Telephone Encounter (Signed)
-----   Message from Arvil Chaco, PA-C sent at 12/03/2018  2:16 PM EDT ----- Please let Bailey Ayala know that her LDL (a lab that we look at to measure the fat in her blood) is slightly above goal. Her most recent LDL is 79, and we hope to keep her below an LDL of 70. This LDL is also increased from her previous LDL of 52 (two years ago). She also continues to show elevated triglycerides, though improved from her previous labs.   She is currently on Lipitor 20mg  daily. Recommend she increase this to Lipitor to 40mg  daily for better control of her LDL, and given her history of coronary artery disease and peripheral arterial disease. We will recheck labs in 6-8 weeks, if she is agreeable to this change.

## 2018-12-04 NOTE — Telephone Encounter (Signed)
Results called to pt. Pt verbalized understanding of results, to increase lipitor to 40 mg daily and to go to the [facility] in 6-8 weeks for fasting lab work.  Rx sent to pharmacy and lab order entered.

## 2018-12-05 ENCOUNTER — Telehealth: Payer: Self-pay | Admitting: Cardiovascular Disease

## 2018-12-05 ENCOUNTER — Other Ambulatory Visit: Payer: Self-pay

## 2018-12-05 MED ORDER — ATORVASTATIN CALCIUM 40 MG PO TABS: 40.0000 mg | ORAL_TABLET | Freq: Every day | ORAL | 3 refills | Status: DC

## 2018-12-05 NOTE — Telephone Encounter (Signed)
°*  STAT* If patient is at the pharmacy, call can be transferred to refill team.   1. Which medications need to be refilled? (please list name of each medication and dose if known)   Lipitor 40 mg po q d   2. Which pharmacy/location (including street and city if local pharmacy) is medication to be sent to?  [facility]   3. Do they need a 30 day or 90 day supply? [place]

## 2018-12-05 NOTE — Telephone Encounter (Signed)
atorvastatin (LIPITOR) 40 MG tablet 90 tablet 3 12/05/2018 03/05/2019   Sig - Route: Take 1 tablet (40 mg total) by mouth daily. - Oral   Sent to pharmacy as: atorvastatin (LIPITOR) 40 MG tablet   E-Prescribing Status: Receipt confirmed by pharmacy (12/05/2018 9:19 AM EDT)   Pharmacy  [facility], [place]

## 2018-12-20 ENCOUNTER — Other Ambulatory Visit: Payer: Self-pay

## 2018-12-20 DIAGNOSIS — R002 Palpitations: Secondary | ICD-10-CM | POA: Diagnosis not present

## 2018-12-21 ENCOUNTER — Inpatient Hospital Stay: Payer: Medicare Other | Attending: Hematology and Oncology

## 2018-12-21 ENCOUNTER — Other Ambulatory Visit: Payer: Self-pay

## 2018-12-21 DIAGNOSIS — C9000 Multiple myeloma not having achieved remission: Secondary | ICD-10-CM | POA: Diagnosis not present

## 2018-12-21 DIAGNOSIS — D472 Monoclonal gammopathy: Secondary | ICD-10-CM

## 2018-12-21 LAB — RETICULOCYTES
Immature Retic Fract: 23.1 % — ABNORMAL HIGH (ref 2.3–15.9)
RBC.: 2.65 MIL/uL — ABNORMAL LOW (ref 3.87–5.11)
Retic Count, Absolute: 79.8 10*3/uL (ref 19.0–186.0)
Retic Ct Pct: 3 % (ref 0.4–3.1)

## 2018-12-21 LAB — CBC WITH DIFFERENTIAL/PLATELET
Abs Immature Granulocytes: 0.06 10*3/uL (ref 0.00–0.07)
Basophils Absolute: 0 10*3/uL (ref 0.0–0.1)
Basophils Relative: 1 %
Eosinophils Absolute: 0.3 10*3/uL (ref 0.0–0.5)
Eosinophils Relative: 6 %
HCT: 28.8 % — ABNORMAL LOW (ref 36.0–46.0)
Hemoglobin: 9 g/dL — ABNORMAL LOW (ref 12.0–15.0)
Immature Granulocytes: 1 %
Lymphocytes Relative: 20 %
Lymphs Abs: 1.2 10*3/uL (ref 0.7–4.0)
MCH: 34 pg (ref 26.0–34.0)
MCHC: 31.3 g/dL (ref 30.0–36.0)
MCV: 108.7 fL — ABNORMAL HIGH (ref 80.0–100.0)
Monocytes Absolute: 0.6 10*3/uL (ref 0.1–1.0)
Monocytes Relative: 10 %
Neutro Abs: 3.7 10*3/uL (ref 1.7–7.7)
Neutrophils Relative %: 62 %
Platelets: 265 10*3/uL (ref 150–400)
RBC: 2.65 MIL/uL — ABNORMAL LOW (ref 3.87–5.11)
RDW: 13.6 % (ref 11.5–15.5)
WBC: 5.9 10*3/uL (ref 4.0–10.5)
nRBC: 0 % (ref 0.0–0.2)

## 2018-12-21 LAB — BASIC METABOLIC PANEL
Anion gap: 9 (ref 5–15)
BUN: 34 mg/dL — ABNORMAL HIGH (ref 8–23)
CO2: 20 mmol/L — ABNORMAL LOW (ref 22–32)
Calcium: 9 mg/dL (ref 8.9–10.3)
Chloride: 108 mmol/L (ref 98–111)
Creatinine, Ser: 1.62 mg/dL — ABNORMAL HIGH (ref 0.44–1.00)
GFR calc Af Amer: 35 mL/min — ABNORMAL LOW (ref >60)
GFR calc non Af Amer: 30 mL/min — ABNORMAL LOW (ref >60)
Glucose, Bld: 115 mg/dL — ABNORMAL HIGH (ref 70–99)
Potassium: 4.7 mmol/L (ref 3.5–5.1)
Sodium: 137 mmol/L (ref 135–145)

## 2018-12-26 ENCOUNTER — Telehealth: Payer: Self-pay

## 2018-12-26 MED ORDER — METOPROLOL TARTRATE 50 MG PO TABS: 50.0000 mg | ORAL_TABLET | Freq: Two times a day (BID) | ORAL | 11 refills | Status: DC

## 2018-12-26 NOTE — Telephone Encounter (Signed)
-----   Message from Rise Mu, PA-C sent at 12/26/2018 11:17 AM EDT ----- Heart monitor showed NSR with an average rate of 74 bpm.  50 episodes of SVT with the longest run lasting 54 seconds. Some of these episodes could not definitively exclude, though given the short duration, the burden is not great enough to justify anticoagulation.  Rare PVCs.  Some of the patient triggered events correlated with PVCs and atrial ectopy.   Recommendations: -Increase Lopressor to 50 mg bid -Follow up as directed

## 2018-12-26 NOTE — Telephone Encounter (Signed)
Call to patient to discuss results of long term monitor. She verbalized understanding and agreeable to POC.   Updated Rx sent to pt preferred RX. Suggested that patient take BP 1 x daily and bring logs with her to each visit.   Advised pt to call for any further questions or concerns.

## 2018-12-27 ENCOUNTER — Encounter: Payer: Self-pay | Admitting: Podiatry

## 2018-12-27 ENCOUNTER — Ambulatory Visit (INDEPENDENT_AMBULATORY_CARE_PROVIDER_SITE_OTHER): Payer: Medicare Other | Admitting: Podiatry

## 2018-12-27 ENCOUNTER — Other Ambulatory Visit: Payer: Self-pay

## 2018-12-27 DIAGNOSIS — B351 Tinea unguium: Secondary | ICD-10-CM | POA: Diagnosis not present

## 2018-12-27 DIAGNOSIS — M79675 Pain in left toe(s): Secondary | ICD-10-CM | POA: Diagnosis not present

## 2018-12-27 DIAGNOSIS — M79674 Pain in right toe(s): Secondary | ICD-10-CM | POA: Diagnosis not present

## 2018-12-27 NOTE — Progress Notes (Signed)
Complaint:  Visit Type: Patient returns to my office for continued preventative foot care services. Complaint: Patient states" my nails have grown long and thick and become painful to walk and wear shoes" Patient has been diagnosed with DM with neuropathy. The patient presents for preventative foot care services. No changes to ROS  Podiatric Exam: Vascular: dorsalis pedis and posterior tibial pulses are palpable bilateral. Capillary return is immediate. Temperature gradient is WNL. Skin turgor WNL  Sensorium: Diminished  Semmes Weinstein monofilament test. Normal tactile sensation bilaterally. Nail Exam: Pt has thick disfigured discolored nails with subungual debris noted bilateral entire nail hallux through fifth toenails Ulcer Exam: There is no evidence of ulcer or pre-ulcerative changes or infection. Orthopedic Exam: Muscle tone and strength are WNL. No limitations in general ROM. No crepitus or effusions noted. Foot type and digits show no abnormalities.HAV with overlapping 2 nd  B/L.  PTTD right foot.  DJD rearfoot  Right foot. Skin: No Porokeratosis. No infection or ulcers.  Pinch callus  B/L asymptomatic.  Diagnosis:  Onychomycosis, , Pain in right toe, pain in left toes  Callus  B/L  Treatment & Plan Procedures and Treatment: Consent by patient was obtained for treatment procedures.   Debridement of mycotic and hypertrophic toenails, 1 through 5 bilateral and clearing of subungual debris. No ulceration, no infection noted. Debride pinch callus  B/l. Return Visit-Office Procedure: Patient instructed to return to the office for a follow up visit 3 months for continued evaluation and treatment.    Gardiner Barefoot DPM

## 2019-01-11 ENCOUNTER — Telehealth: Payer: Self-pay | Admitting: Cardiovascular Disease

## 2019-01-11 NOTE — Telephone Encounter (Signed)
Patient c/o large  blood blisters on sides of tongue.  She says this has never happened before and doesn't know why.  Would like insight from Dr. Fletcher Anon.     Patient advised to also call pcp office while waiting on call back .

## 2019-01-11 NOTE — Telephone Encounter (Signed)
Noted  

## 2019-01-11 NOTE — Telephone Encounter (Signed)
Scheduled appt for Monday

## 2019-01-11 NOTE — Telephone Encounter (Signed)
Pt called to schedule an appt for today with PCP for blood Blisters on her tongue but nothing was available/ Pt would like a call from the nurse to advise what she can do or what they could be/ please advise

## 2019-01-14 ENCOUNTER — Ambulatory Visit: Payer: Medicare Other

## 2019-01-14 ENCOUNTER — Ambulatory Visit: Payer: No Typology Code available for payment source | Admitting: Nurse Practitioner

## 2019-01-18 DIAGNOSIS — N183 Chronic kidney disease, stage 3 unspecified: Secondary | ICD-10-CM | POA: Insufficient documentation

## 2019-01-18 DIAGNOSIS — C9000 Multiple myeloma not having achieved remission: Secondary | ICD-10-CM | POA: Insufficient documentation

## 2019-01-18 DIAGNOSIS — D631 Anemia in chronic kidney disease: Secondary | ICD-10-CM | POA: Insufficient documentation

## 2019-01-18 DIAGNOSIS — N189 Chronic kidney disease, unspecified: Secondary | ICD-10-CM | POA: Insufficient documentation

## 2019-01-21 DIAGNOSIS — C9000 Multiple myeloma not having achieved remission: Secondary | ICD-10-CM | POA: Diagnosis not present

## 2019-01-21 DIAGNOSIS — D631 Anemia in chronic kidney disease: Secondary | ICD-10-CM | POA: Diagnosis not present

## 2019-01-21 DIAGNOSIS — N183 Chronic kidney disease, stage 3 (moderate): Secondary | ICD-10-CM | POA: Diagnosis not present

## 2019-01-21 DIAGNOSIS — I10 Essential (primary) hypertension: Secondary | ICD-10-CM | POA: Diagnosis not present

## 2019-01-28 ENCOUNTER — Ambulatory Visit (INDEPENDENT_AMBULATORY_CARE_PROVIDER_SITE_OTHER): Payer: Medicare Other

## 2019-01-28 ENCOUNTER — Other Ambulatory Visit: Payer: Self-pay

## 2019-01-28 DIAGNOSIS — Z23 Encounter for immunization: Secondary | ICD-10-CM | POA: Diagnosis not present

## 2019-01-29 ENCOUNTER — Other Ambulatory Visit
Admission: RE | Admit: 2019-01-29 | Discharge: 2019-01-29 | Disposition: A | Discharge status: Home / Self Care | Payer: Medicare Other | Source: Ambulatory Visit | Attending: Physician Assistant | Admitting: Physician Assistant

## 2019-01-29 DIAGNOSIS — E785 Hyperlipidemia, unspecified: Secondary | ICD-10-CM

## 2019-01-29 LAB — HEPATIC FUNCTION PANEL
ALT: 14 U/L (ref 0–44)
AST: 20 U/L (ref 15–41)
Albumin: 4.3 g/dL (ref 3.5–5.0)
Alkaline Phosphatase: 74 U/L (ref 38–126)
Bilirubin, Direct: <0.1 mg/dL (ref 0.0–0.2)
Total Bilirubin: 0.5 mg/dL (ref 0.3–1.2)
Total Protein: 8.4 g/dL — ABNORMAL HIGH (ref 6.5–8.1)

## 2019-01-29 LAB — LIPID PANEL
Cholesterol: 144 mg/dL (ref 0–200)
HDL: 40 mg/dL — ABNORMAL LOW (ref >40)
LDL Cholesterol: 61 mg/dL (ref 0–99)
Total CHOL/HDL Ratio: 3.6 RATIO
Triglycerides: 217 mg/dL — ABNORMAL HIGH (ref <150)
VLDL: 43 mg/dL — ABNORMAL HIGH (ref 0–40)

## 2019-02-01 ENCOUNTER — Telehealth: Payer: Self-pay | Admitting: Family Medicine

## 2019-02-06 ENCOUNTER — Ambulatory Visit (INDEPENDENT_AMBULATORY_CARE_PROVIDER_SITE_OTHER): Payer: Medicare Other

## 2019-02-06 VITALS — Ht 65.5 in | Wt 170.0 lb

## 2019-02-06 DIAGNOSIS — Z0000 Encounter for general adult medical examination without abnormal findings: Secondary | ICD-10-CM

## 2019-02-06 NOTE — Patient Instructions (Signed)
Bailey Ayala , Thank you for taking time to come for your Medicare Wellness Visit. I appreciate your ongoing commitment to your health goals. Please review the following plan we discussed and let me know if I can assist you in the future.   Screening recommendations/referrals: Colonoscopy: no longer required Mammogram: up to date Bone Density: up to date Recommended yearly ophthalmology/optometry visit for glaucoma screening and checkup Recommended yearly dental visit for hygiene and checkup  Vaccinations: Influenza vaccine: up to date Pneumococcal vaccine: up to date Tdap vaccine: up to date Shingles vaccine: shingrix eligible     Advanced directives: .copy on file   Conditions/risks identified: none  Next appointment: follow up in one year for your annual wellness visit    Preventive Care 65 Years and Older, Female Preventive care refers to lifestyle choices and visits with your health care provider that can promote health and wellness. What does preventive care include?  A yearly physical exam. This is also called an annual well check.  Dental exams once or twice a year.  Routine eye exams. Ask your health care provider how often you should have your eyes checked.  Personal lifestyle choices, including:  Daily care of your teeth and gums.  Regular physical activity.  Eating a healthy diet.  Avoiding tobacco and drug use.  Limiting alcohol use.  Practicing safe sex.  Taking low-dose aspirin every day.  Taking vitamin and mineral supplements as recommended by your health care provider. What happens during an annual well check? The services and screenings done by your health care provider during your annual well check will depend on your age, overall health, lifestyle risk factors, and family history of disease. Counseling  Your health care provider may ask you questions about your:  Alcohol use.  Tobacco use.  Drug use.  Emotional well-being.  Home  and relationship well-being.  Sexual activity.  Eating habits.  History of falls.  Memory and ability to understand (cognition).  Work and work Statistician.  Reproductive health. Screening  You may have the following tests or measurements:  Height, weight, and BMI.  Blood pressure.  Lipid and cholesterol levels. These may be checked every 5 years, or more frequently if you are over 73 years old.  Skin check.  Lung cancer screening. You may have this screening every year starting at age 78 if you have a 30-pack-year history of smoking and currently smoke or have quit within the past 15 years.  Fecal occult blood test (FOBT) of the stool. You may have this test every year starting at age 2.  Flexible sigmoidoscopy or colonoscopy. You may have a sigmoidoscopy every 5 years or a colonoscopy every 10 years starting at age 75.  Hepatitis C blood test.  Hepatitis B blood test.  Sexually transmitted disease (STD) testing.  Diabetes screening. This is done by checking your blood sugar (glucose) after you have not eaten for a while (fasting). You may have this done every 1-3 years.  Bone density scan. This is done to screen for osteoporosis. You may have this done starting at age 60.  Mammogram. This may be done every 1-2 years. Talk to your health care provider about how often you should have regular mammograms. Talk with your health care provider about your test results, treatment options, and if necessary, the need for more tests. Vaccines  Your health care provider may recommend certain vaccines, such as:  Influenza vaccine. This is recommended every year.  Tetanus, diphtheria, and acellular pertussis (Tdap, Td)  vaccine. You may need a Td booster every 10 years.  Zoster vaccine. You may need this after age 74.  Pneumococcal 13-valent conjugate (PCV13) vaccine. One dose is recommended after age 27.  Pneumococcal polysaccharide (PPSV23) vaccine. One dose is recommended  after age 14. Talk to your health care provider about which screenings and vaccines you need and how often you need them. This information is not intended to replace advice given to you by your health care provider. Make sure you discuss any questions you have with your health care provider. Document Released: 05/08/2015 Document Revised: 12/30/2015 Document Reviewed: 02/10/2015 Elsevier Interactive Patient Education  2017 [employer] Prevention in the Home Falls can cause injuries. They can happen to people of all ages. There are many things you can do to make your home safe and to help prevent falls. What can I do on the outside of my home?  Regularly fix the edges of walkways and driveways and fix any cracks.  Remove anything that might make you trip as you walk through a door, such as a raised step or threshold.  Trim any bushes or trees on the path to your home.  Use bright outdoor lighting.  Clear any walking paths of anything that might make someone trip, such as rocks or tools.  Regularly check to see if handrails are loose or broken. Make sure that both sides of any steps have handrails.  Any raised decks and porches should have guardrails on the edges.  Have any leaves, snow, or ice cleared regularly.  Use sand or salt on walking paths during winter.  Clean up any spills in your garage right away. This includes oil or grease spills. What can I do in the bathroom?  Use night lights.  Install grab bars by the toilet and in the tub and shower. Do not use towel bars as grab bars.  Use non-skid mats or decals in the tub or shower.  If you need to sit down in the shower, use a plastic, non-slip stool.  Keep the floor dry. Clean up any water that spills on the floor as soon as it happens.  Remove soap buildup in the tub or shower regularly.  Attach bath mats securely with double-sided non-slip rug tape.  Do not have throw rugs and other things on the floor  that can make you trip. What can I do in the bedroom?  Use night lights.  Make sure that you have a light by your bed that is easy to reach.  Do not use any sheets or blankets that are too big for your bed. They should not hang down onto the floor.  Have a firm chair that has side arms. You can use this for support while you get dressed.  Do not have throw rugs and other things on the floor that can make you trip. What can I do in the kitchen?  Clean up any spills right away.  Avoid walking on wet floors.  Keep items that you use a lot in easy-to-reach places.  If you need to reach something above you, use a strong step stool that has a grab bar.  Keep electrical cords out of the way.  Do not use floor polish or wax that makes floors slippery. If you must use wax, use non-skid floor wax.  Do not have throw rugs and other things on the floor that can make you trip. What can I do with my stairs?  Do not leave  any items on the stairs.  Make sure that there are handrails on both sides of the stairs and use them. Fix handrails that are broken or loose. Make sure that handrails are as long as the stairways.  Check any carpeting to make sure that it is firmly attached to the stairs. Fix any carpet that is loose or worn.  Avoid having throw rugs at the top or bottom of the stairs. If you do have throw rugs, attach them to the floor with carpet tape.  Make sure that you have a light switch at the top of the stairs and the bottom of the stairs. If you do not have them, ask someone to add them for you. What else can I do to help prevent falls?  Wear shoes that:  Do not have high heels.  Have rubber bottoms.  Are comfortable and fit you well.  Are closed at the toe. Do not wear sandals.  If you use a stepladder:  Make sure that it is fully opened. Do not climb a closed stepladder.  Make sure that both sides of the stepladder are locked into place.  Ask someone to hold it  for you, if possible.  Clearly mark and make sure that you can see:  Any grab bars or handrails.  First and last steps.  Where the edge of each step is.  Use tools that help you move around (mobility aids) if they are needed. These include:  Canes.  Walkers.  Scooters.  Crutches.  Turn on the lights when you go into a dark area. Replace any light bulbs as soon as they burn out.  Set up your furniture so you have a clear path. Avoid moving your furniture around.  If any of your floors are uneven, fix them.  If there are any pets around you, be aware of where they are.  Review your medicines with your doctor. Some medicines can make you feel dizzy. This can increase your chance of falling. Ask your doctor what other things that you can do to help prevent falls. This information is not intended to replace advice given to you by your health care provider. Make sure you discuss any questions you have with your health care provider. Document Released: 02/05/2009 Document Revised: 09/17/2015 Document Reviewed: 05/16/2014 Elsevier Interactive Patient Education  2017 [employer].

## 2019-02-06 NOTE — Progress Notes (Signed)
Subjective:   Bailey Ayala is a 78 y.o. female who presents for Medicare Annual (Subsequent) preventive examination.  This visit is being conducted via phone call  - after an attmept to do on video chat - due to the COVID-19 pandemic. This patient has given me verbal consent via phone to conduct this visit, patient states they are participating from their home address. Some vital signs may be absent or patient reported.   Patient identification: identified by name, DOB, and current address.    Review of Systems:   Cardiac Risk Factors include: advanced age (>46mn, >>58women);hypertension;smoking/ tobacco exposure     Objective:     Vitals: Ht 5' 5.5" (1.664 m)   Wt 170 lb (77.1 kg) Comment: pt reported  BMI 27.86 kg/m   Body mass index is 27.86 kg/m.  Advanced Directives 02/06/2019 11/28/2018 11/09/2018 08/09/2018 05/18/2018 05/10/2018 02/05/2018  Does Patient Have a Medical Advance Directive? _0  Yes Yes  Type of Advance Directive Living will;Healthcare Power of [place] will [place] will [place] will [place]Living will [place]al[place] HeightsLiving will  Does patient want to make changes to medical advance directive? - - - No - Patient declined No - Patient declined - -  Copy of [place] Chart? Yes - validated most recent copy scanned in chart (See row information) - Yes - validated most recent copy scanned in chart (See row information) Yes - validated most recent copy scanned in chart (See row information) Yes - validated most recent copy scanned in chart (See row information) Yes - validated most recent copy scanned in chart (See row information) Yes  Would patient like information on creating a medical advance directive? - - - - - - -    Tobacco Social History   Tobacco Use  Smoking Status Former Smoker  .  Packs/day: 3.00  . Years: 25.00  . Pack years: 75.00  . Types: Cigarettes  . Quit date: 11/03/1989  . Years since quitting: 29.2  Smokeless Tobacco Never Used     Counseling given: Not Answered   Clinical Intake:  Pre-visit preparation completed: Yes  Pain : 0-10 Pain Score: 5  Pain Type: Chronic pain Pain Location: Back Pain Orientation: Lower Pain Radiating Towards: right hip Pain Descriptors / Indicators: Aching Pain Frequency: Constant     Nutritional Risks: None  How often do you need to have someone help you when you read instructions, pamphlets, or other written materials from your doctor or pharmacy?: 1 - Never  Interpreter Needed?: No  Information entered by :: Tiffany Hill,LPN  Past Medical History:  Diagnosis Date  . Allergy 1980  . Anemia 09-2017  . Arthritis   . Blood dyscrasia    BLEEDS EASILY   . Blood transfusion without reported diagnosis 2011 or 12  . Cataract ?  . CHF (congestive heart failure) ([place] ?  .Marland KitchenClotting disorder ([place] ?  .Marland KitchenCOPD (chronic obstructive pulmonary disease) (HDelta   . Diabetes mellitus without complication ([place]   . Diffuse cystic mastopathy   . Emphysema of lung ([place] ?  .Marland KitchenHyperlipidemia   . Hypertension   . Hypothyroidism   . L4-L5 disc bulge   . Multiple myeloma ([place]   . Myocardial infarction (HC[place] 2019  . Osteoporosis, post-menopausal   . PAD (peripheral artery disease) ([place]   . Renal insufficiency   . Shortness of breath  dyspnea    WITH EXERTION   . Stroke ([place])    TIA     2016     WAS FOUND ON SCAN ORDERED FROM NEUROL  . Thyroid disease   . TIA (transient ischemic attack) 2016   left hand weakness   Past Surgical History:  Procedure Laterality Date  . ABDOMINAL HYSTERECTOMY  1990  . BACK SURGERY  2011  . BREAST EXCISIONAL BIOPSY Left    2010?   Marland Kitchen BREAST SURGERY  1975   A lump removed left breast  . CATARACT EXTRACTION, BILATERAL    . COLONOSCOPY  1998  . EYE SURGERY Bilateral 05/06/14  . JOINT  REPLACEMENT     LT KNEE  . KNEE SURGERY Left 2012  . LEFT HEART CATH AND CORONARY ANGIOGRAPHY Left 11/19/2018   Procedure: LEFT HEART CATH AND CORONARY ANGIOGRAPHY;  Surgeon: Wellington Hampshire, MD;  Location: [facility];  Service: Cardiovascular;  Laterality: Left;  . OOPHORECTOMY  1990  . parathyroid transplant     2/3 THYROID REMOVED   (GOITER)  . SPINE SURGERY    . THYROID SURGERY    . TONSILLECTOMY    . TOTAL SHOULDER ARTHROPLASTY Left 01/14/2016   Procedure: LEFT TOTAL SHOULDER ARTHROPLASTY;  Surgeon: Justice Britain, MD;  Location: [facility];  Service: Orthopedics;  Laterality: Left;  . TOTAL SHOULDER ARTHROPLASTY Right 11/03/2016   Procedure: RIGHT TOTAL SHOULDER ARTHROPLASTY;  Surgeon: Justice Britain, MD;  Location: [facility];  Service: Orthopedics;  Laterality: Right;  . ULNAR NERVE REPAIR     LEFT  . vein closure procedure Left 2010   Family History  Problem Relation Age of Onset  . Heart disease Mother        MI  . Arthritis Mother   . Stroke Mother   . Hyperlipidemia Mother   . Hypertension Mother   . Varicose Veins Mother   . Heart disease Father        MI  . Hyperlipidemia Father   . Arthritis Father   . Cancer Father   . COPD Father   . Hearing loss Father   . Hypertension Father   . Vision loss Father   . Diabetes Sister   . COPD Sister   . Heart disease Sister   . Hyperlipidemia Sister   . Hypertension Sister   . Hyperlipidemia Brother   . COPD Brother   . Diabetes Brother   . Heart disease Brother   . Hypertension Brother   . Arthritis Sister   . Hyperlipidemia Sister   . Hypertension Sister   . Vision loss Sister   . Varicose Veins Sister   . Arthritis Brother   . Varicose Veins Brother   . Arthritis Daughter   . Arthritis Daughter   . Diabetes Daughter   . Cancer Paternal Aunt   . COPD Sister   . Heart disease Sister   . Hyperlipidemia Sister   . Hypertension Sister   . Varicose Veins Sister   . Heart disease Maternal Uncle   . Heart  disease Paternal Uncle    Social History   Socioeconomic History  . Marital status: Widowed    Spouse name: Not on file  . Number of children: Not on file  . Years of education: Not on file  . Highest education level: High school graduate  Occupational History  . Not on file  Social Needs  . Financial resource strain: Not hard at all  . Food insecurity  Worry: Never true    Inability: Never true  . Transportation needs    Medical: No    Non-medical: No  Tobacco Use  . Smoking status: Former Smoker    Packs/day: 3.00    Years: 25.00    Pack years: 75.00    Types: Cigarettes    Quit date: 11/03/1989    Years since quitting: 29.2  . Smokeless tobacco: Never Used  Substance and Sexual Activity  . Alcohol use: Not Currently    Alcohol/week: 0.0 standard drinks  . Drug use: No  . Sexual activity: Not Currently    Birth control/protection: None  Lifestyle  . Physical activity    Days per week: 0 days    Minutes per session: 0 min  . Stress: Not at all  Relationships  . Social Herbalist on phone: Twice a week    Gets together: Twice a week    Attends religious service: Never    Active member of club or organization: No    Attends meetings of clubs or organizations: Never    Relationship status: Widowed  Other Topics Concern  . Not on file  Social History Narrative  . Not on file    Outpatient Encounter Medications as of 02/06/2019  Medication Sig  . aspirin EC 81 MG tablet Take 1 tablet (81 mg total) by mouth daily.  Marland Kitchen atorvastatin (LIPITOR) 40 MG tablet Take 1 tablet (40 mg total) by mouth daily.  . benazepril (LOTENSIN) 20 MG tablet Take 1 tablet (20 mg total) by mouth daily.  . ferrous sulfate 325 (65 FE) MG tablet Take 325 mg by mouth daily with breakfast.  . levothyroxine (SYNTHROID, LEVOTHROID) 100 MCG tablet Take 1 tablet (100 mcg total) by mouth daily.  . metoprolol tartrate (LOPRESSOR) 50 MG tablet Take 1 tablet (50 mg total) by mouth 2 (two)  times daily.  . Multiple Vitamin (MULTIVITAMIN WITH MINERALS) TABS tablet Take 1 tablet by mouth daily.  Marland Kitchen triamcinolone cream (KENALOG) 0.1 % Apply 1 application topically 2 (two) times daily. (Patient taking differently: Apply 1 application topically 2 (two) times daily as needed (rash/skin irritation). )   No facility-administered encounter medications on file as of 02/06/2019.     Activities of Daily Living In your present state of health, do you have any difficulty performing the following activities: 02/06/2019 11/19/2018  Hearing? Y Y  Comment hearing aids -  Vision? N N  Comment eyeglasses, dr.porfilio -  Difficulty concentrating or making decisions? Y N  Comment comes back -  Walking or climbing stairs? Y Y  Dressing or bathing? N N  Doing errands, shopping? N -  Preparing Food and eating ? N -  Using the Toilet? N -  In the past six months, have you accidently leaked urine? N -  Do you have problems with loss of bowel control? N -  Managing your Medications? N -  Managing your Finances? N -  Housekeeping or managing your Housekeeping? N -  Some recent data might be hidden    Patient Care Team: Guadalupe Maple, MD as PCP - General (Family Medicine) Wellington Hampshire, MD as PCP - Cardiology (Cardiology) Christene Lye, MD (General Surgery) Guadalupe Maple, MD (Family Medicine) Birder Robson, MD as Referring Physician (Ophthalmology) Vladimir Crofts, MD (Neurology) Anthonette Legato, MD (Internal Medicine) Wellington Hampshire, MD as Consulting Physician (Cardiology)    Assessment:   This is a routine wellness examination for [facility].  Exercise  Activities and Dietary recommendations Current Exercise Habits: The patient does not participate in regular exercise at present, Exercise limited by: None identified  Goals    . DIET - INCREASE WATER INTAKE     Recommend drinking at least 6-8 glasses of water a day     . Increase water intake     Recommend drinking  at least 4-5 glasses of water a day       Fall Risk: Fall Risk  02/06/2019 06/28/2018 12/28/2017 11/22/2017 05/29/2017  Falls in the past year? 0 0 No No No  Number falls in past yr: 0 0 - - -  Injury with Fall? 0 0 - - -    FALL RISK PREVENTION PERTAINING TO THE HOME:  Any stairs in or around the home? Yes  If so, are there any without handrails? No   Home free of loose throw rugs in walkways, pet beds, electrical cords, etc? Yes  Adequate lighting in your home to reduce risk of falls? Yes   ASSISTIVE DEVICES UTILIZED TO PREVENT FALLS:  Life alert? No  Use of a cane, walker or w/c? Yes  cane  Grab bars in the bathroom? yes Shower chair or bench in shower? No  Elevated toilet seat or a handicapped toilet? Yes   DME ORDERS:  DME order needed?  No   TIMED UP AND GO:  Unable to perform    Depression Screen PHQ 2/9 Scores 02/06/2019 06/28/2018 11/22/2017 05/29/2017  PHQ - 2 Score 0 0 0 0     Cognitive Function     6CIT Screen 11/22/2017 10/27/2016  What Year? 0 points 0 points  What month? 0 points 0 points  What time? 0 points 0 points  Count back from 20 0 points 0 points  Months in reverse 0 points 0 points  Repeat phrase 0 points 0 points  Total Score 0 0    Immunization History  Administered Date(s) Administered  . Fluad Quad(high Dose 65+) 01/28/2019  . Influenza, High Dose Seasonal PF 06/06/2017  . Influenza,inj,Quad PF,6+ Mos 01/13/2015, 04/05/2016  . Influenza,inj,quad, With Preservative 12/25/2017  . Pneumococcal Conjugate-13 09/29/2015  . Pneumococcal-Unspecified 08/21/2007  . Td 08/21/2007  . Tdap 11/22/2017    Qualifies for Shingles Vaccine? Yes  Zostavax completed n/a. Due for Shingrix. Education has been provided regarding the importance of this vaccine. Pt has been advised to call insurance company to determine out of pocket expense. Advised may also receive vaccine at local pharmacy or Health Dept. Verbalized acceptance and understanding.  Tdap: up  to date   Flu Vaccine: up to date   Pneumococcal Vaccine: up to date   Screening Tests Health Maintenance  Topic Date Due  . TETANUS/TDAP  11/23/2027  . INFLUENZA VACCINE  Completed  . DEXA SCAN  Completed  . PNA vac Low Risk Adult  Completed    Cancer Screenings:  Colorectal Screening: no longer required   Mammogram: Completed 10/01/2018.   Bone Density: up to date   Lung Cancer Screening: (Low Dose CT Chest recommended if Age 21-80 years, 30 pack-year currently smoking OR have quit w/in 15years.) does not qualify.    Additional Screening:  Hepatitis C Screening: does not qualify  Vision Screening: Recommended annual ophthalmology exams for early detection of glaucoma and other disorders of the eye. Is the patient up to date with their annual eye exam?  Yes  Who is the provider or what is the name of the office in which the pt attends annual eye  exams? [facility]   Dental Screening: Recommended annual dental exams for proper oral hygiene  Community Resource Referral:  CRR required this visit?  No       Plan:  I have personally reviewed and addressed the Medicare Annual Wellness questionnaire and have noted the following in the patient's chart:  A. Medical and social history B. Use of alcohol, tobacco or illicit drugs  C. Current medications and supplements D. Functional ability and status E.  Nutritional status F.  Physical activity G. Advance directives H. List of other physicians I.  Hospitalizations, surgeries, and ER visits in previous 12 months J.  [place] such as hearing and vision if needed, cognitive and depression L. Referrals and appointments   In addition, I have reviewed and discussed with patient certain preventive protocols, quality metrics, and best practice recommendations. A written personalized care plan for preventive services as well as general preventive health recommendations were provided to patient.  Signed,     Bevelyn Ngo, LPN  [id number] Nurse Health Advisor   Nurse Notes: none

## 2019-02-08 ENCOUNTER — Encounter: Payer: Self-pay | Admitting: Hematology and Oncology

## 2019-02-08 NOTE — Progress Notes (Signed)
No new changes noted . The patient name and DOB has been verified by phone

## 2019-02-10 NOTE — Progress Notes (Signed)
[facility]  [address] [place], [place] [address] Phone: [phone]  Fax: [phone]   Clinic Day:  02/11/2019  Referring physician: Guadalupe Maple, MD  Chief Complaint: Bailey Ayala is a 78 y.o. female with smoldering myeloma who is seen for 3 month assessment.  HPI: The patient was last seen in the medical oncology clinic on 11/09/2018. At that time, she denied any bone pain, infections or B symptoms.  She had been having chest pain and was undergoing a cardiac evaluation.   She had an abnormal stress test and underwent cardiac catheterization on 11/19/2018.  The mid LAD11 was 80% stenosis and the mid LAD-2 was 100% stenosed.  There were collaterals.  The mid RCA was 50% stenosed and the proximal RCA was 30% stenosed.  There was mildly reduced LV systolic function with an ejection fraction of 45-50% with anteroapical hypokinesis.    She developed a left facial droop on 11/28/2018.  Symptoms lasted < 24 hours.  Head MRI revealed no acute finding. There was moderate chronic small-vessel ischemic changes of the cerebral hemispheres, minimally progressive since 2017.   There was no specific cause of the clinical presentation identified. She was instructed to stop her metoprolol as a likely cause, but she did not.  Metoprolol was subsequently represcribed by Christell Faith, PA at [facility].   She saw Dr. Holley Raring on 01/21/2019.  Sshe was noted to have stable chronic kidney disease with a recent EGFR of 39 ml/min.   During the interim, she has issues with her hips; she needs a right hip replacement.  She is in contact with an orthopedic surgeon in [place].   Past Medical History:  Diagnosis Date   Allergy 1980   Anemia 09-2017   Arthritis    Blood dyscrasia    BLEEDS EASILY    Blood transfusion without reported diagnosis 2011 or 12   Cataract ?   CHF (congestive heart failure) ([place]) ?   Clotting disorder ([place]) ?   COPD  (chronic obstructive pulmonary disease) (HCC)    Diabetes mellitus without complication ([place])    Diffuse cystic mastopathy    Emphysema of lung ([place]) ?   Hyperlipidemia    Hypertension    Hypothyroidism    L4-L5 disc bulge    Multiple myeloma ([place])    Myocardial infarction ([place]) 2019   Osteoporosis, post-menopausal    PAD (peripheral artery disease) (HCC)    Renal insufficiency    Shortness of breath dyspnea    WITH EXERTION    Stroke ([place])    TIA     2016     WAS FOUND ON SCAN ORDERED FROM NEUROL   Thyroid disease    TIA (transient ischemic attack) 2016   left hand weakness    Past Surgical History:  Procedure Laterality Date   ABDOMINAL HYSTERECTOMY  1990   BACK SURGERY  2011   BREAST EXCISIONAL BIOPSY Left    2010?    BREAST SURGERY  1975   A lump removed left breast   CATARACT EXTRACTION, BILATERAL     COLONOSCOPY  1998   EYE SURGERY Bilateral 05/06/14   JOINT REPLACEMENT     LT KNEE   KNEE SURGERY Left 2012   LEFT HEART CATH AND CORONARY ANGIOGRAPHY Left 11/19/2018   Procedure: LEFT HEART CATH AND CORONARY ANGIOGRAPHY;  Surgeon: Wellington Hampshire, MD;  Location: [facility];  Service: Cardiovascular;  Laterality: Left;   OOPHORECTOMY  1990  parathyroid transplant     2/3 THYROID REMOVED   ([facility])   SPINE SURGERY     THYROID SURGERY     TONSILLECTOMY     TOTAL SHOULDER ARTHROPLASTY Left 01/14/2016   Procedure: LEFT TOTAL SHOULDER ARTHROPLASTY;  Surgeon: Justice Britain, MD;  Location: [facility];  Service: Orthopedics;  Laterality: Left;   TOTAL SHOULDER ARTHROPLASTY Right 11/03/2016   Procedure: RIGHT TOTAL SHOULDER ARTHROPLASTY;  Surgeon: Justice Britain, MD;  Location: [facility];  Service: Orthopedics;  Laterality: Right;   ULNAR NERVE REPAIR     LEFT   vein closure procedure Left 2010    Family History  Problem Relation Age of Onset   Heart disease Mother        MI   Arthritis Mother    Stroke Mother    Hyperlipidemia  Mother    Hypertension Mother    Varicose Veins Mother    Heart disease Father        MI   Hyperlipidemia Father    Arthritis Father    Cancer Father    COPD Father    Hearing loss Father    Hypertension Father    Vision loss Father    Diabetes Sister    COPD Sister    Heart disease Sister    Hyperlipidemia Sister    Hypertension Sister    Hyperlipidemia Brother    COPD Brother    Diabetes Brother    Heart disease Brother    Hypertension Brother    Arthritis Sister    Hyperlipidemia Sister    Hypertension Sister    Vision loss Sister    Varicose Veins Sister    Arthritis Brother    Varicose Veins Brother    Arthritis Daughter    Arthritis Daughter    Diabetes Daughter    Cancer Paternal Aunt    COPD Sister    Heart disease Sister    Hyperlipidemia Sister    Hypertension Sister    Varicose Veins Sister    Heart disease Maternal Uncle    Heart disease Paternal Uncle     Social History:  reports that she quit smoking about 29 years ago. Her smoking use included cigarettes. She has a 75.00 pack-year smoking history. She has never used smokeless tobacco. She reports previous alcohol use. She reports that she does not use drugs. Patient is a former 3 pack per day smoker for 25 years (75 pack year). Patient lives in [place]. She is a former Electrical engineer. Patient denies known exposures to radiation on toxins. The patient is alone today.   Allergies:  Allergies  Allergen Reactions   Codeine Other (See Comments)    hallucinations     Simvastatin Diarrhea and Nausea Only    Current Medications: Current Outpatient Medications  Medication Sig Dispense Refill   aspirin EC 81 MG tablet Take 1 tablet (81 mg total) by mouth daily. 90 tablet 3   atorvastatin (LIPITOR) 40 MG tablet Take 1 tablet (40 mg total) by mouth daily. 90 tablet 3   benazepril (LOTENSIN) 20 MG tablet Take 1 tablet (20 mg total) by mouth daily. 90 tablet 4    ferrous sulfate 325 (65 FE) MG tablet Take 325 mg by mouth daily with breakfast.     levothyroxine (SYNTHROID, LEVOTHROID) 100 MCG tablet Take 1 tablet (100 mcg total) by mouth daily. 90 tablet 4   metoprolol tartrate (LOPRESSOR) 50 MG tablet Take 1 tablet (50 mg total) by mouth 2 (two) times daily. 60 tablet  11   Multiple Vitamin (MULTIVITAMIN WITH MINERALS) TABS tablet Take 1 tablet by mouth daily.     triamcinolone cream (KENALOG) 0.1 % Apply 1 application topically 2 (two) times daily. (Patient not taking: Reported on 02/08/2019) 30 g 0   No current facility-administered medications for this visit.     Review of Systems  Constitutional: Negative.  Negative for chills, diaphoresis, fever, malaise/fatigue and weight loss (up 9 pounds).       "I'm ok".  HENT: Negative.  Negative for congestion, ear pain, nosebleeds, sinus pain and sore throat.   Eyes: Negative.  Negative for blurred vision, double vision and photophobia.  Respiratory: Positive for cough (chronic) and shortness of breath (exertional). Negative for hemoptysis and sputum production.        COPD.  Cardiovascular: Positive for chest pain (interval abnormal stress test). Negative for palpitations, orthopnea, claudication, leg swelling and PND.  Gastrointestinal: Positive for diarrhea (chronic loose stools). Negative for abdominal pain, blood in stool, constipation, melena, nausea and vomiting.  Genitourinary: Negative.  Negative for dysuria, frequency, hematuria and urgency.  Musculoskeletal: Positive for back pain (chronic) and joint pain (right hip). Negative for falls, myalgias and neck pain.  Skin: Negative.  Negative for itching and rash.  Neurological: Negative.  Negative for dizziness, tremors, sensory change, speech change, focal weakness, weakness and headaches.       Left facial droop, resolved.  Endo/Heme/Allergies: Does not bruise/bleed easily.       HYPOthyroidism - on levothyroxine.  Psychiatric/Behavioral:  Negative for depression and memory loss. The patient is not nervous/anxious and does not have insomnia.   All other systems reviewed and are negative.  Performance status (ECOG):  1  Vitals Blood pressure 131/75, pulse 60, temperature (!) 97.4 F (36.3 C), temperature source Tympanic, resp. rate 18, height 5' 5.5" (1.664 m), weight 175 lb 7.8 oz (79.6 kg), SpO2 97 %.   Physical Exam  Constitutional: She is oriented to person, place, and time. She appears well-developed and well-nourished. No distress.  HENT:  Head: Normocephalic and atraumatic.  Mouth/Throat: Oropharynx is clear and moist. No oropharyngeal exudate.  Shoulder length brown hair.  Upper dentures.  Mask.  Eyes: Pupils are equal, round, and reactive to light. Conjunctivae and EOM are normal. No scleral icterus.  Blue eyes.  Neck: Normal range of motion. Neck supple. No JVD present.  Cardiovascular: Normal rate, regular rhythm and normal heart sounds.  No murmur heard. Pulmonary/Chest: Effort normal and breath sounds normal. No respiratory distress. She has no wheezes. She has no rales.  Abdominal: Soft. Bowel sounds are normal. She exhibits no distension and no mass. There is no abdominal tenderness. There is no rebound and no guarding.  Musculoskeletal: Normal range of motion.        General: No edema.  Lymphadenopathy:       Head (right side): No preauricular, no posterior auricular and no occipital adenopathy present.       Head (left side): No preauricular, no posterior auricular and no occipital adenopathy present.    She has no cervical adenopathy.    She has no axillary adenopathy.       Right: No inguinal and no supraclavicular adenopathy present.       Left: No inguinal and no supraclavicular adenopathy present.  Neurological: She is alert and oriented to person, place, and time.  Skin: Skin is warm and dry. No rash noted. She is not diaphoretic. No erythema. No pallor.  Psychiatric: She has a normal mood and  affect. Her behavior is normal. Judgment and thought content normal.  Nursing note and vitals reviewed.   Appointment on 02/11/2019  Component Date Value Ref Range Status   Sodium 02/11/2019 134* 135 - 145 mmol/L Final   Potassium 02/11/2019 4.1  3.5 - 5.1 mmol/L Final   Chloride 02/11/2019 106  98 - 111 mmol/L Final   CO2 02/11/2019 22  22 - 32 mmol/L Final   Glucose, Bld 02/11/2019 106* 70 - 99 mg/dL Final   BUN 02/11/2019 31* 8 - 23 mg/dL Final   Creatinine, Ser 02/11/2019 1.52* 0.44 - 1.00 mg/dL Final   Calcium 02/11/2019 8.8* 8.9 - 10.3 mg/dL Final   Total Protein 02/11/2019 8.4* 6.5 - 8.1 g/dL Final   Albumin 02/11/2019 4.1  3.5 - 5.0 g/dL Final   AST 02/11/2019 21  15 - 41 U/L Final   ALT 02/11/2019 14  0 - 44 U/L Final   Alkaline Phosphatase 02/11/2019 64  38 - 126 U/L Final   Total Bilirubin 02/11/2019 0.2* 0.3 - 1.2 mg/dL Final   GFR calc non Af Amer 02/11/2019 33* >60 mL/min Final   GFR calc Af Amer 02/11/2019 38* >60 mL/min Final   Anion gap 02/11/2019 6  5 - 15 Final   Performed at [facility], [address] [place], [place] [address]   WBC 02/11/2019 5.8  4.0 - 10.5 K/uL Final   RBC 02/11/2019 2.92* 3.87 - 5.11 MIL/uL Final   Hemoglobin 02/11/2019 10.0* 12.0 - 15.0 g/dL Final   HCT 02/11/2019 31.3* 36.0 - 46.0 % Final   MCV 02/11/2019 107.2* 80.0 - 100.0 fL Final   MCH 02/11/2019 34.2* 26.0 - 34.0 pg Final   MCHC 02/11/2019 31.9  30.0 - 36.0 g/dL Final   RDW 02/11/2019 13.5  11.5 - 15.5 % Final   Platelets 02/11/2019 213  150 - 400 K/uL Final   nRBC 02/11/2019 0.0  0.0 - 0.2 % Final   Neutrophils Relative % 02/11/2019 62  % Final   Neutro Abs 02/11/2019 3.6  1.7 - 7.7 K/uL Final   Lymphocytes Relative 02/11/2019 23  % Final   Lymphs Abs 02/11/2019 1.3  0.7 - 4.0 K/uL Final   Monocytes Relative 02/11/2019 9  % Final   Monocytes Absolute 02/11/2019 0.5  0.1 - 1.0 K/uL Final   Eosinophils Relative 02/11/2019 5  %  Final   Eosinophils Absolute 02/11/2019 0.3  0.0 - 0.5 K/uL Final   Basophils Relative 02/11/2019 1  % Final   Basophils Absolute 02/11/2019 0.0  0.0 - 0.1 K/uL Final   Immature Granulocytes 02/11/2019 0  % Final   Abs Immature Granulocytes 02/11/2019 0.01  0.00 - 0.07 K/uL Final   Performed at [facility], [address] [place], [place] [address]    Assessment:  Bailey Ayala is a 78 y.o. female with smoldering multiple myeloma.  SPEP on 06/21/2017 revealed a 1.2 gm/dL monoclonal spike.  Random urine revealed 4.6 mg/dL protein and a 40.6% M-spike.  Calcium, albumin, and serum protein were normal.  Work-up on 06/30/2017 revealed a hematocrit of 31.1, hemoglobin 10.4, MCV 97.4, platelets 227,000, WBC 5700 with an ANC of 3300.  SPEP revealed a 1.1 gm/dL IgG monoclonal protein with lambda light chain specificity and monoclonal free lambda light chains (Bence-Jones protein).  IgG was 1660 ([phone]) and IgA was 48 (64-422).  Kappa free light chains were 23.7, lambda free light chains were 142.2 and ratio 0.17 (0.26-1.65).  Beta2-microglobulin was 4.7 (0.6-2.4).  Normal studies included:  B12, folate, and ferritin (75).  TSH was 0.204 (low).  24 hour UPEP revealed kappa free light chains 28.80 mg/L, lambda free light chains 63.80 mg/L and ration 0.45 (2.04 - 10.37).  M spike was 27.4% (16 mg/24 hours).  Bone survey on 06/30/2017 revealed no suspicious focal bony lesions.  Bone marrow aspirate and biopsy on 07/26/2017 revealed a slightly hypercellular marrow for age with trilineage hematopoiesis.  There was 13% plasmacytosis.  IHC stains for CD138 and in situ hybridization for kappa and lambda revealed interstitial cells and small clusters.  Lambda light chains appeared restricted.  Features were felt compatible with plasma cell neoplasm.  PET scan on 08/14/2017 revealed no hypermetabolic lesions of myeloma.  There was a 3.6 cm infrarenal aortic aneurysm.  Ultrasound in 2  years was recommended.  She had a 4 mm LLL nodule too small to characterize.  SPEP has been followed: 1.1 on 06/30/2017, 1.2 on 11/14/2017, 1.3 on 01/26/2018, 1.2 on 05/10/2018, 1.4 on 08/08/2018, 1.2 on 11/09/2018, and 1.4 on 02/11/2019.  IgG has been followed:  1660 on 06/30/2017, 1684 on 11/14/2017, 1795 on 01/26/2018, and 2054 on 08/08/2018.  Chest CT on 01/24/2018 revealed persistent LLL nodule.  Area calcified and felt to be consistent with a granuloma.  There were no other suspicious pulmonary nodules.  There was new Schmorl's node formation involving the inferior endplate of Z61.  There were stable small scattered osseous lesions related to underlying multiple myeloma.  PET scan on 05/15/2018 revealed no hypermetabolic lesions characteristic of active myeloma.  There was stable hypermetabolic apical wall of the left ventricle, there was potentially a small pseudoaneurysm in this vicinity, but no appreciable mass was noted on 01/24/2018.  There was an infrarenal abdominal aortic aneurysm, 3.6 cm in diameter. Follow-up ultrasound in 2 years was recommended.  Other imaging findings of potential clinical significance: aortic atherosclerosis and coronary atherosclerosis.  She has stage III chronic kidney disease (Cr 1.55; CrCl 32.3 ml/min).     She has a macrocytic anemia.  B12, folate, and TSH were normal on 05/10/2018.  Retic was 1.6%.  Ferritin and iron studies were normal on 05/10/2018.  Appetite remains poor.  Last colonoscopy was > 10 years ago (she declines repeat).  She has a 75 pack year smoking history.  She has COPD.  Chest CT without contrast on 07/25/2017 revealed no suspicious pulmonary nodules.  Symptomatically, she denies any interval infections, bone pain, fevers, sweats or weight loss.  Exam is stable.  Plan: 1.   Labs today: CBC with diff, CMP, SPEP, FLCA.  2.   Smoldering multiple myeloma Clinically, she is doing well. M spike is 1.4 g/dL. Lambda free light chains  228.9 (ratio 0.08). Continue every 3 month surveillance. 3.   Back and right hip pain PET scan on 05/15/2008 revealed no active myeloma. Patient's hip surgery postponed s/p cardiac evaluation. 4.   Renal insufficieny             Creatinine 1.52.             Patient is followed by Dr Holley Raring. 5.   Macrocytic anemia             Hematocrit 28.8.  Hemoglobin 9.2.  MCV 109.9 on 12/21/2018.    Hematocrit 31.3.  Hemoglobin 10.0.  MCV 107.2 on 02/11/2019.  B12, folate and TSH were normal on 05/10/2018.    She has no liver disease.    Etiology may be secondary to a myelodysplastic syndrome (MDS).  Bone marrow on 07/26/2017 revealed no evidence of MDS (MCV was normal).   Continue surveillance. 6.   RTC in 3 months for MD assessment and labs (CBC with diff, CMP, SPEP, FLCA).  I discussed the assessment and treatment plan with the patient.  The patient was provided an opportunity to ask questions and all were answered.  The patient agreed with the plan and demonstrated an understanding of the instructions.  The patient was advised to call back if the symptoms worsen or if the condition fails to improve as anticipated.  I provided 22 minutes (10:52 AM - 11:13 AM) of face-to-face time during this this encounter and > 50% was spent counseling as documented under my assessment and plan.    Lequita Asal, MD, PhD    02/11/2019, 11:13 AM  I, Jacqualyn Posey, am acting as a Education administrator for [employer]. Mike Gip, MD.   I, Melissa C. Mike Gip, MD, have reviewed the above documentation for accuracy and completeness, and I agree with the above.

## 2019-02-11 ENCOUNTER — Inpatient Hospital Stay: Payer: Medicare Other

## 2019-02-11 ENCOUNTER — Other Ambulatory Visit: Payer: Self-pay

## 2019-02-11 ENCOUNTER — Inpatient Hospital Stay: Payer: Medicare Other | Attending: Hematology and Oncology | Admitting: Hematology and Oncology

## 2019-02-11 VITALS — BP 131/75 | HR 60 | Temp 97.4°F | Resp 18 | Ht 65.5 in | Wt 175.5 lb

## 2019-02-11 DIAGNOSIS — E1151 Type 2 diabetes mellitus with diabetic peripheral angiopathy without gangrene: Secondary | ICD-10-CM | POA: Diagnosis not present

## 2019-02-11 DIAGNOSIS — F17210 Nicotine dependence, cigarettes, uncomplicated: Secondary | ICD-10-CM | POA: Insufficient documentation

## 2019-02-11 DIAGNOSIS — I130 Hypertensive heart and chronic kidney disease with heart failure and stage 1 through stage 4 chronic kidney disease, or unspecified chronic kidney disease: Secondary | ICD-10-CM | POA: Insufficient documentation

## 2019-02-11 DIAGNOSIS — I252 Old myocardial infarction: Secondary | ICD-10-CM | POA: Diagnosis not present

## 2019-02-11 DIAGNOSIS — I509 Heart failure, unspecified: Secondary | ICD-10-CM | POA: Insufficient documentation

## 2019-02-11 DIAGNOSIS — E1122 Type 2 diabetes mellitus with diabetic chronic kidney disease: Secondary | ICD-10-CM | POA: Insufficient documentation

## 2019-02-11 DIAGNOSIS — C9000 Multiple myeloma not having achieved remission: Secondary | ICD-10-CM

## 2019-02-11 DIAGNOSIS — Z79899 Other long term (current) drug therapy: Secondary | ICD-10-CM | POA: Insufficient documentation

## 2019-02-11 DIAGNOSIS — E039 Hypothyroidism, unspecified: Secondary | ICD-10-CM | POA: Insufficient documentation

## 2019-02-11 DIAGNOSIS — N183 Chronic kidney disease, stage 3 unspecified: Secondary | ICD-10-CM | POA: Diagnosis not present

## 2019-02-11 DIAGNOSIS — Z7982 Long term (current) use of aspirin: Secondary | ICD-10-CM | POA: Diagnosis not present

## 2019-02-11 DIAGNOSIS — I779 Disorder of arteries and arterioles, unspecified: Secondary | ICD-10-CM | POA: Diagnosis not present

## 2019-02-11 DIAGNOSIS — D539 Nutritional anemia, unspecified: Secondary | ICD-10-CM

## 2019-02-11 DIAGNOSIS — Z8673 Personal history of transient ischemic attack (TIA), and cerebral infarction without residual deficits: Secondary | ICD-10-CM | POA: Diagnosis not present

## 2019-02-11 DIAGNOSIS — D472 Monoclonal gammopathy: Secondary | ICD-10-CM

## 2019-02-11 DIAGNOSIS — E1136 Type 2 diabetes mellitus with diabetic cataract: Secondary | ICD-10-CM | POA: Diagnosis not present

## 2019-02-11 DIAGNOSIS — J449 Chronic obstructive pulmonary disease, unspecified: Secondary | ICD-10-CM | POA: Insufficient documentation

## 2019-02-11 DIAGNOSIS — E785 Hyperlipidemia, unspecified: Secondary | ICD-10-CM | POA: Insufficient documentation

## 2019-02-11 DIAGNOSIS — M25551 Pain in right hip: Secondary | ICD-10-CM | POA: Insufficient documentation

## 2019-02-11 LAB — COMPREHENSIVE METABOLIC PANEL
ALT: 14 U/L (ref 0–44)
AST: 21 U/L (ref 15–41)
Albumin: 4.1 g/dL (ref 3.5–5.0)
Alkaline Phosphatase: 64 U/L (ref 38–126)
Anion gap: 6 (ref 5–15)
BUN: 31 mg/dL — ABNORMAL HIGH (ref 8–23)
CO2: 22 mmol/L (ref 22–32)
Calcium: 8.8 mg/dL — ABNORMAL LOW (ref 8.9–10.3)
Chloride: 106 mmol/L (ref 98–111)
Creatinine, Ser: 1.52 mg/dL — ABNORMAL HIGH (ref 0.44–1.00)
GFR calc Af Amer: 38 mL/min — ABNORMAL LOW (ref >60)
GFR calc non Af Amer: 33 mL/min — ABNORMAL LOW (ref >60)
Glucose, Bld: 106 mg/dL — ABNORMAL HIGH (ref 70–99)
Potassium: 4.1 mmol/L (ref 3.5–5.1)
Sodium: 134 mmol/L — ABNORMAL LOW (ref 135–145)
Total Bilirubin: 0.2 mg/dL — ABNORMAL LOW (ref 0.3–1.2)
Total Protein: 8.4 g/dL — ABNORMAL HIGH (ref 6.5–8.1)

## 2019-02-11 LAB — CBC WITH DIFFERENTIAL/PLATELET
Abs Immature Granulocytes: 0.01 10*3/uL (ref 0.00–0.07)
Basophils Absolute: 0 10*3/uL (ref 0.0–0.1)
Basophils Relative: 1 %
Eosinophils Absolute: 0.3 10*3/uL (ref 0.0–0.5)
Eosinophils Relative: 5 %
HCT: 31.3 % — ABNORMAL LOW (ref 36.0–46.0)
Hemoglobin: 10 g/dL — ABNORMAL LOW (ref 12.0–15.0)
Immature Granulocytes: 0 %
Lymphocytes Relative: 23 %
Lymphs Abs: 1.3 10*3/uL (ref 0.7–4.0)
MCH: 34.2 pg — ABNORMAL HIGH (ref 26.0–34.0)
MCHC: 31.9 g/dL (ref 30.0–36.0)
MCV: 107.2 fL — ABNORMAL HIGH (ref 80.0–100.0)
Monocytes Absolute: 0.5 10*3/uL (ref 0.1–1.0)
Monocytes Relative: 9 %
Neutro Abs: 3.6 10*3/uL (ref 1.7–7.7)
Neutrophils Relative %: 62 %
Platelets: 213 10*3/uL (ref 150–400)
RBC: 2.92 MIL/uL — ABNORMAL LOW (ref 3.87–5.11)
RDW: 13.5 % (ref 11.5–15.5)
WBC: 5.8 10*3/uL (ref 4.0–10.5)
nRBC: 0 % (ref 0.0–0.2)

## 2019-02-12 LAB — PROTEIN ELECTROPHORESIS, SERUM
A/G Ratio: 1 (ref 0.7–1.7)
Albumin ELP: 3.8 g/dL (ref 2.9–4.4)
Alpha-1-Globulin: 0.3 g/dL (ref 0.0–0.4)
Alpha-2-Globulin: 1.1 g/dL — ABNORMAL HIGH (ref 0.4–1.0)
Beta Globulin: 0.8 g/dL (ref 0.7–1.3)
Gamma Globulin: 1.6 g/dL (ref 0.4–1.8)
Globulin, Total: 3.8 g/dL (ref 2.2–3.9)
M-Spike, %: 1.4 g/dL — ABNORMAL HIGH
Total Protein ELP: 7.6 g/dL (ref 6.0–8.5)

## 2019-02-12 LAB — KAPPA/LAMBDA LIGHT CHAINS
Kappa free light chain: 18.1 mg/L (ref 3.3–19.4)
Kappa, lambda light chain ratio: 0.08 — ABNORMAL LOW (ref 0.26–1.65)
Lambda free light chains: 228.9 mg/L — ABNORMAL HIGH (ref 5.7–26.3)

## 2019-02-27 DIAGNOSIS — M25551 Pain in right hip: Secondary | ICD-10-CM | POA: Diagnosis not present

## 2019-03-25 ENCOUNTER — Ambulatory Visit: Payer: Self-pay

## 2019-03-25 NOTE — Telephone Encounter (Signed)
Pt. Reports she was exposed to COVID 19 Thanksgiving day - grandson tested positive. Asking for information on testing. Has no symptoms. Given testing site information. Offered a virtual visit. States she does not need that at this time.  Answer Assessment - Initial Assessment Questions 1. COVID-19 CLOSE CONTACT: "Who is the person with the confirmed or suspected COVID-19 infection that you were exposed to?"     Family 2. PLACE of CONTACT: "Where were you when you were exposed to COVID-19?" (e.g., home, school, medical waiting room; which city?)     Home 3. TYPE of CONTACT: "How much contact was there?" (e.g., sitting next to, live in same house, work in same office, same building)     Home 4. DURATION of CONTACT: "How long were you in contact with the COVID-19 patient?" (e.g., a few seconds, passed by person, a few minutes, 15 minutes or longer, live with the patient)     Thanksgiving day 5. MASK: "Were you wearing a mask?" "Was the other person wearing a mask?" Note: wearing a mask reduces the risk of an  otherwise close contact.     No masks 6. DATE of CONTACT: "When did you have contact with a COVID-19 patient?" (e.g., how many days ago)     Thanksgiving 7. COMMUNITY SPREAD: "Are there lots of cases of COVID-19 (community spread) where you live?" (See public health department website, if unsure)       Yes 8. SYMPTOMS: "Do you have any symptoms?" (e.g., fever, cough, breathing difficulty, loss of taste or smell)     No symptoms 9. PREGNANCY OR POSTPARTUM: "Is there any chance you are pregnant?" "When was your last menstrual period?" "Did you deliver in the last 2 weeks?"     No 10. HIGH RISK: "Do you have any heart or lung problems? Do you have a weak immune system?" (e.g., heart failure, COPD, asthma, HIV positive, chemotherapy, renal failure, diabetes mellitus, sickle cell anemia, obesity)       Yes 11.  TRAVEL: "Have you traveled out of the country recently?" If so, "When and where?"   Also ask about out-of-state travel, since the CDC has identified some high-risk cities for community spread in the [place].  Note: Travel becomes less relevant if there is widespread community transmission where the patient lives.       No  Protocols used: CORONAVIRUS (COVID-19) EXPOSURE-A-AH

## 2019-03-27 ENCOUNTER — Other Ambulatory Visit: Payer: Self-pay

## 2019-03-27 DIAGNOSIS — Z20822 Contact with and (suspected) exposure to covid-19: Secondary | ICD-10-CM

## 2019-03-27 DIAGNOSIS — Z20828 Contact with and (suspected) exposure to other viral communicable diseases: Secondary | ICD-10-CM | POA: Diagnosis not present

## 2019-03-28 ENCOUNTER — Ambulatory Visit: Payer: Medicare Other | Admitting: Podiatry

## 2019-03-29 LAB — NOVEL CORONAVIRUS, NAA: SARS-CoV-2, NAA: DETECTED — AB

## 2019-03-30 ENCOUNTER — Telehealth: Payer: Self-pay | Admitting: Nurse Practitioner

## 2019-03-30 ENCOUNTER — Encounter: Payer: Self-pay | Admitting: Hematology and Oncology

## 2019-03-30 NOTE — Telephone Encounter (Signed)
  Thanks for the information.  I will talk to her on Monday.  Melissa

## 2019-03-30 NOTE — Telephone Encounter (Signed)
Discussed further monoclonal antibody infusion for Covid positive high risk patients with Ms. Riga.  She wishes to further discuss with her oncologist, Dr. Mike Gip and plans to call Monday.  If she wishes transfusions she will reach out to writer to schedule.

## 2019-03-30 NOTE — Telephone Encounter (Signed)
  I connected by phone with [name] on 03/30/2019 at 10:28 AM to discuss the potential use of an new treatment for mild to moderate COVID-19 viral infection in non-hospitalized patients.  This patient is a 78 y.o. female that meets the FDA criteria for Emergency Use Authorization of bamlanivimab or casirivimab\imdevimab.  Has a (+) direct SARS-CoV-2 viral test result  Has mild or moderate COVID-19 -- current symptoms include cough and mild SOB  Is ? 78 years of age and weighs ? 40 kg  Is NOT hospitalized due to COVID-19  Is NOT requiring oxygen therapy or requiring an increase in baseline oxygen flow rate due to COVID-19  Is within 10 days of symptom onset  Has at least one of the high risk factor(s) for progression to severe COVID-19 and/or hospitalization as defined in EUA.  Specific high risk criteria : >/= 77 yo with COPD, HTN, CAD  Reviewed patient chronic problem list, which is currently managed by her PCP.  I have spoken and communicated the following to the patient or parent/caregiver:  1. FDA has authorized the emergency use of bamlanivimab for the treatment of mild to moderate COVID-19 in adults and pediatric patients with positive results of direct SARS-CoV-2 viral testing who are 35 years of age and older weighing at least 40 kg, and who are at high risk for progressing to severe COVID-19 and/or hospitalization.  2. The significant known and potential risks and benefits of bamlanivimab, and the extent to which such potential risks and benefits are unknown.  3. Information on available alternative treatments and the risks and benefits of those alternatives, including clinical trials.  4. Patients treated with bamlanivimab should continue to self-isolate and use infection control measures (e.g., wear mask, isolate, social distance, avoid sharing personal items, clean and disinfect "high touch" surfaces, and frequent handwashing) according to CDC guidelines.   5. The  patient or parent/caregiver has the option to accept or refuse bamlanivimab.  After reviewing this information with the patient she wishes to further discuss with her daughter and will call provider back to alert her to decision.    Barbaraann Faster Bralyn Folkert 03/30/2019 10:28 AM

## 2019-04-02 ENCOUNTER — Encounter: Payer: Self-pay | Admitting: Hematology and Oncology

## 2019-04-04 ENCOUNTER — Telehealth: Payer: Self-pay | Admitting: Hematology and Oncology

## 2019-04-04 NOTE — Telephone Encounter (Signed)
Re:  COVID-19  Patient called about testing + for COVID-19 on 03/27/2019.  She has no symptoms. She was exposed on Thanksgiving by her grandson.  We discussed no plan for monoclonal antibody as she is > 48-72 hours since testing + and has no symptoms.  We discussed the potential for vaccine in the future (3 months).   Lequita Asal, MD

## 2019-04-08 ENCOUNTER — Other Ambulatory Visit: Payer: Self-pay

## 2019-04-08 ENCOUNTER — Encounter: Payer: Self-pay | Admitting: Podiatry

## 2019-04-08 ENCOUNTER — Ambulatory Visit (INDEPENDENT_AMBULATORY_CARE_PROVIDER_SITE_OTHER): Payer: Medicare Other | Admitting: Podiatry

## 2019-04-08 DIAGNOSIS — L84 Corns and callosities: Secondary | ICD-10-CM | POA: Insufficient documentation

## 2019-04-08 DIAGNOSIS — B351 Tinea unguium: Secondary | ICD-10-CM

## 2019-04-08 DIAGNOSIS — M79674 Pain in right toe(s): Secondary | ICD-10-CM

## 2019-04-08 DIAGNOSIS — M79675 Pain in left toe(s): Secondary | ICD-10-CM | POA: Diagnosis not present

## 2019-04-08 NOTE — Progress Notes (Signed)
Complaint:  Visit Type: Patient returns to my office for continued preventative foot care services. Complaint: Patient states" my nails have grown long and thick and become painful to walk and wear shoes" Patient has been diagnosed with DM with neuropathy. The patient presents for preventative foot care services. No changes to ROS  Podiatric Exam: Vascular: dorsalis pedis and posterior tibial pulses are palpable bilateral. Capillary return is immediate. Temperature gradient is WNL. Skin turgor WNL  Sensorium: Diminished  Semmes Weinstein monofilament test. Normal tactile sensation bilaterally. Nail Exam: Pt has thick disfigured discolored nails with subungual debris noted bilateral entire nail hallux through fifth toenails Ulcer Exam: There is no evidence of ulcer or pre-ulcerative changes or infection. Orthopedic Exam: Muscle tone and strength are WNL. No limitations in general ROM. No crepitus or effusions noted. Foot type and digits show no abnormalities.HAV with overlapping 2 nd  B/L.  PTTD right foot.  DJD rearfoot  Right foot. Skin: No Porokeratosis. No infection or ulcers.  Pinch callus  B/L asymptomatic.  Diagnosis:  Onychomycosis, , Pain in right toe, pain in left toes    Treatment & Plan Procedures and Treatment: Consent by patient was obtained for treatment procedures.   Debridement of mycotic and hypertrophic toenails, 1 through 5 bilateral and clearing of subungual debris. No ulceration, no infection noted.  Return Visit-Office Procedure: Patient instructed to return to the office for a follow up visit 3 months for continued evaluation and treatment.    Gardiner Barefoot DPM

## 2019-04-11 ENCOUNTER — Other Ambulatory Visit: Payer: Self-pay

## 2019-04-11 ENCOUNTER — Ambulatory Visit (INDEPENDENT_AMBULATORY_CARE_PROVIDER_SITE_OTHER): Payer: Medicare Other | Admitting: Nurse Practitioner

## 2019-04-11 ENCOUNTER — Encounter: Payer: Self-pay | Admitting: Nurse Practitioner

## 2019-04-11 DIAGNOSIS — U071 COVID-19: Secondary | ICD-10-CM

## 2019-04-11 DIAGNOSIS — Z8616 Personal history of COVID-19: Secondary | ICD-10-CM | POA: Insufficient documentation

## 2019-04-11 DIAGNOSIS — I779 Disorder of arteries and arterioles, unspecified: Secondary | ICD-10-CM

## 2019-04-11 NOTE — Assessment & Plan Note (Signed)
Tested positive 03/27/2019 without any symptoms during past 15 days.  Is now off self quarantine.  Is aware to continue to follow 3 W's.  To return to office in 3 months for chronic disease follow-up.

## 2019-04-11 NOTE — Progress Notes (Signed)
BP 132/76   Pulse (!) 59   Temp (!) 97.1 F (36.2 C) (Oral)    Subjective:    Patient ID: Bailey Ayala, female    DOB: 1940/12/17, 78 y.o.   MRN: [id number]  HPI: Bailey Ayala is a 78 y.o. female  Chief Complaint  Patient presents with  . COVID    tested positive 03/27/19, no symptoms     . This visit was completed via telephone due to the restrictions of the COVID-19 pandemic. All issues as above were discussed and addressed but no physical exam was performed. If it was felt that the patient should be evaluated in the office, they were directed there. The patient verbally consented to this visit. Patient was unable to complete an audio/visual visit due to Lack of equipment. Due to the catastrophic nature of the COVID-19 pandemic, this visit was done through audio contact only. . Location of the patient: home . Location of the provider: home . Those involved with this call:  . Provider: Marnee Guarneri, DNP . CMA: Yvonna Alanis, CMA . Front Desk/Registration: Jill Side  . Time spent on call: 15 minutes on the phone discussing health concerns. 10 minutes total spent in review of patient's record and preparation of their chart.  . I verified patient identity using two factors (patient name and date of birth). Patient consents verbally to being seen via telemedicine visit today.    COVID FOLLOW-UP She tested positive on 03/27/2019, did not receive monoclonal antibody infusion.  Decided against this after discussion with her oncologist.  She never had any symptoms.  She believes she got from her grandson, at Thanksgiving dinner with family, found out that night he had tested positive.  Had not told anyone at dinner he has been tested. Fever: no Cough: no Shortness of breath: no Wheezing: no Chest pain: no Chest tightness: no Chest congestion: no Nasal congestion: no Runny nose: no Post nasal drip: no Sneezing: no Sore throat: no Swollen glands: no Sinus  pressure: no Headache: no Face pain: no Toothache: no Eyes red/itching:no Eye drainage/crusting: no  Vomiting: no Rash: no Fatigue: no Sick contacts: grandson  Relevant past medical, surgical, family and social history reviewed and updated as indicated. Interim medical history since our last visit reviewed. Allergies and medications reviewed and updated.  Review of Systems  Constitutional: Negative for activity change, appetite change, diaphoresis, fatigue and fever.  HENT: Negative.   Respiratory: Negative for cough, chest tightness and shortness of breath.   Cardiovascular: Negative for chest pain, palpitations and leg swelling.  Gastrointestinal: Negative for abdominal distention, abdominal pain, constipation, diarrhea, nausea and vomiting.  Neurological: Negative for dizziness, syncope, weakness, light-headedness, numbness and headaches.  Psychiatric/Behavioral: Negative.     Per HPI unless specifically indicated above     Objective:    BP 132/76   Pulse (!) 59   Temp (!) 97.1 F (36.2 C) (Oral)   Wt Readings from Last 3 Encounters:  02/11/19 175 lb 7.8 oz (79.6 kg)  02/06/19 170 lb (77.1 kg)  11/30/18 170 lb (77.1 kg)    Physical Exam   Unable to perform due to telephone visit only.  Results for orders placed or performed in visit on 03/27/19  Novel Coronavirus, NAA ([facility])   Specimen: Nasopharyngeal(NP) swabs in vial transport medium   NASOPHARYNGE  TESTING  Result Value Ref Range   SARS-CoV-2, NAA Detected (A) Not Detected      Assessment & Plan:   Problem List Items Addressed  This Visit      Other   Lab test positive for detection of COVID-19 virus    Tested positive 03/27/2019 without any symptoms during past 15 days.  Is now off self quarantine.  Is aware to continue to follow 3 W's.  To return to office in 3 months for chronic disease follow-up.         I discussed the assessment and treatment plan with the patient. The patient was provided an  opportunity to ask questions and all were answered. The patient agreed with the plan and demonstrated an understanding of the instructions.   The patient was advised to call back or seek an in-person evaluation if the symptoms worsen or if the condition fails to improve as anticipated.   I provided 15 minutes of time during this encounter.  Follow up plan: Return in about 3 months (around 07/10/2019) for HTN/HLD, Hypothyroid, Chronic broncitis, and MM.

## 2019-04-11 NOTE — Patient Instructions (Signed)

## 2019-04-17 ENCOUNTER — Telehealth: Payer: Self-pay | Admitting: Cardiovascular Disease

## 2019-04-17 NOTE — Telephone Encounter (Signed)
   [facility] Pre-operative Risk Assessment    Request for surgical clearance:  1. What type of surgery is being performed? R Total Hip Arthroplasty    2. When is this surgery scheduled? 06/11/19   3. What type of clearance is required (medical clearance vs. Pharmacy clearance to hold med vs. Both)? Both  4. Are there any medications that need to be held prior to surgery and how long?please advise    5. Practice name and name of physician performing surgery? [facility] Dr. Alvan Dame   6. What is your office phone number [phone]    7.   What is your office fax number [phone]  8.   Anesthesia type (None, local, MAC, general) ? spinal   Clarisse Gouge 04/17/2019, 3:37 PM  _________________________________________________________________   (provider comments below)

## 2019-04-17 NOTE — Telephone Encounter (Signed)
Pt was previously cleared for surgery on 11/30/2018. She has chronically occluded LAD with left to left collaterals. Overall, she has at least moderate risk for noncardiac surgery.  No further testing or intervention will reduce this risk at this time.  Recommend aspirin be continued throughout the perioperative timeframe if possible.   Per chart review, since then she has had some episodes of SVT noted on home monitor and metoprolol was increased. This would not preclude surgery.  I attempted to call pt to update status and clear if doing well. Left VM to call back to preop clinic.   The patient also has an appt with Dr. Fletcher Anon on 1/12, before her scheduled surgery on 2/16, so if she has any issues, clearance could be addressed at that visit.

## 2019-04-17 NOTE — Telephone Encounter (Signed)
I have made a note needing surgery clearance on appt notes. I will remove from the pre op call back pool.

## 2019-04-17 NOTE — Telephone Encounter (Signed)
Ms. Pfefferle returned my call to discuss clearance for surgery. She was planning to address clearance at her appt with Dr. Fletcher Anon on 1/12 and would like to wait until then. She has some things to discuss with Dr. Fletcher Anon.  I will route this clearance request to Dr. Fletcher Anon.  Pre-op callback, please place preop clearance on appointment note for 1/12.

## 2019-04-23 ENCOUNTER — Other Ambulatory Visit: Payer: Self-pay

## 2019-04-23 ENCOUNTER — Emergency Department
Admission: EM | Admit: 2019-04-23 | Discharge: 2019-04-23 | Disposition: A | Discharge status: Home / Self Care | Payer: Medicare Other | Attending: Student in an Organized Health Care Education/Training Program | Admitting: Student in an Organized Health Care Education/Training Program

## 2019-04-23 ENCOUNTER — Encounter: Payer: Self-pay | Admitting: Emergency Medicine

## 2019-04-23 DIAGNOSIS — Z8673 Personal history of transient ischemic attack (TIA), and cerebral infarction without residual deficits: Secondary | ICD-10-CM | POA: Diagnosis not present

## 2019-04-23 DIAGNOSIS — Z87891 Personal history of nicotine dependence: Secondary | ICD-10-CM | POA: Diagnosis not present

## 2019-04-23 DIAGNOSIS — Y33XXXA Other specified events, undetermined intent, initial encounter: Secondary | ICD-10-CM | POA: Diagnosis not present

## 2019-04-23 DIAGNOSIS — I130 Hypertensive heart and chronic kidney disease with heart failure and stage 1 through stage 4 chronic kidney disease, or unspecified chronic kidney disease: Secondary | ICD-10-CM | POA: Diagnosis not present

## 2019-04-23 DIAGNOSIS — N183 Chronic kidney disease, stage 3 unspecified: Secondary | ICD-10-CM | POA: Insufficient documentation

## 2019-04-23 DIAGNOSIS — S81801S Unspecified open wound, right lower leg, sequela: Secondary | ICD-10-CM | POA: Insufficient documentation

## 2019-04-23 DIAGNOSIS — Z5189 Encounter for other specified aftercare: Secondary | ICD-10-CM | POA: Diagnosis present

## 2019-04-23 DIAGNOSIS — Z79899 Other long term (current) drug therapy: Secondary | ICD-10-CM | POA: Insufficient documentation

## 2019-04-23 DIAGNOSIS — I509 Heart failure, unspecified: Secondary | ICD-10-CM | POA: Insufficient documentation

## 2019-04-23 DIAGNOSIS — Z7982 Long term (current) use of aspirin: Secondary | ICD-10-CM | POA: Diagnosis not present

## 2019-04-23 DIAGNOSIS — E039 Hypothyroidism, unspecified: Secondary | ICD-10-CM | POA: Insufficient documentation

## 2019-04-23 NOTE — ED Triage Notes (Signed)
Pt here with c/o itching an area on her right ankle on Christmas night, states she burst a varicose vein open, "blood was all over my house." EMS came out to her house, bleeding was controlled at that time, pt opted not to come in. Went to [facility] today to have area checked to make sure everything was okay, [facility] sent her here. No bleeding noted, area with healed tiny scab intact, pt denies pain, states ankle "feels funny," right ankle with swelling noted. Denies any other complaints.

## 2019-04-23 NOTE — ED Provider Notes (Signed)
[facility] Emergency Department Provider Note  ____________________________________________  Time seen: Approximately 7:18 PM  I have reviewed the triage vital signs and the nursing notes.   HISTORY  Chief Complaint Wound Check    HPI Bailey Ayala is a 78 y.o. female who presents the emergency department for wound check.  Patient had a varicose vein that bled on Christmas which was 4 days ago.  Patient was scratching her lower extremity, open a varicose vein and it bled heavily.  She called EMS but was able to stop the bleeding prior to their arrival.  She refused transport to the hospital at that time and states that she has had no difficulty with the wound since.  It was recommended that she follow-up so she presented to urgent care today.  She states that she has chronic deformities in her ankle and urgent care thought "it could be infected."  Patient has had no erythema to the area.  No increased edema to the area.  Patient has had no return of bleeding.  No other complaints at this time.         Past Medical History:  Diagnosis Date  . Allergy 1980  . Anemia 09-2017  . Arthritis   . Blood dyscrasia    BLEEDS EASILY   . Blood transfusion without reported diagnosis 2011 or 12  . Cataract ?  . CHF (congestive heart failure) ([place]) ?  Marland Kitchen Clotting disorder ([place]) ?  Marland Kitchen COPD (chronic obstructive pulmonary disease) ([place])   . Diabetes mellitus without complication ([place])   . Diffuse cystic mastopathy   . Emphysema of lung ([place]) ?  Marland Kitchen Hyperlipidemia   . Hypertension   . Hypothyroidism   . L4-L5 disc bulge   . Multiple myeloma ([place])   . Myocardial infarction ([place]) 2019  . Osteoporosis, post-menopausal   . PAD (peripheral artery disease) ([place])   . Renal insufficiency   . Shortness of breath dyspnea    WITH EXERTION   . Stroke ([place])    TIA     2016     WAS FOUND ON SCAN ORDERED FROM NEUROL  . Thyroid disease   . TIA (transient ischemic attack) 2016    left hand weakness    Patient Active Problem List   Diagnosis Date Noted  . Lab test positive for detection of COVID-19 virus 04/11/2019  . Skin callus 04/08/2019  . Anemia in chronic kidney disease 01/18/2019  . Chronic kidney disease, stage III (moderate) 01/18/2019  . Multiple myeloma ([place]) 01/18/2019  . History of CVA (cerebrovascular accident) 11/28/2018  . CAD (coronary artery disease) 11/28/2018  . Facial droop 11/28/2018  . Abnormal stress test   . Pain due to onychomycosis of toenails of both feet 10/18/2018  . Osteoarthritis of knee 07/16/2018  . Chronic toe ulcer, left, limited to breakdown of skin ([place]) 06/28/2018  . Neck pain 09/05/2017  . Left lower lobe pulmonary nodule 08/18/2017  . Abdominal aortic aneurysm (AAA) without rupture ([place]) 08/18/2017  . Goals of care, counseling/discussion 08/18/2017  . Weight loss 07/01/2017  . Smoldering multiple myeloma ([place]) 06/30/2017  . Macrocytic anemia 06/30/2017  . Benign hypertensive heart and CKD, stage 3 (GFR 30-59), w CHF ([place]) 05/29/2017  . Drug-induced constipation 11/07/2016  . Primary osteoarthritis involving multiple joints 11/07/2016  . Simple chronic bronchitis ([place]) 11/07/2016  . Status post total shoulder arthroplasty 11/03/2016  . Advanced care planning/counseling discussion 11/02/2016  . History of total knee arthroplasty 08/04/2016  . Localized, primary  osteoarthritis of shoulder region 08/04/2016  . S/P shoulder replacement 01/14/2016  . Hypothyroid 11/04/2014  . Hyperlipidemia   . Hypertension   . Varicose vein of leg 09/30/2014  . Peripheral arterial occlusive disease ([place]) 09/30/2014    Past Surgical History:  Procedure Laterality Date  . ABDOMINAL HYSTERECTOMY  1990  . BACK SURGERY  2011  . BREAST EXCISIONAL BIOPSY Left    2010?   Marland Kitchen BREAST SURGERY  1975   A lump removed left breast  . CATARACT EXTRACTION, BILATERAL    . COLONOSCOPY  1998  . EYE SURGERY Bilateral 05/06/14  . JOINT  REPLACEMENT     LT KNEE  . KNEE SURGERY Left 2012  . LEFT HEART CATH AND CORONARY ANGIOGRAPHY Left 11/19/2018   Procedure: LEFT HEART CATH AND CORONARY ANGIOGRAPHY;  Surgeon: Wellington Hampshire, MD;  Location: [facility];  Service: Cardiovascular;  Laterality: Left;  . OOPHORECTOMY  1990  . parathyroid transplant     2/3 THYROID REMOVED   ([place])  . SPINE SURGERY    . THYROID SURGERY    . TONSILLECTOMY    . TOTAL SHOULDER ARTHROPLASTY Left 01/14/2016   Procedure: LEFT TOTAL SHOULDER ARTHROPLASTY;  Surgeon: Justice Britain, MD;  Location: [facility];  Service: Orthopedics;  Laterality: Left;  . TOTAL SHOULDER ARTHROPLASTY Right 11/03/2016   Procedure: RIGHT TOTAL SHOULDER ARTHROPLASTY;  Surgeon: Justice Britain, MD;  Location: [facility];  Service: Orthopedics;  Laterality: Right;  . ULNAR NERVE REPAIR     LEFT  . vein closure procedure Left 2010    Prior to Admission medications   Medication Sig Start Date End Date Taking? Authorizing Provider  acetaminophen (TYLENOL) 500 MG tablet Take 500 mg by mouth every 6 (six) hours as needed.    [provider]  aspirin EC 81 MG tablet Take 1 tablet (81 mg total) by mouth daily. 08/05/16   Wellington Hampshire, MD  atorvastatin (LIPITOR) 40 MG tablet Take 1 tablet (40 mg total) by mouth daily. 12/05/18 03/05/19  Rise Mu, PA-C  benazepril (LOTENSIN) 20 MG tablet Take 1 tablet (20 mg total) by mouth daily. 06/28/18   Guadalupe Maple, MD  ferrous sulfate 325 (65 FE) MG tablet Take 325 mg by mouth daily with breakfast.    [provider]  levothyroxine (SYNTHROID, LEVOTHROID) 100 MCG tablet Take 1 tablet (100 mcg total) by mouth daily. 06/28/18   Guadalupe Maple, MD  metoprolol tartrate (LOPRESSOR) 50 MG tablet Take 1 tablet (50 mg total) by mouth 2 (two) times daily. 12/26/18 12/26/19  Rise Mu, PA-C  Multiple Vitamin (MULTIVITAMIN WITH MINERALS) TABS tablet Take 1 tablet by mouth daily.    [provider]  triamcinolone cream  (KENALOG) 0.1 % Apply 1 application topically 2 (two) times daily. Patient not taking: Reported on 02/08/2019 12/28/17   Guadalupe Maple, MD    Allergies Codeine and Simvastatin  Family History  Problem Relation Age of Onset  . Heart disease Mother        MI  . Arthritis Mother   . Stroke Mother   . Hyperlipidemia Mother   . Hypertension Mother   . Varicose Veins Mother   . Heart disease Father        MI  . Hyperlipidemia Father   . Arthritis Father   . Cancer Father   . COPD Father   . Hearing loss Father   . Hypertension Father   . Vision loss Father   .  Diabetes Sister   . COPD Sister   . Heart disease Sister   . Hyperlipidemia Sister   . Hypertension Sister   . Hyperlipidemia Brother   . COPD Brother   . Diabetes Brother   . Heart disease Brother   . Hypertension Brother   . Arthritis Sister   . Hyperlipidemia Sister   . Hypertension Sister   . Vision loss Sister   . Varicose Veins Sister   . Arthritis Brother   . Varicose Veins Brother   . Arthritis Daughter   . Arthritis Daughter   . Diabetes Daughter   . Cancer Paternal Aunt   . COPD Sister   . Heart disease Sister   . Hyperlipidemia Sister   . Hypertension Sister   . Varicose Veins Sister   . Heart disease Maternal Uncle   . Heart disease Paternal Uncle     Social History Social History   Tobacco Use  . Smoking status: Former Smoker    Packs/day: 3.00    Years: 25.00    Pack years: 75.00    Types: Cigarettes    Quit date: 11/03/1989    Years since quitting: 29.4  . Smokeless tobacco: Never Used  Substance Use Topics  . Alcohol use: Not Currently    Alcohol/week: 0.0 standard drinks  . Drug use: No     Review of Systems  Constitutional: No fever/chills Eyes: No visual changes. No discharge ENT: No upper respiratory complaints. Cardiovascular: no chest pain.  Wound check from a bleeding varicose vein from 4 days ago Respiratory: no cough. No SOB. Gastrointestinal: No abdominal pain.   No nausea, no vomiting.  No diarrhea.  No constipation. Musculoskeletal: Negative for musculoskeletal pain. Skin: Negative for rash, abrasions, lacerations, ecchymosis. Neurological: Negative for headaches, focal weakness or numbness. 10-point ROS otherwise negative.  ____________________________________________   PHYSICAL EXAM:  VITAL SIGNS: ED Triage Vitals  Enc Vitals Group     BP 04/23/19 1800 (!) 171/78     Pulse Rate 04/23/19 1800 80     Resp 04/23/19 1800 18     Temp 04/23/19 1800 98.6 F (37 C)     Temp Source 04/23/19 1800 Oral     SpO2 04/23/19 1800 96 %     Weight 04/23/19 1800 175 lb (79.4 kg)     Height 04/23/19 1800 '5\' 5"'  (1.651 m)     Head Circumference --      Peak Flow --      Pain Score 04/23/19 1812 0     Pain Loc --      Pain Edu? --      Excl. in [place]? --      Constitutional: Alert and oriented. Well appearing and in no acute distress. Eyes: Conjunctivae are normal. PERRL. EOMI. Head: Atraumatic. ENT:      Ears:       Nose: No congestion/rhinnorhea.      Mouth/Throat: Mucous membranes are moist.  Neck: No stridor.    Cardiovascular: Normal rate, regular rhythm. Normal S1 and S2.  Good peripheral circulation. Respiratory: Normal respiratory effort without tachypnea or retractions. Lungs CTAB. Good air entry to the bases with no decreased or absent breath sounds. Musculoskeletal: Full range of motion to all extremities. No gross deformities appreciated.  Visualization of the right lower extremity reveals a scabbed lesion over a varicose vein.  No surrounding erythema, edema, ecchymosis.  Wound is healing appropriately.  Sensation and capillary refill intact to the extremity. Neurologic:  Normal speech and language.  No gross focal neurologic deficits are appreciated.  Skin:  Skin is warm, dry and intact. No rash noted. Psychiatric: Mood and affect are normal. Speech and behavior are normal. Patient exhibits appropriate insight and  judgement.   ____________________________________________   LABS (all labs ordered are listed, but only abnormal results are displayed)  Labs Reviewed - No data to display ____________________________________________  EKG   ____________________________________________  RADIOLOGY   No results found.  ____________________________________________    PROCEDURES  Procedure(s) performed:    Procedures    Medications - No data to display   ____________________________________________   INITIAL IMPRESSION / ASSESSMENT AND PLAN / ED COURSE  Pertinent labs & imaging results that were available during my care of the patient were reviewed by me and considered in my medical decision making (see chart for details).  Review of the [place] CSRS was performed in accordance of the [place] prior to dispensing any controlled drugs.           Patient's diagnosis is consistent with wound check.  Patient presents emergency department for evaluation of a varicose vein that had bled 4 days ago.  No return of bleeding.  Patient denies any complications but did follow-up with urgent care.  Urgent care sent the patient to the emergency department because they were concerned there was infection.  There is no evidence of infection.  No indication for labs, imaging or treatment at this time.  Wound care instructions discussed with the patient.  Referral to vascular surgery is provided to the patient.  Return precautions are discussed at length with the patient..  Patient is given ED precautions to return to the ED for any worsening or new symptoms.     ____________________________________________  FINAL CLINICAL IMPRESSION(S) / ED DIAGNOSES  Final diagnoses:  Visit for wound check      NEW MEDICATIONS STARTED DURING THIS VISIT:  ED Discharge Orders    None          This chart was dictated using voice recognition software/Dragon. Despite best efforts to proofread, errors can  occur which can change the meaning. Any change was purely unintentional.    Darletta Moll, PA-C 04/23/19 1930    Merlyn Lot, MD 04/23/19 [phone]

## 2019-04-24 ENCOUNTER — Ambulatory Visit: Payer: Medicare Other | Admitting: Family Medicine

## 2019-04-24 ENCOUNTER — Telehealth: Payer: Self-pay | Admitting: Cardiology

## 2019-04-24 NOTE — Telephone Encounter (Signed)
Patient states she spoke to you the other day about her clearance. She know she needs to speak to Dr. Fletcher Anon about some concerns she has about the virus, but she can't remember what they are. She is hoping you remember. She would like to speak to you.

## 2019-05-01 ENCOUNTER — Encounter: Payer: Self-pay | Admitting: Family Medicine

## 2019-05-01 ENCOUNTER — Other Ambulatory Visit: Payer: Self-pay

## 2019-05-01 ENCOUNTER — Ambulatory Visit (INDEPENDENT_AMBULATORY_CARE_PROVIDER_SITE_OTHER): Payer: Medicare Other | Admitting: Family Medicine

## 2019-05-01 VITALS — BP 146/74 | HR 62 | Temp 98.6°F | Ht 65.5 in

## 2019-05-01 DIAGNOSIS — I83899 Varicose veins of unspecified lower extremities with other complications: Secondary | ICD-10-CM

## 2019-05-01 NOTE — Progress Notes (Signed)
BP (!) 146/74   Pulse 62   Temp 98.6 F (37 C) (Oral)   Ht 5' 5.5" (1.664 m)   SpO2 95%   BMI 28.68 kg/m    Subjective:    Patient ID: Bailey Ayala, female    DOB: [birth year], 79 y.o.   MRN: [id number]  HPI: MERDITH Ayala is a 79 y.o. female  Chief Complaint  Patient presents with  . Wound Check    right ankle   Patient presenting today for recheck of a ruptured varicose vein on her right medial ankle. Had presented to ED for this issue as she was bleeding significantly and unable to easily control the bleeding with pressure at home. Has significant varicose veins in her lower legs, worse on the right. States she just scratched the skin on the area and that caused the skin to break over her varicose veins. Was found to be stable in ER and was d/c'd home with pressure dressing. Notes reviewed personally. Since this incident has had no bleeding, bruising, pain, drainage from area and states it has healed well without issue. ER provider generated referral to Vein Specialist for further eval. She states she used to be followed by specialist and has had vein procedures in the past.   Relevant past medical, surgical, family and social history reviewed and updated as indicated. Interim medical history since our last visit reviewed. Allergies and medications reviewed and updated.  Review of Systems  Per HPI unless specifically indicated above     Objective:    BP (!) 146/74   Pulse 62   Temp 98.6 F (37 C) (Oral)   Ht 5' 5.5" (1.664 m)   SpO2 95%   BMI 28.68 kg/m   Wt Readings from Last 3 Encounters:  04/23/19 175 lb (79.4 kg)  02/11/19 175 lb 7.8 oz (79.6 kg)  02/06/19 170 lb (77.1 kg)    Physical Exam Vitals and nursing note reviewed.  Constitutional:      Appearance: Normal appearance. She is not ill-appearing.  HENT:     Head: Atraumatic.  Eyes:     Extraocular Movements: Extraocular movements intact.     Conjunctiva/sclera: Conjunctivae normal.    Cardiovascular:     Rate and Rhythm: Normal rate and regular rhythm.     Heart sounds: Normal heart sounds.     Comments: Significant varicose veins b/l LEs Pulmonary:     Effort: Pulmonary effort is normal.     Breath sounds: Normal breath sounds.  Musculoskeletal:        General: Normal range of motion.     Cervical back: Normal range of motion and neck supple.  Skin:    General: Skin is warm and dry.  Neurological:     Mental Status: She is alert and oriented to person, place, and time.  Psychiatric:        Mood and Affect: Mood normal.        Thought Content: Thought content normal.        Judgment: Judgment normal.     Results for orders placed or performed in visit on 03/27/19  Novel Coronavirus, NAA ([facility])   Specimen: Nasopharyngeal(NP) swabs in vial transport medium   NASOPHARYNGE  TESTING  Result Value Ref Range   SARS-CoV-2, NAA Detected (A) Not Detected      Assessment & Plan:   Problem List Items Addressed This Visit    None    Visit Diagnoses    Ruptured varicose vein    -  Primary   Resolved now. Continue to monitor. Referral generated in ED to Vascular specialist for further mgmt of significant varicose veins       Follow up plan: Return for as scheduled.

## 2019-05-07 ENCOUNTER — Encounter: Payer: Self-pay | Admitting: Cardiovascular Disease

## 2019-05-07 ENCOUNTER — Ambulatory Visit (INDEPENDENT_AMBULATORY_CARE_PROVIDER_SITE_OTHER): Payer: Medicare Other | Admitting: Cardiovascular Disease

## 2019-05-07 ENCOUNTER — Other Ambulatory Visit: Payer: Self-pay

## 2019-05-07 VITALS — BP 146/64 | HR 60 | Ht 65.0 in | Wt 176.5 lb

## 2019-05-07 DIAGNOSIS — I2510 Atherosclerotic heart disease of native coronary artery without angina pectoris: Secondary | ICD-10-CM

## 2019-05-07 DIAGNOSIS — I714 Abdominal aortic aneurysm, without rupture, unspecified: Secondary | ICD-10-CM

## 2019-05-07 DIAGNOSIS — I739 Peripheral vascular disease, unspecified: Secondary | ICD-10-CM

## 2019-05-07 DIAGNOSIS — I10 Essential (primary) hypertension: Secondary | ICD-10-CM | POA: Diagnosis not present

## 2019-05-07 NOTE — Progress Notes (Signed)
Cardiology Office Note   Date:  05/07/2019   ID:  VANILLA HEATHERINGTON, DOB 26-Jun-1940, MRN [id number]  PCP:  Guadalupe Maple, MD  Cardiologist:   Kathlyn Sacramento, MD   Chief Complaint  Patient presents with  . OTHER    Cardiac clearance total hip replacement pt mentioned that they have cancelled her surgery. Meds reviewed verbally with pt.      History of Present Illness: Bailey Ayala is a 79 y.o. female who presents for a follow-up visit regarding coronary artery disease and peripheral arterial disease. She has chronic medical conditions that include peripheral arterial disease with intermittent claudication, previous tobacco use, hyperlipidemia, COPD and hypertension. The patient has known history of bilateral calf claudication due to SFA disease. ABI in 2017 was 0.57 on the right and 0.49 on the left. Duplex showed occlusion of the proximal to mid left SFA and borderline significant right SFA disease. The patient developed a rash with cilostazol which was discontinued.  She has smoldering multiple myeloma which is being monitored by oncology.  She has not required treatment yet. Repeat vascular studies in 2019 showed improvement in ABIs to 0.93 on the right and 0.64 on the left.  Echocardiogram in October 2019 showed normal LV systolic function.  She was seen last year for preoperative cardiovascular evaluation before planned hip surgery.  She underwent a Lexiscan Myoview which showed severe anterior ischemia.  Cardiac catheterization was done in July which showed severe one-vessel coronary artery disease with chronically occluded mid LAD with left to left collaterals, moderate RCA disease and mild left circumflex disease.  The coronary arteries were noted to be moderately to severely calcified.  EF was 45 to 50% with mildly elevated left ventricular end-diastolic pressure.  The patient was treated medically and metoprolol was added.  She was subsequently seen for continued  mild chest discomfort and palpitations.  She underwent an outpatient monitor which showed frequent short runs of SVT.  Thus, metoprolol was increased to 50 mg twice daily with subsequent improvement in symptoms.  She is doing better now. She tested positive for Covid in November but had no symptoms.  She was supposed to get right hip replacement but the surgery was canceled due to cancellation of elective surgeries. She had bleeding from the left foot small varicose vein.    Past Medical History:  Diagnosis Date  . Allergy 1980  . Anemia 09-2017  . Arthritis   . Blood dyscrasia    BLEEDS EASILY   . Blood transfusion without reported diagnosis 2011 or 12  . Cataract ?  . CHF (congestive heart failure) ([place]) ?  Marland Kitchen Clotting disorder ([place]) ?  Marland Kitchen COPD (chronic obstructive pulmonary disease) ([place])   . Diabetes mellitus without complication ([place])   . Diffuse cystic mastopathy   . Emphysema of lung ([place]) ?  Marland Kitchen Hyperlipidemia   . Hypertension   . Hypothyroidism   . L4-L5 disc bulge   . Multiple myeloma ([place])   . Myocardial infarction ([place]) 2019  . Osteoporosis, post-menopausal   . PAD (peripheral artery disease) ([place])   . Renal insufficiency   . Shortness of breath dyspnea    WITH EXERTION   . Stroke ([place])    TIA     2016     WAS FOUND ON SCAN ORDERED FROM NEUROL  . Thyroid disease   . TIA (transient ischemic attack) 2016   left hand weakness    Past Surgical History:  Procedure Laterality Date  .  ABDOMINAL HYSTERECTOMY  1990  . BACK SURGERY  2011  . BREAST EXCISIONAL BIOPSY Left    2010?   Marland Kitchen BREAST SURGERY  1975   A lump removed left breast  . CATARACT EXTRACTION, BILATERAL    . COLONOSCOPY  1998  . EYE SURGERY Bilateral 05/06/14  . JOINT REPLACEMENT     LT KNEE  . KNEE SURGERY Left 2012  . LEFT HEART CATH AND CORONARY ANGIOGRAPHY Left 11/19/2018   Procedure: LEFT HEART CATH AND CORONARY ANGIOGRAPHY;  Surgeon: Wellington Hampshire, MD;  Location: [facility];  Service:  Cardiovascular;  Laterality: Left;  . OOPHORECTOMY  1990  . parathyroid transplant     2/3 THYROID REMOVED   ([place])  . SPINE SURGERY    . THYROID SURGERY    . TONSILLECTOMY    . TOTAL SHOULDER ARTHROPLASTY Left 01/14/2016   Procedure: LEFT TOTAL SHOULDER ARTHROPLASTY;  Surgeon: Justice Britain, MD;  Location: [facility];  Service: Orthopedics;  Laterality: Left;  . TOTAL SHOULDER ARTHROPLASTY Right 11/03/2016   Procedure: RIGHT TOTAL SHOULDER ARTHROPLASTY;  Surgeon: Justice Britain, MD;  Location: [facility];  Service: Orthopedics;  Laterality: Right;  . ULNAR NERVE REPAIR     LEFT  . vein closure procedure Left 2010     Current Outpatient Medications  Medication Sig Dispense Refill  . acetaminophen (TYLENOL) 500 MG tablet Take 500 mg by mouth every 6 (six) hours as needed.    Marland Kitchen aspirin EC 81 MG tablet Take 1 tablet (81 mg total) by mouth daily. 90 tablet 3  . atorvastatin (LIPITOR) 40 MG tablet Take 1 tablet (40 mg total) by mouth daily. 90 tablet 3  . benazepril (LOTENSIN) 20 MG tablet Take 1 tablet (20 mg total) by mouth daily. 90 tablet 4  . ferrous sulfate 325 (65 FE) MG tablet Take 325 mg by mouth daily with breakfast.    . levothyroxine (SYNTHROID, LEVOTHROID) 100 MCG tablet Take 1 tablet (100 mcg total) by mouth daily. 90 tablet 4  . metoprolol tartrate (LOPRESSOR) 50 MG tablet Take 1 tablet (50 mg total) by mouth 2 (two) times daily. 60 tablet 11  . Multiple Vitamin (MULTIVITAMIN WITH MINERALS) TABS tablet Take 1 tablet by mouth daily.    Marland Kitchen triamcinolone cream (KENALOG) 0.1 % Apply 1 application topically 2 (two) times daily. 30 g 0   No current facility-administered medications for this visit.    Allergies:   Codeine and Simvastatin    Social History:  The patient  reports that she quit smoking about 29 years ago. Her smoking use included cigarettes. She has a 75.00 pack-year smoking history. She has never used smokeless tobacco. She reports previous alcohol use. She reports that she  does not use drugs.   Family History:  The patient's family history includes Arthritis in her brother, daughter, daughter, father, mother, and sister; COPD in her brother, father, sister, and sister; Cancer in her father and paternal aunt; Diabetes in her brother, daughter, and sister; Hearing loss in her father; Heart disease in her brother, father, maternal uncle, mother, paternal uncle, sister, and sister; Hyperlipidemia in her brother, father, mother, sister, sister, and sister; Hypertension in her brother, father, mother, sister, sister, and sister; Stroke in her mother; Varicose Veins in her brother, mother, sister, and sister; Vision loss in her father and sister.    ROS:  Please see the history of present illness.   Otherwise, review of systems are positive for none.   All other systems are  reviewed and negative.    PHYSICAL EXAM: VS:  BP (!) 146/64 (BP Location: Right Arm, Patient Position: Sitting, Cuff Size: Normal)   Pulse 60   Ht '5\' 5"'  (1.651 m)   Wt 176 lb 8 oz (80.1 kg)   SpO2 97%   BMI 29.37 kg/m  , BMI Body mass index is 29.37 kg/m. GEN: Well nourished, well developed, in no acute distress  HEENT: normal  Neck: no JVD, carotid bruits, or masses Cardiac: RRR; no murmurs, rubs, or gallops,no edema  Respiratory:  clear to auscultation bilaterally, normal work of breathing GI: soft, nontender, nondistended, + BS MS: no deformity or atrophy  Skin: warm and dry, no rash Neuro:  Strength and sensation are intact Psych: euthymic mood, full affect   EKG:  EKG is ordered today. The ekg ordered today demonstrates normal sinus rhythm with LVH and old septal infarct.  Recent Labs: 05/10/2018: TSH 0.514 02/11/2019: ALT 14; BUN 31; Creatinine, Ser 1.52; Hemoglobin 10.0; Platelets 213; Potassium 4.1; Sodium 134    Lipid Panel    Component Value Date/Time   CHOL 144 01/29/2019 1116   CHOL 152 11/30/2018 0946   CHOL 157 12/28/2017 1451   TRIG 217 (H) 01/29/2019 1116   TRIG  230 (H) 12/28/2017 1451   HDL 40 (L) 01/29/2019 1116   HDL 40 11/30/2018 0946   CHOLHDL 3.6 01/29/2019 1116   VLDL 43 (H) 01/29/2019 1116   VLDL 46 (H) 12/28/2017 1451   LDLCALC 61 01/29/2019 1116   LDLCALC 79 11/30/2018 0946      Wt Readings from Last 3 Encounters:  05/07/19 176 lb 8 oz (80.1 kg)  04/23/19 175 lb (79.4 kg)  02/11/19 175 lb 7.8 oz (79.6 kg)       No flowsheet data found.    ASSESSMENT AND PLAN:  1.  Coronary artery disease involving native coronary arteries with stable angina: Known occluded LAD with collaterals.  Continue medical therapy.  Symptoms improved significantly with increasing metoprolol.  We could consider adding amlodipine down the road.  2. Peripheral arterial disease with moderate bilateral calf claudication worse on the left side:     Stable symptoms overall.  Continue medical therapy.  3.  Hyperlipidemia: Continue treatment with atorvastatin. Most recent lipid profile in  July showed an LDL of 64. Triglyceride was mildly elevated at 262.  4. Essential hypertension: Blood pressure is mildly elevated.  5.  Small abdominal aortic aneurysm: I requested a follow-up abdominal aortic ultrasound.      Disposition:   FU with me in 6 months  Signed,  Kathlyn Sacramento, MD  05/07/2019 1:48 PM    [place]

## 2019-05-07 NOTE — Patient Instructions (Signed)
Medication Instructions:  No medication changes. *If you need a refill on your cardiac medications before your next appointment, please call your pharmacy*  Lab Work: None ordered. If you have labs (blood work) drawn today and your tests are completely normal, you will receive your results only by: Marland Kitchen MyChart Message (if you have MyChart) OR . A paper copy in the mail If you have any lab test that is abnormal or we need to change your treatment, we will call you to review the results.  Testing/Procedures: Your physician has requested that you have an abdominal aorta duplex. During this test, an ultrasound is used to evaluate the aorta. Allow 30 minutes for this exam. Do not eat after midnight the day before and avoid carbonated beverages  Follow-Up: At [facility], you and your health needs are our priority.  As part of our continuing mission to provide you with exceptional heart care, we have created designated Provider Care Teams.  These Care Teams include your primary Cardiologist (physician) and Advanced Practice Providers (APPs -  Physician Assistants and Nurse Practitioners) who all work together to provide you with the care you need, when you need it.  Your next appointment:   6 month(s)  The format for your next appointment:   In Person  Provider:    You may see Kathlyn Sacramento, MD or one of the following Advanced Practice Providers on your designated Care Team:    Murray Hodgkins, NP  Christell Faith, PA-C  Marrianne Mood, PA-C   Other Instructions NA

## 2019-05-08 ENCOUNTER — Other Ambulatory Visit: Payer: Self-pay

## 2019-05-09 ENCOUNTER — Inpatient Hospital Stay: Payer: Medicare Other | Attending: Hematology and Oncology

## 2019-05-09 DIAGNOSIS — I714 Abdominal aortic aneurysm, without rupture: Secondary | ICD-10-CM | POA: Diagnosis not present

## 2019-05-09 DIAGNOSIS — Z87891 Personal history of nicotine dependence: Secondary | ICD-10-CM | POA: Insufficient documentation

## 2019-05-09 DIAGNOSIS — J449 Chronic obstructive pulmonary disease, unspecified: Secondary | ICD-10-CM | POA: Diagnosis not present

## 2019-05-09 DIAGNOSIS — I252 Old myocardial infarction: Secondary | ICD-10-CM | POA: Insufficient documentation

## 2019-05-09 DIAGNOSIS — C9000 Multiple myeloma not having achieved remission: Secondary | ICD-10-CM | POA: Diagnosis not present

## 2019-05-09 DIAGNOSIS — M25551 Pain in right hip: Secondary | ICD-10-CM | POA: Diagnosis not present

## 2019-05-09 DIAGNOSIS — Z90710 Acquired absence of both cervix and uterus: Secondary | ICD-10-CM | POA: Insufficient documentation

## 2019-05-09 DIAGNOSIS — N183 Chronic kidney disease, stage 3 unspecified: Secondary | ICD-10-CM | POA: Insufficient documentation

## 2019-05-09 DIAGNOSIS — Z8673 Personal history of transient ischemic attack (TIA), and cerebral infarction without residual deficits: Secondary | ICD-10-CM | POA: Diagnosis not present

## 2019-05-09 DIAGNOSIS — D539 Nutritional anemia, unspecified: Secondary | ICD-10-CM | POA: Diagnosis not present

## 2019-05-09 DIAGNOSIS — D472 Monoclonal gammopathy: Secondary | ICD-10-CM

## 2019-05-09 DIAGNOSIS — E119 Type 2 diabetes mellitus without complications: Secondary | ICD-10-CM | POA: Insufficient documentation

## 2019-05-09 DIAGNOSIS — Z7982 Long term (current) use of aspirin: Secondary | ICD-10-CM | POA: Insufficient documentation

## 2019-05-09 DIAGNOSIS — Z8616 Personal history of COVID-19: Secondary | ICD-10-CM | POA: Diagnosis not present

## 2019-05-09 DIAGNOSIS — Z79899 Other long term (current) drug therapy: Secondary | ICD-10-CM | POA: Diagnosis not present

## 2019-05-09 DIAGNOSIS — E039 Hypothyroidism, unspecified: Secondary | ICD-10-CM | POA: Diagnosis not present

## 2019-05-09 LAB — COMPREHENSIVE METABOLIC PANEL
ALT: 13 U/L (ref 0–44)
AST: 20 U/L (ref 15–41)
Albumin: 4 g/dL (ref 3.5–5.0)
Alkaline Phosphatase: 72 U/L (ref 38–126)
Anion gap: 6 (ref 5–15)
BUN: 20 mg/dL (ref 8–23)
CO2: 25 mmol/L (ref 22–32)
Calcium: 9.1 mg/dL (ref 8.9–10.3)
Chloride: 103 mmol/L (ref 98–111)
Creatinine, Ser: 1.39 mg/dL — ABNORMAL HIGH (ref 0.44–1.00)
GFR calc Af Amer: 42 mL/min — ABNORMAL LOW (ref >60)
GFR calc non Af Amer: 36 mL/min — ABNORMAL LOW (ref >60)
Glucose, Bld: 106 mg/dL — ABNORMAL HIGH (ref 70–99)
Potassium: 3.7 mmol/L (ref 3.5–5.1)
Sodium: 134 mmol/L — ABNORMAL LOW (ref 135–145)
Total Bilirubin: 0.3 mg/dL (ref 0.3–1.2)
Total Protein: 8.4 g/dL — ABNORMAL HIGH (ref 6.5–8.1)

## 2019-05-09 LAB — CBC WITH DIFFERENTIAL/PLATELET
Abs Immature Granulocytes: 0.01 10*3/uL (ref 0.00–0.07)
Basophils Absolute: 0 10*3/uL (ref 0.0–0.1)
Basophils Relative: 1 %
Eosinophils Absolute: 0.3 10*3/uL (ref 0.0–0.5)
Eosinophils Relative: 6 %
HCT: 29.9 % — ABNORMAL LOW (ref 36.0–46.0)
Hemoglobin: 9.4 g/dL — ABNORMAL LOW (ref 12.0–15.0)
Immature Granulocytes: 0 %
Lymphocytes Relative: 29 %
Lymphs Abs: 1.4 10*3/uL (ref 0.7–4.0)
MCH: 34.1 pg — ABNORMAL HIGH (ref 26.0–34.0)
MCHC: 31.4 g/dL (ref 30.0–36.0)
MCV: 108.3 fL — ABNORMAL HIGH (ref 80.0–100.0)
Monocytes Absolute: 0.5 10*3/uL (ref 0.1–1.0)
Monocytes Relative: 10 %
Neutro Abs: 2.5 10*3/uL (ref 1.7–7.7)
Neutrophils Relative %: 54 %
Platelets: 270 10*3/uL (ref 150–400)
RBC: 2.76 MIL/uL — ABNORMAL LOW (ref 3.87–5.11)
RDW: 14.6 % (ref 11.5–15.5)
WBC: 4.7 10*3/uL (ref 4.0–10.5)
nRBC: 0 % (ref 0.0–0.2)

## 2019-05-10 LAB — PROTEIN ELECTROPHORESIS, SERUM
A/G Ratio: 1 (ref 0.7–1.7)
Albumin ELP: 3.9 g/dL (ref 2.9–4.4)
Alpha-1-Globulin: 0.2 g/dL (ref 0.0–0.4)
Alpha-2-Globulin: 1 g/dL (ref 0.4–1.0)
Beta Globulin: 0.9 g/dL (ref 0.7–1.3)
Gamma Globulin: 1.8 g/dL (ref 0.4–1.8)
Globulin, Total: 4 g/dL — ABNORMAL HIGH (ref 2.2–3.9)
M-Spike, %: 1.6 g/dL — ABNORMAL HIGH
Total Protein ELP: 7.9 g/dL (ref 6.0–8.5)

## 2019-05-10 LAB — KAPPA/LAMBDA LIGHT CHAINS
Kappa free light chain: 17.8 mg/L (ref 3.3–19.4)
Kappa, lambda light chain ratio: 0.07 — ABNORMAL LOW (ref 0.26–1.65)
Lambda free light chains: 258.4 mg/L — ABNORMAL HIGH (ref 5.7–26.3)

## 2019-05-11 NOTE — Progress Notes (Addendum)
[facility]  [address], [address] [place], [place] [address] Phone: [phone]  Fax: [phone]   Telephone Visit:  05/13/2019  Referring physician: Guadalupe Maple, MD  I connected with [name] on 05/13/19 at 11:10 AM by telephone and verified that I was speaking with the correct person using 2 identifiers. The patient was at home.  I discussed the limitations, risk, security and privacy concerns of performing an evaluation and management service by telephone and the availability of in person appointments.  I also discussed with the patient that there may be a patient responsible charge related to this service.  The patient expressed understanding and agreed to proceed.   Chief Complaint: Bailey Ayala is a 79 y.o. female with smoldering myeloma who is seen for 3 month assessment   HPI: The patient was last seen in the medical oncology clinic on 02/11/2019. At that time, she denied any interval infections, bone pain, fevers, sweats or weight loss.  Exam was stable. Kappa free light chains were 18.1, labmda free light chains were 228.9, and ratio 0.08 (0.26 - 1.65).  M-spike was 1.4 g/dL  She tested + for COVID on 03/27/2019.  Her grandson visited her for Thanksgiving; he tested positive for COVID and so she got tested. She received first COVID vaccine 05/01/2019 and receives second dose on 05/23/2019.  She was seen for wound check of varicose vein bleed at [facility] ED on 04/23/2019. Varicose vein bleed heavily on Christmas. She stopped bleeding before EMS could arrive. There was no evidence of infection.  The area was healing on 05/07/2019.   She was seen for follow up of CAD by Dr. Fletcher Anon on 05/07/2019. She was doing well on her metoprolol. She had a small abdominal aortic aneurysm. She has follow up in 6 months.   Labs on 05/09/2019 showed a hematocrit 29.9, hemoglobin 9.4, MCV 108.3, platelets 270,000, WBC 4700, ANC 2500. Creatinine was 1.39. Kappa  free light chains were 17.8, lambda free light chains 258.4, and ratio 0.07.  M-spike was 1.6 gm/dL.  She has a doppler of her aortic aneurysm scheduled for 05/31/2019.   Symptomatically, she is doing fine. She hasn't had any new changes. She states that she needs a hip replacement. She has had another vein burst since Christmas. Bleeding wasn't as bad as the incident on Christmas.  She has chronic back pain, just above the waist.   She has 2 new "bumps" on her thumb and her shoulder. The one in her thumb is no larger than a pea (rheumatoid nodule) and the one on her shoulder in no bigger than an acorn.    Past Medical History:  Diagnosis Date  . Allergy 1980  . Anemia 09-2017  . Arthritis   . Blood dyscrasia    BLEEDS EASILY   . Blood transfusion without reported diagnosis 2011 or 12  . Cataract ?  . CHF (congestive heart failure) ([place]) ?  Marland Kitchen Clotting disorder ([place]) ?  Marland Kitchen COPD (chronic obstructive pulmonary disease) ([place])   . Diabetes mellitus without complication ([place])   . Diffuse cystic mastopathy   . Emphysema of lung ([place]) ?  Marland Kitchen Hyperlipidemia   . Hypertension   . Hypothyroidism   . L4-L5 disc bulge   . Multiple myeloma ([place])   . Myocardial infarction ([place]) 2019  . Osteoporosis, post-menopausal   . PAD (peripheral artery disease) ([place])   . Renal insufficiency   . Shortness of breath dyspnea    WITH EXERTION   .  Stroke ([place])    TIA     2016     WAS FOUND ON SCAN ORDERED FROM NEUROL  . Thyroid disease   . TIA (transient ischemic attack) 2016   left hand weakness    Past Surgical History:  Procedure Laterality Date  . ABDOMINAL HYSTERECTOMY  1990  . BACK SURGERY  2011  . BREAST EXCISIONAL BIOPSY Left    2010?   Marland Kitchen BREAST SURGERY  1975   A lump removed left breast  . CATARACT EXTRACTION, BILATERAL    . COLONOSCOPY  1998  . EYE SURGERY Bilateral 05/06/14  . JOINT REPLACEMENT     LT KNEE  . KNEE SURGERY Left 2012  . LEFT HEART CATH AND CORONARY ANGIOGRAPHY Left  11/19/2018   Procedure: LEFT HEART CATH AND CORONARY ANGIOGRAPHY;  Surgeon: [place] Hampshire, MD;  Location: [facility];  Service: Cardiovascular;  Laterality: Left;  . OOPHORECTOMY  1990  . parathyroid transplant     2/3 THYROID REMOVED   ([place])  . SPINE SURGERY    . THYROID SURGERY    . TONSILLECTOMY    . TOTAL SHOULDER ARTHROPLASTY Left 01/14/2016   Procedure: LEFT TOTAL SHOULDER ARTHROPLASTY;  Surgeon: Justice Britain, MD;  Location: [facility];  Service: Orthopedics;  Laterality: Left;  . TOTAL SHOULDER ARTHROPLASTY Right 11/03/2016   Procedure: RIGHT TOTAL SHOULDER ARTHROPLASTY;  Surgeon: Justice Britain, MD;  Location: [facility];  Service: Orthopedics;  Laterality: Right;  . ULNAR NERVE REPAIR     LEFT  . vein closure procedure Left 2010    Family History  Problem Relation Age of Onset  . Heart disease Mother        MI  . Arthritis Mother   . Stroke Mother   . Hyperlipidemia Mother   . Hypertension Mother   . Varicose Veins Mother   . Heart disease Father        MI  . Hyperlipidemia Father   . Arthritis Father   . Cancer Father   . COPD Father   . Hearing loss Father   . Hypertension Father   . Vision loss Father   . Diabetes Sister   . COPD Sister   . Heart disease Sister   . Hyperlipidemia Sister   . Hypertension Sister   . Hyperlipidemia Brother   . COPD Brother   . Diabetes Brother   . Heart disease Brother   . Hypertension Brother   . Arthritis Sister   . Hyperlipidemia Sister   . Hypertension Sister   . Vision loss Sister   . Varicose Veins Sister   . Arthritis Brother   . Varicose Veins Brother   . Arthritis Daughter   . Arthritis Daughter   . Diabetes Daughter   . Cancer Paternal Aunt   . COPD Sister   . Heart disease Sister   . Hyperlipidemia Sister   . Hypertension Sister   . Varicose Veins Sister   . Heart disease Maternal Uncle   . Heart disease Paternal Uncle     Social History:  reports that she quit smoking about 29 years ago. Her  smoking use included cigarettes. She has a 75.00 pack-year smoking history. She has never used smokeless tobacco. She reports previous alcohol use. She reports that she does not use drugs. Patient is a former 3 pack per day smoker for 25 years (75 pack year). Patient lives in [place]. She is a former Electrical engineer. Patient denies known exposures to radiation on toxins.  The patient is alone today.  Participants in the patient's visit and their role in the encounter included the patient, Samul Dada, scribe, and [employer], CMA, today.  The intake visit was provided by Samul Dada, scribe, and Vito Berger, CMA.    Allergies:  Allergies  Allergen Reactions  . Codeine Other (See Comments)    hallucinations    . Simvastatin Diarrhea and Nausea Only    Current Medications: Current Outpatient Medications  Medication Sig Dispense Refill  . acetaminophen (TYLENOL) 500 MG tablet Take 500 mg by mouth every 6 (six) hours as needed.    Marland Kitchen aspirin EC 81 MG tablet Take 1 tablet (81 mg total) by mouth daily. 90 tablet 3  . benazepril (LOTENSIN) 20 MG tablet Take 1 tablet (20 mg total) by mouth daily. 90 tablet 4  . ferrous sulfate 325 (65 FE) MG tablet Take 325 mg by mouth daily with breakfast.    . levothyroxine (SYNTHROID, LEVOTHROID) 100 MCG tablet Take 1 tablet (100 mcg total) by mouth daily. 90 tablet 4  . metoprolol tartrate (LOPRESSOR) 50 MG tablet Take 1 tablet (50 mg total) by mouth 2 (two) times daily. 60 tablet 11  . Multiple Vitamin (MULTIVITAMIN WITH MINERALS) TABS tablet Take 1 tablet by mouth daily.    Marland Kitchen triamcinolone cream (KENALOG) 0.1 % Apply 1 application topically 2 (two) times daily. 30 g 0  . atorvastatin (LIPITOR) 40 MG tablet Take 1 tablet (40 mg total) by mouth daily. 90 tablet 3   No current facility-administered medications for this visit.    Review of Systems  Constitutional: Negative.  Negative for chills, diaphoresis, fever, malaise/fatigue and weight loss.         Doing "fine".  HENT: Negative.  Negative for congestion, ear pain, nosebleeds, sinus pain and sore throat.   Eyes: Negative.  Negative for blurred vision, double vision and photophobia.  Respiratory: Positive for cough (chronic) and shortness of breath (exertional). Negative for hemoptysis and sputum production.        COPD.  Cardiovascular: Positive for chest pain (interval abnormal stress test). Negative for palpitations, orthopnea, claudication, leg swelling and PND.  Gastrointestinal: Positive for diarrhea (chronic loose stools). Negative for abdominal pain, blood in stool, constipation, melena, nausea and vomiting.  Genitourinary: Negative.  Negative for dysuria, frequency, hematuria and urgency.  Musculoskeletal: Positive for back pain (chronic) and joint pain (right hip). Negative for falls, myalgias and neck pain.       Bumps on shoulder and thumb.  Skin: Negative.  Negative for itching and rash.  Neurological: Negative.  Negative for dizziness, tremors, sensory change, speech change, focal weakness, weakness and headaches.  Endo/Heme/Allergies: Bruises/bleeds easily.       HYPOthyroidism - on levothyroxine.  Varicose vein bleeding on Christmas.  Psychiatric/Behavioral: Negative.  Negative for depression and memory loss. The patient is not nervous/anxious and does not have insomnia.   All other systems reviewed and are negative.  Performance status (ECOG):  1  Vitals There were no vitals taken for this visit.   Physical Exam  Constitutional: She is oriented to person, place, and time.  Neurological: She is alert and oriented to person, place, and time.  Psychiatric: She has a normal mood and affect. Her behavior is normal. Judgment and thought content normal.  Nursing note reviewed.    No visits with results within 3 Day(s) from this visit.  Latest known visit with results is:  Appointment on 05/09/2019  Component Date Value Ref Range Status  .  Kappa free light chain  05/09/2019 17.8  3.3 - 19.4 mg/L Final  . Lamda free light chains 05/09/2019 258.4* 5.7 - 26.3 mg/L Final  . Kappa, lamda light chain ratio 05/09/2019 0.07* 0.26 - 1.65 Final   Comment: (NOTE) Performed At: [facility] [address] [place], [place] [id number] Rush Farmer MD ON:[id number]   . Total Protein ELP 05/09/2019 7.9  6.0 - 8.5 g/dL Final  . Albumin ELP 05/09/2019 3.9  2.9 - 4.4 g/dL Final  . Alpha-1-Globulin 05/09/2019 0.2  0.0 - 0.4 g/dL Final  . Alpha-2-Globulin 05/09/2019 1.0  0.4 - 1.0 g/dL Final  . Beta Globulin 05/09/2019 0.9  0.7 - 1.3 g/dL Final  . Gamma Globulin 05/09/2019 1.8  0.4 - 1.8 g/dL Final  . M-Spike, % 05/09/2019 1.6* Not Observed g/dL Final  . SPE Interp. 05/09/2019 Comment   Final   Comment: (NOTE) The SPE pattern demonstrates a single peak (M-spike) in the gamma region which may represent monoclonal protein. This peak may also be caused by circulating immune complexes, cryoglobulins, C-reactive protein, fibrinogen or hemolysis.  If clinically indicated, the presence of a monoclonal gammopathy may be confirmed by immuno- fixation, as well as an evaluation of the urine for the presence of Bence-Jones protein. Performed At: [facility] [place], [place] [id number] Rush Farmer MD DG:[id number]   . Comment 05/09/2019 Comment   Final   Comment: (NOTE) Protein electrophoresis scan will follow via computer, mail, or courier delivery.   . Globulin, Total 05/09/2019 4.0* 2.2 - 3.9 g/dL Corrected  . A/G Ratio 05/09/2019 1.0  0.7 - 1.7 Corrected  . Sodium 05/09/2019 134* 135 - 145 mmol/L Final  . Potassium 05/09/2019 3.7  3.5 - 5.1 mmol/L Final  . Chloride 05/09/2019 103  98 - 111 mmol/L Final  . CO2 05/09/2019 25  22 - 32 mmol/L Final  . Glucose, Bld 05/09/2019 106* 70 - 99 mg/dL Final  . BUN 05/09/2019 20  8 - 23 mg/dL Final  . Creatinine, Ser 05/09/2019 1.39* 0.44 - 1.00 mg/dL Final  . Calcium 05/09/2019 9.1  8.9 -  10.3 mg/dL Final  . Total Protein 05/09/2019 8.4* 6.5 - 8.1 g/dL Final  . Albumin 05/09/2019 4.0  3.5 - 5.0 g/dL Final  . AST 05/09/2019 20  15 - 41 U/L Final  . ALT 05/09/2019 13  0 - 44 U/L Final  . Alkaline Phosphatase 05/09/2019 72  38 - 126 U/L Final  . Total Bilirubin 05/09/2019 0.3  0.3 - 1.2 mg/dL Final  . GFR calc non Af Amer 05/09/2019 36* >60 mL/min Final  . GFR calc Af Amer 05/09/2019 42* >60 mL/min Final  . Anion gap 05/09/2019 6  5 - 15 Final   Performed at [facility], [address] [place], [place] [address]  . WBC 05/09/2019 4.7  4.0 - 10.5 K/uL Final  . RBC 05/09/2019 2.76* 3.87 - 5.11 MIL/uL Final  . Hemoglobin 05/09/2019 9.4* 12.0 - 15.0 g/dL Final  . HCT 05/09/2019 29.9* 36.0 - 46.0 % Final  . MCV 05/09/2019 108.3* 80.0 - 100.0 fL Final  . MCH 05/09/2019 34.1* 26.0 - 34.0 pg Final  . MCHC 05/09/2019 31.4  30.0 - 36.0 g/dL Final  . RDW 05/09/2019 14.6  11.5 - 15.5 % Final  . Platelets 05/09/2019 270  150 - 400 K/uL Final  . nRBC 05/09/2019 0.0  0.0 - 0.2 % Final  . Neutrophils Relative % 05/09/2019 54  % Final  .  Neutro Abs 05/09/2019 2.5  1.7 - 7.7 K/uL Final  . Lymphocytes Relative 05/09/2019 29  % Final  . Lymphs Abs 05/09/2019 1.4  0.7 - 4.0 K/uL Final  . Monocytes Relative 05/09/2019 10  % Final  . Monocytes Absolute 05/09/2019 0.5  0.1 - 1.0 K/uL Final  . Eosinophils Relative 05/09/2019 6  % Final  . Eosinophils Absolute 05/09/2019 0.3  0.0 - 0.5 K/uL Final  . Basophils Relative 05/09/2019 1  % Final  . Basophils Absolute 05/09/2019 0.0  0.0 - 0.1 K/uL Final  . Immature Granulocytes 05/09/2019 0  % Final  . Abs Immature Granulocytes 05/09/2019 0.01  0.00 - 0.07 K/uL Final   Performed at [facility], [address] [place], [place] [address]    Assessment:  Bailey Ayala is a 79 y.o. female with smoldering multiple myeloma. SPEPon 06/21/2017 revealed a 1.2 gm/dL monoclonal spike. Random urine revealed 4.6 mg/dL protein  and a 40.6% M-spike. Calcium, albumin, and serum protein were normal.  Work-up on 03/08/2019revealed a hematocrit of 31.1, hemoglobin 10.4, MCV 97.4, platelets 227,000, WBC 5700 with an ANC of 3300. SPEPrevealed a 1.1 gm/dL IgG monoclonal protein with lambda light chain specificity and monoclonal free lambda light chains(Bence-Jones protein). IgG was 1660 ([phone]) and IgA was 48 (64-422). Kappa free light chains were 23.7, lambda free light chains were 142.2 and ratio 0.17 (0.26-1.65). Beta2-microglobulin was 4.7 (0.6-2.4). Normal studies included: B12, folate, and ferritin (75). TSHwas 0.204 (low). 24 hour UPEPrevealed kappa free light chains 28.80 mg/L, lambda free light chains 63.80 mg/L and ration 0.45 (2.04 - 10.37). M spike was 27.4% (16 mg/24 hours).  Bone surveyon 06/30/2017 revealed no suspicious focal bony lesions.  Bone marrow aspirate and biopsyon 07/26/2017 revealed a slightly hypercellular marrow for age with trilineage hematopoiesis. There was 13% plasmacytosis. IHC stains for CD138 and in situ hybridization for kappa and lambda revealed interstitial cells and small clusters. Lambda light chains appeared restricted. Features were felt compatible with plasma cell neoplasm.  PET scanon 08/14/2017 revealed no hypermetabolic lesions of myeloma. There was a 3.6 cm infrarenal aortic aneurysm. Ultrasound in 2 years was recommended. She had a 4 mm LLL noduletoo small to characterize.  SPEPhas been followed: 1.1 on 06/30/2017, 1.2 on 11/14/2017, 1.3 on 01/26/2018, 1.2 on 05/10/2018, 1.4 on 08/08/2018, 1.2 on 11/09/2018, 1.4 on 02/11/2019, and 1.6 on 05/09/2019. IgGhas been followed: 1660 on 06/30/2017, 1684 on 11/14/2017, 1795 on 01/26/2018, and 2054 on 08/08/2018.  Lambda free light chains have been followed: 142.2 (ratio 0.17) on 06/30/2017, 214.4 (ratio 0.09) on 05/10/2018, 209.3 (ratio 0.08) on 08/07/2018, 220.2 (ratio 0.07) on 11/09/2018, 228.9 (ratio  0.08) on 02/11/2019, and 258.4 (ratio 0.07) on 05/09/2019.  Chest CTon 01/24/2018 revealed persistent LLL nodule. Area calcified and felt to be consistent with a granuloma. There were no other suspicious pulmonary nodules. There was new Schmorl's node formation involving the inferior endplate of D92. There were stable small scattered osseous lesions related to underlying multiple myeloma.  PET scanon 05/15/2018 revealed nohypermetabolic lesions characteristic of active myeloma. There was stable hypermetabolic apical wall of the left ventricle, there waspotentially a small pseudoaneurysmin this vicinity, but no appreciable mass was noted on 01/24/2018. There was aninfrarenal abdominal aortic aneurysm, 3.6 cm in diameter. Follow-up ultrasound in 2 years was recommended. Other imaging findings of potential clinical significance: aortic atherosclerosis and coronary atherosclerosis.  She has stage III chronic kidney disease(Cr 1.55; CrCl 32.3 ml/min).   She has a macrocytic anemia. B12, folate, and TSH  were normal on 05/10/2018. Retic was 1.6%. Ferritin and iron studies were normal on 05/10/2018. Appetiteremains poor. Last colonoscopywas >10 years ago (she declines repeat).  She has a 75 pack year smoking history. She has COPD. Chest CT without contraston 07/25/2017 revealed no suspicious pulmonary nodules.  Symptomatically, she is doing "fine".  She denies any bone pain or interval infections.  Plan: 1.   Review labs from 05/09/2019.  2.   Smoldering multiple myeloma Clinically, she appears to be doing well. M spike is 1.6 g/dL. Lambda free light chains 258.4 (ratio 0.07). Bone survey this week. Discuss continued surveillance every 3 months. 3.   Back and right hip pain PET scan on 05/15/2018 revealed no active myeloma. Patient's hip surgery postponed s/p cardiac evaluation. 4.   Renal insufficieny Creatinine 1.39 (improved from 1.52 on  02/11/2019). Patient is followed by Dr Holley Raring. 5. Macrocytic anemia, progressive Hematocrit 28.8.  Hemoglobin 9.2.  MCV 109.9 on 12/21/2018.               Hematocrit 31.3.  Hemoglobin 10.0.  MCV 107.2 on 02/11/2019.  Hematocrit 29.9.  Hemoglobin 9.4.  MCV 108.3 on 05/09/2019.             B12, folate and TSH were normal on 05/10/2018.               Discuss plans to repeat studies this week:  B12, folate, ferritin, iron studies.  She has no liver disease.               Etiology may be secondary to a myelodysplastic syndrome (MDS).               Bone marrow on 07/26/2017 revealed no evidence of MDS (MCV was normal).              Discuss consideration of follow-up bone marrow.  Continue surveillance. 6.   RTC this week for labs (ferritin, iron studies, B12, folate, retic). 7.   RTC in 3 months for MD assessment and labs (CBC with diff, CMP, SPEP, FLCA).  I discussed the assessment and treatment plan with the patient.  The patient was provided an opportunity to ask questions and all were answered.  The patient agreed with the plan and demonstrated an understanding of the instructions.  The patient was advised to call back if the symptoms worsen or if the condition fails to improve as anticipated.  I provided 22 minutes (11:10 AM - 11:31 AM) of face-to-face time during this this encounter and > 50% was spent counseling as documented under my assessment and plan.    Lequita Asal, MD, PhD    05/13/2019, 11:31 AM  I, Samul Dada, am acting as a scribe for Lequita Asal, MD.  I, [place] Mike Gip, MD, have reviewed the above documentation for accuracy and completeness, and I agree with the above.

## 2019-05-13 ENCOUNTER — Inpatient Hospital Stay (HOSPITAL_BASED_OUTPATIENT_CLINIC_OR_DEPARTMENT_OTHER): Payer: Medicare Other | Admitting: Hematology and Oncology

## 2019-05-13 ENCOUNTER — Encounter: Payer: Self-pay | Admitting: Hematology and Oncology

## 2019-05-13 ENCOUNTER — Other Ambulatory Visit: Payer: Medicare Other

## 2019-05-13 DIAGNOSIS — C9000 Multiple myeloma not having achieved remission: Secondary | ICD-10-CM | POA: Diagnosis not present

## 2019-05-13 DIAGNOSIS — Z7189 Other specified counseling: Secondary | ICD-10-CM

## 2019-05-13 DIAGNOSIS — D472 Monoclonal gammopathy: Secondary | ICD-10-CM

## 2019-05-13 DIAGNOSIS — D539 Nutritional anemia, unspecified: Secondary | ICD-10-CM | POA: Diagnosis not present

## 2019-05-13 NOTE — Progress Notes (Signed)
The patient c/o having lower back and right hip ( pain level today 5) but states she need a hip replacement.

## 2019-05-15 ENCOUNTER — Other Ambulatory Visit: Payer: Self-pay

## 2019-05-16 ENCOUNTER — Inpatient Hospital Stay: Payer: Medicare Other

## 2019-05-16 DIAGNOSIS — Z8616 Personal history of covid-19: Secondary | ICD-10-CM | POA: Diagnosis not present

## 2019-05-16 DIAGNOSIS — D472 Monoclonal gammopathy: Secondary | ICD-10-CM

## 2019-05-16 DIAGNOSIS — M25551 Pain in right hip: Secondary | ICD-10-CM | POA: Diagnosis not present

## 2019-05-16 DIAGNOSIS — C9000 Multiple myeloma not having achieved remission: Secondary | ICD-10-CM | POA: Diagnosis not present

## 2019-05-16 DIAGNOSIS — D539 Nutritional anemia, unspecified: Secondary | ICD-10-CM | POA: Diagnosis not present

## 2019-05-16 DIAGNOSIS — N183 Chronic kidney disease, stage 3 unspecified: Secondary | ICD-10-CM | POA: Diagnosis not present

## 2019-05-16 DIAGNOSIS — J449 Chronic obstructive pulmonary disease, unspecified: Secondary | ICD-10-CM | POA: Diagnosis not present

## 2019-05-16 LAB — RETICULOCYTES
Immature Retic Fract: 29.1 % — ABNORMAL HIGH (ref 2.3–15.9)
RBC.: 2.82 MIL/uL — ABNORMAL LOW (ref 3.87–5.11)
Retic Count, Absolute: 74.7 10*3/uL (ref 19.0–186.0)
Retic Ct Pct: 2.7 % (ref 0.4–3.1)

## 2019-05-16 LAB — IRON AND TIBC
Iron: 89 ug/dL (ref 28–170)
Saturation Ratios: 32 % — ABNORMAL HIGH (ref 10.4–31.8)
TIBC: 281 ug/dL (ref 250–450)
UIBC: 192 ug/dL

## 2019-05-16 LAB — FERRITIN: Ferritin: 128 ng/mL (ref 11–307)

## 2019-05-16 LAB — FOLATE: Folate: 60.9 ng/mL (ref >5.9)

## 2019-05-16 LAB — VITAMIN B12: Vitamin B-12: 911 pg/mL (ref 180–914)

## 2019-05-20 ENCOUNTER — Ambulatory Visit
Admission: RE | Admit: 2019-05-20 | Discharge: 2019-05-20 | Disposition: A | Discharge status: Home / Self Care | Payer: Medicare Other | Attending: Hematology and Oncology | Admitting: Hematology and Oncology

## 2019-05-20 ENCOUNTER — Ambulatory Visit
Admission: RE | Admit: 2019-05-20 | Discharge: 2019-05-20 | Disposition: A | Discharge status: Home / Self Care | Payer: Medicare Other | Source: Ambulatory Visit | Attending: Hematology and Oncology | Admitting: Hematology and Oncology

## 2019-05-20 DIAGNOSIS — C9000 Multiple myeloma not having achieved remission: Secondary | ICD-10-CM

## 2019-05-20 DIAGNOSIS — D472 Monoclonal gammopathy: Secondary | ICD-10-CM

## 2019-05-21 ENCOUNTER — Encounter: Payer: Self-pay | Admitting: Hematology and Oncology

## 2019-05-21 NOTE — Progress Notes (Signed)
[facility]  [address], [address] [place], [place] [address] Phone: [phone]  Fax: [phone]   Telephone Office Visit:  05/22/2019  Referring physician: Guadalupe Maple, MD  I connected with Bailey Ayala on 05/22/19 at 10:23 AM by telephone and verified that I was speaking with the correct person using 2 identifiers.  The patient was at home.  I discussed the limitations, risk, security and privacy concerns of performing an evaluation and management service by telephone and the availability of in person appointments.  I also discussed with the patient that there may be a patient responsible charge related to this service.  The patient expressed understanding and agreed to proceed.   Chief Complaint: Bailey Ayala is a 79 y.o. female with smoldering myeloma who is seen for review of interval bone survery.   HPI: The patient was last seen in the medical oncology clinic on 05/13/2019. At that time, she is doing "fine".  She denies any bone pain or interval infections.  Hematocrit was 29.9, hemoglobin 9.4, MCV 108.3, platelets 270,000, WBC 4700 with an ANC of 2500.  Creatinine was 1.39.  Calcium was 9.1.  Protein was 8.4.  M-spike was 1.6 gm/dL.  Lambda free light chains were 258.4 (ratio 0.07).  Additional labs on 05/16/2019 included a ferritin 128, iron saturation 32% with a TIBC 281.  Retic was 2.7%.  B12 was 911 and folate 60.9.  Bone survery on 05/20/2019 showed numerous new lytic lesions throughout the skeleton including the posterior calvarium, bilateral humeri, bilateral iliac bones, left inferior pubic ramus, bilateral femurs.  During the interim, she had a birthday last week. [name] bought her a bouquet of flowers. She has felt alright.  She states that she didn't do anything or go anywhere due to the pandemic  Symptomatically, she is still having hip pain.  She very rarely uses her Tramadol for pain for her right hip.  Her hip surgery has been  canceled 4 times.  She has a doppler test for her aortic aneurysm with Dr. Fletcher Anon on 05/28/2019.  Dr. Elliot Cousin is her general surgeon.    Past Medical History:  Diagnosis Date  . Allergy 1980  . Anemia 09-2017  . Arthritis   . Blood dyscrasia    BLEEDS EASILY   . Blood transfusion without reported diagnosis 2011 or 12  . Cataract ?  . CHF (congestive heart failure) ([place]) ?  Marland Kitchen Clotting disorder ([place]) ?  Marland Kitchen COPD (chronic obstructive pulmonary disease) ([place])   . Diabetes mellitus without complication ([place])   . Diffuse cystic mastopathy   . Emphysema of lung ([place]) ?  Marland Kitchen Hyperlipidemia   . Hypertension   . Hypothyroidism   . L4-L5 disc bulge   . Multiple myeloma ([place])   . Myocardial infarction ([place]) 2019  . Osteoporosis, post-menopausal   . PAD (peripheral artery disease) ([place])   . Renal insufficiency   . Shortness of breath dyspnea    WITH EXERTION   . Stroke ([place])    TIA     2016     WAS FOUND ON SCAN ORDERED FROM NEUROL  . Thyroid disease   . TIA (transient ischemic attack) 2016   left hand weakness    Past Surgical History:  Procedure Laterality Date  . ABDOMINAL HYSTERECTOMY  1990  . BACK SURGERY  2011  . BREAST EXCISIONAL BIOPSY Left    2010?   Marland Kitchen BREAST SURGERY  1975   A lump removed left breast  .  CATARACT EXTRACTION, BILATERAL    . COLONOSCOPY  1998  . EYE SURGERY Bilateral 05/06/14  . JOINT REPLACEMENT     LT KNEE  . KNEE SURGERY Left 2012  . LEFT HEART CATH AND CORONARY ANGIOGRAPHY Left 11/19/2018   Procedure: LEFT HEART CATH AND CORONARY ANGIOGRAPHY;  Surgeon: Wellington Hampshire, MD;  Location: [facility];  Service: Cardiovascular;  Laterality: Left;  . OOPHORECTOMY  1990  . parathyroid transplant     2/3 THYROID REMOVED   ([place])  . SPINE SURGERY    . THYROID SURGERY    . TONSILLECTOMY    . TOTAL SHOULDER ARTHROPLASTY Left 01/14/2016   Procedure: LEFT TOTAL SHOULDER ARTHROPLASTY;  Surgeon: Justice Britain, MD;  Location: [facility];  Service:  Orthopedics;  Laterality: Left;  . TOTAL SHOULDER ARTHROPLASTY Right 11/03/2016   Procedure: RIGHT TOTAL SHOULDER ARTHROPLASTY;  Surgeon: Justice Britain, MD;  Location: [facility];  Service: Orthopedics;  Laterality: Right;  . ULNAR NERVE REPAIR     LEFT  . vein closure procedure Left 2010    Family History  Problem Relation Age of Onset  . Heart disease Mother        MI  . Arthritis Mother   . Stroke Mother   . Hyperlipidemia Mother   . Hypertension Mother   . Varicose Veins Mother   . Heart disease Father        MI  . Hyperlipidemia Father   . Arthritis Father   . Cancer Father   . COPD Father   . Hearing loss Father   . Hypertension Father   . Vision loss Father   . Diabetes Sister   . COPD Sister   . Heart disease Sister   . Hyperlipidemia Sister   . Hypertension Sister   . Hyperlipidemia Brother   . COPD Brother   . Diabetes Brother   . Heart disease Brother   . Hypertension Brother   . Arthritis Sister   . Hyperlipidemia Sister   . Hypertension Sister   . Vision loss Sister   . Varicose Veins Sister   . Arthritis Brother   . Varicose Veins Brother   . Arthritis Daughter   . Arthritis Daughter   . Diabetes Daughter   . Cancer Paternal Aunt   . COPD Sister   . Heart disease Sister   . Hyperlipidemia Sister   . Hypertension Sister   . Varicose Veins Sister   . Heart disease Maternal Uncle   . Heart disease Paternal Uncle     Social History:  reports that she quit smoking about 29 years ago. Her smoking use included cigarettes. She has a 75.00 pack-year smoking history. She has never used smokeless tobacco. She reports previous alcohol use. She reports that she does not use drugs. Patient is a former 3 pack per day smoker for 25 years (75 pack year). Patient lives in [place]. She is a former Electrical engineer. Patient denies known exposures to radiation on toxins. The patient is alone today.  Participants in the patient's visit and their role in the encounter included  the patient, Samul Dada, scribe, and [employer], CMA, today.  The intake visit was provided by Samul Dada, scribe and Vito Berger, [place].   Allergies:  Allergies  Allergen Reactions  . Codeine Other (See Comments)    hallucinations    . Simvastatin Diarrhea and Nausea Only    Current Medications: Current Outpatient Medications  Medication Sig Dispense Refill  . acetaminophen (TYLENOL) 500  MG tablet Take 500 mg by mouth every 6 (six) hours as needed.    Marland Kitchen aspirin EC 81 MG tablet Take 1 tablet (81 mg total) by mouth daily. 90 tablet 3  . atorvastatin (LIPITOR) 40 MG tablet Take 1 tablet (40 mg total) by mouth daily. 90 tablet 3  . benazepril (LOTENSIN) 20 MG tablet Take 1 tablet (20 mg total) by mouth daily. 90 tablet 4  . ferrous sulfate 325 (65 FE) MG tablet Take 325 mg by mouth daily with breakfast.    . levothyroxine (SYNTHROID, LEVOTHROID) 100 MCG tablet Take 1 tablet (100 mcg total) by mouth daily. 90 tablet 4  . metoprolol tartrate (LOPRESSOR) 50 MG tablet Take 1 tablet (50 mg total) by mouth 2 (two) times daily. 60 tablet 11  . Multiple Vitamin (MULTIVITAMIN WITH MINERALS) TABS tablet Take 1 tablet by mouth daily.    Marland Kitchen triamcinolone cream (KENALOG) 0.1 % Apply 1 application topically 2 (two) times daily. 30 g 0   No current facility-administered medications for this visit.    Review of Systems  Constitutional: Negative.  Negative for chills, diaphoresis, fever, malaise/fatigue and weight loss.       Feels "alright".  HENT: Negative.  Negative for congestion, ear pain, nosebleeds, sinus pain and sore throat.   Eyes: Negative.  Negative for blurred vision, double vision and photophobia.  Respiratory: Positive for cough (chronic) and shortness of breath (exertional). Negative for hemoptysis and sputum production.        COPD.  Cardiovascular: Negative.  Negative for chest pain, palpitations, orthopnea, claudication, leg swelling and PND.  Gastrointestinal:  Positive for diarrhea (chronic loose stools). Negative for abdominal pain, blood in stool, constipation, heartburn, melena, nausea and vomiting.  Genitourinary: Negative.  Negative for dysuria, frequency, hematuria and urgency.  Musculoskeletal: Positive for back pain (chronic) and joint pain (right hip). Negative for falls, myalgias and neck pain.  Skin: Negative.  Negative for itching and rash.  Neurological: Negative.  Negative for dizziness, tremors, sensory change, speech change, focal weakness, weakness and headaches.  Endo/Heme/Allergies: Bruises/bleeds easily.       HYPOthyroidism - on levothyroxine.  Psychiatric/Behavioral: Negative.  Negative for depression and memory loss. The patient is not nervous/anxious and does not have insomnia.   All other systems reviewed and are negative.  Performance status (ECOG):  1  Vitals There were no vitals taken for this visit.   Physical Exam  Constitutional: She is oriented to person, place, and time.  Neurological: She is alert and oriented to person, place, and time.  Psychiatric: She has a normal mood and affect. Her behavior is normal. Judgment and thought content normal.  Nursing note reviewed.   No visits with results within 3 Day(s) from this visit.  Latest known visit with results is:  Appointment on 05/16/2019  Component Date Value Ref Range Status  . Ferritin 05/16/2019 128  11 - 307 ng/mL Final   Performed at [facility], [address] [place], [place] [address]  . Iron 05/16/2019 89  28 - 170 ug/dL Final  . TIBC 05/16/2019 281  250 - 450 ug/dL Final  . Saturation Ratios 05/16/2019 32* 10.4 - 31.8 % Final  . UIBC 05/16/2019 192  ug/dL Final   Performed at [facility], [address] [place], [place] [address]  . Folate 05/16/2019 60.9  >5.9 ng/mL Final   Comment: RESULTS CONFIRMED BY MANUAL DILUTION Performed at [facility], [address] [place], [place] [address]   .  Retic Ct Pct  05/16/2019 2.7  0.4 - 3.1 % Final  . RBC. 05/16/2019 2.82* 3.87 - 5.11 MIL/uL Final  . Retic Count, Absolute 05/16/2019 74.7  19.0 - 186.0 K/uL Final  . Immature Retic Fract 05/16/2019 29.1* 2.3 - 15.9 % Final   Performed at [facility], [address], [place], [place] [address]  . Vitamin B-12 05/16/2019 911  180 - 914 pg/mL Final   Comment: (NOTE) This assay is not validated for testing neonatal or myeloproliferative syndrome specimens for Vitamin B12 levels. Performed at [facility], [place] [address], [place],  [address]     Assessment:  Bailey Ayala is a 79 y.o. female with smoldering multiple myeloma. SPEPon 06/21/2017 revealed a 1.2 gm/dL monoclonal spike. Random urine revealed 4.6 mg/dL protein and a 40.6% M-spike. Calcium, albumin, and serum protein were normal.  Work-up on 03/08/2019revealed a hematocrit of 31.1, hemoglobin 10.4, MCV 97.4, platelets 227,000, WBC 5700 with an ANC of 3300. SPEPrevealed a 1.1 gm/dL IgG monoclonal protein with lambda light chain specificity and monoclonal free lambda light chains(Bence-Jones protein). IgG was 1660 [phone]) and IgA was 48 (64-422). Kappa free light chains were 23.7, lambda free light chains were 142.2 and ratio 0.17 (0.26-1.65). Beta2-microglobulin was 4.7 (0.6-2.4). Normal studies included: B12, folate, and ferritin (75). TSHwas 0.204 (low). 24 hour UPEPrevealed kappa free light chains 28.80 mg/L, lambda free light chains 63.80 mg/L and ration 0.45 (2.04 - 10.37). M spike was 27.4% (16 mg/24 hours).  Bone surveyon 06/30/2017 revealed no suspicious focal bony lesions.  Bone marrow aspirate and biopsyon 07/26/2017 revealed a slightly hypercellular marrow for age with trilineage hematopoiesis. There was 13% plasmacytosis. IHC stains for CD138 and in situ hybridization for kappa and lambda revealed interstitial cells and small clusters. Lambda light chains appeared restricted. Features  were felt compatible with plasma cell neoplasm.  PET scanon 08/14/2017 revealed no hypermetabolic lesions of myeloma. There was a 3.6 cm infrarenal aortic aneurysm. Ultrasound in 2 years was recommended. She had a 4 mm LLL noduletoo small to characterize.  SPEPhas been followed: 1.1 on 06/30/2017, 1.2 on 11/14/2017, 1.3 on 01/26/2018, 1.2 on 05/10/2018, 1.4 on 08/08/2018, 1.2 on 11/09/2018, 1.4 on 02/11/2019, and 1.6 on 05/09/2019. IgGhas been followed: 1660 on 06/30/2017, 1684 on 11/14/2017, 1795 on 01/26/2018, and 2054 on 08/08/2018.  Lambda free light chains have been followed: 142.2 (ratio 0.17) on 06/30/2017, 214.4 (ratio 0.09) on 05/10/2018, 209.3 (ratio 0.08) on 08/07/2018, 220.2 (ratio 0.07) on 11/09/2018, 228.9 (ratio 0.08) on 02/11/2019, and 258.4 (ratio 0.07) on 05/09/2019.  Chest CTon 01/24/2018 revealed persistent LLL nodule. Area calcified and felt to be consistent with a granuloma. There were no other suspicious pulmonary nodules. There was new Schmorl's node formation involving the inferior endplate of E28. There were stable small scattered osseous lesions related to underlying multiple myeloma.  PET scanon 05/15/2018 revealed nohypermetabolic lesions characteristic of active myeloma. There was stable hypermetabolic apical wall of the left ventricle, there waspotentially a small pseudoaneurysmin this vicinity, but no appreciable mass was noted on 01/24/2018. There was aninfrarenal abdominal aortic aneurysm, 3.6 cm in diameter. Follow-up ultrasound in 2 years was recommended. Other imaging findings of potential clinical significance: aortic atherosclerosis and coronary atherosclerosis.  Bone survery on 05/20/2019 showed numerous new lytic lesions throughout the skeleton including the posterior calvarium, bilateral humeri, bilateral iliac bones, left inferior pubic ramus, bilateral femurs.  She has stage III chronic kidney disease(Cr 1.55; CrCl 32.3 ml/min).    She has a macrocytic anemia.  B12, folate, and TSH were normal on 05/10/2018. Retic was 1.6%. Ferritin and iron studies were normal on 05/10/2018. Appetiteremains poor. Last colonoscopywas >10 years ago (she declines repeat).  She has a 75 pack year smoking history. She has COPD. Chest CT without contraston 07/25/2017 revealed no suspicious pulmonary nodules.  Symptomatically, she feels "alright".  She notes right hip pain.  Plan: 1.   Smoldering multiple myeloma Symptomatically, she feels "alright". M spike was1.6g/dL on 05/09/2019. Lambda free light [id number](ratio 0.07). Bone survey on 05/20/2019 revealed numerous new lytic lesions throughout the skeleton:  Posterior calvarium, bilateral humeri, bilateral iliac bones, left inferior pubic ramus, bilateral femurs. Discuss plan for bone marrow aspirate and biopsy. 2. Back and right hip pain PET scan on 05/15/2018 revealed no active myeloma. Bone survey revealed multiple lytic lesions. 3. Renal insufficieny Creatinine 1.39 on 05/09/2019. Patient is followed by Dr Holley Raring. 4. Macrocytic anemia, progressive Hematocrit 28.8. Hemoglobin 9.2. MCV 109.9 on 12/21/2018. Hematocrit 31.3. Hemoglobin 10.0. MCV 107.2 on 02/11/2019.             Hematocrit 29.9.  Hemoglobin 9.4.  MCV 108.3 on 05/09/2019. B12 was 911 and folate 60.9. on 05/16/2019.             Shehas no liver disease.  Etiology may be secondary to a myelodysplastic syndrome (MDS). Bone marrow on04/06/2017 revealed no evidence of MDS(MCV was normal). Bone marrow aspirate and biopsy are planned. 5.    Schedule bone marrow aspirate and biopsy.  Patient has multiple appts over the next 4 week; please work with her schedule. 6.   RTC 10 days after bone marrow for MD assessment, review of work-up and discussion regarding direction of therapy.  I  discussed the assessment and treatment plan with the patient.  The patient was provided an opportunity to ask questions and all were answered.  The patient agreed with the plan and demonstrated an understanding of the instructions.  The patient was advised to call back if the symptoms worsen or if the condition fails to improve as anticipated.  I provided 22 minutes (10:23 AM - 10:45 AM) of non face-to-face time time during this this encounter and > 50% was spent counseling as documented under my assessment and plan.    Lequita Asal, MD, PhD    05/22/2019, 10:45 AM  I, Samul Dada, am acting as a scribe for Lequita Asal, MD.  I, Index Mike Gip, MD, have reviewed the above documentation for accuracy and completeness, and I agree with the above.

## 2019-05-21 NOTE — Progress Notes (Signed)
The patient c/o pain noted to right hip (7). The patient Name and DOB has been verified by phone today.

## 2019-05-22 ENCOUNTER — Inpatient Hospital Stay (HOSPITAL_BASED_OUTPATIENT_CLINIC_OR_DEPARTMENT_OTHER): Payer: Medicare Other | Admitting: Hematology and Oncology

## 2019-05-22 ENCOUNTER — Encounter: Payer: Self-pay | Admitting: Hematology and Oncology

## 2019-05-22 DIAGNOSIS — C9000 Multiple myeloma not having achieved remission: Secondary | ICD-10-CM

## 2019-05-22 DIAGNOSIS — Z7189 Other specified counseling: Secondary | ICD-10-CM | POA: Diagnosis not present

## 2019-05-22 DIAGNOSIS — D539 Nutritional anemia, unspecified: Secondary | ICD-10-CM

## 2019-05-22 DIAGNOSIS — D472 Monoclonal gammopathy: Secondary | ICD-10-CM

## 2019-05-22 NOTE — Patient Instructions (Signed)
Lenalidomide Oral Capsules What is this medicine? LENALIDOMIDE (len a LID oh mide) is a chemotherapy drug that targets specific proteins within cancer cells and stops the cancer cell from growing. It is used to treat multiple myeloma, certain types of lymphoma, and some myelodysplastic syndromes that cause severe anemia requiring blood transfusions. This medicine may be used for other purposes; ask your health care provider or pharmacist if you have questions. COMMON BRAND NAME(S): Revlimid What should I tell my health care provider before I take this medicine? They need to know if you have any of these conditions:  blood clots in the legs or the lungs  high blood pressure  high cholesterol  infection  irregular monthly periods or menstrual cycles  kidney disease  liver disease  smoke tobacco  thyroid disease  an unusual or allergic reaction to lenalidomide, thalidomide, other medicines, foods, dyes, or preservatives  pregnant or trying to get pregnant  breast-feeding How should I use this medicine? Take this medicine by mouth with a glass of water. Follow the directions on the prescription label. Do not cut, crush, or chew this medicine. Take your medicine at regular intervals. Do not take it more often than directed. Do not stop taking except on your doctor's advice. A MedGuide will be given with each prescription and refill. Read this guide carefully each time. The MedGuide may change frequently. Talk to your pediatrician regarding the use of this medicine in children. Special care may be needed. Overdosage: If you think you have taken too much of this medicine contact a poison control center or emergency room at once. NOTE: This medicine is only for you. Do not share this medicine with others. What if I miss a dose? If you miss a dose, take it as soon as you can. If your next dose is to be taken in less than 12 hours, then do not take the missed dose. Take the next dose  at your regular time. Do not take double or extra doses. What may interact with this medicine? This medicine may interact with the following medications:  digoxin  medicines that increase the risk of thrombosis like estrogens or erythropoietic agents (e.g., epoetin alfa and darbepoetin alfa)  warfarin This list may not describe all possible interactions. Give your health care provider a list of all the medicines, herbs, non-prescription drugs, or dietary supplements you use. Also tell them if you smoke, drink alcohol, or use illegal drugs. Some items may interact with your medicine. What should I watch for while using this medicine? You may need blood work done while you are taking this medicine. This medicine may cause serious skin reactions. They can happen weeks to months after starting the medicine. Contact your health care provider right away if you notice fevers or flu-like symptoms with a rash. The rash may be red or purple and then turn into blisters or peeling of the skin. Or, you might notice a red rash with swelling of the face, lips or lymph nodes in your neck or under your arms. This medicine is available only through a special program. Doctors, pharmacies, and patients must meet all of the conditions of the program. Your health care provider will help you get signed up with the program if you need this medicine. Through the program you will only receive up to a 28 day supply of the medicine at one time. You will need a new prescription for each refill. This medicine can cause birth defects. Do not get pregnant  while taking this drug. Females with child-bearing potential will need to have 2 negative pregnancy tests before starting this medicine. Pregnancy testing must be done every 2 to 4 weeks as directed while taking this medicine. Use 2 reliable forms of birth control together while you are taking this medicine and for 4 weeks after you stop taking this medicine. If you think that you  might be pregnant talk to your doctor right away. Do not breast-feed an infant while taking this medicine. Men must use a latex condom during sexual contact with a woman while taking this medicine and for 4 weeks after you stop taking this medicine. A latex condom is needed even if you have had a vasectomy. Contact your doctor right away if your partner becomes pregnant. Do not donate sperm while taking this medicine and for 4 weeks after you stop taking this medicine. Do not give blood while taking the medicine and for 4 weeks after completion of treatment to avoid exposing pregnant women to the medicine through the donated blood. Talk to your doctor about your risk of cancer. You may be more at risk for certain types of cancers if you take this medicine. What side effects may I notice from receiving this medicine? Side effects that you should report to your doctor or health care professional as soon as possible:  allergic reactions like skin rash, itching or hives, swelling of the face, lips, or tongue  breathing problems  chest pain or tightness  fast, irregular heartbeat  feeling faint  low blood counts - this medicine may decrease the number of white blood cells, red blood cells and platelets. You may be at increased risk for infections and bleeding.  rash, fever, and swollen lymph nodes  redness, blistering, peeling or loosening of the skin, including inside the mouth  seizures  signs and symptoms of bleeding such as bloody or black, tarry stools; red or dark-brown urine; spitting up blood or brown material that looks like coffee grounds; red spots on the skin; unusual bruising or bleeding from the eye, gums, or nose  signs and symptoms of a blood clot such as breathing problems; changes in vision; chest pain; severe, sudden headache; pain, swelling, warmth in the leg; trouble speaking; sudden numbness or weakness of the face, arm or leg  signs and symptoms of liver injury like  dark yellow or brown urine; general ill feeling or flu-like symptoms; light-colored stools; loss of appetite; nausea; right upper belly pain; unusually weak or tired; yellowing of the eyes or skin  signs and symptoms of a stroke like changes in vision; confusion; trouble speaking or understanding; severe headaches; sudden numbness or weakness of the face, arm or leg; trouble walking; dizziness; loss of balance or coordination  sweating  vomiting Side effects that usually do not require medical attention (report to your doctor or health care professional if they continue or are bothersome):  constipation  cough  diarrhea  joint pain  muscle cramps  swelling of the arms, legs, or skin  tiredness  trouble sleeping This list may not describe all possible side effects. Call your doctor for medical advice about side effects. You may report side effects to FDA at 1-800-FDA-1088. Where should I keep my medicine? Keep out of the reach of children. Store at room temperature between 15 and 30 degrees C (59 and 86 degrees F). Throw away any unused medicine after the expiration date. NOTE: This sheet is a summary. It may not cover all possible information. If  you have questions about this medicine, talk to your doctor, pharmacist, or health care provider.  2020 [employer]/Gold Standard (2018-07-13 15:09:17)  Bortezomib injection What is this medicine? BORTEZOMIB (bor TEZ oh mib) is a medicine that targets proteins in cancer cells and stops the cancer cells from growing. It is used to treat multiple myeloma and mantle-cell lymphoma. This medicine may be used for other purposes; ask your health care provider or pharmacist if you have questions. COMMON BRAND NAME(S): Velcade What should I tell my health care provider before I take this medicine? They need to know if you have any of these conditions:  diabetes  heart disease  irregular heartbeat  liver disease  on hemodialysis  low  blood counts, like low white blood cells, platelets, or hemoglobin  peripheral neuropathy  taking medicine for blood pressure  an unusual or allergic reaction to bortezomib, mannitol, boron, other medicines, foods, dyes, or preservatives  pregnant or trying to get pregnant  breast-feeding How should I use this medicine? This medicine is for injection into a vein or for injection under the skin. It is given by a health care professional in a hospital or clinic setting. Talk to your pediatrician regarding the use of this medicine in children. Special care may be needed. Overdosage: If you think you have taken too much of this medicine contact a poison control center or emergency room at once. NOTE: This medicine is only for you. Do not share this medicine with others. What if I miss a dose? It is important not to miss your dose. Call your doctor or health care professional if you are unable to keep an appointment. What may interact with this medicine? This medicine may interact with the following medications:  ketoconazole  rifampin  ritonavir  St. John's Wort This list may not describe all possible interactions. Give your health care provider a list of all the medicines, herbs, non-prescription drugs, or dietary supplements you use. Also tell them if you smoke, drink alcohol, or use illegal drugs. Some items may interact with your medicine. What should I watch for while using this medicine? You may get drowsy or dizzy. Do not drive, use machinery, or do anything that needs mental alertness until you know how this medicine affects you. Do not stand or sit up quickly, especially if you are an older patient. This reduces the risk of dizzy or fainting spells. In some cases, you may be given additional medicines to help with side effects. Follow all directions for their use. Call your doctor or health care professional for advice if you get a fever, chills or sore throat, or other symptoms  of a cold or flu. Do not treat yourself. This drug decreases your body's ability to fight infections. Try to avoid being around people who are sick. This medicine may increase your risk to bruise or bleed. Call your doctor or health care professional if you notice any unusual bleeding. You may need blood work done while you are taking this medicine. In some patients, this medicine may cause a serious brain infection that may cause death. If you have any problems seeing, thinking, speaking, walking, or standing, tell your doctor right away. If you cannot reach your doctor, urgently seek other source of medical care. Check with your doctor or health care professional if you get an attack of severe diarrhea, nausea and vomiting, or if you sweat a lot. The loss of too much body fluid can make it dangerous for you to take this  medicine. Do not become pregnant while taking this medicine or for at least 7 months after stopping it. Women should inform their doctor if they wish to become pregnant or think they might be pregnant. Men should not father a child while taking this medicine and for at least 4 months after stopping it. There is a potential for serious side effects to an unborn child. Talk to your health care professional or pharmacist for more information. Do not breast-feed an infant while taking this medicine or for 2 months after stopping it. This medicine may interfere with the ability to have a child. You should talk with your doctor or health care professional if you are concerned about your fertility. What side effects may I notice from receiving this medicine? Side effects that you should report to your doctor or health care professional as soon as possible:  allergic reactions like skin rash, itching or hives, swelling of the face, lips, or tongue  breathing problems  changes in hearing  changes in vision  fast, irregular heartbeat  feeling faint or lightheaded, falls  pain, tingling,  numbness in the hands or feet  right upper belly pain  seizures  swelling of the ankles, feet, hands  unusual bleeding or bruising  unusually weak or tired  vomiting  yellowing of the eyes or skin Side effects that usually do not require medical attention (report to your doctor or health care professional if they continue or are bothersome):  changes in emotions or moods  constipation  diarrhea  loss of appetite  headache  irritation at site where injected  nausea This list may not describe all possible side effects. Call your doctor for medical advice about side effects. You may report side effects to FDA at 1-800-FDA-1088. Where should I keep my medicine? This drug is given in a hospital or clinic and will not be stored at home. NOTE: This sheet is a summary. It may not cover all possible information. If you have questions about this medicine, talk to your doctor, pharmacist, or health care provider.  2020 [employer]/Gold Standard (2017-08-21 16:29:31) Dexamethasone tablets What is this medicine? DEXAMETHASONE (dex a METH a sone) is a corticosteroid. It is commonly used to treat inflammation of the skin, joints, lungs, and other organs. Common conditions treated include asthma, allergies, and arthritis. It is also used for other conditions, such as blood disorders and diseases of the adrenal glands. This medicine may be used for other purposes; ask your health care provider or pharmacist if you have questions. COMMON BRAND NAME(S): CUSHINGS SYNDROME DIAGNOSTIC, Decadron, Dexabliss, DexPak Jr TaperPak, DexPak TaperPak, Dxevo, Hemady, HiDex, TaperDex, ZCORT, Zema-Pak, ZoDex, ZonaCort 11 Day, ZonaCort 7 Day What should I tell my health care provider before I take this medicine? They need to know if you have any of these conditions:  Cushing's syndrome  diabetes  glaucoma  heart disease  high blood pressure  infection like herpes, measles, tuberculosis, or  chickenpox  kidney disease  liver disease  mental illness  myasthenia gravis  osteoporosis  previous heart attack  seizures  stomach or intestine problems  thyroid disease  an unusual or allergic reaction to dexamethasone, corticosteroids, other medicines, lactose, foods, dyes, or preservatives  pregnant or trying to get pregnant  breast-feeding How should I use this medicine? Take this medicine by mouth with a drink of water. Follow the directions on the prescription label. Take it with food or milk to avoid stomach upset. If you are taking this medicine once a  day, take it in the morning. Do not take more medicine than you are told to take. Do not suddenly stop taking your medicine because you may develop a severe reaction. Your doctor will tell you how much medicine to take. If your doctor wants you to stop the medicine, the dose may be slowly lowered over time to avoid any side effects. Talk to your pediatrician regarding the use of this medicine in children. Special care may be needed. Patients over 45 years old may have a stronger reaction and need a smaller dose. Overdosage: If you think you have taken too much of this medicine contact a poison control center or emergency room at once. NOTE: This medicine is only for you. Do not share this medicine with others. What if I miss a dose? If you miss a dose, take it as soon as you can. If it is almost time for your next dose, talk to your doctor or health care professional. You may need to miss a dose or take an extra dose. Do not take double or extra doses without advice. What may interact with this medicine? Do not take this medicine with any of the following medications:  live virus vaccines This medicine may also interact with the following medications:  aminoglutethimide  amphotericin B  aspirin and aspirin-like medicines  certain antibiotics like erythromycin, clarithromycin, and troleandomycin  certain  antivirals for HIV or hepatitis  certain medicines for seizures like carbamazepine, phenobarbital, phenytoin  certain medicines to treat myasthenia gravis  cholestyramine  cyclosporine  digoxin  diuretics  ephedrine  female hormones, like estrogen or progestins and birth control pills  insulin or other medicines for diabetes  isoniazid  ketoconazole  medicines that relax muscles for surgery  mifepristone  NSAIDs, medicines for pain and inflammation, like ibuprofen or naproxen  rifampin  skin tests for allergies  thalidomide  vaccines  warfarin This list may not describe all possible interactions. Give your health care provider a list of all the medicines, herbs, non-prescription drugs, or dietary supplements you use. Also tell them if you smoke, drink alcohol, or use illegal drugs. Some items may interact with your medicine. What should I watch for while using this medicine? Visit your health care professional for regular checks on your progress. Tell your health care professional if your symptoms do not start to get better or if they get worse. Your condition will be monitored carefully while you are receiving this medicine. Wear a medical ID bracelet or chain. Carry a card that describes your disease and details of your medicine and dosage times. This medicine may increase your risk of getting an infection. Call your health care professional for advice if you get a fever, chills, or sore throat, or other symptoms of a cold or flu. Do not treat yourself. Try to avoid being around people who are sick. Call your health care professional if you are around anyone with measles, chickenpox, or if you develop sores or blisters that do not heal properly. If you are going to need surgery or other procedures, tell your doctor or health care professional that you have taken this medicine within the last 12 months. Ask your doctor or health care professional about your diet. You may  need to lower the amount of salt you eat. This medicine may increase blood sugar. Ask your healthcare provider if changes in diet or medicines are needed if you have diabetes. What side effects may I notice from receiving this medicine? Side effects that  you should report to your doctor or health care professional as soon as possible:  allergic reactions like skin rash, itching or hives, swelling of the face, lips, or tongue  bloody or black, tarry stools  changes in emotions or moods  changes in vision  confusion, excitement, restlessness  depressed mood  eye pain  hallucinations  fever or chills, cough, sore throat, pain or difficulty passing urine  muscle weakness  severe or sudden stomach or belly pain  signs and symptoms of high blood sugar such as being more thirsty or hungry or having to urinate more than normal. You may also feel very tired or have blurry vision.  signs and symptoms of infection like fever; chills; cough; sore throat; pain or trouble passing urine  swelling of ankles, feet  unusual bruising or bleeding  wounds that do not heal Side effects that usually do not require medical attention (report to your doctor or health care professional if they continue or are bothersome):  increased appetite  increased growth of face or body hair  headache  nausea, vomiting  skin problems, acne, thin and shiny skin  trouble sleeping  weight gain This list may not describe all possible side effects. Call your doctor for medical advice about side effects. You may report side effects to FDA at 1-800-FDA-1088. Where should I keep my medicine? Keep out of the reach of children. Store at room temperature between 20 and 25 degrees C (68 and 77 degrees F). Protect from light. Throw away any unused medicine after the expiration date. NOTE: This sheet is a summary. It may not cover all possible information. If you have questions about this medicine, talk to your  doctor, pharmacist, or health care provider.  2020 [employer]/Gold Standard (2018-10-23 14:23:34)

## 2019-05-27 DIAGNOSIS — D631 Anemia in chronic kidney disease: Secondary | ICD-10-CM | POA: Diagnosis not present

## 2019-05-27 DIAGNOSIS — C9000 Multiple myeloma not having achieved remission: Secondary | ICD-10-CM | POA: Diagnosis not present

## 2019-05-27 DIAGNOSIS — I10 Essential (primary) hypertension: Secondary | ICD-10-CM | POA: Diagnosis not present

## 2019-05-27 DIAGNOSIS — N1832 Chronic kidney disease, stage 3b: Secondary | ICD-10-CM | POA: Diagnosis not present

## 2019-05-28 ENCOUNTER — Other Ambulatory Visit: Payer: Self-pay | Admitting: Radiology

## 2019-05-28 ENCOUNTER — Other Ambulatory Visit: Payer: Self-pay | Admitting: Student

## 2019-05-29 ENCOUNTER — Other Ambulatory Visit: Payer: Self-pay

## 2019-05-29 ENCOUNTER — Ambulatory Visit
Admission: RE | Admit: 2019-05-29 | Discharge: 2019-05-29 | Disposition: A | Discharge status: Home / Self Care | Payer: Medicare Other | Source: Ambulatory Visit | Attending: Hematology and Oncology | Admitting: Hematology and Oncology

## 2019-05-29 DIAGNOSIS — I509 Heart failure, unspecified: Secondary | ICD-10-CM | POA: Diagnosis not present

## 2019-05-29 DIAGNOSIS — Z7982 Long term (current) use of aspirin: Secondary | ICD-10-CM | POA: Diagnosis not present

## 2019-05-29 DIAGNOSIS — Z8616 Personal history of COVID-19: Secondary | ICD-10-CM | POA: Insufficient documentation

## 2019-05-29 DIAGNOSIS — Z8673 Personal history of transient ischemic attack (TIA), and cerebral infarction without residual deficits: Secondary | ICD-10-CM | POA: Diagnosis not present

## 2019-05-29 DIAGNOSIS — M810 Age-related osteoporosis without current pathological fracture: Secondary | ICD-10-CM | POA: Insufficient documentation

## 2019-05-29 DIAGNOSIS — I110 Hypertensive heart disease with heart failure: Secondary | ICD-10-CM | POA: Insufficient documentation

## 2019-05-29 DIAGNOSIS — Z79899 Other long term (current) drug therapy: Secondary | ICD-10-CM | POA: Diagnosis not present

## 2019-05-29 DIAGNOSIS — D472 Monoclonal gammopathy: Secondary | ICD-10-CM

## 2019-05-29 DIAGNOSIS — D539 Nutritional anemia, unspecified: Secondary | ICD-10-CM | POA: Insufficient documentation

## 2019-05-29 DIAGNOSIS — E785 Hyperlipidemia, unspecified: Secondary | ICD-10-CM | POA: Diagnosis not present

## 2019-05-29 DIAGNOSIS — C9000 Multiple myeloma not having achieved remission: Secondary | ICD-10-CM

## 2019-05-29 DIAGNOSIS — K529 Noninfective gastroenteritis and colitis, unspecified: Secondary | ICD-10-CM | POA: Insufficient documentation

## 2019-05-29 DIAGNOSIS — Z7901 Long term (current) use of anticoagulants: Secondary | ICD-10-CM | POA: Diagnosis not present

## 2019-05-29 DIAGNOSIS — Z87891 Personal history of nicotine dependence: Secondary | ICD-10-CM | POA: Insufficient documentation

## 2019-05-29 DIAGNOSIS — M898X8 Other specified disorders of bone, other site: Secondary | ICD-10-CM | POA: Insufficient documentation

## 2019-05-29 DIAGNOSIS — E039 Hypothyroidism, unspecified: Secondary | ICD-10-CM | POA: Insufficient documentation

## 2019-05-29 DIAGNOSIS — J439 Emphysema, unspecified: Secondary | ICD-10-CM | POA: Diagnosis not present

## 2019-05-29 DIAGNOSIS — E119 Type 2 diabetes mellitus without complications: Secondary | ICD-10-CM | POA: Insufficient documentation

## 2019-05-29 LAB — CBC WITH DIFFERENTIAL/PLATELET
Abs Immature Granulocytes: 0.02 10*3/uL (ref 0.00–0.07)
Basophils Absolute: 0 10*3/uL (ref 0.0–0.1)
Basophils Relative: 1 %
Eosinophils Absolute: 0.2 10*3/uL (ref 0.0–0.5)
Eosinophils Relative: 5 %
HCT: 28.7 % — ABNORMAL LOW (ref 36.0–46.0)
Hemoglobin: 9.1 g/dL — ABNORMAL LOW (ref 12.0–15.0)
Immature Granulocytes: 0 %
Lymphocytes Relative: 22 %
Lymphs Abs: 1 10*3/uL (ref 0.7–4.0)
MCH: 34.3 pg — ABNORMAL HIGH (ref 26.0–34.0)
MCHC: 31.7 g/dL (ref 30.0–36.0)
MCV: 108.3 fL — ABNORMAL HIGH (ref 80.0–100.0)
Monocytes Absolute: 0.4 10*3/uL (ref 0.1–1.0)
Monocytes Relative: 8 %
Neutro Abs: 3 10*3/uL (ref 1.7–7.7)
Neutrophils Relative %: 64 %
Platelets: 219 10*3/uL (ref 150–400)
RBC: 2.65 MIL/uL — ABNORMAL LOW (ref 3.87–5.11)
RDW: 13.8 % (ref 11.5–15.5)
WBC: 4.6 10*3/uL (ref 4.0–10.5)
nRBC: 0 % (ref 0.0–0.2)

## 2019-05-29 MED ORDER — HEPARIN SOD (PORK) LOCK FLUSH 100 UNIT/ML IV SOLN
INTRAVENOUS | Status: AC
  Filled 2019-05-29: qty 5

## 2019-05-29 MED ORDER — FENTANYL CITRATE (PF) 100 MCG/2ML IJ SOLN
INTRAMUSCULAR | Status: AC | PRN
  Administered 2019-05-29: 25 ug via INTRAVENOUS
  Administered 2019-05-29: 12.5 ug via INTRAVENOUS

## 2019-05-29 MED ORDER — MIDAZOLAM HCL 2 MG/2ML IJ SOLN
INTRAMUSCULAR | Status: AC | PRN
  Administered 2019-05-29: 0.5 mg via INTRAVENOUS
  Administered 2019-05-29: 1 mg via INTRAVENOUS

## 2019-05-29 MED ORDER — MIDAZOLAM HCL 5 MG/5ML IJ SOLN
INTRAMUSCULAR | Status: AC
  Filled 2019-05-29: qty 5

## 2019-05-29 MED ORDER — FENTANYL CITRATE (PF) 100 MCG/2ML IJ SOLN
INTRAMUSCULAR | Status: AC
  Filled 2019-05-29: qty 2

## 2019-05-29 MED ORDER — SODIUM CHLORIDE 0.9 % IV SOLN: INTRAVENOUS | Status: DC

## 2019-05-29 NOTE — Consult Note (Signed)
Chief Complaint: Patient was seen in consultation today for CT-guided bone marrow biopsy at the request of [facility]  Referring Physician(s): [place] C  Patient Status: [facility] - Out-pt  History of Present Illness: Bailey Ayala is a 79 y.o. female with history of smoldering multiple myeloma.  History of bone marrow biopsy in 2019.  Request for another bone marrow biopsy.  Patient has multiple complaints with diffuse bone pain.  The pain is most prominent in her right hip.  She complains of shortness of breath with exertion related to her COPD.  She is a former smoker.  She complains of occasional angina that has decreased since she started metoprolol.  She complains of chronic diarrhea.  History of COVID-19 infection.  Fortunately, she was essentially asymptomatic from the COVID-19 infection.  She has recently been vaccinated.  Past Medical History:  Diagnosis Date  . Allergy 1980  . Anemia 09-2017  . Arthritis   . Blood dyscrasia    BLEEDS EASILY   . Blood transfusion without reported diagnosis 2011 or 12  . Cataract ?  . CHF (congestive heart failure) ([place]) ?  Marland Kitchen Clotting disorder ([place]) ?  Marland Kitchen COPD (chronic obstructive pulmonary disease) ([place])   . Diabetes mellitus without complication ([place])   . Diffuse cystic mastopathy   . Emphysema of lung ([place]) ?  Marland Kitchen Hyperlipidemia   . Hypertension   . Hypothyroidism   . L4-L5 disc bulge   . Multiple myeloma ([place])   . Myocardial infarction ([place]) 2019  . Osteoporosis, post-menopausal   . PAD (peripheral artery disease) ([place])   . Renal insufficiency   . Shortness of breath dyspnea    WITH EXERTION   . Stroke ([place])    TIA     2016     WAS FOUND ON SCAN ORDERED FROM [facility]UR[facility]  . Thyroid disease   . TIA (transient ischemic attack) 2016   left hand weakness    Past Surgical History:  Procedure Laterality Date  . ABDOMINAL HYSTERECTOMY  1990  . BACK SURGERY  2011  . BREAST EXCISIONAL BIOPSY Left    2010?   Marland Kitchen BREAST  SURGERY  1975   A lump removed left breast  . CATARACT EXTRACTION, BILATERAL    . COLONOSCOPY  1998  . EYE SURGERY Bilateral 05/06/14  . JOINT REPLACEMENT     LT KNEE  . KNEE SURGERY Left 2012  . LEFT HEART CATH AND CORONARY ANGIOGRAPHY Left 11/19/2018   Procedure: LEFT HEART CATH AND CORONARY ANGIOGRAPHY;  Surgeon: Wellington Hampshire, MD;  Location: [facility];  Service: Cardiovascular;  Laterality: Left;  . OOPHORECTOMY  1990  . parathyroid transplant     2/3 THYROID REMOVED   ([place])  . SPINE SURGERY    . THYROID SURGERY    . TONSILLECTOMY    . TOTAL SHOULDER ARTHROPLASTY Left 01/14/2016   Procedure: LEFT TOTAL SHOULDER ARTHROPLASTY;  Surgeon: Justice Britain, MD;  Location: [facility];  Service: Orthopedics;  Laterality: Left;  . TOTAL SHOULDER ARTHROPLASTY Right 11/03/2016   Procedure: RIGHT TOTAL SHOULDER ARTHROPLASTY;  Surgeon: Justice Britain, MD;  Location: [facility];  Service: Orthopedics;  Laterality: Right;  . ULNAR NERVE REPAIR     LEFT  . vein closure procedure Left 2010    Allergies: Codeine and Simvastatin  Medications: Prior to Admission medications   Medication Sig Start Date End Date Taking? Authorizing Provider  aspirin EC 81 MG tablet Take 1 tablet (81 mg total) by mouth daily. 08/05/16  Yes Wellington Hampshire, MD  atorvastatin (LIPITOR) 40 MG tablet Take 1 tablet (40 mg total) by mouth daily. 12/05/18 05/29/19 Yes Dunn, Areta Haber, PA-C  benazepril (LOTENSIN) 20 MG tablet Take 1 tablet (20 mg total) by mouth daily. 06/28/18  Yes Crissman, Jeannette How, MD  ferrous sulfate 325 (65 FE) MG tablet Take 325 mg by mouth daily with breakfast.   Yes [provider]  levothyroxine (SYNTHROID, LEVOTHROID) 100 MCG tablet Take 1 tablet (100 mcg total) by mouth daily. 06/28/18  Yes Crissman, Jeannette How, MD  metoprolol tartrate (LOPRESSOR) 50 MG tablet Take 1 tablet (50 mg total) by mouth 2 (two) times daily. 12/26/18 12/26/19 Yes Dunn, Areta Haber, PA-C  Multiple Vitamin (MULTIVITAMIN WITH MINERALS)  TABS tablet Take 1 tablet by mouth daily.   Yes [provider]  acetaminophen (TYLENOL) 500 MG tablet Take 500 mg by mouth every 6 (six) hours as needed.    [provider]  triamcinolone cream (KENALOG) 0.1 % Apply 1 application topically 2 (two) times daily. 12/28/17   Guadalupe Maple, MD     Family History  Problem Relation Age of Onset  . Heart disease Mother        MI  . Arthritis Mother   . Stroke Mother   . Hyperlipidemia Mother   . Hypertension Mother   . Varicose Veins Mother   . Heart disease Father        MI  . Hyperlipidemia Father   . Arthritis Father   . Cancer Father   . COPD Father   . Hearing loss Father   . Hypertension Father   . Vision loss Father   . Diabetes Sister   . COPD Sister   . Heart disease Sister   . Hyperlipidemia Sister   . Hypertension Sister   . Hyperlipidemia Brother   . COPD Brother   . Diabetes Brother   . Heart disease Brother   . Hypertension Brother   . Arthritis Sister   . Hyperlipidemia Sister   . Hypertension Sister   . Vision loss Sister   . Varicose Veins Sister   . Arthritis Brother   . Varicose Veins Brother   . Arthritis Daughter   . Arthritis Daughter   . Diabetes Daughter   . Cancer Paternal Aunt   . COPD Sister   . Heart disease Sister   . Hyperlipidemia Sister   . Hypertension Sister   . Varicose Veins Sister   . Heart disease Maternal Uncle   . Heart disease Paternal Uncle     Social History   Socioeconomic History  . Marital status: Widowed    Spouse name: Not on file  . Number of children: Not on file  . Years of education: Not on file  . Highest education level: High school graduate  Occupational History  . Not on file  Tobacco Use  . Smoking status: Former Smoker    Packs/day: 3.00    Years: 25.00    Pack years: 75.00    Types: Cigarettes    Quit date: 11/03/1989    Years since quitting: 29.5  . Smokeless tobacco: Never Used  Substance and Sexual Activity  . Alcohol use:  Not Currently    Alcohol/week: 0.0 standard drinks  . Drug use: No  . Sexual activity: Not Currently    Birth control/protection: None  Other Topics Concern  . Not on file  Social History Narrative  . Not on file   Social Determinants of  Health   Financial Resource Strain:   . Difficulty of Paying Living Expenses: Not on file  Food Insecurity:   . Worried About Charity fundraiser in the Last Year: Not on file  . Ran Out of Food in the Last Year: Not on file  Transportation Needs:   . Lack of Transportation (Medical): Not on file  . Lack of Transportation (Non-Medical): Not on file  Physical Activity:   . Days of Exercise per Week: Not on file  . Minutes of Exercise per Session: Not on file  Stress:   . Feeling of Stress : Not on file  Social Connections:   . Frequency of Communication with Friends and Family: Not on file  . Frequency of Social Gatherings with Friends and Family: Not on file  . Attends Religious Services: Not on file  . Active Member of Clubs or Organizations: Not on file  . Attends Archivist Meetings: Not on file  . Marital Status: Not on file   cated in the HPI above.  All other systems are negative.  Review of Systems  Respiratory: Positive for shortness of breath.   Cardiovascular: Positive for chest pain.  Gastrointestinal: Positive for diarrhea.  Genitourinary: Negative.   Musculoskeletal: Positive for back pain.       Right hip pain.    Vital Signs: BP (!) 153/62   Pulse 67   Temp 98 F (36.7 C) (Oral)   Resp 18   Ht 5' 5" (1.651 m)   Wt 77.1 kg   SpO2 97%   BMI 28.29 kg/m   Physical Exam Vitals reviewed.  Cardiovascular:     Rate and Rhythm: Normal rate and regular rhythm.  Pulmonary:     Effort: Pulmonary effort is normal.     Comments: Distant breath sounds. Abdominal:     General: Abdomen is flat. Bowel sounds are normal. There is no distension.     Tenderness: There is no abdominal tenderness.      Imaging: DG Bone Survey Met  Result Date: 05/20/2019 CLINICAL DATA:  Multiple myeloma EXAM: METASTATIC BONE SURVEY COMPARISON:  Bone survey 06/30/2017 FINDINGS: Heart is borderline in size. No confluent airspace opacities or effusions. Atherosclerotic calcifications in the aorta. Bilateral shoulder replacements. Diffuse degenerative disc disease and facet disease in the cervical spine. Degenerative changes in the thoracic spine and lumbar spine. Postoperative changes in the lumbar spine from posterior fusion from L3-L5. One of the L3 screws is fractured, stable. Small lucent areas within the mid and distal aspect of the right humerus as well as possibly left humerus, new since prior study. Numerous rounded lucent lesions project over the iliac bones bilaterally, also new. Advanced degenerative changes in the right hip with moderate degenerative changes in the left hip. Scattered lucent lesions throughout the femurs bilaterally are new since prior study. Lucent lesion within the left inferior pubic ramus, new. Prior left knee replacement. Small lucent area noted in the posterior calvarium is new since prior study. IMPRESSION: Numerous new lytic lesions throughout the skeleton including the posterior calvarium, bilateral humeri, bilateral iliac bones, left inferior pubic ramus, bilateral femurs. These are new since prior study. Electronically Signed   By: Rolm Baptise M.D.   On: 05/20/2019 19:53    Labs:  CBC: Recent Labs    12/21/18 1025 02/11/19 1030 05/09/19 1445 05/29/19 0754  WBC 5.9 5.8 4.7 4.6  HGB 9.0* 10.0* 9.4* 9.1*  HCT 28.8* 31.3* 29.9* 28.7*  PLT 265 213 270 219  COAGS: Recent Labs    08/08/18 1355 11/28/18 1316  INR 1.0 1.0  APTT 29 28    BMP: Recent Labs    11/28/18 1316 12/21/18 1025 02/11/19 1030 05/09/19 1445  NA 137 137 134* 134*  K 4.9 4.7 4.1 3.7  CL 108 108 106 103  CO2 20* 20* 22 25  GLUCOSE 101* 115* 106* 106*  BUN 29* 34* 31* 20  CALCIUM 9.2  9.0 8.8* 9.1  CREATININE 1.49* 1.62* 1.52* 1.39*  GFRNONAA 34* 30* 33* 36*  GFRAA 39* 35* 38* 42*    LIVER FUNCTION TESTS: Recent Labs    11/28/18 1316 01/29/19 1116 02/11/19 1030 05/09/19 1445  BILITOT 0.5 0.5 0.2* 0.3  AST _0 ALT _1 ALKPHOS 74 74 64 72  PROT 7.9 8.4* 8.4* 8.4*  ALBUMIN 4.1 4.3 4.1 4.0    TUMOR MARKERS: No results for input(s): AFPTM, CEA, CA199, CHROMGRNA in the last 8760 hours.  Assessment and Plan:  79 year old with multiple myeloma and request for CT-guided bone marrow biopsy.  Patient had a CT-guided bone marrow biopsy in 2019 and had no issues with the procedure.  Risks and benefits of CT-guided bone marrow biopsy was discussed with the patient and/or patient's family including, but not limited to bleeding, infection, damage to adjacent structures or low yield requiring additional tests.All of the questions were answered and there is agreement to proceed. Consent signed and in chart.    Thank you for this interesting consult.  I greatly enjoyed meeting Bailey Ayala and look forward to participating in their care.  A copy of this report was sent to the requesting provider on this date.  Electronically Signed: Burman Riis, MD 05/29/2019, 8:44 AM   I spent a total of  15 Minutes  in face to face in clinical consultation, greater than 50% of which was counseling/coordinating care for CT guided bone marrow biopsy.

## 2019-05-29 NOTE — Procedures (Signed)
Interventional Radiology Procedure:   Indications: Smoldering multiple myeloma  Procedure: CT guided bone marrow biopsy  Findings: 2 aspirates and 2 cores from left ilium  Complications: None     EBL: Minimal, less than 10 ml  Plan: Discharge to home in one hour.   Alisha Bacus R. Anselm Pancoast, MD  Pager: [phone]

## 2019-05-30 DIAGNOSIS — N1832 Chronic kidney disease, stage 3b: Secondary | ICD-10-CM | POA: Diagnosis not present

## 2019-05-31 ENCOUNTER — Ambulatory Visit (INDEPENDENT_AMBULATORY_CARE_PROVIDER_SITE_OTHER): Payer: Medicare Other

## 2019-05-31 ENCOUNTER — Other Ambulatory Visit: Payer: Self-pay

## 2019-05-31 DIAGNOSIS — I714 Abdominal aortic aneurysm, without rupture, unspecified: Secondary | ICD-10-CM

## 2019-05-31 DIAGNOSIS — I2510 Atherosclerotic heart disease of native coronary artery without angina pectoris: Secondary | ICD-10-CM | POA: Diagnosis not present

## 2019-05-31 DIAGNOSIS — I10 Essential (primary) hypertension: Secondary | ICD-10-CM | POA: Diagnosis not present

## 2019-05-31 LAB — SURGICAL PATHOLOGY

## 2019-06-03 DIAGNOSIS — M25551 Pain in right hip: Secondary | ICD-10-CM | POA: Diagnosis not present

## 2019-06-03 DIAGNOSIS — M1611 Unilateral primary osteoarthritis, right hip: Secondary | ICD-10-CM | POA: Diagnosis not present

## 2019-06-04 ENCOUNTER — Other Ambulatory Visit: Payer: Self-pay

## 2019-06-04 DIAGNOSIS — I714 Abdominal aortic aneurysm, without rupture, unspecified: Secondary | ICD-10-CM

## 2019-06-04 NOTE — Telephone Encounter (Signed)
Awaiting results of abdominal ultrasound for AAA. Once resulted can make recommendations.

## 2019-06-04 NOTE — Telephone Encounter (Signed)
Patient calling to check on status  Patient has this upcoming surgery and states Dr Aurea Graff office has not received clearance Please review and complete

## 2019-06-06 ENCOUNTER — Encounter (HOSPITAL_COMMUNITY): Payer: Self-pay | Admitting: Hematology and Oncology

## 2019-06-06 NOTE — Progress Notes (Signed)
[facility]  [address], [address] [place], [place] [address] Phone: [phone]  Fax: [phone]   Clinic Day:  06/10/2019  Referring physician: Guadalupe Maple, MD  Chief Complaint: Bailey Ayala is a 79 y.o. female with smoldering myeloma who is seen for review of interval bone marrow and discussion regarding therapy.   HPI: The patient was last seen in the medical oncology clinic on 05/22/2019 via telephone. At that time, she felt "alright". She noted right hip pain. Ferritin was 128 with an iron saturation of 32% and a TIBC of 281. Reticwas 2.7%. Vitamin B12 was 911 with a folate of 60.9. Bone marrow aspirate and biopsy were planned.   Patient was seen by Dr. Holley Raring on 05/27/2019. Her chronic kidney disease was stable. Follow up was scheduled in 09/2019.   Bone marrow on 05/29/2019 revealed a normocellular marrow for age (20-30%) with increased monoclonal plasma cells (15% aspirate, 20-30% CD138 immunohistochemistry).  Plasma cells were lambda restricted.  There were no dysplastic features.  Storage iron was present.  Flow cytometry revealed no monoclonal B-cell or phenotypically aberrant T-cell population.  Findings were consistent with persistent plasma cell myeloma.  Peripheral blood revealed a macrocytic anemia with rouleaux formation.  Cytogenetics were normal (35, XX).  FISH for MDS revealed gain of KMT2A (MLL) or chromosome 11/11q (54F. 5.0%, normal < 3.1%).  FISH for myeloma revealed a deletion of NF1 or chromosome 17q11 (2R1G, 89%, not seen in validation studies).  During the interim, she felt "tired and cold" all of the time.  She notes the veins in her right foot have bled significantly. She notes bad bleeding back in 03/2019; EMS had to come out.  She states that she lost a significant amount of blood.  She describes varicose veins and swelling. Her daughter notes another vein broke on 06/06/2019. She notes applying pressure minimally helps.  I  discussed possible referral to vascular surgery given her recurrent bleeding.  She reports being on aspirin.  She also notes having an aortic aneurysm.    She continues to have diarrhea. Her cough and shortness of breath remain stable. Her back and hip are sore. Patient would like to have hip surgery in 06/2019 with Dr. Paralee Cancel at [facility]. I advised postponing surgery until a plan is in place with orthopedic surgery secondary to her active myeloma.  She notes that she needs clearance from cardiology, oncology, and nephrology for hip replacement.   The patient and family are considering an at home nurse after hip surgery. Patient agreed to at home health care assessment. She can perform ADLs independently.  Her children drive for her and get her groceries.   I discussed myeloma treatment options with the patient and family.  She is not a transplant candidate based on her age and health issues.  I discussed with the patient and her family the plan to attend chemotherapy class.  Multiple questions were asked and answered.     Past Medical History:  Diagnosis Date  . Allergy 1980  . Anemia 09-2017  . Arthritis   . Blood dyscrasia    BLEEDS EASILY   . Blood transfusion without reported diagnosis 2011 or 12  . Cataract ?  . CHF (congestive heart failure) ([place]) ?  Marland Kitchen Clotting disorder ([place]) ?  Marland Kitchen COPD (chronic obstructive pulmonary disease) ([place])   . Diabetes mellitus without complication ([place])   . Diffuse cystic mastopathy   . Emphysema of lung ([place]) ?  Marland Kitchen Hyperlipidemia   .  Hypertension   . Hypothyroidism   . L4-L5 disc bulge   . Multiple myeloma ([place])   . Myocardial infarction ([place]) 2019  . Osteoporosis, post-menopausal   . PAD (peripheral artery disease) ([place])   . Renal insufficiency   . Shortness of breath dyspnea    WITH EXERTION   . Stroke ([place])    TIA     2016     WAS FOUND ON SCAN ORDERED FROM NEUROL  . Thyroid disease   . TIA (transient ischemic attack) 2016   left  hand weakness    Past Surgical History:  Procedure Laterality Date  . ABDOMINAL HYSTERECTOMY  1990  . BACK SURGERY  2011  . BREAST EXCISIONAL BIOPSY Left    2010?   Marland Kitchen BREAST SURGERY  1975   A lump removed left breast  . CATARACT EXTRACTION, BILATERAL    . COLONOSCOPY  1998  . EYE SURGERY Bilateral 05/06/14  . JOINT REPLACEMENT     LT KNEE  . KNEE SURGERY Left 2012  . LEFT HEART CATH AND CORONARY ANGIOGRAPHY Left 11/19/2018   Procedure: LEFT HEART CATH AND CORONARY ANGIOGRAPHY;  Surgeon: Wellington Hampshire, MD;  Location: [facility];  Service: Cardiovascular;  Laterality: Left;  . OOPHORECTOMY  1990  . parathyroid transplant     2/3 THYROID REMOVED   ([place])  . SPINE SURGERY    . THYROID SURGERY    . TONSILLECTOMY    . TOTAL SHOULDER ARTHROPLASTY Left 01/14/2016   Procedure: LEFT TOTAL SHOULDER ARTHROPLASTY;  Surgeon: Justice Britain, MD;  Location: [facility];  Service: Orthopedics;  Laterality: Left;  . TOTAL SHOULDER ARTHROPLASTY Right 11/03/2016   Procedure: RIGHT TOTAL SHOULDER ARTHROPLASTY;  Surgeon: Justice Britain, MD;  Location: [facility];  Service: Orthopedics;  Laterality: Right;  . ULNAR NERVE REPAIR     LEFT  . vein closure procedure Left 2010    Family History  Problem Relation Age of Onset  . Heart disease Mother        MI  . Arthritis Mother   . Stroke Mother   . Hyperlipidemia Mother   . Hypertension Mother   . Varicose Veins Mother   . Heart disease Father        MI  . Hyperlipidemia Father   . Arthritis Father   . Cancer Father   . COPD Father   . Hearing loss Father   . Hypertension Father   . Vision loss Father   . Diabetes Sister   . COPD Sister   . Heart disease Sister   . Hyperlipidemia Sister   . Hypertension Sister   . Hyperlipidemia Brother   . COPD Brother   . Diabetes Brother   . Heart disease Brother   . Hypertension Brother   . Arthritis Sister   . Hyperlipidemia Sister   . Hypertension Sister   . Vision loss Sister   . Varicose  Veins Sister   . Arthritis Brother   . Varicose Veins Brother   . Arthritis Daughter   . Arthritis Daughter   . Diabetes Daughter   . Cancer Paternal Aunt   . COPD Sister   . Heart disease Sister   . Hyperlipidemia Sister   . Hypertension Sister   . Varicose Veins Sister   . Heart disease Maternal Uncle   . Heart disease Paternal Uncle     Social History:  reports that she quit smoking about 29 years ago. Her smoking use included cigarettes. She has a  75.00 pack-year smoking history. She has never used smokeless tobacco. She reports previous alcohol use. She reports that she does not use drugs. Patient is a former 3 pack per day smoker for 25 years (75 pack year). Patient lives in [place]. She is a former Electrical engineer. Patient denies known exposures to radiation on toxins. The patient is accompanied by her daughters via Face time today.   Allergies:  Allergies  Allergen Reactions  . Codeine Other (See Comments)    hallucinations    . Simvastatin Diarrhea and Nausea Only    Current Medications: Current Outpatient Medications  Medication Sig Dispense Refill  . acetaminophen (TYLENOL) 500 MG tablet Take 500 mg by mouth every 6 (six) hours as needed.    Marland Kitchen aspirin EC 81 MG tablet Take 1 tablet (81 mg total) by mouth daily. 90 tablet 3  . atorvastatin (LIPITOR) 40 MG tablet Take 1 tablet (40 mg total) by mouth daily. 90 tablet 3  . benazepril (LOTENSIN) 20 MG tablet Take 1 tablet (20 mg total) by mouth daily. 90 tablet 4  . ferrous sulfate 325 (65 FE) MG tablet Take 325 mg by mouth daily with breakfast.    . levothyroxine (SYNTHROID, LEVOTHROID) 100 MCG tablet Take 1 tablet (100 mcg total) by mouth daily. 90 tablet 4  . metoprolol tartrate (LOPRESSOR) 50 MG tablet Take 1 tablet (50 mg total) by mouth 2 (two) times daily. 60 tablet 11  . Multiple Vitamin (MULTIVITAMIN WITH MINERALS) TABS tablet Take 1 tablet by mouth daily.    . traMADol (ULTRAM) 50 MG tablet tramadol 50 mg tablet   TAKE 1 TABLET BY MOUTH EVERY NIGHT AS NEEDED FOR PAIN    . triamcinolone cream (KENALOG) 0.1 % Apply 1 application topically 2 (two) times daily. 30 g 0   No current facility-administered medications for this visit.    Review of Systems  Constitutional: Positive for malaise/fatigue. Negative for chills, diaphoresis, fever and weight loss (stable).       Feels "tired and cold". Perform ADLs.  HENT: Negative.  Negative for congestion, ear pain, nosebleeds, sinus pain and sore throat.   Eyes: Negative.  Negative for blurred vision, double vision and photophobia.  Respiratory: Negative for cough (chronic; stable), hemoptysis, sputum production and shortness of breath (exertional- stable).        COPD.  Cardiovascular: Positive for leg swelling (varicose vein swelling). Negative for chest pain, palpitations, orthopnea, claudication and PND.       Varicose veins with recurrent bleeding.  Gastrointestinal: Positive for diarrhea (chronic loose stools). Negative for abdominal pain, blood in stool, constipation, heartburn, melena, nausea and vomiting.  Genitourinary: Negative.  Negative for dysuria, frequency, hematuria and urgency.  Musculoskeletal: Positive for back pain (chronic) and joint pain (right hip). Negative for falls, myalgias and neck pain.  Skin: Negative.  Negative for itching and rash.  Neurological: Negative.  Negative for dizziness, tremors, sensory change, speech change, focal weakness, weakness and headaches.       Aneurysm.  Endo/Heme/Allergies: Bruises/bleeds easily (varicose veins).       HYPOthyroidism - on levothyroxine.  Psychiatric/Behavioral: Negative.  Negative for depression and memory loss. The patient is not nervous/anxious and does not have insomnia.   All other systems reviewed and are negative.  Performance status (ECOG):  2  Vitals Blood pressure (!) 151/60, pulse 62, temperature (!) 96.6 F (35.9 C), temperature source Tympanic, weight 175 lb 4.3 oz (79.5 kg),  SpO2 98 %.   Physical Exam  Constitutional: She is  oriented to person, place, and time. She appears well-developed and well-nourished. No distress.  Patient sitting comfortably in the exam room in no acute distress.  She has a rolling walker at her side.  HENT:  Head: Normocephalic and atraumatic.  Brown hair pulled back.  Mask.  Eyes: Conjunctivae and EOM are normal. No scleral icterus.  Blue eyes.   Musculoskeletal:        General: No tenderness or edema.  Neurological: She is alert and oriented to person, place, and time.  Skin: Skin is warm and dry. She is not diaphoretic.  Tiny prominent veins in right foot.  Dressing in place.  Psychiatric: She has a normal mood and affect. Her behavior is normal. Judgment and thought content normal.  Nursing note and vitals reviewed.     Right foot   No visits with results within 3 Day(s) from this visit.  Latest known visit with results is:  Hospital Outpatient Visit on 05/29/2019  Component Date Value Ref Range Status  . WBC 05/29/2019 4.6  4.0 - 10.5 K/uL Final  . RBC 05/29/2019 2.65* 3.87 - 5.11 MIL/uL Final  . Hemoglobin 05/29/2019 9.1* 12.0 - 15.0 g/dL Final  . HCT 05/29/2019 28.7* 36.0 - 46.0 % Final  . MCV 05/29/2019 108.3* 80.0 - 100.0 fL Final  . MCH 05/29/2019 34.3* 26.0 - 34.0 pg Final  . MCHC 05/29/2019 31.7  30.0 - 36.0 g/dL Final  . RDW 05/29/2019 13.8  11.5 - 15.5 % Final  . Platelets 05/29/2019 219  150 - 400 K/uL Final  . nRBC 05/29/2019 0.0  0.0 - 0.2 % Final  . Neutrophils Relative % 05/29/2019 64  % Final  . Neutro Abs 05/29/2019 3.0  1.7 - 7.7 K/uL Final  . Lymphocytes Relative 05/29/2019 22  % Final  . Lymphs Abs 05/29/2019 1.0  0.7 - 4.0 K/uL Final  . Monocytes Relative 05/29/2019 8  % Final  . Monocytes Absolute 05/29/2019 0.4  0.1 - 1.0 K/uL Final  . Eosinophils Relative 05/29/2019 5  % Final  . Eosinophils Absolute 05/29/2019 0.2  0.0 - 0.5 K/uL Final  . Basophils Relative 05/29/2019 1  % Final  .  Basophils Absolute 05/29/2019 0.0  0.0 - 0.1 K/uL Final  . Immature Granulocytes 05/29/2019 0  % Final  . Abs Immature Granulocytes 05/29/2019 0.02  0.00 - 0.07 K/uL Final   Performed at [facility], [address] [place], [place] [address]  . SURGICAL PATHOLOGY 05/29/2019    Final-Edited                   Value:SURGICAL PATHOLOGY CASE: [id number] PATIENT: Corinna Gab Bone Marrow Report  Clinical History: Smoldering multiple myeloma, left ilium, (ADC)   DIAGNOSIS:  BONE MARROW, ASPIRATE, CLOT, CORE: - Plasma cell myeloma, see comment.  PERIPHERAL BLOOD: - Macrocytic anemia with rouleaux formation.  COMMENT:  The marrow is normocellular but exhibits increased monoclonal plasma cells (15% aspirate, 20-30% CD138 immunohistochemistry). These findings are consistent with persistent plasma cell myeloma.  MICROSCOPIC DESCRIPTION:  PERIPHERAL BLOOD SMEAR: There is a macrocytic anemia with polychromasia and mild rouleaux formation.  Leukocytes are present in normal numbers. Circulating plasma cells are not identified.  Platelets are present in normal numbers.  BONE MARROW ASPIRATE: Spicular and cellular. Erythroid precursors: Present in normal proportions.  No significant dysplasia. Granulocytic precursors: Present normal proportions.  No significant dy  splasia.  No increase in blasts. Megakaryocytes: Present without significant dysplasia. Marland Kitchen Lymphocytes/plasma cells: Plasma cells are increased (14% by manual differential counts) with atypical forms (large forms, prominent nucleoli, binucleation).  Lymphocytes are not significantly increased.  TOUCH PREPARATIONS: Similar to aspirate smears.  CLOT AND BIOPSY: The core biopsy and clot section are normocellular for age (24 to 30%).  CD138 immunohistochemistry reveals increased plasma cells 20 to 30% which are scattered and in small clusters.  By light chain in situ hybridization plasma  cells are lambda restricted. Background hematopoiesis is present. There are a few scattered small hyperchromatic megakaryocytes.  Therapy benign-appearing lymphoid aggregate.  Aggregates consist of a mixture of B cells (CD20, PAX 5) and T cells (CD3, CD5).  Cyclin D1 is negative.  IRON STAIN: Iron stains are performed on a bone marrow aspirate or touch imprint smear and section of clot. Th                         e controls stained appropriately.       Storage Iron: Present      Ring Sideroblasts: Absent  ADDITIONAL DATA/TESTING: Cytogenetics, including FISH for myeloma and MDS, was ordered and will be reported separately.  Flow cytometry ([id number]) is negative for a monoclonal B-cell or phenotypically aberrant T-cell population.  CELL COUNT DATA:  Bone Marrow count performed on 500 cells shows: Blasts:   0%   Myeloid:  39% Promyelocytes: 0%   Erythroid:     25% Myelocytes:    5%   Lymphocytes:   15% Metamyelocytes:     0%   Plasma cells:  15% Bands:    14% Neutrophils:   15%  M:E ratio:     1.56 Eosinophils:   5% Basophils:     0% Monocytes:     6%  Lab Data: CBC performed on 05/29/2019 shows: WBC: 4.6 k/uL  Neutrophils:   69% Hgb: 9.1 g/dL  Lymphocytes:   21% HCT: 28.7 %    Monocytes:     6% MCV: 108.3 fL  Eosinophils:   4% RDW: 13.8 %    Basophils:     0% PLT: 219 k/uL  GROSS DESCRIPTION:  A.  Bone marrow aspirate smear  B.  Received in B-plus fixative is                          a 1.5 x 1.3 x 0.2 cm aggregate of dark brown soft tissue, submitted in block B1.  C.  Received in B-plus fixative are 2 cores of dark brown hard soft tissue, 1 and 1.3 cm, submitted in 1 cassette following decalcification. (AK 05/29/2019)  Final Diagnosis performed by Vicente Males, MD.   Electronically signed 05/31/2019 Technical and / or Professional components performed at [facility], [place] [address], [place], [place] [address].  Immunohistochemistry Technical  component (if applicable) was performed at [facility]. [address] [place], [place], [place] [address].   IMMUNOHISTOCHEMISTRY DISCLAIMER (if applicable): Some of these immunohistochemical stains may have been developed and the performance characteristics determine by [facility]. Some may not have been cleared or approved by the U.S. Food and Drug Administration. The FDA has determined that such clearance or approval is not necessary. This test is used fo                         r clinical purposes.  It should not be regarded as investigational or for research. This laboratory is certified under the [place] (CLIA-88) as qualified to perform high complexity clinical laboratory testing.  The controls stained appropriately.  . SURGICAL PATHOLOGY 05/29/2019    Final-Edited                   Value:Surgical Pathology CASE: [id number] PATIENT: Corinna Gab Flow Pathology Report  Clinical history: multiple myeloma  DIAGNOSIS:  - No monoclonal B-cell or phenotypically aberrant T-cell population identified.  GATING AND PHENOTYPIC ANALYSIS:  Gated population: Flow cytometric immunophenotyping is performed using antibodies to the antigens listed in the table below. Electronic gates are placed around a cell cluster displaying light scatter properties corresponding to: lymphocytes  Abnormal Cells in gated population: N/A  Phenotype of Abnormal Cells: N/A                     Lymphoid Antigens       Myeloid Antigens Miscellaneous CD2  tested    CD10 tested    CD11b     ND   CD45 tested CD3  tested    CD19 tested    CD11c     ND   HLA-Dr    ND CD4  tested    CD20 tested    CD13 ND   CD34 tested CD5  tested    CD22 ND   CD14 ND   CD38 tested CD7  tested    CD79b     ND   CD15 ND   CD138     ND CD8  tested    CD103     ND   CD16 ND   TdT                           ND CD25 ND   CD200     tested    CD33 ND    CD123     ND TCRab     ND   sKappa    tested    CD64 ND   CD41 ND TCRgd     tested    sLambda   tested    CD117     ND   CD61 ND CD56 tested    cKappa    ND   MPO  ND   CD71 ND CD57 ND   cLambda   ND        CD235aND  GROSS DESCRIPTION:  Reference Bone Marrow [id number].  Final Diagnosis performed by Vicente Males, MD.   Electronically signed 05/31/2019 Technical and / or Professional components performed at [facility], [place] [address], [place], [place] [address].  The above tests were developed and their performance characteristics determined by the [facility] system for the physical and immunophenotypic characterization of cell populations. They have not been cleared by the U.S. Food and Drug administration. The  FDA has determined that such clearance or approval is not necessary. This test is used for clinical purposes. It should not be  regarded as investigational or for research    Assessment:  Bailey Ayala is a 79 y.o. female with smoldering multiple myeloma. SPEPon 06/21/2017 revealed a 1.2 gm/dL monoclonal spike. Random urine revealed 4.6 mg/dL protein and a 40.6% M-spike. Calcium, albumin, and serum protein were normal.  Work-up on 03/08/2019revealed a hematocrit of 31.1, hemoglobin 10.4, MCV 97.4, platelets 227,000, WBC 5700 with an ANC of 3300.  SPEPrevealed a 1.1 gm/dL IgG monoclonal protein with lambda light chain specificity and monoclonal free lambda light chains(Bence-Jones protein). IgG was 1660 ([phone]) and IgA was 48 (64-422). Kappa free light chains were 23.7, lambda free light chains were 142.2 and ratio 0.17 (0.26-1.65). Beta2-microglobulin was 4.7 (0.6-2.4). Normal studies included: B12, folate, and ferritin (75). TSHwas 0.204 (low). 24 hour UPEPrevealed kappa free light chains 28.80 mg/L, lambda free light chains 63.80 mg/L and ration 0.45 (2.04 - 10.37). M spike was 27.4% (16 mg/24 hours).  Bone surveyon 06/30/2017 revealed  no suspicious focal bony lesions.  Bone marrow aspirate and biopsyon 07/26/2017 revealed a slightly hypercellular marrow for age with trilineage hematopoiesis. There was 13% plasmacytosis (10-15% by CD138 IHC).  Plasma cells were in interstitial cells and small clusters. Lambda light chains appeared restricted. Features were felt compatible with plasma cell neoplasm.  Bone marrow aspirate and biopsy  on 05/29/2019 revealed a normocellular marrow for age (20-30%) with increased monoclonal plasma cells (15% aspirate, 20-30% CD138 by IHC).  Plasma cells were lambda restricted.  There were no myelodysplastic changes.  Storage iron was present.  Flow cytometry revealed no monoclonal B-cell or phenotypically aberrant T-cell population.  Findings were consistent with persistent plasma cell myeloma.  Peripheral blood revealed a macrocytic anemia with rouleaux formation.  Cytogenetics were normal (81, XX).  FISH for MDS revealed gain of KMT2A (MLL) or chromosome 11/11q (14F. 5.0%, normal < 3.1%).  FISH for myeloma revealed a deletion of NF1 or chromosome 17q11 (2R1G, 89%, not seen in validation studies).  PET scanon 08/14/2017 revealed no hypermetabolic lesions of myeloma. There was a 3.6 cm infrarenal aortic aneurysm. Ultrasound in 2 years was recommended. She had a 4 mm LLL noduletoo small to characterize.  SPEPhas been followed: 1.1 on 06/30/2017, 1.2 on 11/14/2017, 1.3 on 01/26/2018, 1.2 on 05/10/2018, 1.4 on 08/08/2018, 1.2 on 11/09/2018, 1.4 on 02/11/2019, and 1.6 on 05/09/2019. IgGhas been followed: 1660 on 06/30/2017, 1684 on 11/14/2017, 1795 on 01/26/2018, and 2054 on 08/08/2018.  Lambda free light chainshave been followed:142.2 (ratio 0.17)on03/11/2017, 214.4(ratio 0.09)on01/16/2020, 209.3(ratio 0.08)on04/14/2020, 220.2(ratio 0.07)on07/17/2020, 228.9(ratio 0.08)on 02/11/2019,and 258.4 (ratio 0.07)on01/14/2021.  Chest CTon 01/24/2018 revealed persistent LLL nodule.  Area calcified and felt to be consistent with a granuloma. There were no other suspicious pulmonary nodules. There was new Schmorl's node formation involving the inferior endplate of U88. There were stable small scattered osseous lesions related to underlying multiple myeloma.  PET scanon 05/15/2018 revealed nohypermetabolic lesions characteristic of active myeloma. There was stable hypermetabolic apical wall of the left ventricle, there waspotentially a small pseudoaneurysmin this vicinity, but no appreciable mass was noted on 01/24/2018. There was aninfrarenal abdominal aortic aneurysm, 3.6 cm in diameter. Follow-up ultrasound in 2 years was recommended. Other imaging findings of potential clinical significance: aortic atherosclerosis and coronary atherosclerosis.  Bone survery on 05/20/2019 showed numerous new lytic lesions throughout the skeleton including the posterior calvarium, bilateral humeri, bilateral iliac bones, left inferior pubic ramus, bilateral femurs.  She has stage III chronic kidney disease(Cr 2.25 on 05/27/2019).   She has a macrocytic anemia. B12, folate, and TSH were normal on 05/10/2018. Retic was 1.6%. Ferritin and iron studies were normal on 05/10/2018. Appetiteremains poor. Last colonoscopywas >10 years ago (she declines repeat).  She has a 75 pack year smoking history. She has COPD. Chest CT without contraston 07/25/2017 revealed no suspicious pulmonary nodules.  Symptomatically, she is fatigued.  She describes recurrent significant bleeding from her feet.  She has hip pain and would like to proceed with  hip replacement.    Plan: 1.   Labs today:  LDH, beta 2-microglobulin.  2.  Smoldering multiple myeloma Symptomatically, she feels fatigued. M spike was 1.6 gm/dL on 05/09/2019. Lambda free light [id number](ratio 0.07). Bone survey on 05/20/2019 revealed numerous new lytic lesions throughout the skeleton:             Posterior  calvarium, bilateral humeri, bilateral iliac bones, left inferior pubic ramus, bilateral femurs. Bone marrow aspirate on 05/29/2019 revealed a normocellular marrow for age (20-30%) with increased monoclonal plasma cells (15% aspirate, 20-30% CD138 IHC).    Cytogenetics were normal (80, XX).    FISH for myeloma revealed a deletion of NF1 or chromosome 17q11 (2R1G, 89%, not seen in validation studies).  FISH for MDS revealed gain of KMT2A (MLL) or chromosome 11/11q (26F. 5.0%, normal < 3.1%).   No dysplastic changes noted. Pathology contacted and initial bone marrow from 07/26/2017 compared.  Marrow plasmacytosis has increased from 10-15% to 20-30% by CD138 stains.  Discuss CRAB criteria.  Patient has Renal dysfunction (Cr 2.25 with CrCl 39 ml/min on 05/27/2019), Anemia (Hgb 9.1), and lytic Bone lesions.    Calcium is normal. Discuss treatment.  Patient is not a transplant candidate given her age and comorbidities.  Discussed with patient.   Discuss VRd (Velcade 1.3 mg/m2 SQ day 1,4,8,11, Revlimid po day 1-14, and Decadron 20 mg po on day 1,2,4,5,8,9,11 and 12) every 21 days for 8 cycles    VRd can be given with weekly Velcade (day 1,8,15) with Decadron.   Then maintenance Rd until progression or toxicity.   Side effects of treatment reviewed.   Discuss herpes zoster prophylaxis, antithrombotic therapy (aspirin).  Consider DaraRd (daratumumab 16 mg/kg IV, Decadron 20-40mg on days of daratumumab, and Revlimid days 1-21) every 28 days.   Discuss chemotherapy class. Discuss issues with need for hip replacement. Discuss issues with transportation.  3. Back and right hip pain PET scan on 01/21/2020revealed no active myeloma. Bone survey revealed multiple lytic lesions. Patient has known advanced right hip osteoarthritis.  Patient seen by Dr Alvan Dame for consideration of hip replacement.  Hip pain is causing significant quality of life issues (pain) as well as functional limitations  (ambulating).  Discuss with Dr Alvan Dame. 4. Renal insufficieny Creatinine 1.39 on 05/09/2019 and 2.25 on 05/27/2019.  Consideration made for Retacrit to stimulate RBCs in anticipation of surgery. Patient is followed byDr Holley Raring. 5. Macrocytic anemia, progressive Hematocrit 31.3. Hemoglobin 10.0. MCV 107.2 on 02/11/2019. Hematocrit 29.9. Hemoglobin 9.4. MCV 108.3 on 05/09/2019. Hematocrit 28.7.  Hemoglobin 9.1.  MCV 108.3 on 05/29/2019.  B12 was 911 and folate 60.9. on 05/16/2019. Shehas no liver disease. Bone marrow on04/06/2017 and 05/29/2019 revealed no evidence of MDS. 6.   Vascular issues in feet  Patient notes significant blood loss secondary to fragile blood vessels in feet.  Discuss with vascular surgery.   7.   General debilitation  Home health evaluation.  Lucianne Lei transportation to clinic. 8.   Tumor board on 06/25/2019. 9.   RTC on 06/26/2019 for MD assessment and further discussion regarding direction of therapy.  I discussed the assessment and treatment plan with the patient.  The patient was provided an opportunity to ask questions and all were answered.  The patient agreed with the plan and demonstrated an understanding of the instructions.  The patient was advised to call back if the symptoms worsen or if the condition fails to improve as anticipated.  I provided 44 minutes of face-to-face time during this this  encounter and > 50% was spent counseling as documented under my assessment and plan.  An additional 5-10 minutes were spent discussing her bone marrow with pathology.    Lequita Asal, MD, PhD    06/10/2019, 11:28 AM  I, Selena Batten, am acting as scribe for [employer]. Mike Gip, MD, PhD.  I, Annabell Oconnor C. Mike Gip, MD, have reviewed the above documentation for accuracy and completeness, and I agree with the above.

## 2019-06-07 ENCOUNTER — Encounter (HOSPITAL_COMMUNITY): Payer: Self-pay | Admitting: Hematology and Oncology

## 2019-06-07 ENCOUNTER — Encounter: Payer: Self-pay | Admitting: Hematology and Oncology

## 2019-06-07 NOTE — Progress Notes (Signed)
No new changes noted today. The patient name and DOB has been verified by phone today. 

## 2019-06-10 ENCOUNTER — Encounter: Payer: Self-pay | Admitting: Hematology and Oncology

## 2019-06-10 ENCOUNTER — Inpatient Hospital Stay: Payer: Medicare Other

## 2019-06-10 ENCOUNTER — Inpatient Hospital Stay: Payer: Medicare Other | Attending: Hematology and Oncology | Admitting: Hematology and Oncology

## 2019-06-10 ENCOUNTER — Other Ambulatory Visit: Payer: Self-pay

## 2019-06-10 VITALS — BP 151/60 | HR 62 | Temp 96.6°F | Wt 175.3 lb

## 2019-06-10 DIAGNOSIS — D539 Nutritional anemia, unspecified: Secondary | ICD-10-CM | POA: Diagnosis not present

## 2019-06-10 DIAGNOSIS — I2510 Atherosclerotic heart disease of native coronary artery without angina pectoris: Secondary | ICD-10-CM | POA: Diagnosis not present

## 2019-06-10 DIAGNOSIS — E1151 Type 2 diabetes mellitus with diabetic peripheral angiopathy without gangrene: Secondary | ICD-10-CM | POA: Insufficient documentation

## 2019-06-10 DIAGNOSIS — I8390 Asymptomatic varicose veins of unspecified lower extremity: Secondary | ICD-10-CM | POA: Diagnosis not present

## 2019-06-10 DIAGNOSIS — I509 Heart failure, unspecified: Secondary | ICD-10-CM | POA: Insufficient documentation

## 2019-06-10 DIAGNOSIS — M549 Dorsalgia, unspecified: Secondary | ICD-10-CM | POA: Diagnosis not present

## 2019-06-10 DIAGNOSIS — Z79899 Other long term (current) drug therapy: Secondary | ICD-10-CM | POA: Diagnosis not present

## 2019-06-10 DIAGNOSIS — Z7189 Other specified counseling: Secondary | ICD-10-CM

## 2019-06-10 DIAGNOSIS — J449 Chronic obstructive pulmonary disease, unspecified: Secondary | ICD-10-CM | POA: Diagnosis not present

## 2019-06-10 DIAGNOSIS — E039 Hypothyroidism, unspecified: Secondary | ICD-10-CM | POA: Diagnosis not present

## 2019-06-10 DIAGNOSIS — R5381 Other malaise: Secondary | ICD-10-CM | POA: Insufficient documentation

## 2019-06-10 DIAGNOSIS — Z8673 Personal history of transient ischemic attack (TIA), and cerebral infarction without residual deficits: Secondary | ICD-10-CM | POA: Insufficient documentation

## 2019-06-10 DIAGNOSIS — I130 Hypertensive heart and chronic kidney disease with heart failure and stage 1 through stage 4 chronic kidney disease, or unspecified chronic kidney disease: Secondary | ICD-10-CM | POA: Insufficient documentation

## 2019-06-10 DIAGNOSIS — M1990 Unspecified osteoarthritis, unspecified site: Secondary | ICD-10-CM | POA: Insufficient documentation

## 2019-06-10 DIAGNOSIS — N183 Chronic kidney disease, stage 3 unspecified: Secondary | ICD-10-CM | POA: Diagnosis not present

## 2019-06-10 DIAGNOSIS — R5383 Other fatigue: Secondary | ICD-10-CM | POA: Diagnosis not present

## 2019-06-10 DIAGNOSIS — C9000 Multiple myeloma not having achieved remission: Secondary | ICD-10-CM | POA: Insufficient documentation

## 2019-06-10 DIAGNOSIS — I252 Old myocardial infarction: Secondary | ICD-10-CM | POA: Insufficient documentation

## 2019-06-10 DIAGNOSIS — E785 Hyperlipidemia, unspecified: Secondary | ICD-10-CM | POA: Diagnosis not present

## 2019-06-10 DIAGNOSIS — Z7982 Long term (current) use of aspirin: Secondary | ICD-10-CM | POA: Insufficient documentation

## 2019-06-10 DIAGNOSIS — E1122 Type 2 diabetes mellitus with diabetic chronic kidney disease: Secondary | ICD-10-CM | POA: Diagnosis not present

## 2019-06-10 DIAGNOSIS — N1832 Chronic kidney disease, stage 3b: Secondary | ICD-10-CM | POA: Diagnosis not present

## 2019-06-10 DIAGNOSIS — R197 Diarrhea, unspecified: Secondary | ICD-10-CM | POA: Diagnosis not present

## 2019-06-10 LAB — LACTATE DEHYDROGENASE: LDH: 185 U/L (ref 98–192)

## 2019-06-11 ENCOUNTER — Inpatient Hospital Stay: Admit: 2019-06-11 | Payer: Medicare Other | Admitting: Orthopedic Surgery

## 2019-06-11 LAB — BETA 2 MICROGLOBULIN, SERUM: Beta-2 Microglobulin: 5.2 mg/L — ABNORMAL HIGH (ref 0.6–2.4)

## 2019-06-11 SURGERY — ARTHROPLASTY, HIP, TOTAL, ANTERIOR APPROACH
Anesthesia: Spinal | Site: Hip | Laterality: Right

## 2019-06-14 ENCOUNTER — Other Ambulatory Visit: Payer: Self-pay | Admitting: Hematology and Oncology

## 2019-06-14 DIAGNOSIS — I83009 Varicose veins of unspecified lower extremity with ulcer of unspecified site: Secondary | ICD-10-CM

## 2019-06-14 DIAGNOSIS — L97909 Non-pressure chronic ulcer of unspecified part of unspecified lower leg with unspecified severity: Secondary | ICD-10-CM

## 2019-06-20 ENCOUNTER — Ambulatory Visit (INDEPENDENT_AMBULATORY_CARE_PROVIDER_SITE_OTHER): Payer: Medicare Other | Admitting: Podiatry

## 2019-06-20 ENCOUNTER — Encounter: Payer: Self-pay | Admitting: Podiatry

## 2019-06-20 ENCOUNTER — Other Ambulatory Visit: Payer: Self-pay

## 2019-06-20 ENCOUNTER — Telehealth: Payer: Self-pay

## 2019-06-20 DIAGNOSIS — M79674 Pain in right toe(s): Secondary | ICD-10-CM

## 2019-06-20 DIAGNOSIS — M79675 Pain in left toe(s): Secondary | ICD-10-CM | POA: Diagnosis not present

## 2019-06-20 DIAGNOSIS — B351 Tinea unguium: Secondary | ICD-10-CM | POA: Diagnosis not present

## 2019-06-20 NOTE — Progress Notes (Signed)
Complaint:  Visit Type: Patient returns to my office for continued preventative foot care services. Complaint: Patient states" my nails have grown long and thick and become painful to walk and wear shoes" Patient has been diagnosed with DM with neuropathy. The patient presents for preventative foot care services. No changes to ROS  Podiatric Exam: Vascular: dorsalis pedis and posterior tibial pulses are palpable bilateral. Capillary return is immediate. Temperature gradient is WNL. Skin turgor WNL  Sensorium: Diminished  Semmes Weinstein monofilament test. Normal tactile sensation bilaterally. Nail Exam: Pt has thick disfigured discolored nails with subungual debris noted bilateral entire nail hallux through fifth toenails Ulcer Exam: There is no evidence of ulcer or pre-ulcerative changes or infection. Orthopedic Exam: Muscle tone and strength are WNL. No limitations in general ROM. No crepitus or effusions noted. Foot type and digits show no abnormalities.HAV with overlapping 2 nd  B/L.  PTTD right foot.  DJD rearfoot  Right foot. Skin: No Porokeratosis. No infection or ulcers.  Pinch callus  B/L asymptomatic.  Diagnosis:  Onychomycosis, , Pain in right toe, pain in left toes    Treatment & Plan Procedures and Treatment: Consent by patient was obtained for treatment procedures.   Debridement of mycotic and hypertrophic toenails, 1 through 5 bilateral and clearing of subungual debris. No ulceration, no infection noted.  Return Visit-Office Procedure: Patient instructed to return to the office for a follow up visit 3 months for continued evaluation and treatment.  Visual inspection of the foot was performed.    Gardiner Barefoot DPM

## 2019-06-20 NOTE — Telephone Encounter (Signed)
New patient information has been faxed to [facility]. fax conformation has been received.

## 2019-06-21 ENCOUNTER — Encounter (INDEPENDENT_AMBULATORY_CARE_PROVIDER_SITE_OTHER): Payer: Self-pay | Admitting: Vascular Surgery

## 2019-06-21 ENCOUNTER — Ambulatory Visit (INDEPENDENT_AMBULATORY_CARE_PROVIDER_SITE_OTHER): Payer: Medicare Other | Admitting: Vascular Surgery

## 2019-06-21 VITALS — BP 129/71 | HR 73 | Resp 16 | Wt 175.9 lb

## 2019-06-21 DIAGNOSIS — C9000 Multiple myeloma not having achieved remission: Secondary | ICD-10-CM | POA: Diagnosis not present

## 2019-06-21 DIAGNOSIS — I83019 Varicose veins of right lower extremity with ulcer of unspecified site: Secondary | ICD-10-CM

## 2019-06-21 DIAGNOSIS — I10 Essential (primary) hypertension: Secondary | ICD-10-CM | POA: Diagnosis not present

## 2019-06-21 DIAGNOSIS — L97919 Non-pressure chronic ulcer of unspecified part of right lower leg with unspecified severity: Secondary | ICD-10-CM

## 2019-06-21 DIAGNOSIS — E785 Hyperlipidemia, unspecified: Secondary | ICD-10-CM | POA: Diagnosis not present

## 2019-06-21 DIAGNOSIS — I2510 Atherosclerotic heart disease of native coronary artery without angina pectoris: Secondary | ICD-10-CM

## 2019-06-21 DIAGNOSIS — I25118 Atherosclerotic heart disease of native coronary artery with other forms of angina pectoris: Secondary | ICD-10-CM

## 2019-06-21 NOTE — Assessment & Plan Note (Signed)
lipid control important in reducing the progression of atherosclerotic disease. Continue statin therapy  

## 2019-06-21 NOTE — Assessment & Plan Note (Signed)
blood pressure control important in reducing the progression of atherosclerotic disease. On appropriate oral medications.  

## 2019-06-21 NOTE — Progress Notes (Signed)
Patient ID: Bailey Ayala, female   DOB: 1940/12/10, 79 y.o.   MRN: [id number]  Chief Complaint  Patient presents with  . New Patient (Initial Visit)    ref [place] myeloma and 2 episodes of significant hemorrhage    HPI Bailey Ayala is a 79 y.o. female.  I am asked to see the patient by Dr. Mike Gip for evaluation of bleeding varicosities on the right medial ankle.  The patient has had 2 episodes which have prompted rescue squad calls or hospital visits for bleeding from varicose veins on the right leg.  The most recent one has a scab remaining where it was a couple of weeks ago.  This is just below the right medial ankle.  It involves significant underlying varicosities in that area.  The patient reports that she has previously had some sort of venous procedure done by Dr. Jamal Collin before he retired on the left leg but does not remember 1 on the right leg.  She is also followed and known to have what she describes as 100% blockage in her right SFA and a 75% blockage in her left SFA.  She does not have a lot of swelling.  Her legs hurt, but she felt this was more due to arthritis issues than a vascular problem.  There is no clear inciting event or causative factor that started her symptoms.   Past Medical History:  Diagnosis Date  . Allergy 1980  . Anemia 09-2017  . Arthritis   . Blood dyscrasia    BLEEDS EASILY   . Blood transfusion without reported diagnosis 2011 or 12  . Cataract ?  . CHF (congestive heart failure) ([place]) ?  Marland Kitchen Clotting disorder ([place]) ?  Marland Kitchen COPD (chronic obstructive pulmonary disease) ([place])   . Diabetes mellitus without complication ([place])   . Diffuse cystic mastopathy   . Emphysema of lung ([place]) ?  Marland Kitchen Hyperlipidemia   . Hypertension   . Hypothyroidism   . L4-L5 disc bulge   . Multiple myeloma ([place])   . Myocardial infarction ([place]) 2019  . Osteoporosis, post-menopausal   . PAD (peripheral artery disease) ([place])   . Renal insufficiency   . Shortness of  breath dyspnea    WITH EXERTION   . Stroke ([place])    TIA     2016     WAS FOUND ON SCAN ORDERED FROM NEUROL  . Thyroid disease   . TIA (transient ischemic attack) 2016   left hand weakness    Past Surgical History:  Procedure Laterality Date  . ABDOMINAL HYSTERECTOMY  1990  . BACK SURGERY  2011  . BREAST EXCISIONAL BIOPSY Left    2010?   Marland Kitchen BREAST SURGERY  1975   A lump removed left breast  . CATARACT EXTRACTION, BILATERAL    . COLONOSCOPY  1998  . EYE SURGERY Bilateral 05/06/14  . JOINT REPLACEMENT     LT KNEE  . KNEE SURGERY Left 2012  . LEFT HEART CATH AND CORONARY ANGIOGRAPHY Left 11/19/2018   Procedure: LEFT HEART CATH AND CORONARY ANGIOGRAPHY;  Surgeon: Wellington Hampshire, MD;  Location: [facility];  Service: Cardiovascular;  Laterality: Left;  . OOPHORECTOMY  1990  . parathyroid transplant     2/3 THYROID REMOVED   (GOITER)  . SPINE SURGERY    . THYROID SURGERY    . TONSILLECTOMY    . TOTAL SHOULDER ARTHROPLASTY Left 01/14/2016   Procedure: LEFT TOTAL SHOULDER ARTHROPLASTY;  Surgeon: Justice Britain, MD;  Location: [facility];  Service: Orthopedics;  Laterality: Left;  . TOTAL SHOULDER ARTHROPLASTY Right 11/03/2016   Procedure: RIGHT TOTAL SHOULDER ARTHROPLASTY;  Surgeon: Justice Britain, MD;  Location: [facility];  Service: Orthopedics;  Laterality: Right;  . ULNAR NERVE REPAIR     LEFT  . vein closure procedure Left 2010    Family History  Problem Relation Age of Onset  . Heart disease Mother        MI  . Arthritis Mother   . Stroke Mother   . Hyperlipidemia Mother   . Hypertension Mother   . Varicose Veins Mother   . Heart disease Father        MI  . Hyperlipidemia Father   . Arthritis Father   . Cancer Father   . COPD Father   . Hearing loss Father   . Hypertension Father   . Vision loss Father   . Diabetes Sister   . COPD Sister   . Heart disease Sister   . Hyperlipidemia Sister   . Hypertension Sister   . Hyperlipidemia Brother   . COPD Brother   .  Diabetes Brother   . Heart disease Brother   . Hypertension Brother   . Arthritis Sister   . Hyperlipidemia Sister   . Hypertension Sister   . Vision loss Sister   . Varicose Veins Sister   . Arthritis Brother   . Varicose Veins Brother   . Arthritis Daughter   . Arthritis Daughter   . Diabetes Daughter   . Cancer Paternal Aunt   . COPD Sister   . Heart disease Sister   . Hyperlipidemia Sister   . Hypertension Sister   . Varicose Veins Sister   . Heart disease Maternal Uncle   . Heart disease Paternal Uncle     Social History   Tobacco Use  . Smoking status: Former Smoker    Packs/day: 3.00    Years: 25.00    Pack years: 75.00    Types: Cigarettes    Quit date: 11/03/1989    Years since quitting: 29.6  . Smokeless tobacco: Never Used  Substance Use Topics  . Alcohol use: Not Currently    Alcohol/week: 0.0 standard drinks  . Drug use: No     Allergies  Allergen Reactions  . Codeine Other (See Comments)    hallucinations    . Simvastatin Diarrhea and Nausea Only    Current Outpatient Medications  Medication Sig Dispense Refill  . acetaminophen (TYLENOL) 500 MG tablet Take 500 mg by mouth every 6 (six) hours as needed.    Marland Kitchen aspirin EC 81 MG tablet Take 1 tablet (81 mg total) by mouth daily. 90 tablet 3  . benazepril (LOTENSIN) 20 MG tablet Take 1 tablet (20 mg total) by mouth daily. 90 tablet 4  . ferrous sulfate 325 (65 FE) MG tablet Take 325 mg by mouth daily with breakfast.    . levothyroxine (SYNTHROID, LEVOTHROID) 100 MCG tablet Take 1 tablet (100 mcg total) by mouth daily. 90 tablet 4  . metoprolol tartrate (LOPRESSOR) 50 MG tablet Take 1 tablet (50 mg total) by mouth 2 (two) times daily. 60 tablet 11  . Multiple Vitamin (MULTIVITAMIN WITH MINERALS) TABS tablet Take 1 tablet by mouth daily.    . traMADol (ULTRAM) 50 MG tablet tramadol 50 mg tablet  TAKE 1 TABLET BY MOUTH EVERY NIGHT AS NEEDED FOR PAIN    . triamcinolone cream (KENALOG) 0.1 % Apply 1  application topically 2 (two) times  daily. 30 g 0  . atorvastatin (LIPITOR) 40 MG tablet Take 1 tablet (40 mg total) by mouth daily. 90 tablet 3   No current facility-administered medications for this visit.      REVIEW OF SYSTEMS (Negative unless checked)  Constitutional: '[]' Weight loss  '[]' Fever  '[]' Chills Cardiac: '[]' Chest pain   '[]' Chest pressure   '[]' Palpitations   '[]' Shortness of breath when laying flat   '[]' Shortness of breath at rest   '[]' Shortness of breath with exertion. Vascular:  '[]' Pain in legs with walking   '[]' Pain in legs at rest   '[]' Pain in legs when laying flat   '[]' Claudication   '[]' Pain in feet when walking  '[]' Pain in feet at rest  '[]' Pain in feet when laying flat   '[]' History of DVT   '[]' Phlebitis   '[]' Swelling in legs   '[x]' Varicose veins   '[x]' Non-healing ulcers Pulmonary:   '[]' Uses home oxygen   '[]' Productive cough   '[]' Hemoptysis   '[]' Wheeze  '[]' COPD   '[]' Asthma Neurologic:  '[]' Dizziness  '[]' Blackouts   '[]' Seizures   '[]' History of stroke   '[]' History of TIA  '[]' Aphasia   '[]' Temporary blindness   '[]' Dysphagia   '[]' Weakness or numbness in arms   '[]' Weakness or numbness in legs Musculoskeletal:  '[x]' Arthritis   '[]' Joint swelling   '[x]' Joint pain   '[]' Low back pain Hematologic:  '[x]' Easy bruising  '[]' Easy bleeding   '[]' Hypercoagulable state   '[x]' Anemic  '[]' Hepatitis Gastrointestinal:  '[]' Blood in stool   '[]' Vomiting blood  '[]' Gastroesophageal reflux/heartburn   '[]' Abdominal pain Genitourinary:  '[x]' Chronic kidney disease   '[]' Difficult urination  '[]' Frequent urination  '[]' Burning with urination   '[]' Hematuria Skin:  '[]' Rashes   '[]' Ulcers   '[]' Wounds Psychological:  '[]' History of anxiety   '[]'  History of major depression.    Physical Exam BP 129/71 (BP Location: Right Arm)   Pulse 73   Resp 16   Wt 175 lb 14.2 oz (79.8 kg)   BMI 29.27 kg/m  Gen:  WD/WN, NAD Head: [place]/AT, No temporalis wasting.  Ear/Nose/Throat: Hearing grossly intact, nares w/o erythema or drainage, oropharynx w/o Erythema/Exudate Eyes: Conjunctiva  clear, sclera non-icteric  Neck: trachea midline.  No JVD.  Pulmonary:  Good air movement, respirations not labored, no use of accessory muscles  Cardiac: RRR, no JVD Vascular:  Vessel Right Left  Radial Palpable Palpable                          DP  1+  1+  PT  trace  1+   Gastrointestinal:. No masses, surgical incisions, or scars. Musculoskeletal: M/S 5/5 throughout.  Walks with a walker.  No deformity or atrophy.  Prominent varicosities around the foot and ankle little more so on the right than the left.  The area that has bled is a 2 to 3 mm varicosity on the medial right ankle.  There is really no significant lower extremity edema. Neurologic: Sensation grossly intact in extremities.  Symmetrical.  Speech is fluent. Motor exam as listed above. Psychiatric: Judgment intact, Mood & affect appropriate for pt's clinical situation. Dermatologic: No rashes or ulcers noted.  No cellulitis or open wounds.    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 EXAM: CT GUIDED BONE MARROW ASPIRATES AND BIOPSY Physician: Stephan Minister. Anselm Pancoast, MD MEDICATIONS: None. ANESTHESIA/SEDATION: Fentanyl 37.5 mcg IV; Versed 1.5 mg IV Moderate Sedation Time:  18 minutes The patient was continuously monitored during the procedure by the interventional  radiology nurse under my direct supervision. COMPLICATIONS: None immediate. PROCEDURE: The procedure was explained to the patient. The risks and benefits of the procedure were discussed and the patient's questions were addressed. Informed consent was obtained from the patient. The patient was placed on her left side. Images of the pelvis were obtained. The back was prepped and draped in sterile fashion. The skin and left posterior ilium were anesthetized with 1% lidocaine. 11 gauge bone needle was directed into the left ilium with CT guidance. Two aspirates and two core biopsies were obtained.  Bandage placed over the puncture site. IMPRESSION: CT guided bone marrow aspiration and core biopsy. Electronically Signed   By: Markus Daft M.D.   On: 05/29/2019 12:09   VAS [place] AAA DUPLEX  Result Date: 05/31/2019 ABDOMINAL AORTA STUDY Indications: Follow up exam for known AAA. Infrarenal AAA seen on 05/15/18 PET              scan, measured 3.6cm Risk Factors: Hypertension, hyperlipidemia, coronary artery disease. Other Factors: No unexplained abdominal or back pain.  Comparison Study: No previous aorta duplex exam on record. Performing Technologist: Pilar Jarvis RDMS, RVT, RDCS  Examination Guidelines: A complete evaluation includes B-mode imaging, spectral Doppler, color Doppler, and power Doppler as needed of all accessible portions of each vessel. Bilateral testing is considered an integral part of a complete examination. Limited examinations for reoccurring indications may be performed as noted.  Abdominal Aorta Findings: +-------------+-------+----------+----------+----------+--------+--------+ Location     AP (cm)Trans (cm)PSV (cm/s)Waveform  ThrombusComments +-------------+-------+----------+----------+----------+--------+--------+ Proximal     2.10   2.00      64        biphasic                   +-------------+-------+----------+----------+----------+--------+--------+ Mid          2.10   2.10      60        biphasic                   +-------------+-------+----------+----------+----------+--------+--------+ Distal       3.80   4.00      43        biphasic  Present saccular +-------------+-------+----------+----------+----------+--------+--------+ RT CIA Prox  1.3    1.4       56        monophasic                 +-------------+-------+----------+----------+----------+--------+--------+ RT CIA Mid                    136       biphasic                   +-------------+-------+----------+----------+----------+--------+--------+ RT CIA Distal                  174       biphasic                   +-------------+-------+----------+----------+----------+--------+--------+ RT EIA Prox                   153                                  +-------------+-------+----------+----------+----------+--------+--------+ RT EIA Mid   1.0    1.1       156       biphasic                   +-------------+-------+----------+----------+----------+--------+--------+  RT EIA Distal                 139                                  +-------------+-------+----------+----------+----------+--------+--------+ LT CIA Prox  1.0    1.2       74        biphasic                   +-------------+-------+----------+----------+----------+--------+--------+ LT CIA Mid                    112                                  +-------------+-------+----------+----------+----------+--------+--------+ LT CIA Distal                 72                                   +-------------+-------+----------+----------+----------+--------+--------+ LT EIA Prox                   284       biphasic                   +-------------+-------+----------+----------+----------+--------+--------+ LT EIA Mid   0.9    1.0       282       monophasic                 +-------------+-------+----------+----------+----------+--------+--------+ LT EIA Distal                 247       biphasic                   +-------------+-------+----------+----------+----------+--------+--------+ The left external iliac artery appears to contain calcified plaque throughout its length and color Dopper demonstrated turbulent flow throughout. IVC/Iliac Findings: +-------+------+--------+--------+   IVC  PatentThrombusComments +-------+------+--------+--------+ IVC Midpatent                 +-------+------+--------+--------+    Summary: Abdominal Aorta: There is evidence of abnormal dilatation of the distal Abdominal aorta. The largest aortic measurement is 4.0  cm. The largest aortic diameter has increased compared to prior exam. Previous diameter measurement was 3.6 cm obtained on 05/15/18 by PET scan. Stenosis: +-------------------+-------------+-------------+ Location           Stenosis     Comments      +-------------------+-------------+-------------+ Left External Iliac>50% stenosisEntire length +-------------------+-------------+-------------+   *See table(s) above for measurements and observations. Suggest follow up study in 12 months.  Electronically signed by Larae Grooms MD on 05/31/2019 at 5:55:28 PM.    Final     Labs Recent Results (from the past 2160 hour(s))  Novel Coronavirus, NAA ([facility])     Status: Abnormal   Collection Time: 03/27/19 12:00 AM   Specimen: Nasopharyngeal(NP) swabs in vial transport medium   NASOPHARYNGE  TESTING  Result Value Ref Range   SARS-CoV-2, NAA Detected (A) Not Detected    Comment: This nucleic acid amplification test was developed and its performance characteristics determined by [employer]. Nucleic acid amplification tests include PCR and TMA. This test has not been FDA cleared or approved. This test has been authorized  by FDA under an Emergency Use Authorization (EUA). This test is only authorized for the duration of time the declaration that circumstances exist justifying the authorization of the emergency use of in vitro diagnostic tests for detection of SARS-CoV-2 virus and/or diagnosis of COVID-19 infection under section 564(b)(1) of the Act, 21 U.S.C. 026VZC-5(Y) (1), unless the authorization is terminated or revoked sooner. When diagnostic testing is negative, the possibility of a false negative result should be considered in the context of a patient's recent exposures and the presence of clinical signs and symptoms consistent with COVID-19. An individual without symptoms of COVID-19 and who is not shedding SARS-CoV-2 virus would  expect to have a negative (not detected)  result in this assay.   Kappa/lambda light chains     Status: Abnormal   Collection Time: 05/09/19  2:45 PM  Result Value Ref Range   Kappa free light chain 17.8 3.3 - 19.4 mg/L   Lamda free light chains 258.4 (H) 5.7 - 26.3 mg/L   Kappa, lamda light chain ratio 0.07 (L) 0.26 - 1.65    Comment: (NOTE) Performed At: [facility] [place], [place] [id number] Rush Farmer MD IN:[id number]   Protein electrophoresis, serum     Status: Abnormal   Collection Time: 05/09/19  2:45 PM  Result Value Ref Range   Total Protein ELP 7.9 6.0 - 8.5 g/dL   Albumin ELP 3.9 2.9 - 4.4 g/dL   Alpha-1-Globulin 0.2 0.0 - 0.4 g/dL   Alpha-2-Globulin 1.0 0.4 - 1.0 g/dL   Beta Globulin 0.9 0.7 - 1.3 g/dL   Gamma Globulin 1.8 0.4 - 1.8 g/dL   M-Spike, % 1.6 (H) Not Observed g/dL   SPE Interp. Comment     Comment: (NOTE) The SPE pattern demonstrates a single peak (M-spike) in the gamma region which may represent monoclonal protein. This peak may also be caused by circulating immune complexes, cryoglobulins, C-reactive protein, fibrinogen or hemolysis.  If clinically indicated, the presence of a monoclonal gammopathy may be confirmed by immuno- fixation, as well as an evaluation of the urine for the presence of Bence-Jones protein. Performed At: [facility] [place], [place] [id number] Rush Farmer MD HU:[id number]    Comment Comment     Comment: (NOTE) Protein electrophoresis scan will follow via computer, mail, or courier delivery.    Globulin, Total 4.0 (H) 2.2 - 3.9 g/dL   A/G Ratio 1.0 0.7 - 1.7  Comprehensive metabolic panel     Status: Abnormal   Collection Time: 05/09/19  2:45 PM  Result Value Ref Range   Sodium 134 (L) 135 - 145 mmol/L   Potassium 3.7 3.5 - 5.1 mmol/L   Chloride 103 98 - 111 mmol/L   CO2 25 22 - 32 mmol/L   Glucose, Bld 106 (H) 70 - 99 mg/dL   BUN 20 8 - 23 mg/dL   Creatinine, Ser 1.39 (H) 0.44 - 1.00 mg/dL   Calcium  9.1 8.9 - 10.3 mg/dL   Total Protein 8.4 (H) 6.5 - 8.1 g/dL   Albumin 4.0 3.5 - 5.0 g/dL   AST 20 15 - 41 U/L   ALT 13 0 - 44 U/L   Alkaline Phosphatase 72 38 - 126 U/L   Total Bilirubin 0.3 0.3 - 1.2 mg/dL   GFR calc non Af Amer 36 (L) >60 mL/min   GFR calc Af Amer 42 (L) >60 mL/min   Anion gap 6 5 - 15    Comment: Performed at [facility], [address] [place], [place] [address]  CBC with Differential/Platelet     Status: Abnormal   Collection Time: 05/09/19  2:45 PM  Result Value Ref Range   WBC 4.7 4.0 - 10.5 K/uL   RBC 2.76 (L) 3.87 - 5.11 MIL/uL   Hemoglobin 9.4 (L) 12.0 - 15.0 g/dL   HCT 29.9 (L) 36.0 - 46.0 %   MCV 108.3 (H) 80.0 - 100.0 fL   MCH 34.1 (H) 26.0 - 34.0 pg   MCHC 31.4 30.0 - 36.0 g/dL   RDW 14.6 11.5 - 15.5 %   Platelets 270 150 - 400 K/uL   nRBC 0.0 0.0 - 0.2 %   Neutrophils Relative % 54 %   Neutro Abs 2.5 1.7 - 7.7 K/uL   Lymphocytes Relative 29 %   Lymphs Abs 1.4 0.7 - 4.0 K/uL   Monocytes Relative 10 %   Monocytes Absolute 0.5 0.1 - 1.0 K/uL   Eosinophils Relative 6 %   Eosinophils Absolute 0.3 0.0 - 0.5 K/uL   Basophils Relative 1 %   Basophils Absolute 0.0 0.0 - 0.1 K/uL   Immature Granulocytes 0 %   Abs Immature Granulocytes 0.01 0.00 - 0.07 K/uL    Comment: Performed at [facility], [address], [place], [place] [address]  Ferritin     Status: None   Collection Time: 05/16/19  2:40 PM  Result Value Ref Range   Ferritin 128 11 - 307 ng/mL    Comment: Performed at [facility], [address] [place], [place] [address]  Iron and TIBC     Status: Abnormal   Collection Time: 05/16/19  2:40 PM  Result Value Ref Range   Iron 89 28 - 170 ug/dL   TIBC 281 250 - 450 ug/dL   Saturation Ratios 32 (H) 10.4 - 31.8 %   UIBC 192 ug/dL    Comment: Performed at [facility], [address] [place], [place] [address]  Folate     Status: None   Collection Time: 05/16/19  2:40 PM  Result Value Ref  Range   Folate 60.9 >5.9 ng/mL    Comment: RESULTS CONFIRMED BY MANUAL DILUTION Performed at [facility], [address] [place], [place] [address]   Reticulocytes     Status: Abnormal   Collection Time: 05/16/19  2:40 PM  Result Value Ref Range   Retic Ct Pct 2.7 0.4 - 3.1 %   RBC. 2.82 (L) 3.87 - 5.11 MIL/uL   Retic Count, Absolute 74.7 19.0 - 186.0 K/uL   Immature Retic Fract 29.1 (H) 2.3 - 15.9 %    Comment: Performed at [facility], [address] [place], [place] [address]  Vitamin B12     Status: None   Collection Time: 05/16/19  2:40 PM  Result Value Ref Range   Vitamin B-12 911 180 - 914 pg/mL    Comment: (NOTE) This assay is not validated for testing neonatal or myeloproliferative syndrome specimens for Vitamin B12 levels. Performed at [facility], [place] [address] [place], [place] [address]   Surgical pathology     Status: None   Collection Time: 05/29/19 12:00 AM  Result Value Ref Range   SURGICAL PATHOLOGY      Surgical Pathology CASE: [id number] PATIENT: Corinna Gab Flow Pathology Report     Clinical history: multiple myeloma     DIAGNOSIS:  - No monoclonal B-cell or phenotypically aberrant T-cell population identified.  GATING AND PHENOTYPIC ANALYSIS:  Gated population: Flow cytometric immunophenotyping is performed using antibodies to the antigens listed in the table below. Electronic gates are placed around a cell cluster displaying light scatter properties corresponding to: lymphocytes  Abnormal Cells in gated population: N/A  Phenotype of Abnormal Cells: N/A                       Lymphoid Antigens       Myeloid Antigens Miscellaneous CD2  tested    CD10 tested    CD11b     ND   CD45 tested CD3  tested    CD19 tested    CD11c     ND   HLA-Dr    ND CD4  tested    CD20 tested    CD13 ND   CD34 tested CD5  tested    CD22 ND   CD14 ND   CD38 tested CD7  tested    CD79b     ND   CD15 ND   CD138      ND CD8  tested    CD103     ND   CD16 ND   TdT   ND CD25 ND   CD200     tested    CD33 ND   CD123     ND TCRab     ND   sKappa    tested    CD64 ND   CD41 ND TCRgd     tested    sLambda   tested    CD117     ND   CD61 ND CD56 tested    cKappa    ND   MPO  ND   CD71 ND CD57 ND   cLambda   ND        [id number]      GROSS DESCRIPTION:  Reference Bone Marrow [id number].    Final Diagnosis performed by Vicente Males, MD.   Electronically signed 05/31/2019 Technical and / or Professional components performed at [facility], [place] [address], [place], [place] [address].  The above tests were developed and their performance characteristics determined by the [facility] system for the physical and immunophenotypic characterization of cell populations. They have not been cleared by the U.S. Food and Drug administration. The  FDA has determined that such clearance or approval is not necessary. This test is used for clinical purposes. It should not be  regarded as investigational or for research   CBC with Differential/Platelet     Status: Abnormal   Collection Time: 05/29/19  7:54 AM  Result Value Ref Range   WBC 4.6 4.0 - 10.5 K/uL   RBC 2.65 (L) 3.87 - 5.11 MIL/uL   Hemoglobin 9.1 (L) 12.0 - 15.0 g/dL   HCT 28.7 (L) 36.0 - 46.0 %   MCV 108.3 (H) 80.0 - 100.0 fL   MCH 34.3 (H) 26.0 - 34.0 pg   MCHC 31.7 30.0 - 36.0 g/dL   RDW 13.8 11.5 - 15.5 %   Platelets 219 150 - 400 K/uL   nRBC 0.0 0.0 - 0.2 %   Neutrophils Relative % 64 %   Neutro Abs 3.0 1.7 - 7.7 K/uL   Lymphocytes Relative 22 %   Lymphs Abs 1.0 0.7 - 4.0 K/uL   Monocytes Relative 8 %   Monocytes Absolute 0.4 0.1 - 1.0 K/uL   Eosinophils Relative 5 %   Eosinophils Absolute 0.2 0.0 - 0.5 K/uL  Basophils Relative 1 %   Basophils Absolute 0.0 0.0 - 0.1 K/uL   Immature Granulocytes 0 %   Abs Immature Granulocytes 0.02 0.00 - 0.07 K/uL    Comment: Performed at [facility], [address]  [place], [place] [address]  Surgical pathology     Status: None   Collection Time: 05/29/19  9:46 AM  Result Value Ref Range   SURGICAL PATHOLOGY      SURGICAL PATHOLOGY CASE: [id number] PATIENT: Corinna Gab Bone Marrow Report     Clinical History: Smoldering multiple myeloma, left ilium, (ADC)     DIAGNOSIS:  BONE MARROW, ASPIRATE, CLOT, CORE: - Plasma cell myeloma, see comment.  PERIPHERAL BLOOD: - Macrocytic anemia with rouleaux formation.  COMMENT:  The marrow is normocellular but exhibits increased monoclonal plasma cells (15% aspirate, 20-30% CD138 immunohistochemistry). These findings are consistent with persistent plasma cell myeloma.  MICROSCOPIC DESCRIPTION:  PERIPHERAL BLOOD SMEAR: There is a macrocytic anemia with polychromasia and mild rouleaux formation.  Leukocytes are present in normal numbers. Circulating plasma cells are not identified.  Platelets are present in normal numbers.  BONE MARROW ASPIRATE: Spicular and cellular. Erythroid precursors: Present in normal proportions.  No significant dysplasia. Granulocytic precursors: Present normal proportions.  No significant dy splasia.  No increase in blasts. Megakaryocytes: Present without significant dysplasia. Marland Kitchen Lymphocytes/plasma cells: Plasma cells are increased (14% by manual differential counts) with atypical forms (large forms, prominent nucleoli, binucleation).  Lymphocytes are not significantly increased.  TOUCH PREPARATIONS: Similar to aspirate smears.  CLOT AND BIOPSY: The core biopsy and clot section are normocellular for age (71 to 30%).  CD138 immunohistochemistry reveals increased plasma cells 20 to 30% which are scattered and in small clusters.  By light chain in situ hybridization plasma cells are lambda restricted. Background hematopoiesis is present. There are a few scattered small hyperchromatic megakaryocytes.  Therapy benign-appearing lymphoid aggregate.  Aggregates consist  of a mixture of B cells (CD20, PAX 5) and T cells (CD3, CD5).  Cyclin D1 is negative.  IRON STAIN: Iron stains are performed on a bone marrow aspirate or touch imprint smear and section of clot. Th e controls stained appropriately.       Storage Iron: Present      Ring Sideroblasts: Absent  ADDITIONAL DATA/TESTING: Cytogenetics, including FISH for myeloma and MDS, was ordered and will be reported separately.  Flow cytometry ([id number]) is negative for a monoclonal B-cell or phenotypically aberrant T-cell population.   CELL COUNT DATA:  Bone Marrow count performed on 500 cells shows: Blasts:   0%   Myeloid:  39% Promyelocytes: 0%   Erythroid:     25% Myelocytes:    5%   Lymphocytes:   15% Metamyelocytes:     0%   Plasma cells:  15% Bands:    14% Neutrophils:   15%  M:E ratio:     1.56 Eosinophils:   5% Basophils:     0% Monocytes:     6%  Lab Data: CBC performed on 05/29/2019 shows: WBC: 4.6 k/uL  Neutrophils:   69% Hgb: 9.1 g/dL  Lymphocytes:   21% HCT: 28.7 %    Monocytes:     6% MCV: 108.3 fL  Eosinophils:   4% RDW: 13.8 %    Basophils:     0% PLT: 219 k/uL  GROSS DESCRIPTION:  A.  Bone marrow aspirate smear  B.  Received in B-plus fixative is  a 1.5 x 1.3 x 0.2 cm aggregate of dark brown soft tissue,  submitted in block B1.  C.  Received in B-plus fixative are 2 cores of dark brown hard soft tissue, 1 and 1.3 cm, submitted in 1 cassette following decalcification. (AK 05/29/2019)   Final Diagnosis performed by Vicente Males, MD.   Electronically signed 05/31/2019 Technical and / or Professional components performed at [facility], [place] [address], [place], [place] [address].  Immunohistochemistry Technical component (if applicable) was performed at [facility]. [address], [place], [place], [place] [address].   IMMUNOHISTOCHEMISTRY DISCLAIMER (if applicable): Some of these immunohistochemical stains may have been developed and  the performance characteristics determine by [facility]. Some may not have been cleared or approved by the U.S. Food and Drug Administration. The FDA has determined that such clearance or approval is not necessary. This test is used fo r clinical purposes. It should not be regarded as investigational or for research. This laboratory is certified under the [place] (CLIA-88) as qualified to perform high complexity clinical laboratory testing.  The controls stained appropriately.   Beta 2 microglobulin, serum     Status: Abnormal   Collection Time: 06/10/19 12:43 PM  Result Value Ref Range   Beta-2 Microglobulin 5.2 (H) 0.6 - 2.4 mg/L    Comment: (NOTE) Siemens Immulite 2000 Immunochemiluminometric assay (ICMA) Values obtained with different assay methods or kits cannot be used interchangeably. Results cannot be interpreted as absolute evidence of the presence or absence of malignant disease. Performed At: [facility] [place], [place] [id number] Rush Farmer MD YB:[id number]   Lactate dehydrogenase     Status: None   Collection Time: 06/10/19 12:43 PM  Result Value Ref Range   LDH 185 98 - 192 U/L    Comment: Performed at [facility], [address], [place], [place] [address]    Assessment/Plan:  Hypertension blood pressure control important in reducing the progression of atherosclerotic disease. On appropriate oral medications.   Hyperlipidemia lipid control important in reducing the progression of atherosclerotic disease. Continue statin therapy   CAD (coronary artery disease) Continue cardiac and antihypertensive medications as already ordered and reviewed, no changes at this time. Continue statin as ordered and reviewed, no changes at this time Nitrates PRN for chest pain   Multiple myeloma (HCC) Followed by oncology  Ulcerated varicose veins of leg, right ([place]) The  patient is had 2 episodes of severe hemorrhage from varicosities of the right lower extremity.  There is a scab in the area now where it has healed.  I have recommended a venous duplex to be performed in the near future at her convenience to evaluate for reflux that is contributing and creating a high pressure situation.  She clearly needs sclerotherapy for the local varicosities which have bled to reduce the blood flow and lower the risk of bleeding.  She may or may not need laser ablation based on the findings on duplex.  I discussed the pathophysiology and natural history with the patient in some detail.      Leotis Pain 06/21/2019, 10:43 AM   This note was created with Dragon medical transcription system.  Any errors from dictation are unintentional.

## 2019-06-21 NOTE — Assessment & Plan Note (Signed)
The patient is had 2 episodes of severe hemorrhage from varicosities of the right lower extremity.  There is a scab in the area now where it has healed.  I have recommended a venous duplex to be performed in the near future at her convenience to evaluate for reflux that is contributing and creating a high pressure situation.  She clearly needs sclerotherapy for the local varicosities which have bled to reduce the blood flow and lower the risk of bleeding.  She may or may not need laser ablation based on the findings on duplex.  I discussed the pathophysiology and natural history with the patient in some detail.

## 2019-06-21 NOTE — Assessment & Plan Note (Signed)
Followed by oncology 

## 2019-06-21 NOTE — Assessment & Plan Note (Signed)
Continue cardiac and antihypertensive medications as already ordered and reviewed, no changes at this time. Continue statin as ordered and reviewed, no changes at this time Nitrates PRN for chest pain  

## 2019-06-21 NOTE — Patient Instructions (Signed)

## 2019-06-22 ENCOUNTER — Emergency Department: Payer: Medicare Other

## 2019-06-22 ENCOUNTER — Emergency Department
Admission: EM | Admit: 2019-06-22 | Discharge: 2019-06-23 | Disposition: A | Discharge status: Home / Self Care | Payer: Medicare Other | Attending: Emergency Medicine | Admitting: Emergency Medicine

## 2019-06-22 ENCOUNTER — Other Ambulatory Visit: Payer: Self-pay

## 2019-06-22 DIAGNOSIS — I509 Heart failure, unspecified: Secondary | ICD-10-CM | POA: Diagnosis not present

## 2019-06-22 DIAGNOSIS — R112 Nausea with vomiting, unspecified: Secondary | ICD-10-CM | POA: Insufficient documentation

## 2019-06-22 DIAGNOSIS — Z8616 Personal history of COVID-19: Secondary | ICD-10-CM | POA: Diagnosis not present

## 2019-06-22 DIAGNOSIS — Z8673 Personal history of transient ischemic attack (TIA), and cerebral infarction without residual deficits: Secondary | ICD-10-CM | POA: Insufficient documentation

## 2019-06-22 DIAGNOSIS — Z79899 Other long term (current) drug therapy: Secondary | ICD-10-CM | POA: Diagnosis not present

## 2019-06-22 DIAGNOSIS — J439 Emphysema, unspecified: Secondary | ICD-10-CM | POA: Diagnosis not present

## 2019-06-22 DIAGNOSIS — I2510 Atherosclerotic heart disease of native coronary artery without angina pectoris: Secondary | ICD-10-CM | POA: Insufficient documentation

## 2019-06-22 DIAGNOSIS — C9000 Multiple myeloma not having achieved remission: Secondary | ICD-10-CM | POA: Diagnosis not present

## 2019-06-22 DIAGNOSIS — N183 Chronic kidney disease, stage 3 unspecified: Secondary | ICD-10-CM | POA: Diagnosis not present

## 2019-06-22 DIAGNOSIS — R42 Dizziness and giddiness: Secondary | ICD-10-CM | POA: Diagnosis not present

## 2019-06-22 DIAGNOSIS — R5381 Other malaise: Secondary | ICD-10-CM | POA: Diagnosis not present

## 2019-06-22 DIAGNOSIS — I447 Left bundle-branch block, unspecified: Secondary | ICD-10-CM

## 2019-06-22 DIAGNOSIS — I10 Essential (primary) hypertension: Secondary | ICD-10-CM | POA: Diagnosis not present

## 2019-06-22 DIAGNOSIS — R531 Weakness: Secondary | ICD-10-CM | POA: Diagnosis not present

## 2019-06-22 DIAGNOSIS — Z8579 Personal history of other malignant neoplasms of lymphoid, hematopoietic and related tissues: Secondary | ICD-10-CM | POA: Diagnosis not present

## 2019-06-22 DIAGNOSIS — I130 Hypertensive heart and chronic kidney disease with heart failure and stage 1 through stage 4 chronic kidney disease, or unspecified chronic kidney disease: Secondary | ICD-10-CM | POA: Insufficient documentation

## 2019-06-22 DIAGNOSIS — R110 Nausea: Secondary | ICD-10-CM | POA: Diagnosis not present

## 2019-06-22 HISTORY — DX: Left bundle-branch block, unspecified: I44.7

## 2019-06-22 HISTORY — DX: Dizziness and giddiness: R42

## 2019-06-22 LAB — CBC WITH DIFFERENTIAL/PLATELET
Abs Immature Granulocytes: 0.02 10*3/uL (ref 0.00–0.07)
Basophils Absolute: 0 10*3/uL (ref 0.0–0.1)
Basophils Relative: 0 %
Eosinophils Absolute: 0.2 10*3/uL (ref 0.0–0.5)
Eosinophils Relative: 3 %
HCT: 28.7 % — ABNORMAL LOW (ref 36.0–46.0)
Hemoglobin: 9.2 g/dL — ABNORMAL LOW (ref 12.0–15.0)
Immature Granulocytes: 0 %
Lymphocytes Relative: 20 %
Lymphs Abs: 1.4 10*3/uL (ref 0.7–4.0)
MCH: 34.1 pg — ABNORMAL HIGH (ref 26.0–34.0)
MCHC: 32.1 g/dL (ref 30.0–36.0)
MCV: 106.3 fL — ABNORMAL HIGH (ref 80.0–100.0)
Monocytes Absolute: 0.5 10*3/uL (ref 0.1–1.0)
Monocytes Relative: 7 %
Neutro Abs: 5.1 10*3/uL (ref 1.7–7.7)
Neutrophils Relative %: 70 %
Platelets: 203 10*3/uL (ref 150–400)
RBC: 2.7 MIL/uL — ABNORMAL LOW (ref 3.87–5.11)
RDW: 13.6 % (ref 11.5–15.5)
WBC: 7.2 10*3/uL (ref 4.0–10.5)
nRBC: 0 % (ref 0.0–0.2)

## 2019-06-22 LAB — COMPREHENSIVE METABOLIC PANEL
ALT: 16 U/L (ref 0–44)
AST: 24 U/L (ref 15–41)
Albumin: 4.4 g/dL (ref 3.5–5.0)
Alkaline Phosphatase: 68 U/L (ref 38–126)
Anion gap: 11 (ref 5–15)
BUN: 23 mg/dL (ref 8–23)
CO2: 19 mmol/L — ABNORMAL LOW (ref 22–32)
Calcium: 9.2 mg/dL (ref 8.9–10.3)
Chloride: 105 mmol/L (ref 98–111)
Creatinine, Ser: 1.6 mg/dL — ABNORMAL HIGH (ref 0.44–1.00)
GFR calc Af Amer: 35 mL/min — ABNORMAL LOW (ref >60)
GFR calc non Af Amer: 31 mL/min — ABNORMAL LOW (ref >60)
Glucose, Bld: 137 mg/dL — ABNORMAL HIGH (ref 70–99)
Potassium: 3.4 mmol/L — ABNORMAL LOW (ref 3.5–5.1)
Sodium: 135 mmol/L (ref 135–145)
Total Bilirubin: 0.6 mg/dL (ref 0.3–1.2)
Total Protein: 8.6 g/dL — ABNORMAL HIGH (ref 6.5–8.1)

## 2019-06-22 LAB — LACTIC ACID, PLASMA: Lactic Acid, Venous: 1.3 mmol/L (ref 0.5–1.9)

## 2019-06-22 LAB — TROPONIN I (HIGH SENSITIVITY): Troponin I (High Sensitivity): 10 ng/L (ref <18)

## 2019-06-22 LAB — LIPASE, BLOOD: Lipase: 39 U/L (ref 11–51)

## 2019-06-22 MED ORDER — SODIUM CHLORIDE 0.9 % IV BOLUS
1000.0000 mL | Freq: Once | INTRAVENOUS | Status: AC
  Administered 2019-06-22: 1000 mL via INTRAVENOUS

## 2019-06-22 MED ORDER — MECLIZINE HCL 25 MG PO TABS: 25.0000 mg | ORAL_TABLET | Freq: Two times a day (BID) | ORAL | 0 refills | Status: DC | PRN

## 2019-06-22 MED ORDER — MECLIZINE HCL 25 MG PO TABS
25.0000 mg | ORAL_TABLET | Freq: Once | ORAL | Status: AC
  Administered 2019-06-22: 21:00:00 25 mg via ORAL
  Filled 2019-06-22: qty 1

## 2019-06-22 NOTE — ED Provider Notes (Signed)
[facility] Emergency Department Provider Note  ____________________________________________  Time seen: Approximately 7:52 PM  I have reviewed the triage vital signs and the nursing notes.   HISTORY  Chief Complaint Dizziness    HPI Bailey Ayala is a 79 y.o. female who presents the emergency department for sudden onset of weakness, dizziness, nausea and emesis.  Patient states that she was sitting at home on her computer, had very rapid onset of feeling weak, then dizzy and then had 2 episodes of vomiting.  Patient states that she still feels nauseated but does not feel dizzy.  She does state at this time that she feels like "I could pass out."  She has had no syncopal episodes since the start of symptoms.  She denies any headache, visual changes, chest pain, shortness of breath, abdominal pain.  Patient had Covid at the beginning of December 2020.  Patient denies any ongoing symptoms from same.  Patient has a history of anemia, CHF, COPD, diabetes, hyperlipidemia, hypertension, MI, TIA.         Past Medical History:  Diagnosis Date  . Allergy 1980  . Anemia 09-2017  . Arthritis   . Blood dyscrasia    BLEEDS EASILY   . Blood transfusion without reported diagnosis 2011 or 12  . Cataract ?  . CHF (congestive heart failure) ([place]) ?  Marland Kitchen Clotting disorder ([place]) ?  Marland Kitchen COPD (chronic obstructive pulmonary disease) ([place])   . Diabetes mellitus without complication ([place])   . Diffuse cystic mastopathy   . Emphysema of lung ([place]) ?  Marland Kitchen Hyperlipidemia   . Hypertension   . Hypothyroidism   . L4-L5 disc bulge   . Multiple myeloma ([place])   . Myocardial infarction ([place]) 2019  . Osteoporosis, post-menopausal   . PAD (peripheral artery disease) ([place])   . Renal insufficiency   . Shortness of breath dyspnea    WITH EXERTION   . Stroke ([place])    TIA     2016     WAS FOUND ON SCAN ORDERED FROM NEUROL  . Thyroid disease   . TIA (transient ischemic attack) 2016   left hand weakness    Patient Active Problem List   Diagnosis Date Noted  . Lab test positive for detection of COVID-19 virus 04/11/2019  . Skin callus 04/08/2019  . Anemia in chronic kidney disease 01/18/2019  . Chronic kidney disease, stage III (moderate) 01/18/2019  . Multiple myeloma ([place]) 01/18/2019  . History of CVA (cerebrovascular accident) 11/28/2018  . CAD (coronary artery disease) 11/28/2018  . Facial droop 11/28/2018  . Abnormal stress test   . Pain due to onychomycosis of toenails of both feet 10/18/2018  . Osteoarthritis of knee 07/16/2018  . Chronic toe ulcer, left, limited to breakdown of skin ([place]) 06/28/2018  . Neck pain 09/05/2017  . Left lower lobe pulmonary nodule 08/18/2017  . Abdominal aortic aneurysm (AAA) without rupture ([place]) 08/18/2017  . Goals of care, counseling/discussion 08/18/2017  . Weight loss 07/01/2017  . 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 Macrocytic anemia 06/30/2017  . Benign hypertensive heart and CKD, stage 3 (GFR 30-59), w CHF ([place]) 05/29/2017  . Drug-induced constipation 11/07/2016  . Primary osteoarthritis involving multiple joints 11/07/2016  . Simple chronic bronchitis ([place]) 11/07/2016  . Status post total shoulder arthroplasty 11/03/2016  . Advanced care planning/counseling discussion 11/02/2016  . History of total knee arthroplasty 08/04/2016  . Localized, primary osteoarthritis of shoulder region 08/04/2016  . S/P shoulder replacement 01/14/2016  .  Hypothyroid 11/04/2014  . Hyperlipidemia   . Hypertension   . Ulcerated varicose veins of leg, right ([place]) 09/30/2014  . Peripheral arterial occlusive disease ([place]) 09/30/2014    Past Surgical History:  Procedure Laterality Date  . ABDOMINAL HYSTERECTOMY  1990  . BACK SURGERY  2011  . BREAST EXCISIONAL BIOPSY Left    2010?   Marland Kitchen BREAST SURGERY  1975   A lump removed left breast  . CATARACT EXTRACTION, BILATERAL    . COLONOSCOPY  1998  . EYE SURGERY Bilateral  05/06/14  . JOINT REPLACEMENT     LT KNEE  . KNEE SURGERY Left 2012  . LEFT HEART CATH AND CORONARY ANGIOGRAPHY Left 11/19/2018   Procedure: LEFT HEART CATH AND CORONARY ANGIOGRAPHY;  Surgeon: Wellington Hampshire, MD;  Location: [facility];  Service: Cardiovascular;  Laterality: Left;  . OOPHORECTOMY  1990  . parathyroid transplant     2/3 THYROID REMOVED   ([place])  . SPINE SURGERY    . THYROID SURGERY    . TONSILLECTOMY    . TOTAL SHOULDER ARTHROPLASTY Left 01/14/2016   Procedure: LEFT TOTAL SHOULDER ARTHROPLASTY;  Surgeon: Justice Britain, MD;  Location: [facility];  Service: Orthopedics;  Laterality: Left;  . TOTAL SHOULDER ARTHROPLASTY Right 11/03/2016   Procedure: RIGHT TOTAL SHOULDER ARTHROPLASTY;  Surgeon: Justice Britain, MD;  Location: [facility];  Service: Orthopedics;  Laterality: Right;  . ULNAR NERVE REPAIR     LEFT  . vein closure procedure Left 2010    Prior to Admission medications   Medication Sig Start Date End Date Taking? Authorizing Provider  acetaminophen (TYLENOL) 500 MG tablet Take 500 mg by mouth every 6 (six) hours as needed.    [provider]  aspirin EC 81 MG tablet Take 1 tablet (81 mg total) by mouth daily. 08/05/16   Wellington Hampshire, MD  atorvastatin (LIPITOR) 40 MG tablet Take 1 tablet (40 mg total) by mouth daily. 12/05/18 06/07/19  Rise Mu, PA-C  benazepril (LOTENSIN) 20 MG tablet Take 1 tablet (20 mg total) by mouth daily. 06/28/18   Guadalupe Maple, MD  ferrous sulfate 325 (65 FE) MG tablet Take 325 mg by mouth daily with breakfast.    [provider]  levothyroxine (SYNTHROID, LEVOTHROID) 100 MCG tablet Take 1 tablet (100 mcg total) by mouth daily. 06/28/18   Guadalupe Maple, MD  metoprolol tartrate (LOPRESSOR) 50 MG tablet Take 1 tablet (50 mg total) by mouth 2 (two) times daily. 12/26/18 12/26/19  Rise Mu, PA-C  Multiple Vitamin (MULTIVITAMIN WITH MINERALS) TABS tablet Take 1 tablet by mouth daily.    [provider]  traMADol  (ULTRAM) 50 MG tablet tramadol 50 mg tablet  TAKE 1 TABLET BY MOUTH EVERY NIGHT AS NEEDED FOR PAIN    [provider]  triamcinolone cream (KENALOG) 0.1 % Apply 1 application topically 2 (two) times daily. 12/28/17   Guadalupe Maple, MD    Allergies Codeine and Simvastatin  Family History  Problem Relation Age of Onset  . Heart disease Mother        MI  . Arthritis Mother   . Stroke Mother   . Hyperlipidemia Mother   . Hypertension Mother   . Varicose Veins Mother   . Heart disease Father        MI  . Hyperlipidemia Father   . Arthritis Father   . Cancer Father   . COPD Father   . Hearing loss Father   .  Hypertension Father   . Vision loss Father   . Diabetes Sister   . COPD Sister   . Heart disease Sister   . Hyperlipidemia Sister   . Hypertension Sister   . Hyperlipidemia Brother   . COPD Brother   . Diabetes Brother   . Heart disease Brother   . Hypertension Brother   . Arthritis Sister   . Hyperlipidemia Sister   . Hypertension Sister   . Vision loss Sister   . Varicose Veins Sister   . Arthritis Brother   . Varicose Veins Brother   . Arthritis Daughter   . Arthritis Daughter   . Diabetes Daughter   . Cancer Paternal Aunt   . COPD Sister   . Heart disease Sister   . Hyperlipidemia Sister   . Hypertension Sister   . Varicose Veins Sister   . Heart disease Maternal Uncle   . Heart disease Paternal Uncle     Social History Social History   Tobacco Use  . Smoking status: Former Smoker    Packs/day: 3.00    Years: 25.00    Pack years: 75.00    Types: Cigarettes    Quit date: 11/03/1989    Years since quitting: 29.6  . Smokeless tobacco: Never Used  Substance Use Topics  . Alcohol use: Not Currently    Alcohol/week: 0.0 standard drinks  . Drug use: No     Review of Systems  Constitutional: No fever/chills.  Positive for presyncopal feeling.  Positive for dizziness Eyes: No visual changes. No discharge ENT: No upper respiratory  complaints. Cardiovascular: no chest pain. Respiratory: no cough. No SOB. Gastrointestinal: No abdominal pain.  Sudden onset of nausea and vomiting.  No diarrhea.  No constipation. Genitourinary: Negative for dysuria. No hematuria Musculoskeletal: Negative for musculoskeletal pain. Skin: Negative for rash, abrasions, lacerations, ecchymosis. Neurological: Negative for headaches, focal weakness or numbness. 10-point ROS otherwise negative.  ____________________________________________   PHYSICAL EXAM:  VITAL SIGNS: ED Triage Vitals [06/22/19 1945]  Enc Vitals Group     BP      Pulse      Resp      Temp      Temp src      SpO2      Weight 170 lb (77.1 kg)     Height '5\' 5"'  (1.651 m)     Head Circumference      Peak Flow      Pain Score 0     Pain Loc      Pain Edu?      Excl. in [place]?      Constitutional: Alert and oriented. Well appearing and in no acute distress. Eyes: Conjunctivae are normal. PERRL. EOMI. Head: Atraumatic. ENT:      Ears:       Nose: No congestion/rhinnorhea.      Mouth/Throat: Mucous membranes are moist.  Neck: No stridor.  Neck is supple full range of motion Hematological/Lymphatic/Immunilogical: No cervical lymphadenopathy. Cardiovascular: Normal rate, regular rhythm. Normal S1 and S2.  Good peripheral circulation. Respiratory: Normal respiratory effort without tachypnea or retractions. Lungs CTAB. Good air entry to the bases with no decreased or absent breath sounds. Gastrointestinal: Bowel sounds 4 quadrants. Soft and nontender to palpation. No guarding or rigidity. No palpable masses. No distention. No CVA tenderness. Musculoskeletal: Full range of motion to all extremities. No gross deformities appreciated. Neurologic:  Normal speech and language. No gross focal neurologic deficits are appreciated.  Cranial nerves II through XII grossly intact.  Negative Romberg's and pronator drift.  Equal grip strength bilaterally. Skin:  Skin is warm, dry and  intact. No rash noted. Psychiatric: Mood and affect are normal. Speech and behavior are normal. Patient exhibits appropriate insight and judgement.   ____________________________________________   LABS (all labs ordered are listed, but only abnormal results are displayed)  Labs Reviewed  COMPREHENSIVE METABOLIC PANEL  LACTIC ACID, PLASMA  LACTIC ACID, PLASMA  CBC WITH DIFFERENTIAL/PLATELET  URINALYSIS, COMPLETE (UACMP) WITH MICROSCOPIC  LIPASE, BLOOD  TROPONIN I (HIGH SENSITIVITY)   ____________________________________________  EKG   ____________________________________________  RADIOLOGY I personally viewed and evaluated these images as part of my medical decision making, as well as reviewing the written report by the radiologist.  No results found.  ____________________________________________    PROCEDURES  Procedure(s) performed:    Procedures    Medications  sodium chloride 0.9 % bolus 1,000 mL (has no administration in time range)     ____________________________________________   INITIAL IMPRESSION / ASSESSMENT AND PLAN / ED COURSE  Pertinent labs & imaging results that were available during my care of the patient were reviewed by me and considered in my medical decision making (see chart for details).  Review of the [place] CSRS was performed in accordance of the [place] prior to dispensing any controlled drugs.  Clinical Course as of Jun 21 2040  Sat Jun 22, 2019  2013 Patient presented to emergency department for sudden onset of dizziness, nausea, emesis.  Patient states that she was sitting at her computer, suddenly became very dizzy feeling, feeling like she was going to pass out and then experienced nausea with 2 episodes of emesis.  Patient states that the dizziness has improved, the nausea is still present.  Patient also still feels like "I could pass out."  Patient has not experienced syncope since the onset of symptoms.  She denies any headache,  visual changes, chest pain, shortness of breath, abdominal pain, nausea or vomiting.  Patient will be evaluated with labs, imaging.   [JC]    Clinical Course User Index [JC] Yassine Brunsman, Charline Bills, PA-C          Patient presented to the emergency department with sudden onset of dizziness, nausea and emesis.  Overall, exam is reassuring.  Differential includes ACS/STEMI, CVA, DKA, gastroenteritis, vertigo, dehydration.  At this time work-up is still pending.  Pending final diagnosis and disposition, patient care will be transferred to attending provider, Dr. Darl Householder.       This chart was dictated using voice recognition software/Dragon. Despite best efforts to proofread, errors can occur which can change the meaning. Any change was purely unintentional.    Darletta Moll, PA-C 06/22/19 2042    Drenda Freeze, MD 06/25/19 [phone]

## 2019-06-22 NOTE — ED Triage Notes (Signed)
Pt called ems for dizziness, vomiting x2 that started after 5pm

## 2019-06-22 NOTE — Discharge Instructions (Signed)
Take meclizine for dizziness   See your doctor.   Return to ER if you have worse dizziness, passing out.

## 2019-06-23 DIAGNOSIS — R42 Dizziness and giddiness: Secondary | ICD-10-CM | POA: Diagnosis not present

## 2019-06-23 NOTE — ED Notes (Signed)
This RN was able to contact Bailey Ayala husband Bailey Ayala) as per the pt and was able to get in contact with Bailey Ayala and inform her that her mother is being discharged and is awaiting pick up. Bailey Ayala informed this RN that she would be on the way shortly.

## 2019-06-23 NOTE — ED Notes (Signed)
This RN attempted to call pt's daughter x3 to notify her that her mother was being discharged and needed to be picked up but was unable to make contact. Will attempt to call again at a later time

## 2019-06-23 NOTE — ED Notes (Signed)
This RN attempted to reach out to the pt's out of state daughter [name]) to obtain an alternate number for her sister [name] (who will be picking the pt up).   As per [name], Laura's home number has been added to the system   This RN attempted to called the home number of Delrae Sawyers x2 and was unable to reach contact. Pt notified of such and states she has no other way to get home.

## 2019-06-23 NOTE — ED Notes (Signed)
This RN attempted to call pt's daughter Delrae Sawyers x2 and was unable to reach her or leave a voice mail at this time. Pt notified of such

## 2019-06-24 NOTE — Progress Notes (Signed)
[facility]  [address], [address] [place], [place] [address] Phone: [phone]  Fax: [phone]   Clinic Day:  06/26/2019  Referring physician: Guadalupe Maple, MD  Chief Complaint: Bailey Ayala is a 79 y.o. female with smoldering myeloma who is seen for a 2 week assessment and discussion regarding direction of therapy.   HPI: The patient was last seen in the medical oncology clinic on 06/09/2018. At that time, she was fatigued. She described recurrent significant bleeding from her feet. She had hip pain and wanted to proceed with hip replacement. LDH was 185. Beta-2 microglobulin was 5.2.   She was seen by Dr. Lucky Cowboy on 06/21/2019 for ulcerated varicose veins.  He recommended a venous duplex to evaluate for reflux contributing and creating a prior pressure situation. Sclerotherapy was felt needed for local varicosities to reduce the blood flow and lower the risk of bleeding.  She was felt to possibly need laser ablation based on findings of the duplex.   She was seen in the [facility] ER on 02/27/20201 with weakness, dizziness, and nausea.  She had rotary nystagmus when looking to the left.  Head MRI revealed no acute findings.  She has chronic small vessel ischemic changes of the cerebral hemispheres.  I spoke to Dr. Paralee Cancel, her orthopedic surgeon.  Plan was for total hip replacement.  Surgery is scheduled for 07/02/2019.  Callie Fielding, her cardiologist, noted the plan to hold her aspirin for only 5 days.  She was presented at the hematology conference on 06/24/2019.  Discussions were held about initiation of therapy for her myeloma after recovery from her planned hip surgery.  During the interim, she has felt "alright". She had a near syncope episode on Saturday, and she went to the ER. She had vertigo and was given meclizine. She notes symptoms are better with medication. She states having chronic watery diarrhea.   Patient and her daughters were  concerned about her health and life expectancy. I informed patient about treatment and a good quality of life. Patient would like a home nurse while going through treatment.   She reports right hip and back pain. She will have a total right hip replacement surgery on 07/02/2019. Her daughters were concerned about her mother having reactions to the pain medication s/p shoulder surgery. I informed them to let Dr. Alvan Dame be aware her previous reactions.  She will have a doppler performed on 07/03/2019. She will see Dr. Aurea Graff PA on 06/28/2019.    Past Medical History:  Diagnosis Date  . Allergy 1980  . Anemia 09-2017  . Arthritis   . Blood dyscrasia    BLEEDS EASILY   . Blood transfusion without reported diagnosis 2011 or 12  . Cataract ?  . CHF (congestive heart failure) ([place]) ?  Marland Kitchen Clotting disorder ([place]) ?  Marland Kitchen COPD (chronic obstructive pulmonary disease) ([place])   . Diabetes mellitus without complication ([place])   . Diffuse cystic mastopathy   . Emphysema of lung ([place]) ?  Marland Kitchen Hyperlipidemia   . Hypertension   . Hypothyroidism   . L4-L5 disc bulge   . Multiple myeloma ([place])   . Myocardial infarction ([place]) 2019  . Osteoporosis, post-menopausal   . PAD (peripheral artery disease) ([place])   . Renal insufficiency   . Shortness of breath dyspnea    WITH EXERTION   . Stroke ([place])    TIA     2016     WAS FOUND ON SCAN ORDERED FROM NEUROL  .  Thyroid disease   . TIA (transient ischemic attack) 2016   left hand weakness    Past Surgical History:  Procedure Laterality Date  . ABDOMINAL HYSTERECTOMY  1990  . BACK SURGERY  2011  . BREAST EXCISIONAL BIOPSY Left    2010?   Marland Kitchen BREAST SURGERY  1975   A lump removed left breast  . CATARACT EXTRACTION, BILATERAL    . COLONOSCOPY  1998  . EYE SURGERY Bilateral 05/06/14  . JOINT REPLACEMENT     LT KNEE  . KNEE SURGERY Left 2012  . LEFT HEART CATH AND CORONARY ANGIOGRAPHY Left 11/19/2018   Procedure: LEFT HEART CATH AND CORONARY ANGIOGRAPHY;  Surgeon:  Wellington Hampshire, MD;  Location: [facility];  Service: Cardiovascular;  Laterality: Left;  . OOPHORECTOMY  1990  . parathyroid transplant     2/3 THYROID REMOVED   (GOITER)  . SPINE SURGERY    . THYROID SURGERY    . TONSILLECTOMY    . TOTAL SHOULDER ARTHROPLASTY Left 01/14/2016   Procedure: LEFT TOTAL SHOULDER ARTHROPLASTY;  Surgeon: Justice Britain, MD;  Location: [facility];  Service: Orthopedics;  Laterality: Left;  . TOTAL SHOULDER ARTHROPLASTY Right 11/03/2016   Procedure: RIGHT TOTAL SHOULDER ARTHROPLASTY;  Surgeon: Justice Britain, MD;  Location: [facility];  Service: Orthopedics;  Laterality: Right;  . ULNAR NERVE REPAIR     LEFT  . vein closure procedure Left 2010    Family History  Problem Relation Age of Onset  . Heart disease Mother        MI  . Arthritis Mother   . Stroke Mother   . Hyperlipidemia Mother   . Hypertension Mother   . Varicose Veins Mother   . Heart disease Father        MI  . Hyperlipidemia Father   . Arthritis Father   . Cancer Father   . COPD Father   . Hearing loss Father   . Hypertension Father   . Vision loss Father   . Diabetes Sister   . COPD Sister   . Heart disease Sister   . Hyperlipidemia Sister   . Hypertension Sister   . Hyperlipidemia Brother   . COPD Brother   . Diabetes Brother   . Heart disease Brother   . Hypertension Brother   . Arthritis Sister   . Hyperlipidemia Sister   . Hypertension Sister   . Vision loss Sister   . Varicose Veins Sister   . Arthritis Brother   . Varicose Veins Brother   . Arthritis Daughter   . Arthritis Daughter   . Diabetes Daughter   . Cancer Paternal Aunt   . COPD Sister   . Heart disease Sister   . Hyperlipidemia Sister   . Hypertension Sister   . Varicose Veins Sister   . Heart disease Maternal Uncle   . Heart disease Paternal Uncle     Social History:  reports that she quit smoking about 29 years ago. Her smoking use included cigarettes. She has a 75.00 pack-year smoking history. She  has never used smokeless tobacco. She reports previous alcohol use. She reports that she does not use drugs.  Patient is a former 3 pack per day smoker for 25 years (75 pack year). Patient lives in [place]. She is a former Electrical engineer. Patient denies known exposures to radiation on toxins. The patient is accompanied by her daughters, Zacarias Pontes and [place], via Ipad today.  Allergies:  Allergies  Allergen Reactions  . Codeine  Other (See Comments)    hallucinations    . Simvastatin Diarrhea and Nausea Only    Current Medications: Current Outpatient Medications  Medication Sig Dispense Refill  . acetaminophen (TYLENOL) 500 MG tablet Take 500-1,000 mg by mouth every 6 (six) hours as needed (for pain.).     Marland Kitchen aspirin EC 81 MG tablet Take 1 tablet (81 mg total) by mouth daily. 90 tablet 3  . atorvastatin (LIPITOR) 40 MG tablet Take 1 tablet (40 mg total) by mouth daily. 90 tablet 3  . benazepril (LOTENSIN) 20 MG tablet Take 1 tablet (20 mg total) by mouth daily. 90 tablet 4  . ferrous sulfate 325 (65 FE) MG tablet Take 325 mg by mouth daily with breakfast.    . levothyroxine (SYNTHROID, LEVOTHROID) 100 MCG tablet Take 1 tablet (100 mcg total) by mouth daily. 90 tablet 4  . meclizine (ANTIVERT) 25 MG tablet Take 1 tablet (25 mg total) by mouth 2 (two) times daily as needed for dizziness. 15 tablet 0  . metoprolol tartrate (LOPRESSOR) 50 MG tablet Take 1 tablet (50 mg total) by mouth 2 (two) times daily. 60 tablet 11  . Multiple Vitamin (MULTIVITAMIN WITH MINERALS) TABS tablet Take 1 tablet by mouth daily.    . traMADol (ULTRAM) 50 MG tablet Take 50 mg by mouth at bedtime as needed.    . triamcinolone cream (KENALOG) 0.1 % Apply 1 application topically 2 (two) times daily. (Patient taking differently: Apply 1 application topically 2 (two) times daily as needed (skin irritation/rash.). ) 30 g 0   No current facility-administered medications for this visit.    Review of Systems  Constitutional:  Positive for weight loss (2 lbs). Negative for chills, diaphoresis, fever and malaise/fatigue.       Feels "alright". Perform ADLs.  HENT: Negative.  Negative for congestion, ear pain, nosebleeds, sinus pain and sore throat.   Eyes: Negative.  Negative for blurred vision, double vision and photophobia.  Respiratory: Negative for cough (chronic; stable), hemoptysis, sputum production and shortness of breath (exertional- stable).        COPD.  Cardiovascular: Negative for chest pain, palpitations, orthopnea, claudication, leg swelling (varicose vein swelling) and PND.       Varicose veins with recurrent bleeding.  Gastrointestinal: Positive for diarrhea (chronic watery loose stools). Negative for abdominal pain, blood in stool, constipation, heartburn, melena, nausea and vomiting.  Genitourinary: Negative.  Negative for dysuria, frequency, hematuria and urgency.  Musculoskeletal: Positive for back pain (chronic) and joint pain (right hip). Negative for falls, myalgias and neck pain.  Skin: Negative.  Negative for itching and rash.  Neurological: Negative.  Negative for dizziness, tremors, sensory change, speech change, focal weakness, weakness and headaches.       Aneurysm. Near syncope episode on 06/22/2019. Vertigo.  Endo/Heme/Allergies: Bruises/bleeds easily (varicose veins).       HYPOthyroidism - on levothyroxine.  Psychiatric/Behavioral: Negative.  Negative for depression and memory loss. The patient is not nervous/anxious and does not have insomnia.   All other systems reviewed and are negative.  Performance status (ECOG):  2  Vitals Blood pressure 139/65, pulse 63, temperature (!) 97.1 F (36.2 C), temperature source Tympanic, resp. rate 18, weight 173 lb 6.3 oz (78.7 kg), SpO2 99 %.   Physical Exam  Constitutional: She is oriented to person, place, and time. She appears well-developed and well-nourished. No distress.  She has a rolling walker at her side.  HENT:  Head:  Normocephalic and atraumatic.  Brown hair pulled back.  Mask.  Eyes: Conjunctivae and EOM are normal. No scleral icterus.  Blue eyes.  Neurological: She is alert and oriented to person, place, and time.  Skin: She is not diaphoretic.  Psychiatric: She has a normal mood and affect. Her behavior is normal. Judgment and thought content normal.  Nursing note and vitals reviewed.   No visits with results within 3 Day(s) from this visit.  Latest known visit with results is:  Admission on 06/22/2019, Discharged on 06/23/2019  Component Date Value Ref Range Status  . Sodium 06/22/2019 135  135 - 145 mmol/L Final  . Potassium 06/22/2019 3.4* 3.5 - 5.1 mmol/L Final  . Chloride 06/22/2019 105  98 - 111 mmol/L Final  . CO2 06/22/2019 19* 22 - 32 mmol/L Final  . Glucose, Bld 06/22/2019 137* 70 - 99 mg/dL Final   Glucose reference range applies only to samples taken after fasting for at least 8 hours.  . BUN 06/22/2019 23  8 - 23 mg/dL Final  . Creatinine, Ser 06/22/2019 1.60* 0.44 - 1.00 mg/dL Final  . Calcium 06/22/2019 9.2  8.9 - 10.3 mg/dL Final  . Total Protein 06/22/2019 8.6* 6.5 - 8.1 g/dL Final  . Albumin 06/22/2019 4.4  3.5 - 5.0 g/dL Final  . AST 06/22/2019 24  15 - 41 U/L Final  . ALT 06/22/2019 16  0 - 44 U/L Final  . Alkaline Phosphatase 06/22/2019 68  38 - 126 U/L Final  . Total Bilirubin 06/22/2019 0.6  0.3 - 1.2 mg/dL Final  . GFR calc non Af Amer 06/22/2019 31* >60 mL/min Final  . GFR calc Af Amer 06/22/2019 35* >60 mL/min Final  . Anion gap 06/22/2019 11  5 - 15 Final   Performed at [facility], [address], [place], [place] [address]  . Troponin I (High Sensitivity) 06/22/2019 10  <18 ng/L Final   Comment: (NOTE) Elevated high sensitivity troponin I (hsTnI) values and significant  changes across serial measurements may suggest ACS but many other  chronic and acute conditions are known to elevate hsTnI results.  Refer to the "Links" section for chest pain  algorithms and additional  guidance. Performed at [facility], [address] [place], [place] [address]   . WBC 06/22/2019 7.2  4.0 - 10.5 K/uL Final  . RBC 06/22/2019 2.70* 3.87 - 5.11 MIL/uL Final  . Hemoglobin 06/22/2019 9.2* 12.0 - 15.0 g/dL Final  . HCT 06/22/2019 28.7* 36.0 - 46.0 % Final  . MCV 06/22/2019 106.3* 80.0 - 100.0 fL Final  . MCH 06/22/2019 34.1* 26.0 - 34.0 pg Final  . MCHC 06/22/2019 32.1  30.0 - 36.0 g/dL Final  . RDW 06/22/2019 13.6  11.5 - 15.5 % Final  . Platelets 06/22/2019 203  150 - 400 K/uL Final  . nRBC 06/22/2019 0.0  0.0 - 0.2 % Final  . Neutrophils Relative % 06/22/2019 70  % Final  . Neutro Abs 06/22/2019 5.1  1.7 - 7.7 K/uL Final  . Lymphocytes Relative 06/22/2019 20  % Final  . Lymphs Abs 06/22/2019 1.4  0.7 - 4.0 K/uL Final  . Monocytes Relative 06/22/2019 7  % Final  . Monocytes Absolute 06/22/2019 0.5  0.1 - 1.0 K/uL Final  . Eosinophils Relative 06/22/2019 3  % Final  . Eosinophils Absolute 06/22/2019 0.2  0.0 - 0.5 K/uL Final  . Basophils Relative 06/22/2019 0  % Final  . Basophils Absolute 06/22/2019 0.0  0.0 - 0.1 K/uL Final  . Immature Granulocytes 06/22/2019 0  %  Final  . Abs Immature Granulocytes 06/22/2019 0.02  0.00 - 0.07 K/uL Final   Performed at [facility], [address] [place], [place] [address]  . Lipase 06/22/2019 39  11 - 51 U/L Final   Performed at [facility], [address] [place], [place] [address]  . Lactic Acid, Venous 06/22/2019 1.3  0.5 - 1.9 mmol/L Final   Performed at [facility], [address] [place], [place] [address]    Assessment:  DIOSELINA BRUMBAUGH is a 79 y.o. female with smoldering multiple myeloma. SPEPon 06/21/2017 revealed a 1.2 gm/dL monoclonal spike. Random urine revealed 4.6 mg/dL protein and a 40.6% M-spike. Calcium, albumin, and serum protein were normal.  Work-up on 03/08/2019revealed a hematocrit of 31.1, hemoglobin 10.4, MCV 97.4, platelets  227,000, WBC 5700 with an ANC of 3300. SPEPrevealed a 1.1 gm/dL IgG monoclonal protein with lambda light chain specificity and monoclonal free lambda light chains(Bence-Jones protein). IgG was 1660 ([phone]) and IgA was 48 (64-422). Kappa free light chains were 23.7, lambda free light chains were 142.2 and ratio 0.17 (0.26-1.65). Beta2-microglobulin was 4.7 (0.6-2.4). Normal studies included: B12, folate, and ferritin (75). TSHwas 0.204 (low). 24 hour UPEPrevealed kappa free light chains 28.80 mg/L, lambda free light chains 63.80 mg/L and ration 0.45 (2.04 - 10.37). M spike was 27.4% (16 mg/24 hours).  Bone surveyon 06/30/2017 revealed no suspicious focal bony lesions.  Bone marrow aspirate and biopsyon 07/26/2017 revealed a slightly hypercellular marrow for age with trilineage hematopoiesis. There was 13% plasmacytosis (10-15% by CD138 IHC).  Plasma cells were in interstitial cells and small clusters. Lambda light chains appeared restricted. Features were felt compatible with plasma cell neoplasm.  Bone marrow aspirate and biopsy  on 05/29/2019 revealed a normocellular marrow for age (20-30%) with increased monoclonal plasma cells (15% aspirate, 20-30% CD138 by IHC).  Plasma cells were lambda restricted.  There were no myelodysplastic changes.  Storage iron was present.  Flow cytometry revealed no monoclonal B-cell or phenotypically aberrant T-cell population.  Findings were consistent with persistent plasma cell myeloma.  Peripheral blood revealed a macrocytic anemia with rouleaux formation.  Cytogenetics were normal (47, XX).  FISH for MDS revealed gain of KMT2A (MLL) or chromosome 11/11q (4F. 5.0%, normal < 3.1%).  FISH for myeloma revealed a deletion of NF1 or chromosome 17q11 (2R1G, 89%, not seen in validation studies).  PET scanon 08/14/2017 revealed no hypermetabolic lesions of myeloma. There was a 3.6 cm infrarenal aortic aneurysm. Ultrasound in 2 years was recommended.  She had a 4 mm LLL noduletoo small to characterize.  SPEPhas been followed: 1.1 on 06/30/2017, 1.2 on 11/14/2017, 1.3 on 01/26/2018, 1.2 on 05/10/2018, 1.4 on 08/08/2018, 1.2 on 11/09/2018, 1.4 on 02/11/2019, and 1.6 on 05/09/2019. IgGhas been followed: 1660 on 06/30/2017, 1684 on 11/14/2017, 1795 on 01/26/2018, and 2054 on 08/08/2018.  Lambda free light chainshave been followed:142.2 (ratio 0.17)on03/11/2017, 214.4(ratio 0.09)on01/16/2020, 209.3(ratio 0.08)on04/14/2020, 220.2(ratio 0.07)on07/17/2020, 228.9(ratio 0.08)on 02/11/2019,and 258.4 (ratio 0.07)on01/14/2021.  Chest CTon 01/24/2018 revealed persistent LLL nodule. Area calcified and felt to be consistent with a granuloma. There were no other suspicious pulmonary nodules. There was new Schmorl's node formation involving the inferior endplate of Q65. There were stable small scattered osseous lesions related to underlying multiple myeloma.  PET scanon 05/15/2018 revealed nohypermetabolic lesions characteristic of active myeloma. There was stable hypermetabolic apical wall of the left ventricle, there waspotentially a small pseudoaneurysmin this vicinity, but no appreciable mass was noted on 01/24/2018. There was aninfrarenal abdominal aortic aneurysm, 3.6 cm  in diameter. Follow-up ultrasound in 2 years was recommended. Other imaging findings of potential clinical significance: aortic atherosclerosis and coronary atherosclerosis.  Bone survery on01/25/2021 showed numerous new lytic lesions throughout the skeleton including the posterior calvarium, bilateral humeri, bilateral iliac bones, left inferior pubic ramus, bilateral femurs.  She has stage III chronic kidney disease(Cr 2.25 on 05/27/2019).   She has a macrocytic anemia. B12, folate, and TSH were normal on 05/10/2018. Retic was 1.6%. Ferritin and iron studies were normal on 05/10/2018. Appetiteremains poor. Last colonoscopywas >10 years  ago (she declines repeat).  She has a 75 pack year smoking history. She has COPD. Chest CT without contraston 07/25/2017 revealed no suspicious pulmonary nodules.  She is scheduled to undergo right hip replacement on 07/02/2019.  Symptomatically, she has right hip discomfort affecting her quality of life.  She was recently diagnosed with vertigo.  She denies any new concerns.  Plan: 1.  Multiple myeloma Symptomatically, she notes no symptoms associated with myeloma. M spikewas 1.6 gm/dL on 05/09/2019. Lambda free light [id number](ratio 0.07). Bone surveyon 05/20/2019 revealednumerous new lytic lesions throughout the skeleton. Bone marrow aspirate on 05/29/2019 revealed a normocellular marrow for age (20-30%) with increased monoclonal plasma cells (20-30% CD138 IHC).               Cytogenetics were normal (52, XX).               FISH for myeloma revealed a deletion of NF1 or chromosome 17q11 (2R1G, 89%, not seen in validation studies). Marrow plasmacytosis has increased from 10-15% to 20-30% since 07/2017.  Discuss CRAB criteria.             Patient has renal dysfunction, anemia (Hgb 9.1), and lytic bone lesions.               Calcium is normal. Review treatment plan:             Patient is not a transplant candidate given her age and comorbidities.   Patient in agreement              Review plan for VRd (Velcade 1.3 mg/m2 SQ day 1,4,8,11, Revlimid po day 1-14, and Decadron 20 mg po on day 1,2,4,5,8,9,11 and 12) every 21 days for 8 cycles.                          Consider VRd with weekly Velcade (day 1,8,15) and Decadron based on tolerance.                         Patient will then receive maintenance Rd until progression or toxicity.                         Potential side effects reviewed.                         She will require herpes zoster prophylaxis and antithrombotic therapy (aspirin).  Treatment will begin approximately 1 month s/p hip surgery (once cleared by Dr  Alvan Dame).   Adjust anti-thrombotic therapy as per orthopedics. Multiple questions asked by the patient and her daughters and answered . Schedule chemotherapy class. Anticipate treatment to begin at the end of 06/2019 or beginning of 07/2019. 3. Back and right hip pain PET scan on 01/21/2020revealed no active myeloma. Bone survey revealed multiple lytic lesions. Patient has known advanced right hip osteoarthritis.  Treatment plan discussed with Dr Alvan Dame. 4. Renal insufficieny Creatinine 1.39on 05/09/2019, 2.25 on 05/27/2019, and 1.60 on 06/22/2019.             Discuss use of Retacrit as needed for anemia due to renal disease. 5. Macrocytic anemia Hematocrit 28.7.  Hemoglobin 9.1.  MCV 108.3 on 05/29/2019.  Hematocrit 28.7.  Hemoglobin 9.2.  MCV 106.3 on 06/22/2019.             B12was 911 and folate 60.9. on 05/16/2019. Shehas no liver disease. Bone marrow on04/06/2017 and 05/29/2019 revealed no evidence of MDS. 6.   Vascular issues in feet             Patient notes significant blood loss secondary to fragile blood vessels in feet.             She has been seen by vascular surgery.    She is felt to need sclerotherapy.   She may need laser ablation based on duplex study.  7.   RTC 2-3 weeks after surgery for MD assessment and finalization of treatment plan.  I discussed the assessment and treatment plan with the patient.  The patient was provided an opportunity to ask questions and all were answered.  The patient agreed with the plan and demonstrated an understanding of the instructions.  The patient was advised to call back if the symptoms worsen or if the condition fails to improve as anticipated.  I provided 25 minutes of face-to-face time during this this encounter and > 50% was spent counseling as documented under my assessment and plan.  An additional 10-15 minutes were spent reviewing her chart (Epic and [facility] [place]) including notes, labs, and studies since her last visit as well as talking to her orthopedic surgeon and writing her chemotherapy.    Lequita Asal, MD, PhD    06/26/2019, 1:27 PM  I, Selena Batten, am acting as scribe for [employer]. Mike Gip, MD, PhD.  I, Jacorian Golaszewski C. Mike Gip, MD, have reviewed the above documentation for accuracy and completeness, and I agree with the above.

## 2019-06-25 ENCOUNTER — Other Ambulatory Visit: Payer: Self-pay

## 2019-06-25 ENCOUNTER — Encounter: Payer: Self-pay | Admitting: Hematology and Oncology

## 2019-06-25 ENCOUNTER — Telehealth: Payer: Self-pay | Admitting: Cardiovascular Disease

## 2019-06-25 ENCOUNTER — Inpatient Hospital Stay: Payer: Medicare Other | Attending: Hematology and Oncology

## 2019-06-25 DIAGNOSIS — Z8673 Personal history of transient ischemic attack (TIA), and cerebral infarction without residual deficits: Secondary | ICD-10-CM | POA: Insufficient documentation

## 2019-06-25 DIAGNOSIS — J449 Chronic obstructive pulmonary disease, unspecified: Secondary | ICD-10-CM | POA: Insufficient documentation

## 2019-06-25 DIAGNOSIS — M810 Age-related osteoporosis without current pathological fracture: Secondary | ICD-10-CM | POA: Insufficient documentation

## 2019-06-25 DIAGNOSIS — E1122 Type 2 diabetes mellitus with diabetic chronic kidney disease: Secondary | ICD-10-CM | POA: Insufficient documentation

## 2019-06-25 DIAGNOSIS — E785 Hyperlipidemia, unspecified: Secondary | ICD-10-CM | POA: Insufficient documentation

## 2019-06-25 DIAGNOSIS — E1151 Type 2 diabetes mellitus with diabetic peripheral angiopathy without gangrene: Secondary | ICD-10-CM | POA: Insufficient documentation

## 2019-06-25 DIAGNOSIS — M25559 Pain in unspecified hip: Secondary | ICD-10-CM | POA: Insufficient documentation

## 2019-06-25 DIAGNOSIS — N289 Disorder of kidney and ureter, unspecified: Secondary | ICD-10-CM | POA: Insufficient documentation

## 2019-06-25 DIAGNOSIS — Z79899 Other long term (current) drug therapy: Secondary | ICD-10-CM | POA: Insufficient documentation

## 2019-06-25 DIAGNOSIS — I130 Hypertensive heart and chronic kidney disease with heart failure and stage 1 through stage 4 chronic kidney disease, or unspecified chronic kidney disease: Secondary | ICD-10-CM | POA: Insufficient documentation

## 2019-06-25 DIAGNOSIS — I509 Heart failure, unspecified: Secondary | ICD-10-CM | POA: Insufficient documentation

## 2019-06-25 DIAGNOSIS — I6782 Cerebral ischemia: Secondary | ICD-10-CM | POA: Insufficient documentation

## 2019-06-25 DIAGNOSIS — I252 Old myocardial infarction: Secondary | ICD-10-CM | POA: Insufficient documentation

## 2019-06-25 DIAGNOSIS — Z7982 Long term (current) use of aspirin: Secondary | ICD-10-CM | POA: Insufficient documentation

## 2019-06-25 DIAGNOSIS — M1990 Unspecified osteoarthritis, unspecified site: Secondary | ICD-10-CM | POA: Insufficient documentation

## 2019-06-25 DIAGNOSIS — M1611 Unilateral primary osteoarthritis, right hip: Secondary | ICD-10-CM | POA: Insufficient documentation

## 2019-06-25 DIAGNOSIS — M549 Dorsalgia, unspecified: Secondary | ICD-10-CM | POA: Insufficient documentation

## 2019-06-25 DIAGNOSIS — C9000 Multiple myeloma not having achieved remission: Secondary | ICD-10-CM | POA: Insufficient documentation

## 2019-06-25 DIAGNOSIS — R42 Dizziness and giddiness: Secondary | ICD-10-CM | POA: Insufficient documentation

## 2019-06-25 DIAGNOSIS — D539 Nutritional anemia, unspecified: Secondary | ICD-10-CM | POA: Insufficient documentation

## 2019-06-25 DIAGNOSIS — E039 Hypothyroidism, unspecified: Secondary | ICD-10-CM | POA: Insufficient documentation

## 2019-06-25 DIAGNOSIS — R5383 Other fatigue: Secondary | ICD-10-CM | POA: Insufficient documentation

## 2019-06-25 NOTE — Progress Notes (Signed)
Patient has vertigo. She is scheduled to have hip surgery next Tuesday. She is having watery diarrhea.

## 2019-06-25 NOTE — Telephone Encounter (Signed)
Recommend holding aspirin only for 5 days.

## 2019-06-25 NOTE — Telephone Encounter (Signed)
   [facility] Pre-operative Risk Assessment    Request for surgical clearance:  1. What type of surgery is being performed? Right total hip arthroplasty   2. When is this surgery scheduled? 07/02/19  3. What type of clearance is required (medical clearance vs. Pharmacy clearance to hold med vs. Both)? both  4. Are there any medications that need to be held prior to surgery and how long? Instructions for aspirin - would like to stop 7 days prior, advise when to resume  5. Practice name and name of physician performing surgery? [facility], [facility], Dr Paralee Cancel  6. What is your office phone number [place]   7.   What is your office fax number [phone]  8.   Anesthesia type (None, local, MAC, general) ? Spinal    Ace Gins 06/25/2019, 3:40 PM  _________________________________________________________________   (provider comments below)

## 2019-06-26 ENCOUNTER — Encounter: Payer: Self-pay | Admitting: Hematology and Oncology

## 2019-06-26 ENCOUNTER — Inpatient Hospital Stay (HOSPITAL_BASED_OUTPATIENT_CLINIC_OR_DEPARTMENT_OTHER): Payer: Medicare Other | Admitting: Hematology and Oncology

## 2019-06-26 VITALS — BP 139/65 | HR 63 | Temp 97.1°F | Resp 18 | Wt 173.4 lb

## 2019-06-26 DIAGNOSIS — L97919 Non-pressure chronic ulcer of unspecified part of right lower leg with unspecified severity: Secondary | ICD-10-CM

## 2019-06-26 DIAGNOSIS — D539 Nutritional anemia, unspecified: Secondary | ICD-10-CM | POA: Diagnosis not present

## 2019-06-26 DIAGNOSIS — M1990 Unspecified osteoarthritis, unspecified site: Secondary | ICD-10-CM | POA: Diagnosis not present

## 2019-06-26 DIAGNOSIS — Z8673 Personal history of transient ischemic attack (TIA), and cerebral infarction without residual deficits: Secondary | ICD-10-CM | POA: Diagnosis not present

## 2019-06-26 DIAGNOSIS — M549 Dorsalgia, unspecified: Secondary | ICD-10-CM | POA: Diagnosis not present

## 2019-06-26 DIAGNOSIS — Z7189 Other specified counseling: Secondary | ICD-10-CM | POA: Diagnosis not present

## 2019-06-26 DIAGNOSIS — N289 Disorder of kidney and ureter, unspecified: Secondary | ICD-10-CM | POA: Diagnosis not present

## 2019-06-26 DIAGNOSIS — I252 Old myocardial infarction: Secondary | ICD-10-CM | POA: Diagnosis not present

## 2019-06-26 DIAGNOSIS — E1151 Type 2 diabetes mellitus with diabetic peripheral angiopathy without gangrene: Secondary | ICD-10-CM | POA: Diagnosis not present

## 2019-06-26 DIAGNOSIS — E039 Hypothyroidism, unspecified: Secondary | ICD-10-CM | POA: Diagnosis not present

## 2019-06-26 DIAGNOSIS — I130 Hypertensive heart and chronic kidney disease with heart failure and stage 1 through stage 4 chronic kidney disease, or unspecified chronic kidney disease: Secondary | ICD-10-CM | POA: Diagnosis not present

## 2019-06-26 DIAGNOSIS — R42 Dizziness and giddiness: Secondary | ICD-10-CM | POA: Diagnosis not present

## 2019-06-26 DIAGNOSIS — I2510 Atherosclerotic heart disease of native coronary artery without angina pectoris: Secondary | ICD-10-CM

## 2019-06-26 DIAGNOSIS — M1611 Unilateral primary osteoarthritis, right hip: Secondary | ICD-10-CM | POA: Diagnosis not present

## 2019-06-26 DIAGNOSIS — I83019 Varicose veins of right lower extremity with ulcer of unspecified site: Secondary | ICD-10-CM

## 2019-06-26 DIAGNOSIS — I509 Heart failure, unspecified: Secondary | ICD-10-CM | POA: Diagnosis not present

## 2019-06-26 DIAGNOSIS — M25559 Pain in unspecified hip: Secondary | ICD-10-CM | POA: Diagnosis not present

## 2019-06-26 DIAGNOSIS — C9000 Multiple myeloma not having achieved remission: Secondary | ICD-10-CM

## 2019-06-26 DIAGNOSIS — Z7982 Long term (current) use of aspirin: Secondary | ICD-10-CM | POA: Diagnosis not present

## 2019-06-26 DIAGNOSIS — E1122 Type 2 diabetes mellitus with diabetic chronic kidney disease: Secondary | ICD-10-CM | POA: Diagnosis not present

## 2019-06-26 DIAGNOSIS — R5383 Other fatigue: Secondary | ICD-10-CM | POA: Diagnosis not present

## 2019-06-26 DIAGNOSIS — E785 Hyperlipidemia, unspecified: Secondary | ICD-10-CM | POA: Diagnosis not present

## 2019-06-26 DIAGNOSIS — M810 Age-related osteoporosis without current pathological fracture: Secondary | ICD-10-CM | POA: Diagnosis not present

## 2019-06-26 DIAGNOSIS — J449 Chronic obstructive pulmonary disease, unspecified: Secondary | ICD-10-CM | POA: Diagnosis not present

## 2019-06-26 DIAGNOSIS — Z79899 Other long term (current) drug therapy: Secondary | ICD-10-CM | POA: Diagnosis not present

## 2019-06-26 DIAGNOSIS — I6782 Cerebral ischemia: Secondary | ICD-10-CM | POA: Diagnosis not present

## 2019-06-26 NOTE — Progress Notes (Signed)
Patient reports she had a near syncopy episode on Saturday, went to ER, and was informed she had Vertigo. Patient reports symptoms have resolved at this time. States watery diarrhea is a chronic issue, denies any changes. Patient is scheduled for total right hip replacement next Tuesday.

## 2019-06-26 NOTE — Patient Instructions (Signed)
Bortezomib injection What is this medicine? BORTEZOMIB (bor TEZ oh mib) is a medicine that targets proteins in cancer cells and stops the cancer cells from growing. It is used to treat multiple myeloma and mantle-cell lymphoma. This medicine may be used for other purposes; ask your health care provider or pharmacist if you have questions. COMMON BRAND NAME(S): Velcade What should I tell my health care provider before I take this medicine? They need to know if you have any of these conditions:  diabetes  heart disease  irregular heartbeat  liver disease  on hemodialysis  low blood counts, like low white blood cells, platelets, or hemoglobin  peripheral neuropathy  taking medicine for blood pressure  an unusual or allergic reaction to bortezomib, mannitol, boron, other medicines, foods, dyes, or preservatives  pregnant or trying to get pregnant  breast-feeding How should I use this medicine? This medicine is for injection into a vein or for injection under the skin. It is given by a health care professional in a hospital or clinic setting. Talk to your pediatrician regarding the use of this medicine in children. Special care may be needed. Overdosage: If you think you have taken too much of this medicine contact a poison control center or emergency room at once. NOTE: This medicine is only for you. Do not share this medicine with others. What if I miss a dose? It is important not to miss your dose. Call your doctor or health care professional if you are unable to keep an appointment. What may interact with this medicine? This medicine may interact with the following medications:  ketoconazole  rifampin  ritonavir  St. John's Wort This list may not describe all possible interactions. Give your health care provider a list of all the medicines, herbs, non-prescription drugs, or dietary supplements you use. Also tell them if you smoke, drink alcohol, or use illegal drugs. Some  items may interact with your medicine. What should I watch for while using this medicine? You may get drowsy or dizzy. Do not drive, use machinery, or do anything that needs mental alertness until you know how this medicine affects you. Do not stand or sit up quickly, especially if you are an older patient. This reduces the risk of dizzy or fainting spells. In some cases, you may be given additional medicines to help with side effects. Follow all directions for their use. Call your doctor or health care professional for advice if you get a fever, chills or sore throat, or other symptoms of a cold or flu. Do not treat yourself. This drug decreases your body's ability to fight infections. Try to avoid being around people who are sick. This medicine may increase your risk to bruise or bleed. Call your doctor or health care professional if you notice any unusual bleeding. You may need blood work done while you are taking this medicine. In some patients, this medicine may cause a serious brain infection that may cause death. If you have any problems seeing, thinking, speaking, walking, or standing, tell your doctor right away. If you cannot reach your doctor, urgently seek other source of medical care. Check with your doctor or health care professional if you get an attack of severe diarrhea, nausea and vomiting, or if you sweat a lot. The loss of too much body fluid can make it dangerous for you to take this medicine. Do not become pregnant while taking this medicine or for at least 7 months after stopping it. Women should inform their doctor   if they wish to become pregnant or think they might be pregnant. Men should not father a child while taking this medicine and for at least 4 months after stopping it. There is a potential for serious side effects to an unborn child. Talk to your health care professional or pharmacist for more information. Do not breast-feed an infant while taking this medicine or for 2  months after stopping it. This medicine may interfere with the ability to have a child. You should talk with your doctor or health care professional if you are concerned about your fertility. What side effects may I notice from receiving this medicine? Side effects that you should report to your doctor or health care professional as soon as possible:  allergic reactions like skin rash, itching or hives, swelling of the face, lips, or tongue  breathing problems  changes in hearing  changes in vision  fast, irregular heartbeat  feeling faint or lightheaded, falls  pain, tingling, numbness in the hands or feet  right upper belly pain  seizures  swelling of the ankles, feet, hands  unusual bleeding or bruising  unusually weak or tired  vomiting  yellowing of the eyes or skin Side effects that usually do not require medical attention (report to your doctor or health care professional if they continue or are bothersome):  changes in emotions or moods  constipation  diarrhea  loss of appetite  headache  irritation at site where injected  nausea This list may not describe all possible side effects. Call your doctor for medical advice about side effects. You may report side effects to FDA at 1-800-FDA-1088. Where should I keep my medicine? This drug is given in a hospital or clinic and will not be stored at home. NOTE: This sheet is a summary. It may not cover all possible information. If you have questions about this medicine, talk to your doctor, pharmacist, or health care provider.  2020 Elsevier/Gold Standard (2017-08-21 16:29:31)  Lenalidomide Oral Capsules What is this medicine? LENALIDOMIDE (len a LID oh mide) is a chemotherapy drug that targets specific proteins within cancer cells and stops the cancer cell from growing. It is used to treat multiple myeloma, certain types of lymphoma, and some myelodysplastic syndromes that cause severe anemia requiring blood  transfusions. This medicine may be used for other purposes; ask your health care provider or pharmacist if you have questions. COMMON BRAND NAME(S): Revlimid What should I tell my health care provider before I take this medicine? They need to know if you have any of these conditions:  blood clots in the legs or the lungs  high blood pressure  high cholesterol  infection  irregular monthly periods or menstrual cycles  kidney disease  liver disease  smoke tobacco  thyroid disease  an unusual or allergic reaction to lenalidomide, thalidomide, other medicines, foods, dyes, or preservatives  pregnant or trying to get pregnant  breast-feeding How should I use this medicine? Take this medicine by mouth with a glass of water. Follow the directions on the prescription label. Do not cut, crush, or chew this medicine. Take your medicine at regular intervals. Do not take it more often than directed. Do not stop taking except on your doctor's advice. A MedGuide will be given with each prescription and refill. Read this guide carefully each time. The MedGuide may change frequently. Talk to your pediatrician regarding the use of this medicine in children. Special care may be needed. Overdosage: If you think you have taken too much   of this medicine contact a poison control center or emergency room at once. NOTE: This medicine is only for you. Do not share this medicine with others. What if I miss a dose? If you miss a dose, take it as soon as you can. If your next dose is to be taken in less than 12 hours, then do not take the missed dose. Take the next dose at your regular time. Do not take double or extra doses. What may interact with this medicine? This medicine may interact with the following medications:  digoxin  medicines that increase the risk of thrombosis like estrogens or erythropoietic agents (e.g., epoetin alfa and darbepoetin alfa)  warfarin This list may not describe all  possible interactions. Give your health care provider a list of all the medicines, herbs, non-prescription drugs, or dietary supplements you use. Also tell them if you smoke, drink alcohol, or use illegal drugs. Some items may interact with your medicine. What should I watch for while using this medicine? You may need blood work done while you are taking this medicine. This medicine may cause serious skin reactions. They can happen weeks to months after starting the medicine. Contact your health care provider right away if you notice fevers or flu-like symptoms with a rash. The rash may be red or purple and then turn into blisters or peeling of the skin. Or, you might notice a red rash with swelling of the face, lips or lymph nodes in your neck or under your arms. This medicine is available only through a special program. Doctors, pharmacies, and patients must meet all of the conditions of the program. Your health care provider will help you get signed up with the program if you need this medicine. Through the program you will only receive up to a 28 day supply of the medicine at one time. You will need a new prescription for each refill. This medicine can cause birth defects. Do not get pregnant while taking this drug. Females with child-bearing potential will need to have 2 negative pregnancy tests before starting this medicine. Pregnancy testing must be done every 2 to 4 weeks as directed while taking this medicine. Use 2 reliable forms of birth control together while you are taking this medicine and for 4 weeks after you stop taking this medicine. If you think that you might be pregnant talk to your doctor right away. Do not breast-feed an infant while taking this medicine. Men must use a latex condom during sexual contact with a woman while taking this medicine and for 4 weeks after you stop taking this medicine. A latex condom is needed even if you have had a vasectomy. Contact your doctor right away if  your partner becomes pregnant. Do not donate sperm while taking this medicine and for 4 weeks after you stop taking this medicine. Do not give blood while taking the medicine and for 4 weeks after completion of treatment to avoid exposing pregnant women to the medicine through the donated blood. Talk to your doctor about your risk of cancer. You may be more at risk for certain types of cancers if you take this medicine. What side effects may I notice from receiving this medicine? Side effects that you should report to your doctor or health care professional as soon as possible:  allergic reactions like skin rash, itching or hives, swelling of the face, lips, or tongue  breathing problems  chest pain or tightness  fast, irregular heartbeat  feeling faint  low   blood counts - this medicine may decrease the number of white blood cells, red blood cells and platelets. You may be at increased risk for infections and bleeding.  rash, fever, and swollen lymph nodes  redness, blistering, peeling or loosening of the skin, including inside the mouth  seizures  signs and symptoms of bleeding such as bloody or black, tarry stools; red or dark-brown urine; spitting up blood or brown material that looks like coffee grounds; red spots on the skin; unusual bruising or bleeding from the eye, gums, or nose  signs and symptoms of a blood clot such as breathing problems; changes in vision; chest pain; severe, sudden headache; pain, swelling, warmth in the leg; trouble speaking; sudden numbness or weakness of the face, arm or leg  signs and symptoms of liver injury like dark yellow or brown urine; general ill feeling or flu-like symptoms; light-colored stools; loss of appetite; nausea; right upper belly pain; unusually weak or tired; yellowing of the eyes or skin  signs and symptoms of a stroke like changes in vision; confusion; trouble speaking or understanding; severe headaches; sudden numbness or weakness  of the face, arm or leg; trouble walking; dizziness; loss of balance or coordination  sweating  vomiting Side effects that usually do not require medical attention (report to your doctor or health care professional if they continue or are bothersome):  constipation  cough  diarrhea  joint pain  muscle cramps  swelling of the arms, legs, or skin  tiredness  trouble sleeping This list may not describe all possible side effects. Call your doctor for medical advice about side effects. You may report side effects to FDA at 1-800-FDA-1088. Where should I keep my medicine? Keep out of the reach of children. Store at room temperature between 15 and 30 degrees C (59 and 86 degrees F). Throw away any unused medicine after the expiration date. NOTE: This sheet is a summary. It may not cover all possible information. If you have questions about this medicine, talk to your doctor, pharmacist, or health care provider.  2020 Elsevier/Gold Standard (2018-07-13 15:09:17)   

## 2019-06-26 NOTE — Telephone Encounter (Signed)
   Primary Cardiologist: Kathlyn Sacramento, MD  Chart reviewed as part of pre-operative protocol coverage. Patient was contacted 06/26/2019 in reference to pre-operative risk assessment for pending surgery as outlined below.  Bailey Ayala was last seen on 05/07/2019 by Dr. Fletcher Anon.  Since that day, Bailey Ayala has done well without chest pain or shortness of breath.  Therefore, based on ACC/AHA guidelines, the patient would be at acceptable risk for the planned procedure without further cardiovascular testing.   I will route this recommendation to the requesting party via Epic fax function and remove from pre-op pool.  Please call with questions. Patient has been instructed to hold aspirin for 5 days prior to the surgery and restart as soon as possible after the surgery at the surgeon's discretion.  [place], [place] 06/26/2019, 10:26 AM

## 2019-06-26 NOTE — Progress Notes (Signed)
PCP - Guadalupe Maple  Cardiologist - Dr. Kathlyn Sacramento w/cardiac clearance from [facility]'s telephone encounter dated 06/24/19  Chest x-ray - 06-22-19  EKG - 06-22-19 Stress Test -  ECHO -  Cardiac Cath -   Sleep Study -  CPAP -   Fasting Blood Sugar -  Checks Blood Sugar _____ times a day  Blood Thinner Instructions:   Aspirin Instructions: Hold x 5 days per Dr. Rogue Jury (06-25-19 note) Last Dose:  Anesthesia review: Hx of CVA, AAA ( See 05-31-19 Doppler note in Epic), HTN  Patient denies shortness of breath, fever, cough and chest pain at PAT appointment   Patient verbalized understanding of instructions that were given to them at the PAT appointment. Patient was also instructed that they will need to review over the PAT instructions again at home before surgery.

## 2019-06-26 NOTE — Patient Instructions (Addendum)
DUE TO COVID-19 ONLY ONE VISITOR IS ALLOWED TO COME WITH YOU AND STAY IN THE WAITING ROOM ONLY DURING PRE OP AND PROCEDURE DAY OF SURGERY. THE 1 VISITOR MAY VISIT WITH YOU AFTER SURGERY IN YOUR PRIVATE ROOM DURING VISITING HOURS ONLY!  YOU NEED TO HAVE A COVID 19 TEST ON Friday, June 28, 2019 @ 3:00 PM, THIS TEST MUST BE DONE BEFORE SURGERY, COME  [address], [place] , [address].  ([place]) ONCE YOUR COVID TEST IS COMPLETED, PLEASE BEGIN THE QUARANTINE INSTRUCTIONS AS OUTLINED IN YOUR HANDOUT.                Bailey Ayala  06/26/2019   Your procedure is scheduled on: Tuesday, July 02, 2019   Report to [facility] Main  Entrance    Report to Admitting at 11:30 AM   Call this number if you have problems the morning of surgery [phone]    Do not eat solid foods after Midnight Monday   You may have a Clear Liquid Diet from Midnight until 11:00 AM. After 11:00 AM, nothing until after surgery.    CLEAR LIQUID DIET   Foods Allowed                                                                     Foods Excluded  Water, Black Coffee and tea, regular and decaf                             liquids that you cannot  Plain Jell-O any favor except red or purple                                           see through such as: Fruit ices (not with fruit pulp)                                     milk, soups, orange juice  Iced Popsicles                                    All solid food Carbonated beverages, regular and diet                                    Cranberry, grape and apple juices Sports drinks like Gatorade Lightly seasoned clear broth or consume(fat free) Sugar, honey syrup   Drink pre surgery Ensure at 11:00 AM morning of surgery   Take these medicines the morning of surgery with A SIP OF WATER: Levothryoxine (Synthroid), and Metoprolol Tartrate, Atorvastatin   BRUSH YOUR TEETH MORNING OF SURGERY AND RINSE YOUR MOUTH OUT, NO CHEWING GUM  CANDY OR MINTS.                                You  may not have any metal on your body including hair pins and              piercings     Do not wear jewelry, make-up, lotions, powders or perfumes, deodorant              Do not wear nail polish on your fingernails.  Do not shave  48 hours prior to surgery.                Do not bring valuables to the hospital. [place].   Contacts, dentures or bridgework may not be worn into surgery.  You may bring overnight bag               Please read over the following fact sheets you were given: _____________________________________________________________________ [facility] - Preparing for Surgery Before surgery, you can play an important role.  Because skin is not sterile, your skin needs to be as free of germs as possible.  You can reduce the number of germs on your skin by washing with CHG (chlorahexidine gluconate) soap before surgery.  CHG is an antiseptic cleaner which kills germs and bonds with the skin to continue killing germs even after washing. Please DO NOT use if you have an allergy to CHG or antibacterial soaps.  If your skin becomes reddened/irritated stop using the CHG and inform your nurse when you arrive at Short Stay. Do not shave (including legs and underarms) for at least 48 hours prior to the first CHG shower.  You may shave your face/neck. Please follow these instructions carefully:  1.  Shower with CHG Soap the night before surgery and the  morning of Surgery.  2.  If you choose to wash your hair, wash your hair first as usual with your  normal  shampoo.  3.  After you shampoo, rinse your hair and body thoroughly to remove the  shampoo.                             4.  Use CHG as you would any other liquid soap.  You can apply chg directly  to the skin and wash                       Gently with a scrungie or clean washcloth.  5.  Apply the CHG Soap to your body ONLY FROM THE  NECK DOWN.   Do not use on face/ open                           Wound or open sores. Avoid contact with eyes, ears mouth and genitals (private parts).                       Wash face,  Genitals (private parts) with your normal soap.             6.  Wash thoroughly, paying special attention to the area where your surgery  will be performed.  7.  Thoroughly rinse your body with warm water from the neck down.  8.  DO NOT shower/wash with your normal soap after using and rinsing off  the CHG Soap.  9.  Pat yourself dry with a clean towel.            10.  Wear clean pajamas.            11.  Place clean sheets on your bed the night of your first shower and do not  sleep with pets. Day of Surgery : Do not apply any lotions/deodorants the morning of surgery.  Please wear clean clothes to the hospital/surgery center.  FAILURE TO FOLLOW THESE INSTRUCTIONS MAY RESULT IN THE CANCELLATION OF YOUR SURGERY PATIENT SIGNATURE_________________________________  NURSE SIGNATURE__________________________________  ________________________________________________________________________   Adam Phenix  An incentive spirometer is a tool that can help keep your lungs clear and active. This tool measures how well you are filling your lungs with each breath. Taking long deep breaths may help reverse or decrease the chance of developing breathing (pulmonary) problems (especially infection) following:  A long period of time when you are unable to move or be active. BEFORE THE PROCEDURE   If the spirometer includes an indicator to show your best effort, your nurse or respiratory therapist will set it to a desired goal.  If possible, sit up straight or lean slightly forward. Try not to slouch.  Hold the incentive spirometer in an upright position. INSTRUCTIONS FOR USE  1. Sit on the edge of your bed if possible, or sit up as far as you can in bed or on a chair. 2. Hold the incentive  spirometer in an upright position. 3. Breathe out normally. 4. Place the mouthpiece in your mouth and seal your lips tightly around it. 5. Breathe in slowly and as deeply as possible, raising the piston or the ball toward the top of the column. 6. Hold your breath for 3-5 seconds or for as long as possible. Allow the piston or ball to fall to the bottom of the column. 7. Remove the mouthpiece from your mouth and breathe out normally. 8. Rest for a few seconds and repeat Steps 1 through 7 at least 10 times every 1-2 hours when you are awake. Take your time and take a few normal breaths between deep breaths. 9. The spirometer may include an indicator to show your best effort. Use the indicator as a goal to work toward during each repetition. 10. After each set of 10 deep breaths, practice coughing to be sure your lungs are clear. If you have an incision (the cut made at the time of surgery), support your incision when coughing by placing a pillow or rolled up towels firmly against it. Once you are able to get out of bed, walk around indoors and cough well. You may stop using the incentive spirometer when instructed by your caregiver.  RISKS AND COMPLICATIONS  Take your time so you do not get dizzy or light-headed.  If you are in pain, you may need to take or ask for pain medication before doing incentive spirometry. It is harder to take a deep breath if you are having pain. AFTER USE  Rest and breathe slowly and easily.  It can be helpful to keep track of a log of your progress. Your caregiver can provide you with a simple table to help with this. If you are using the spirometer at home, follow these instructions: [place] IF:   You are having difficultly using the spirometer.  You have trouble using the spirometer as often as instructed.  Your pain medication is not giving enough relief while using the spirometer.  You develop  fever of 100.5 F (38.1 C) or higher. SEEK  IMMEDIATE MEDICAL CARE IF:   You cough up bloody sputum that had not been present before.  You develop fever of 102 F (38.9 C) or greater.  You develop worsening pain at or near the incision site. MAKE SURE YOU:   Understand these instructions.  Will watch your condition.  Will get help right away if you are not doing well or get worse. Document Released: 08/22/2006 Document Revised: 07/04/2011 Document Reviewed: 10/23/2006 Exit[employer] Patient Information 2014 Exit[employer], [place].   ________________________________________________________________________  WHAT IS A BLOOD TRANSFUSION? Blood Transfusion Information  A transfusion is the replacement of blood or some of its parts. Blood is made up of multiple cells which provide different functions.  Red blood cells carry oxygen and are used for blood loss replacement.  White blood cells fight against infection.  Platelets control bleeding.  Plasma helps clot blood.  Other blood products are available for specialized needs, such as hemophilia or other clotting disorders. BEFORE THE TRANSFUSION  Who gives blood for transfusions?   Healthy volunteers who are fully evaluated to make sure their blood is safe. This is blood bank blood. Transfusion therapy is the safest it has ever been in the practice of medicine. Before blood is taken from a donor, a complete history is taken to make sure that person has no history of diseases nor engages in risky social behavior (examples are intravenous drug use or sexual activity with multiple partners). The donor's travel history is screened to minimize risk of transmitting infections, such as malaria. The donated blood is tested for signs of infectious diseases, such as HIV and hepatitis. The blood is then tested to be sure it is compatible with you in order to minimize the chance of a transfusion reaction. If you or a relative donates blood, this is often done in anticipation of surgery and is not  appropriate for emergency situations. It takes many days to process the donated blood. RISKS AND COMPLICATIONS Although transfusion therapy is very safe and saves many lives, the main dangers of transfusion include:   Getting an infectious disease.  Developing a transfusion reaction. This is an allergic reaction to something in the blood you were given. Every precaution is taken to prevent this. The decision to have a blood transfusion has been considered carefully by your caregiver before blood is given. Blood is not given unless the benefits outweigh the risks. AFTER THE TRANSFUSION  Right after receiving a blood transfusion, you will usually feel much better and more energetic. This is especially true if your red blood cells have gotten low (anemic). The transfusion raises the level of the red blood cells which carry oxygen, and this usually causes an energy increase.  The nurse administering the transfusion will monitor you carefully for complications. HOME CARE INSTRUCTIONS  No special instructions are needed after a transfusion. You may find your energy is better. Speak with your caregiver about any limitations on activity for underlying diseases you may have. SEEK MEDICAL CARE IF:   Your condition is not improving after your transfusion.  You develop redness or irritation at the intravenous (IV) site. SEEK IMMEDIATE MEDICAL CARE IF:  Any of the following symptoms occur over the next 12 hours:  Shaking chills.  You have a temperature by mouth above 102 F (38.9 C), not controlled by medicine.  Chest, back, or muscle pain.  People around you feel you are not acting correctly or are confused.  Shortness of breath  or difficulty breathing.  Dizziness and fainting.  You get a rash or develop hives.  You have a decrease in urine output.  Your urine turns a dark color or changes to pink, red, or brown. Any of the following symptoms occur over the next 10 days:  You have a  temperature by mouth above 102 F (38.9 C), not controlled by medicine.  Shortness of breath.  Weakness after normal activity.  The white part of the eye turns yellow (jaundice).  You have a decrease in the amount of urine or are urinating less often.  Your urine turns a dark color or changes to pink, red, or brown. Document Released: 04/08/2000 Document Revised: 07/04/2011 Document Reviewed: 11/26/2007 [facility] Patient Information 2014 [place], [place].  _______________________________________________________________________

## 2019-06-27 ENCOUNTER — Encounter (HOSPITAL_COMMUNITY): Payer: Self-pay

## 2019-06-27 ENCOUNTER — Other Ambulatory Visit: Payer: Self-pay

## 2019-06-27 ENCOUNTER — Inpatient Hospital Stay (HOSPITAL_BASED_OUTPATIENT_CLINIC_OR_DEPARTMENT_OTHER): Payer: Medicare Other | Admitting: Nurse Practitioner

## 2019-06-27 ENCOUNTER — Inpatient Hospital Stay: Payer: Medicare Other

## 2019-06-27 DIAGNOSIS — C9000 Multiple myeloma not having achieved remission: Secondary | ICD-10-CM | POA: Diagnosis not present

## 2019-06-27 DIAGNOSIS — D472 Monoclonal gammopathy: Secondary | ICD-10-CM

## 2019-06-27 NOTE — Progress Notes (Signed)
Virtual Visit Progress Note  [place] [facility]  Telephone:([phone] [phone]  Patient Care Team: Guadalupe Maple, MD as PCP - General (Family Medicine) Wellington Hampshire, MD as PCP - Cardiology (Cardiology) Christene Lye, MD (General Surgery) Guadalupe Maple, MD (Family Medicine) Birder Robson, MD as Referring Physician (Ophthalmology) Vladimir Crofts, MD (Neurology) Anthonette Legato, MD (Internal Medicine) Wellington Hampshire, MD as Consulting Physician (Cardiology) Paralee Cancel, MD as Consulting Physician (Orthopedic Surgery)   Name of the patient: Bailey Ayala  [id number]  [birth year]   Date of visit: 06/27/19  I connected with Bailey Ayala on 06/27/19 at  2:00 PM EST by telephone visit and verified that I am speaking with the correct person using two identifiers.   I discussed the limitations, risks, security and privacy concerns of performing an evaluation and management service by telemedicine and the availability of in-person appointments. I also discussed with the patient that there may be a patient responsible charge related to this service. The patient expressed understanding and agreed to proceed.   Other persons participating in the visit and their role in the encounter: Magdalene Patricia, Therapist, sports (Nurse Navigator & Chemo Education)  Patient's location: home Provider's location: clinic  Diagnosis- Smoldering Multiple Myeloma  Chief complaint/Reason for visit- Initial Meeting for [facility], preparing for starting chemotherapy  Heme/Onc history:  Bailey Ayala is a 79 y.o. female with smoldering multiple myeloma. SPEPon 06/21/2017 revealed a 1.2 gm/dL monoclonal spike. Random urine revealed 4.6 mg/dL protein and a 40.6% M-spike. Calcium, albumin, and serum protein were normal.  Work-up on 03/08/2019revealed a hematocrit of 31.1, hemoglobin 10.4, MCV 97.4, platelets 227,000, WBC 5700  with an ANC of 3300. SPEPrevealed a 1.1 gm/dL IgG monoclonal protein with lambda light chain specificity and monoclonal free lambda light chains(Bence-Jones protein). IgG was 1660 [phone]) and IgA was 48 (64-422). Kappa free light chains were 23.7, lambda free light chains were 142.2 and ratio 0.17 (0.26-1.65). Beta2-microglobulin was 4.7 (0.6-2.4). Normal studies included: B12, folate, and ferritin (75). TSHwas 0.204 (low). 24 hour UPEPrevealed kappa free light chains 28.80 mg/L, lambda free light chains 63.80 mg/L and ration 0.45 (2.04 - 10.37). M spike was 27.4% (16 mg/24 hours).  Bone surveyon 06/30/2017 revealed no suspicious focal bony lesions.  Bone marrow aspirate and biopsyon 07/26/2017 revealed a slightly hypercellular marrow for age with trilineage hematopoiesis. There was 13% plasmacytosis(10-15% byCD138IHC). Plasma cells were ininterstitial cells and small clusters. Lambda light chains appeared restricted. Features were felt compatible with plasma cell neoplasm.  Bone marrow aspirate and biopsy on 05/29/2019 revealed a normocellularmarrow for age (20-30%) withincreased monoclonal plasma cells (15% aspirate, 20-30% CD138by IHC).Plasma cells were lambda restricted.There were no myelodysplastic changes.Storage iron was present. Flow cytometry revealed no monoclonal B-cell or phenotypically aberrant T-cell population. Findings were consistent with persistent plasma cell myeloma.Peripheral bloodrevealed amacrocytic anemia with rouleaux formation.Cytogenetics were normal (65, XX). FISH for MDSrevealed gain of KMT2A (MLL) or chromosome 11/11q (110F.5.0%, normal < 3.1%).FISH for myeloma revealed a deletion of NF1 or chromosome 17q11 (2R1G, 89%, not seen in validation studies).  PET scanon 08/14/2017 revealed no hypermetabolic lesions of myeloma. There was a 3.6 cm infrarenal aortic aneurysm. Ultrasound in 2 years was recommended. She had a 4 mm LLL  noduletoo small to characterize.  SPEPhas been followed: 1.1 on 06/30/2017, 1.2 on 11/14/2017, 1.3 on 01/26/2018, 1.2 on 05/10/2018, 1.4 on 08/08/2018, 1.2 on 11/09/2018, 1.4 on 02/11/2019, and 1.6 on 05/09/2019. IgGhas been  followed: 1660 on 06/30/2017, 1684 on 11/14/2017, 1795 on 01/26/2018, and 2054 on 08/08/2018.  Lambda free light chainshave been followed:142.2 (ratio 0.17)on03/11/2017, 214.4(ratio 0.09)on01/16/2020, 209.3(ratio 0.08)on04/14/2020, 220.2(ratio 0.07)on07/17/2020, 228.9(ratio 0.08)on 02/11/2019,and 258.4 (ratio 0.07)on01/14/2021.  Chest CTon 01/24/2018 revealed persistent LLL nodule. Area calcified and felt to be consistent with a granuloma. There were no other suspicious pulmonary nodules. There was new Schmorl's node formation involving the inferior endplate of O67. There were stable small scattered osseous lesions related to underlying multiple myeloma.  PET scanon 05/15/2018 revealed nohypermetabolic lesions characteristic of active myeloma. There was stable hypermetabolic apical wall of the left ventricle, there waspotentially a small pseudoaneurysmin this vicinity, but no appreciable mass was noted on 01/24/2018. There was aninfrarenal abdominal aortic aneurysm, 3.6 cm in diameter. Follow-up ultrasound in 2 years was recommended. Other imaging findings of potential clinical significance: aortic atherosclerosis and coronary atherosclerosis.  Bone survery on01/25/2021 showed numerous new lytic lesions throughout the skeleton including the posterior calvarium, bilateral humeri, bilateral iliac bones, left inferior pubic ramus, bilateral femurs.  She has stage III chronic kidney disease(Cr 2.25 on 05/27/2019).   She has a macrocytic anemia. B12, folate, and TSH were normal on 05/10/2018. Retic was 1.6%. Ferritin and iron studies were normal on 05/10/2018. Appetiteremains poor. Last colonoscopywas >10 years ago (she declines  repeat).  She has a 75 pack year smoking history. She has COPD. Chest CT without contraston 07/25/2017 revealed no suspicious pulmonary nodules. Oncology History   No history exists.    Interval history-  Bailey Ayala, 79 year old female, diagnosed with smoldering multiple myeloma, now planning to start treatment with velcade, revlimid, and decadron who presents to chemo care clinic today for initial meeting in preparation for starting chemotherapy. I introduced the chemo care clinic and we discussed that the role of the clinic is to assist those who are at an increased risk of emergency room visits and/or complications during the course of chemotherapy treatment. We discussed that the increased risk takes into account factors such as age, performance status, and co-morbidities. We also discussed that for some, this might include barriers to care such as not having a primary care provider, lack of insurance/transportation, or not being able to afford medications. We discussed that the goal of the program is to help prevent unplanned ER visits and help reduce complications during chemotherapy. We do this by discussing specific risk factors to each individual and identifying ways that we can help improve these risk factors and reduce barriers to care.  We discussed that she was identified as high risk of ER utilization of hospitalization based on prior ER and hospitalizations, Medicare status, anemia, CVD, CKD, COPD, CHF, DM, and PVD history.  She says that she has ongoing problems with vertigo and is generally weak.  She is scheduled to have hip surgery next Tuesday which she hopes will help.  ECOG FS: 2-3  Review of systems- Review of Systems  Constitutional: Positive for malaise/fatigue. Negative for chills, fever and weight loss.  HENT: Negative for hearing loss, nosebleeds, sore throat and tinnitus.   Eyes: Negative for blurred vision and double vision.  Respiratory: Positive for shortness  of breath (w/ exertion; chronic). Negative for cough, hemoptysis and wheezing.   Cardiovascular: Negative for chest pain, palpitations and leg swelling.  Gastrointestinal: Positive for diarrhea. Negative for abdominal pain, blood in stool, constipation, melena, nausea and vomiting.  Genitourinary: Negative for dysuria and urgency.  Musculoskeletal: Positive for joint pain. Negative for back pain, falls and myalgias.  Skin: Negative  for itching and rash.  Neurological: Positive for dizziness and weakness. Negative for tingling, sensory change, loss of consciousness and headaches.  Endo/Heme/Allergies: Negative for environmental allergies. Bruises/bleeds easily.  Psychiatric/Behavioral: Negative for depression. The patient is not nervous/anxious and does not have insomnia.      Allergies  Allergen Reactions  . Codeine Other (See Comments)    hallucinations    . Simvastatin Diarrhea and Nausea Only    Past Medical History:  Diagnosis Date  . AAA (abdominal aortic aneurysm) ([place])    The largest aortic measurement is 4.0 cm on 05/31/19  . Allergy 1980  . Anemia 09-2017  . Arthritis   . Bleeding from varicose vein   . Blood dyscrasia    BLEEDS EASILY   . Blood transfusion without reported diagnosis 2011 or 12  . Cataract ?  . CHF (congestive heart failure) ([place]) ?  . CKD (chronic kidney disease), stage III    Moderate  . Clotting disorder ([place]) ?  Marland Kitchen COPD (chronic obstructive pulmonary disease) ([place])   . Coronary artery disease   . Diabetes mellitus without complication ([place])   . Diffuse cystic mastopathy   . Emphysema of lung ([place]) ?  Marland Kitchen Hyperlipidemia   . Hypertension   . Hypothyroidism   . Incomplete left bundle branch block (LBBB) 06/22/2019   Noted on EKG  . L4-L5 disc bulge   . Multiple myeloma ([place])   . Myocardial infarction ([place]) 2019  . Osteoporosis, post-menopausal   . PAD (peripheral artery disease) ([place])   . Renal insufficiency   . Shortness of breath dyspnea     WITH EXERTION   . Stroke ([place])    TIA     2016     WAS FOUND ON SCAN ORDERED FROM NEUROL  . Thyroid disease   . TIA (transient ischemic attack) 2016   left hand weakness  . Vertigo     Past Surgical History:  Procedure Laterality Date  . ABDOMINAL HYSTERECTOMY  1990  . BACK SURGERY  2011  . BREAST EXCISIONAL BIOPSY Left    2010?   Marland Kitchen BREAST SURGERY  1975   A lump removed left breast  . CATARACT EXTRACTION, BILATERAL    . COLONOSCOPY  1998  . EYE SURGERY Bilateral 05/06/14  . JOINT REPLACEMENT     LT KNEE  . KNEE SURGERY Left 2012  . LEFT HEART CATH AND CORONARY ANGIOGRAPHY Left 11/19/2018   Procedure: LEFT HEART CATH AND CORONARY ANGIOGRAPHY;  Surgeon: Wellington Hampshire, MD;  Location: [facility];  Service: Cardiovascular;  Laterality: Left;  . OOPHORECTOMY  1990  . parathyroid transplant     2/3 THYROID REMOVED   ([place])  . SPINE SURGERY    . THYROID SURGERY    . TONSILLECTOMY    . TOTAL SHOULDER ARTHROPLASTY Left 01/14/2016   Procedure: LEFT TOTAL SHOULDER ARTHROPLASTY;  Surgeon: Justice Britain, MD;  Location: [facility];  Service: Orthopedics;  Laterality: Left;  . TOTAL SHOULDER ARTHROPLASTY Right 11/03/2016   Procedure: RIGHT TOTAL SHOULDER ARTHROPLASTY;  Surgeon: Justice Britain, MD;  Location: [facility];  Service: Orthopedics;  Laterality: Right;  . ULNAR NERVE REPAIR     LEFT  . vein closure procedure Left 2010    Social History   Socioeconomic History  . Marital status: Widowed    Spouse name: Not on file  . Number of children: Not on file  . Years of education: Not on file  . Highest education level: High school graduate  Occupational History  . Not on file  Tobacco Use  . Smoking status: Former Smoker    Packs/day: 3.00    Years: 25.00    Pack years: 75.00    Types: Cigarettes    Quit date: 11/03/1989    Years since quitting: 29.6  . Smokeless tobacco: Never Used  Substance and Sexual Activity  . Alcohol use: Not Currently    Alcohol/week: 0.0 standard  drinks  . Drug use: No  . Sexual activity: Not Currently    Birth control/protection: None  Other Topics Concern  . Not on file  Social History Narrative  . Not on file   Social Determinants of Health   Financial Resource Strain:   . Difficulty of Paying Living Expenses: Not on file  Food Insecurity:   . Worried About Charity fundraiser in the Last Year: Not on file  . Ran Out of Food in the Last Year: Not on file  Transportation Needs:   . Lack of Transportation (Medical): Not on file  . Lack of Transportation (Non-Medical): Not on file  Physical Activity:   . Days of Exercise per Week: Not on file  . Minutes of Exercise per Session: Not on file  Stress:   . Feeling of Stress : Not on file  Social Connections:   . Frequency of Communication with Friends and Family: Not on file  . Frequency of Social Gatherings with Friends and Family: Not on file  . Attends Religious Services: Not on file  . Active Member of Clubs or Organizations: Not on file  . Attends Archivist Meetings: Not on file  . Marital Status: Not on file  Intimate Partner Violence:   . Fear of Current or Ex-Partner: Not on file  . Emotionally Abused: Not on file  . Physically Abused: Not on file  . Sexually Abused: Not on file    Family History  Problem Relation Age of Onset  . Heart disease Mother        MI  . Arthritis Mother   . Stroke Mother   . Hyperlipidemia Mother   . Hypertension Mother   . Varicose Veins Mother   . Heart disease Father        MI  . Hyperlipidemia Father   . Arthritis Father   . Cancer Father   . COPD Father   . Hearing loss Father   . Hypertension Father   . Vision loss Father   . Diabetes Sister   . COPD Sister   . Heart disease Sister   . Hyperlipidemia Sister   . Hypertension Sister   . Hyperlipidemia Brother   . COPD Brother   . Diabetes Brother   . Heart disease Brother   . Hypertension Brother   . Arthritis Sister   . Hyperlipidemia Sister   .  Hypertension Sister   . Vision loss Sister   . Varicose Veins Sister   . Arthritis Brother   . Varicose Veins Brother   . Arthritis Daughter   . Arthritis Daughter   . Diabetes Daughter   . Cancer Paternal Aunt   . COPD Sister   . Heart disease Sister   . Hyperlipidemia Sister   . Hypertension Sister   . Varicose Veins Sister   . Heart disease Maternal Uncle   . Heart disease Paternal Uncle      Current Outpatient Medications:  .  acetaminophen (TYLENOL) 500 MG tablet, Take 500-1,000 mg by mouth every 6 (six) hours  as needed (for pain.). , Disp: , Rfl:  .  aspirin EC 81 MG tablet, Take 1 tablet (81 mg total) by mouth daily., Disp: 90 tablet, Rfl: 3 .  atorvastatin (LIPITOR) 40 MG tablet, Take 1 tablet (40 mg total) by mouth daily., Disp: 90 tablet, Rfl: 3 .  benazepril (LOTENSIN) 20 MG tablet, Take 1 tablet (20 mg total) by mouth daily., Disp: 90 tablet, Rfl: 4 .  ferrous sulfate 325 (65 FE) MG tablet, Take 325 mg by mouth daily with breakfast., Disp: , Rfl:  .  levothyroxine (SYNTHROID, LEVOTHROID) 100 MCG tablet, Take 1 tablet (100 mcg total) by mouth daily., Disp: 90 tablet, Rfl: 4 .  meclizine (ANTIVERT) 25 MG tablet, Take 1 tablet (25 mg total) by mouth 2 (two) times daily as needed for dizziness., Disp: 15 tablet, Rfl: 0 .  metoprolol tartrate (LOPRESSOR) 50 MG tablet, Take 1 tablet (50 mg total) by mouth 2 (two) times daily., Disp: 60 tablet, Rfl: 11 .  Multiple Vitamin (MULTIVITAMIN WITH MINERALS) TABS tablet, Take 1 tablet by mouth daily., Disp: , Rfl:  .  traMADol (ULTRAM) 50 MG tablet, Take 50 mg by mouth at bedtime as needed., Disp: , Rfl:  .  triamcinolone cream (KENALOG) 0.1 %, Apply 1 application topically 2 (two) times daily. (Patient taking differently: Apply 1 application topically 2 (two) times daily as needed (skin irritation/rash.). ), Disp: 30 g, Rfl: 0  Physical exam:  Exam limited d/t telemedicine  CMP Latest Ref Rng & Units 06/22/2019  Glucose 70 - 99 mg/dL  137(H)  BUN 8 - 23 mg/dL 23  Creatinine 0.44 - 1.00 mg/dL 1.60(H)  Sodium 135 - 145 mmol/L 135  Potassium 3.5 - 5.1 mmol/L 3.4(L)  Chloride 98 - 111 mmol/L 105  CO2 22 - 32 mmol/L 19(L)  Calcium 8.9 - 10.3 mg/dL 9.2  Total Protein 6.5 - 8.1 g/dL 8.6(H)  Total Bilirubin 0.3 - 1.2 mg/dL 0.6  Alkaline Phos 38 - 126 U/L 68  AST 15 - 41 U/L 24  ALT 0 - 44 U/L 16   CBC Latest Ref Rng & Units 06/22/2019  WBC 4.0 - 10.5 K/uL 7.2  Hemoglobin 12.0 - 15.0 g/dL 9.2(L)  Hematocrit 36.0 - 46.0 % 28.7(L)  Platelets 150 - 400 K/uL 203    No images are attached to the encounter.  DG Chest 1 View  Result Date: 06/22/2019 CLINICAL DATA:  79 year old female with syncope and nausea and vomiting. EXAM: CHEST  1 VIEW COMPARISON:  Chest radiograph dated 09/27/2010 and CT dated 01/29/2018. FINDINGS: Background of emphysema and interstitial coarsening. No focal consolidation, pleural effusion, pneumothorax. The cardiac silhouette is within normal limits. Atherosclerotic calcification of the aorta. Degenerative changes of the spine. Partially visualized lumbar fixation hardware and bilateral shoulder arthroplasty. Surgical clips over the upper mediastinum. No acute osseous pathology. IMPRESSION: 1. No acute cardiopulmonary process. 2. Emphysema. Electronically Signed   By: Anner Crete M.D.   On: 06/22/2019 20:41   CT Head Wo Contrast  Result Date: 06/22/2019 CLINICAL DATA:  Dizziness, vomiting EXAM: CT HEAD WITHOUT CONTRAST TECHNIQUE: Contiguous axial images were obtained from the base of the skull through the vertex without intravenous contrast. COMPARISON:  A 520 FINDINGS: Brain: There are chronic small vessel ischemic changes scattered throughout the periventricular white matter, with a chronic lacunar infarct in the right centrum semiovale. No acute infarct or hemorrhage. Lateral ventricles and remaining midline structures are unremarkable. No acute extra-axial fluid collections. No mass effect. Vascular:  No hyperdense vessel  or unexpected calcification. Skull: Normal. Negative for fracture or focal lesion. Sinuses/Orbits: No acute finding. Other: None IMPRESSION: 1. No acute intracranial pathology. 2. Chronic small vessel ischemic changes. Electronically Signed   By: Randa Ngo M.D.   On: 06/22/2019 21:35   MR BRAIN WO CONTRAST  Result Date: 06/22/2019 CLINICAL DATA:  Sudden onset of weakness, dizziness, nausea and vomiting. EXAM: MRI HEAD WITHOUT CONTRAST TECHNIQUE: Multiplanar, multiecho pulse sequences of the brain and surrounding structures were obtained without intravenous contrast. COMPARISON:  Head CT 06/22/2019.  MRI 11/28/2018. FINDINGS: Brain: Diffusion imaging does not show any acute or subacute infarction. Abnormality is seen affecting the brainstem or cerebellum. Cerebral hemispheres show chronic small-vessel ischemic changes of the basal ganglia and right caudate body. No cortical or large vessel territory infarction. No mass lesion, hemorrhage, hydrocephalus or extra-axial collection. Vascular: Major vessels at the base of the brain show flow. Skull and upper cervical spine: Negative Sinuses/Orbits: Clear/normal Other: None IMPRESSION: No acute finding. Chronic small-vessel ischemic changes of the cerebral hemispheres as outlined above, unchanged since 11/28/2018. Electronically Signed   By: Nelson Chimes M.D.   On: 06/22/2019 23:21   CT BONE MARROW BIOPSY & ASPIRATION  Result Date: 05/29/2019 INDICATION: 79 year old with smoldering multiple myeloma. EXAM: CT GUIDED BONE MARROW ASPIRATES AND BIOPSY Physician: Stephan Minister. Anselm Pancoast, MD MEDICATIONS: None. ANESTHESIA/SEDATION: Fentanyl 37.5 mcg IV; Versed 1.5 mg IV Moderate Sedation Time:  18 minutes The patient was continuously monitored during the procedure by the interventional radiology nurse under my direct supervision. COMPLICATIONS: None immediate. PROCEDURE: The procedure was explained to the patient. The risks and benefits of the procedure were  discussed and the patient's questions were addressed. Informed consent was obtained from the patient. The patient was placed on her left side. Images of the pelvis were obtained. The back was prepped and draped in sterile fashion. The skin and left posterior ilium were anesthetized with 1% lidocaine. 11 gauge bone needle was directed into the left ilium with CT guidance. Two aspirates and two core biopsies were obtained. Bandage placed over the puncture site. IMPRESSION: CT guided bone marrow aspiration and core biopsy. Electronically Signed   By: Markus Daft M.D.   On: 05/29/2019 12:09   VAS [place] AAA DUPLEX  Result Date: 05/31/2019 ABDOMINAL AORTA STUDY Indications: Follow up exam for known AAA. Infrarenal AAA seen on 05/15/18 PET              scan, measured 3.6cm Risk Factors: Hypertension, hyperlipidemia, coronary artery disease. Other Factors: No unexplained abdominal or back pain.  Comparison Study: No previous aorta duplex exam on record. Performing Technologist: Pilar Jarvis RDMS, RVT, RDCS  Examination Guidelines: A complete evaluation includes B-mode imaging, spectral Doppler, color Doppler, and power Doppler as needed of all accessible portions of each vessel. Bilateral testing is considered an integral part of a complete examination. Limited examinations for reoccurring indications may be performed as noted.  Abdominal Aorta Findings: +-------------+-------+----------+----------+----------+--------+--------+ Location     AP (cm)Trans (cm)PSV (cm/s)Waveform  ThrombusComments +-------------+-------+----------+----------+----------+--------+--------+ Proximal     2.10   2.00      64        biphasic                   +-------------+-------+----------+----------+----------+--------+--------+ Mid          2.10   2.10      60        biphasic                   +-------------+-------+----------+----------+----------+--------+--------+  Distal       3.80   4.00      43        biphasic   Present saccular +-------------+-------+----------+----------+----------+--------+--------+ RT CIA Prox  1.3    1.4       56        monophasic                 +-------------+-------+----------+----------+----------+--------+--------+ RT CIA Mid                    136       biphasic                   +-------------+-------+----------+----------+----------+--------+--------+ RT CIA Distal                 174       biphasic                   +-------------+-------+----------+----------+----------+--------+--------+ RT EIA Prox                   153                                  +-------------+-------+----------+----------+----------+--------+--------+ RT EIA Mid   1.0    1.1       156       biphasic                   +-------------+-------+----------+----------+----------+--------+--------+ RT EIA Distal                 139                                  +-------------+-------+----------+----------+----------+--------+--------+ LT CIA Prox  1.0    1.2       74        biphasic                   +-------------+-------+----------+----------+----------+--------+--------+ LT CIA Mid                    112                                  +-------------+-------+----------+----------+----------+--------+--------+ LT CIA Distal                 72                                   +-------------+-------+----------+----------+----------+--------+--------+ LT EIA Prox                   284       biphasic                   +-------------+-------+----------+----------+----------+--------+--------+ LT EIA Mid   0.9    1.0       282       monophasic                 +-------------+-------+----------+----------+----------+--------+--------+ LT EIA Distal                 247       biphasic                   +-------------+-------+----------+----------+----------+--------+--------+  The left external iliac artery appears to contain  calcified plaque throughout its length and color Dopper demonstrated turbulent flow throughout. IVC/Iliac Findings: +-------+------+--------+--------+   IVC  PatentThrombusComments +-------+------+--------+--------+ IVC Midpatent                 +-------+------+--------+--------+    Summary: Abdominal Aorta: There is evidence of abnormal dilatation of the distal Abdominal aorta. The largest aortic measurement is 4.0 cm. The largest aortic diameter has increased compared to prior exam. Previous diameter measurement was 3.6 cm obtained on 05/15/18 by PET scan. Stenosis: +-------------------+-------------+-------------+ Location           Stenosis     Comments      +-------------------+-------------+-------------+ Left External Iliac>50% stenosisEntire length +-------------------+-------------+-------------+   *See table(s) above for measurements and observations. Suggest follow up study in 12 months.  Electronically signed by Larae Grooms MD on 05/31/2019 at 5:55:28 PM.    Final      Assessment and plan- Patient is a 79 y.o. 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 Smoldering Myeloma-Case was discussed at hematology conference on 06/24/2019 with recommendation for initiation of therapy for smoldering multiple myeloma after planned hip surgery later this month.  Bone survey revealed numerous new lytic lesions.  M spike was 1.6 on 05/09/2019.  Symptomatic of fatigue.  Based on crab criteria, patient has renal dysfunction, anemia, and lytic bone lesions with normal calcium level.  Not a transplant candidate given her age and comorbidities.  Dr. Kem Parkinson medical oncologist and plans for Velcade, Revlimid, and Decadron. Risks vs benefits and possible side effects and late effects of treatment discussed.   2. Chemo Care Clinic/High Risk for ER/Hospitalization during chemotherapy- We  discussed the role of the chemo care clinic and identified patient specific risk factors. I discussed that patient was identified as high risk primarily based on: Previous hospitalization and ER utilization, Medicare status, history of anemia, history of CVD, history of CKD, history of COPD, history of CHF, history of diabetes, history of PVD.  She has a primary care provider who manages the majority of her chronic comorbidities.  3. Social Determinants of Health- we discussed that social determinants of health may have significant impacts on health and outcomes for cancer patients.  Today we discussed specific social determinants of performance status, alcohol use, depression, financial needs, food insecurity, housing, interpersonal violence, social connections, stress, tobacco use, and transportation.  After lengthy discussion she declined specific needs at this time.  4.  Back and hip pain-patient has known osteoarthritis particularly of the right hip and is significantly impairing quality of life and mobility.  Combined with vertigo she has had falls and general debility.  She is currently awaiting right hip replacement and treatment on hold until after that time.  5. Palliative Care- based on stage of cancer and/or identified needs today, I would recommend referral to palliative care for goals of care and advanced care planning.  Patient may also benefit from ongoing symptom management.  Okay to hold off on referral at this time given upcoming surgery but would recommend revisiting in the future.  Follow-up with medical oncology.   We also discussed the role of the Symptom Management Clinic at [facility] for acute issues and methods of contacting clinic/provider. She denies needing specific assistance at this time.  Follow-up with Dr. Mike Gip as scheduled  Visit Diagnosis 1. Smoldering multiple myeloma ([place])    I  discussed the assessment and treatment plan with the patient. The patient was provided an  opportunity to ask questions and all were answered. The patient agreed with the plan and demonstrated an understanding of the instructions.   The patient was advised to call back or seek an in-person evaluation if the symptoms worsen or if the condition fails to improve as anticipated.   I provided 13 minutes of non face-to-face telephone visit time during this encounter, and > 50% was spent counseling as documented under my assessment & plan.  Beckey Rutter, DNP, AGNP-C [place] at [facility] [phone] (clinic)

## 2019-06-27 NOTE — Progress Notes (Signed)
Spoke with Judeen Hammans to request orders be placed in epic.

## 2019-06-28 ENCOUNTER — Encounter (HOSPITAL_COMMUNITY): Payer: Self-pay

## 2019-06-28 ENCOUNTER — Other Ambulatory Visit (HOSPITAL_COMMUNITY)
Admission: RE | Admit: 2019-06-28 | Discharge: 2019-06-28 | Disposition: A | Discharge status: Home / Self Care | Payer: Medicare Other | Source: Ambulatory Visit | Attending: Orthopedic Surgery | Admitting: Orthopedic Surgery

## 2019-06-28 ENCOUNTER — Encounter (HOSPITAL_COMMUNITY)
Admission: RE | Admit: 2019-06-28 | Discharge: 2019-06-28 | Disposition: A | Discharge status: Home / Self Care | Payer: Medicare Other | Source: Ambulatory Visit | Attending: Orthopedic Surgery | Admitting: Orthopedic Surgery

## 2019-06-28 ENCOUNTER — Other Ambulatory Visit: Payer: Self-pay

## 2019-06-28 DIAGNOSIS — Z01812 Encounter for preprocedural laboratory examination: Secondary | ICD-10-CM | POA: Diagnosis not present

## 2019-06-28 DIAGNOSIS — Z20822 Contact with and (suspected) exposure to covid-19: Secondary | ICD-10-CM | POA: Diagnosis not present

## 2019-06-28 HISTORY — DX: Atherosclerotic heart disease of native coronary artery without angina pectoris: I25.10

## 2019-06-28 HISTORY — DX: Heartburn: R12

## 2019-06-28 HISTORY — DX: Varicose veins of unspecified lower extremity with other complications: I83.899

## 2019-06-28 HISTORY — DX: Lesion of ulnar nerve, left upper limb: G56.22

## 2019-06-28 HISTORY — DX: Abdominal aortic aneurysm, without rupture: I71.4

## 2019-06-28 HISTORY — DX: Chronic kidney disease, stage 3 unspecified: N18.30

## 2019-06-28 HISTORY — DX: Abdominal aortic aneurysm, without rupture, unspecified: I71.40

## 2019-06-28 LAB — BASIC METABOLIC PANEL
Anion gap: 9 (ref 5–15)
BUN: 27 mg/dL — ABNORMAL HIGH (ref 8–23)
CO2: 24 mmol/L (ref 22–32)
Calcium: 9.1 mg/dL (ref 8.9–10.3)
Chloride: 106 mmol/L (ref 98–111)
Creatinine, Ser: 1.38 mg/dL — ABNORMAL HIGH (ref 0.44–1.00)
GFR calc Af Amer: 42 mL/min — ABNORMAL LOW (ref >60)
GFR calc non Af Amer: 37 mL/min — ABNORMAL LOW (ref >60)
Glucose, Bld: 100 mg/dL — ABNORMAL HIGH (ref 70–99)
Potassium: 4.7 mmol/L (ref 3.5–5.1)
Sodium: 139 mmol/L (ref 135–145)

## 2019-06-28 LAB — CBC
HCT: 30.2 % — ABNORMAL LOW (ref 36.0–46.0)
Hemoglobin: 9.4 g/dL — ABNORMAL LOW (ref 12.0–15.0)
MCH: 34.4 pg — ABNORMAL HIGH (ref 26.0–34.0)
MCHC: 31.1 g/dL (ref 30.0–36.0)
MCV: 110.6 fL — ABNORMAL HIGH (ref 80.0–100.0)
Platelets: 182 10*3/uL (ref 150–400)
RBC: 2.73 MIL/uL — ABNORMAL LOW (ref 3.87–5.11)
RDW: 13.5 % (ref 11.5–15.5)
WBC: 4.5 10*3/uL (ref 4.0–10.5)
nRBC: 0 % (ref 0.0–0.2)

## 2019-06-28 LAB — SURGICAL PCR SCREEN
MRSA, PCR: NEGATIVE
Staphylococcus aureus: NEGATIVE

## 2019-06-28 LAB — SARS CORONAVIRUS 2 (TAT 6-24 HRS): SARS Coronavirus 2: NEGATIVE

## 2019-06-28 NOTE — Progress Notes (Addendum)
Bailey Ayala has completed the COVID-19 vaccine series.  PCP - [facility] Cardiologist - Dr. Mertie Clause clearance by H. Meng P.A. 06/25/19 in epic  Chest x-ray - 06/16/19 in epic EKG - 06/22/19 in epic Stress Test - N/A ECHO - 02/22/18 in epic Cardiac Cath - 11/19/19 in epic  Sleep Study - N/A CPAP - N/A  Fasting Blood Sugar - Denies Checks Blood Sugar ___N/A__ times a day  Blood Thinner Instructions: N/A Aspirin Instructions: Yes Last Dose: 06/27/19  Anesthesia review: COPD/emphysema, Stroke/TIA, MI, CAD, CHF, HTN, PAD, AAA 4.0 cm 05/31/19, CKD III, Multiple Myeloma  Patient denies shortness of breath, fever, cough and chest pain at PAT appointment   Patient verbalized understanding of instructions that were given to them at the PAT appointment. Patient was also instructed that they will need to review over the PAT instructions again at home before surgery.

## 2019-06-28 NOTE — Progress Notes (Signed)
Chart given to Janett Billow to review

## 2019-06-30 ENCOUNTER — Encounter: Payer: Self-pay | Admitting: Nurse Practitioner

## 2019-07-01 ENCOUNTER — Encounter: Payer: Medicare Other | Admitting: Family Medicine

## 2019-07-01 ENCOUNTER — Ambulatory Visit (INDEPENDENT_AMBULATORY_CARE_PROVIDER_SITE_OTHER): Payer: Medicare Other | Admitting: Nurse Practitioner

## 2019-07-01 ENCOUNTER — Encounter: Payer: Self-pay | Admitting: Nurse Practitioner

## 2019-07-01 DIAGNOSIS — I130 Hypertensive heart and chronic kidney disease with heart failure and stage 1 through stage 4 chronic kidney disease, or unspecified chronic kidney disease: Secondary | ICD-10-CM

## 2019-07-01 DIAGNOSIS — C9000 Multiple myeloma not having achieved remission: Secondary | ICD-10-CM

## 2019-07-01 DIAGNOSIS — E782 Mixed hyperlipidemia: Secondary | ICD-10-CM | POA: Diagnosis not present

## 2019-07-01 DIAGNOSIS — I2510 Atherosclerotic heart disease of native coronary artery without angina pectoris: Secondary | ICD-10-CM

## 2019-07-01 DIAGNOSIS — N1832 Chronic kidney disease, stage 3b: Secondary | ICD-10-CM

## 2019-07-01 DIAGNOSIS — N183 Chronic kidney disease, stage 3 unspecified: Secondary | ICD-10-CM | POA: Diagnosis not present

## 2019-07-01 DIAGNOSIS — E039 Hypothyroidism, unspecified: Secondary | ICD-10-CM | POA: Diagnosis not present

## 2019-07-01 NOTE — Assessment & Plan Note (Signed)
Chronic, ongoing.  Continue current medication regimen and adjust as needed.  Will see if oncology can add on lipid panel to patient's 07/12/2019 labs to avoid multiple lab draws and added stress of extra travel.  Reached out to oncology via secure chat.

## 2019-07-01 NOTE — Assessment & Plan Note (Signed)
Chronic, ongoing.  Continue current medication regimen and adjust as needed based on labs.  Will see if oncology can add on lipid panel to patient's 07/12/2019 labs to avoid multiple lab draws and added stress of extra travel.  Reached out to oncology via secure chat.

## 2019-07-01 NOTE — H&P (Signed)
TOTAL HIP ADMISSION H&P  Patient is admitted for right total hip arthroplasty, anterior approach.  Subjective:  Chief Complaint:   Right hip primary OA / pain  HPI: Bailey Ayala, 79 y.o. female, has a history of pain and functional disability in the right hip(s) due to arthritis and patient has failed non-surgical conservative treatments for greater than 12 weeks to include NSAID's and/or analgesics, corticosteriod injections, use of assistive devices and activity modification.  Onset of symptoms was gradual starting ~1.5 years ago with gradually worsening course since that time.The patient noted no past surgery on the right hip(s).  Patient currently rates pain in the right hip at 9 out of 10 with activity. Patient has night pain, worsening of pain with activity and weight bearing, trendelenberg gait, pain that interfers with activities of daily living and pain with passive range of motion. Patient has evidence of periarticular osteophytes and joint space narrowing by imaging studies. This condition presents safety issues increasing the risk of falls.  There is no current active infection.  Risks, benefits and expectations were discussed with the patient.  Risks including but not limited to the risk of anesthesia, blood clots, nerve damage, blood vessel damage, failure of the prosthesis, infection and up to and including death.  Patient understand the risks, benefits and expectations and wishes to proceed with surgery.   PCP: Guadalupe Maple, MD  D/C Plans:       Home (obs)  Post-op Meds:       No Rx given   Tranexamic Acid:      To be given - IV   Decadron:      Is to be given  FYI:      Xarelto then ASA (Clotting disorder)  Norco (ok to use per patient)  DME:   Pt already has equipment  PT:   [facility]  Pharmacy: [facility]    Patient Active Problem List   Diagnosis Date Noted  . History of COVID-19 04/11/2019  . Skin callus 04/08/2019  . Anemia in chronic kidney disease  01/18/2019  . Chronic kidney disease, stage III (moderate) 01/18/2019  . Multiple myeloma ([place]) 01/18/2019  . History of CVA (cerebrovascular accident) 11/28/2018  . CAD (coronary artery disease) 11/28/2018  . Abnormal stress test   . Pain due to onychomycosis of toenails of both feet 10/18/2018  . Left lower lobe pulmonary nodule 08/18/2017  . Abdominal aortic aneurysm (AAA) without rupture ([place]) 08/18/2017  . Goals of care, counseling/discussion 08/18/2017  . Benign hypertensive heart and CKD, stage 3 (GFR 30-59), w CHF ([place]) 05/29/2017  . Drug-induced constipation 11/07/2016  . Primary osteoarthritis involving multiple joints 11/07/2016  . Simple chronic bronchitis ([place]) 11/07/2016  . Advanced care planning/counseling discussion 11/02/2016  . S/P shoulder replacement 01/14/2016  . Hypothyroid 11/04/2014  . Hyperlipidemia   . Ulcerated varicose veins of leg, right ([place]) 09/30/2014  . Peripheral arterial occlusive disease ([place]) 09/30/2014   Past Medical History:  Diagnosis Date  . AAA (abdominal aortic aneurysm) ([place])    The largest aortic measurement is 4.0 cm on 05/31/19  . Allergy 1980  . Anemia 09-2017  . Arthritis   . Bleeding from varicose vein   . Blood dyscrasia    BLEEDS EASILY   . Blood transfusion without reported diagnosis 2011 or 12  . Cataract ?  . CHF (congestive heart failure) ([place]) ?  . CKD (chronic kidney disease), stage III    Moderate  . Clotting disorder ([place]) ?  Marland Kitchen  COPD (chronic obstructive pulmonary disease) ([place])   . Coronary artery disease   . Diabetes mellitus without complication ([place])    patient denies  . Diffuse cystic mastopathy   . Emphysema of lung ([place]) ?  Marland Kitchen Emphysema of lung ([place])   . Heartburn   . Hyperlipidemia   . Hypertension   . Hypothyroidism   . Incomplete left bundle branch block (LBBB) 06/22/2019   Noted on EKG  . L4-L5 disc bulge   . Multiple myeloma ([place])   . Myocardial infarction ([place]) 2019  . Osteoporosis,  post-menopausal   . PAD (peripheral artery disease) ([place])   . Renal insufficiency   . Shortness of breath dyspnea    WITH EXERTION   . Stroke ([place])    TIA     2016     WAS FOUND ON SCAN ORDERED FROM NEUROL  . Thyroid disease   . TIA (transient ischemic attack) 2016   left hand weakness  . Ulnar nerve compression, left   . Vertigo 06/22/2019   doing much today 06/28/2019    Past Surgical History:  Procedure Laterality Date  . ABDOMINAL HYSTERECTOMY  1990  . BACK SURGERY  2011  . BREAST EXCISIONAL BIOPSY Left    2010?   Marland Kitchen BREAST SURGERY  1975   A lump removed left breast  . CATARACT EXTRACTION, BILATERAL    . COLONOSCOPY  1998  . EYE SURGERY Bilateral 05/06/14  . JOINT REPLACEMENT     LT KNEE  . KNEE SURGERY Left 2012  . LEFT HEART CATH AND CORONARY ANGIOGRAPHY Left 11/19/2018   Procedure: LEFT HEART CATH AND CORONARY ANGIOGRAPHY;  Surgeon: Wellington Hampshire, MD;  Location: [facility];  Service: Cardiovascular;  Laterality: Left;  . OOPHORECTOMY  1990  . parathyroid transplant     2/3 THYROID REMOVED   (GOITER)  . SPINE SURGERY    . THYROID SURGERY    . TONSILLECTOMY    . TOTAL SHOULDER ARTHROPLASTY Left 01/14/2016   Procedure: LEFT TOTAL SHOULDER ARTHROPLASTY;  Surgeon: Justice Britain, MD;  Location: [facility];  Service: Orthopedics;  Laterality: Left;  . TOTAL SHOULDER ARTHROPLASTY Right 11/03/2016   Procedure: RIGHT TOTAL SHOULDER ARTHROPLASTY;  Surgeon: Justice Britain, MD;  Location: [facility];  Service: Orthopedics;  Laterality: Right;  . ULNAR NERVE REPAIR     LEFT  . vein closure procedure Left 2010    No current facility-administered medications for this encounter.   Current Outpatient Medications  Medication Sig Dispense Refill Last Dose  . acetaminophen (TYLENOL) 500 MG tablet Take 500-1,000 mg by mouth every 6 (six) hours as needed (for pain.).      Marland Kitchen aspirin EC 81 MG tablet Take 1 tablet (81 mg total) by mouth daily. 90 tablet 3   . atorvastatin (LIPITOR) 40 MG  tablet Take 1 tablet (40 mg total) by mouth daily. 90 tablet 3   . benazepril (LOTENSIN) 20 MG tablet Take 1 tablet (20 mg total) by mouth daily. 90 tablet 4   . ferrous sulfate 325 (65 FE) MG tablet Take 325 mg by mouth daily with breakfast.     . levothyroxine (SYNTHROID, LEVOTHROID) 100 MCG tablet Take 1 tablet (100 mcg total) by mouth daily. 90 tablet 4   . meclizine (ANTIVERT) 25 MG tablet Take 1 tablet (25 mg total) by mouth 2 (two) times daily as needed for dizziness. 15 tablet 0   . metoprolol tartrate (LOPRESSOR) 50 MG tablet Take 1 tablet (50 mg total) by  mouth 2 (two) times daily. 60 tablet 11   . Multiple Vitamin (MULTIVITAMIN WITH MINERALS) TABS tablet Take 1 tablet by mouth daily.     Marland Kitchen triamcinolone cream (KENALOG) 0.1 % Apply 1 application topically 2 (two) times daily. (Patient taking differently: Apply 1 application topically 2 (two) times daily as needed (skin irritation/rash.). ) 30 g 0   . traMADol (ULTRAM) 50 MG tablet Take 50 mg by mouth at bedtime as needed.      Allergies  Allergen Reactions  . Codeine Other (See Comments)    hallucinations    . Simvastatin Diarrhea and Nausea Only    Social History   Tobacco Use  . Smoking status: Former Smoker    Packs/day: 3.00    Years: 25.00    Pack years: 75.00    Types: Cigarettes    Quit date: 11/03/1989    Years since quitting: 29.6  . Smokeless tobacco: Never Used  Substance Use Topics  . Alcohol use: Not Currently    Alcohol/week: 0.0 standard drinks    Family History  Problem Relation Age of Onset  . Heart disease Mother        MI  . Arthritis Mother   . Stroke Mother   . Hyperlipidemia Mother   . Hypertension Mother   . Varicose Veins Mother   . Heart disease Father        MI  . Hyperlipidemia Father   . Arthritis Father   . Cancer Father   . COPD Father   . Hearing loss Father   . Hypertension Father   . Vision loss Father   . Diabetes Sister   . COPD Sister   . Heart disease Sister   .  Hyperlipidemia Sister   . Hypertension Sister   . Hyperlipidemia Brother   . COPD Brother   . Diabetes Brother   . Heart disease Brother   . Hypertension Brother   . Arthritis Sister   . Hyperlipidemia Sister   . Hypertension Sister   . Vision loss Sister   . Varicose Veins Sister   . Arthritis Brother   . Varicose Veins Brother   . Arthritis Daughter   . Arthritis Daughter   . Diabetes Daughter   . Cancer Paternal Aunt   . COPD Sister   . Heart disease Sister   . Hyperlipidemia Sister   . Hypertension Sister   . Varicose Veins Sister   . Heart disease Maternal Uncle   . Heart disease Paternal Uncle       Review of Systems  Constitutional: Negative.   HENT: Negative.   Eyes: Negative.   Respiratory: Negative.   Cardiovascular: Negative.   Gastrointestinal: Negative.   Genitourinary: Negative.   Musculoskeletal: Positive for joint pain.  Skin: Negative.   Neurological: Positive for dizziness.  Endo/Heme/Allergies: Negative.   Psychiatric/Behavioral: Negative.      Objective:  Physical Exam  Constitutional: She is oriented to person, place, and time. She appears well-developed.  HENT:  Head: Normocephalic.  Eyes: Pupils are equal, round, and reactive to light.  Neck: No JVD present. No tracheal deviation present. No thyromegaly present.  Cardiovascular: Normal rate, regular rhythm and intact distal pulses.  Respiratory: Effort normal and breath sounds normal. No respiratory distress. She has no wheezes.  GI: Soft. There is no abdominal tenderness. There is no guarding.  Musculoskeletal:     Cervical back: Neck supple.     Right hip: Tenderness and bony tenderness present. No swelling, deformity or  lacerations. Decreased range of motion. Decreased strength.  Lymphadenopathy:    She has no cervical adenopathy.  Neurological: She is alert and oriented to person, place, and time. 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  Skin: Skin is  warm and dry.  Psychiatric: She has a normal mood and affect.    Vital signs in last 24 hours: Temp:  [97.1 F (36.2 C)] 97.1 F (36.2 C) (03/08 1256) Pulse Rate:  [60] 60 (03/08 1256) BP: (158)/(86) 158/86 (03/08 1256)  Labs:   Estimated body mass index is 29.18 kg/m as calculated from the following:   Height as of 06/28/19: _0  (1.651 m).   Weight as of 06/28/19: 79.5 kg.   Imaging Review Plain radiographs demonstrate severe degenerative joint disease of the right hip(s). The bone quality appears to be good for age and reported activity level.      Assessment/Plan:  End stage arthritis, right hip  The patient history, physical examination, clinical judgement of the provider and imaging studies are consistent with end stage degenerative joint disease of the right hip(s) and total hip arthroplasty is deemed medically necessary. The treatment options including medical management, injection therapy, arthroscopy and arthroplasty were discussed at length. The risks and benefits of total hip arthroplasty were presented and reviewed. The risks due to aseptic loosening, infection, stiffness, dislocation/subluxation,  thromboembolic complications and other imponderables were discussed.  The patient acknowledged the explanation, agreed to proceed with the plan and consent was signed. Patient is being admitted for inpatient treatment for surgery, pain control, PT, OT, prophylactic antibiotics, VTE prophylaxis, progressive ambulation and ADL's and discharge planning.The patient is planning to be discharged home.

## 2019-07-01 NOTE — Progress Notes (Signed)
Anesthesia Chart Review   Case: [id number] Date/Time: 07/02/19 1345   Procedure: TOTAL HIP ARTHROPLASTY ANTERIOR APPROACH (Right Hip) - 70 mins   Anesthesia type: Spinal   Pre-op diagnosis: Right hip osteoarthritis   Location: [facility] ROOM 10 / [facility]   Surgeons: Paralee Cancel, MD      DISCUSSION:79 y.o. former smoker (11 pack years, quit 11/03/89) with h/o HTN, HLD, PAD, COPD, TIA 2016, hypothyroidism, CHF, CKD Stage III, smoldering myeloma, macrocytic anemia (followed with hematology/oncology), right hip OA scheduled for above procedure 07/02/19 with Dr. Paralee Cancel.   Pt will begin therapy for myeloma after recovery from hip surgery per hematologist.    Cardiac cath 11/19/2018 with Severe one-vessel coronary artery disease with chronically occluded mid LAD with left to left collaterals.  Moderate RCA disease and mild left circumflex disease.  The coronary arteries are moderately to severely calcified. EF 45-50%.  Revascularization reserved for refractory symptoms.  Per results, "The patient is likely at moderate risk for hip replacement."  Pt cleared by cardiology 06/26/19.  Per Almyra Deforest, PA-C, "Chart reviewed as part of pre-operative protocol coverage. Patient was contacted 06/26/2019 in reference to pre-operative risk assessment for pending surgery as outlined below.  LEVI CRASS was last seen on 05/07/2019 by Dr. Fletcher Anon.  Since that day, YAHAIRA BRUSKI has done well without chest pain or shortness of breath. Therefore, based on ACC/AHA guidelines, the patient would be at acceptable risk for the planned procedure without further cardiovascular testing."    Anticipate pt can proceed with planned procedure barring acute status change.   VS: BP (!) 150/52 (BP Location: Right Arm)   Pulse 68   Temp 36.9 C (Oral)   Resp 18   Ht '5\' 5"'  (1.651 m)   Wt 79.5 kg   SpO2 100%   BMI 29.18 kg/m   PROVIDERS: Guadalupe Maple, MD is PCP   Kathlyn Sacramento, MD is Cardiologist  LABS: Labs reviewed:  Acceptable for surgery. (all labs ordered are listed, but only abnormal results are displayed)  Labs Reviewed  BASIC METABOLIC PANEL - Abnormal; Notable for the following components:      Result Value   Glucose, Bld 100 (*)    BUN 27 (*)    Creatinine, Ser 1.38 (*)    GFR calc non Af Amer 37 (*)    GFR calc Af Amer 42 (*)    All other components within normal limits  CBC - Abnormal; Notable for the following components:   RBC 2.73 (*)    Hemoglobin 9.4 (*)    HCT 30.2 (*)    MCV 110.6 (*)    MCH 34.4 (*)    All other components within normal limits  SURGICAL PCR SCREEN     IMAGES:   EKG: 06/22/19 Rate 67 bpm Sinus rhythm Incomplete left bundle branch block Left ventricular hypertrophy Anterior Q waves, possibly due to LVH  CV: Cardiac Cath 11/19/2018  Mid LAD-1 lesion is 80% stenosed.  Mid LAD-2 lesion is 100% stenosed.  There is mild left ventricular systolic dysfunction.  LV end diastolic pressure is mildly elevated.  The left ventricular ejection fraction is 45-50% by visual estimate.  Mid RCA lesion is 50% stenosed.  Prox RCA lesion is 30% stenosed.   1.  Severe one-vessel coronary artery disease with chronically occluded mid LAD with left to left collaterals.  Moderate RCA disease and mild left circumflex disease.  The coronary arteries are moderately to severely calcified. 2.  Mildly reduced LV  systolic function with an EF of 45 to 50% with anteroapical hypokinesis. 3.  Mildly elevated left ventricular end-diastolic pressure.  Recommendations: Recommend aggressive medical therapy. I added metoprolol for antianginal relief.  Reserve revascularization for refractory symptoms. The patient is likely at moderate risk for hip replacement.  Echo 02/21/2018 Study Conclusions   - Left ventricle: The cavity size was normal. Systolic function was  normal. The estimated ejection fraction was in the range of 55%  to 60%. Wall motion was normal; there were  no regional wall  motion abnormalities. Doppler parameters are consistent with  abnormal left ventricular relaxation (grade 1 diastolic  dysfunction).  - Mitral valve: There was mild regurgitation.  - Right ventricle: Systolic function was normal.  - Tricuspid valve: There was mild-moderate regurgitation.  - Pulmonary arteries: Systolic pressure was mildly elevated. Past Medical History:  Diagnosis Date  . AAA (abdominal aortic aneurysm) ([place])    The largest aortic measurement is 4.0 cm on 05/31/19  . Allergy 1980  . Anemia 09-2017  . Arthritis   . Bleeding from varicose vein   . Blood dyscrasia    BLEEDS EASILY   . Blood transfusion without reported diagnosis 2011 or 12  . Cataract ?  . CHF (congestive heart failure) ([place]) ?  . CKD (chronic kidney disease), stage III    Moderate  . Clotting disorder ([place]) ?  Marland Kitchen COPD (chronic obstructive pulmonary disease) ([place])   . Coronary artery disease   . Diabetes mellitus without complication ([place])    patient denies  . Diffuse cystic mastopathy   . Emphysema of lung ([place]) ?  Marland Kitchen Emphysema of lung ([place])   . Heartburn   . Hyperlipidemia   . Hypertension   . Hypothyroidism   . Incomplete left bundle branch block (LBBB) 06/22/2019   Noted on EKG  . L4-L5 disc bulge   . Multiple myeloma ([place])   . Myocardial infarction ([place]) 2019  . Osteoporosis, post-menopausal   . PAD (peripheral artery disease) ([place])   . Renal insufficiency   . Shortness of breath dyspnea    WITH EXERTION   . Stroke ([place])    TIA     2016     WAS FOUND ON SCAN ORDERED FROM NEUROL  . Thyroid disease   . TIA (transient ischemic attack) 2016   left hand weakness  . Ulnar nerve compression, left   . Vertigo 06/22/2019   doing much today 06/28/2019    Past Surgical History:  Procedure Laterality Date  . ABDOMINAL HYSTERECTOMY  1990  . BACK SURGERY  2011  . BREAST EXCISIONAL BIOPSY Left    2010?   Marland Kitchen BREAST SURGERY  1975   A lump removed left breast  .  CATARACT EXTRACTION, BILATERAL    . COLONOSCOPY  1998  . EYE SURGERY Bilateral 05/06/14  . JOINT REPLACEMENT     LT KNEE  . KNEE SURGERY Left 2012  . LEFT HEART CATH AND CORONARY ANGIOGRAPHY Left 11/19/2018   Procedure: LEFT HEART CATH AND CORONARY ANGIOGRAPHY;  Surgeon: Wellington Hampshire, MD;  Location: [facility];  Service: Cardiovascular;  Laterality: Left;  . OOPHORECTOMY  1990  . parathyroid transplant     2/3 THYROID REMOVED   (GOITER)  . SPINE SURGERY    . THYROID SURGERY    . TONSILLECTOMY    . TOTAL SHOULDER ARTHROPLASTY Left 01/14/2016   Procedure: LEFT TOTAL SHOULDER ARTHROPLASTY;  Surgeon: Justice Britain, MD;  Location: [facility];  Service: Orthopedics;  Laterality: Left;  . TOTAL SHOULDER ARTHROPLASTY Right 11/03/2016   Procedure: RIGHT TOTAL SHOULDER ARTHROPLASTY;  Surgeon: Justice Britain, MD;  Location: [facility];  Service: Orthopedics;  Laterality: Right;  . ULNAR NERVE REPAIR     LEFT  . vein closure procedure Left 2010    MEDICATIONS: . acetaminophen (TYLENOL) 500 MG tablet  . aspirin EC 81 MG tablet  . atorvastatin (LIPITOR) 40 MG tablet  . benazepril (LOTENSIN) 20 MG tablet  . ferrous sulfate 325 (65 FE) MG tablet  . levothyroxine (SYNTHROID, LEVOTHROID) 100 MCG tablet  . meclizine (ANTIVERT) 25 MG tablet  . metoprolol tartrate (LOPRESSOR) 50 MG tablet  . Multiple Vitamin (MULTIVITAMIN WITH MINERALS) TABS tablet  . traMADol (ULTRAM) 50 MG tablet  . triamcinolone cream (KENALOG) 0.1 %   No current facility-administered medications for this encounter.     Maia Plan [facility] Pre-Surgical Testing [phone] 07/01/19  9:50 AM

## 2019-07-01 NOTE — Progress Notes (Signed)
BP (!) 158/86   Pulse 60   Temp (!) 97.1 F (36.2 C) (Oral)    Subjective:    Patient ID: Bailey Ayala, female    DOB: January 28, 1941, 79 y.o.   MRN: [id number]  HPI: Bailey Ayala is a 79 y.o. female  Chief Complaint  Patient presents with  . Hyperlipidemia  . Hypertension  . Hypothyroidism    . This visit was completed via telephone due to the restrictions of the COVID-19 pandemic. All issues as above were discussed and addressed but no physical exam was performed. If it was felt that the patient should be evaluated in the office, they were directed there. The patient verbally consented to this visit. Patient was unable to complete an audio/visual visit due to Lack of equipment. Due to the catastrophic nature of the COVID-19 pandemic, this visit was done through audio contact only. . Location of the patient: home . Location of the provider: work . Those involved with this call:  . Provider: Marnee Guarneri, DNP . CMA: Yvonna Alanis, CMA . Front Desk/Registration: Don Perking  . Time spent on call: 15 minutes on the phone discussing health concerns. 15 minutes total spent in review of patient's record and preparation of their chart.  . I verified patient identity using two factors (patient name and date of birth). Patient consents verbally to being seen via telemedicine visit today.    HYPERTENSION / HYPERLIPIDEMIA Continues on Benazepril, Metoprolol, and Atorvastatin daily.  Sees cardiology, Dr. Fletcher Anon, again in July. Satisfied with current treatment? yes Duration of hypertension: chronic BP monitoring frequency: rarely BP range:  BP medication side effects: no Duration of hyperlipidemia: chronic Cholesterol medication side effects: no Cholesterol supplements: none Medication compliance: good compliance Aspirin: yes Recent stressors: no Recurrent headaches: no Visual changes: no Palpitations: no Dyspnea: no Chest pain: no Lower extremity edema:  yes Dizzy/lightheaded: no   CHRONIC KIDNEY DISEASE Recent March labs with CRT 1.38 and GFR 37.   CKD status: stable Medications renally dose: yes Previous renal evaluation: no Pneumovax:  Up to Date Influenza Vaccine:  Up to Date  HYPOTHYROIDISM Last TSH in January 2020 == 0.514.  Continues on Levothyroxine 100 MCG daily. Thyroid control status:stable Satisfied with current treatment? yes Medication side effects: no Medication compliance: good compliance Etiology of hypothyroidism:  Recent dose adjustment:no Fatigue: no Cold intolerance: no Heat intolerance: no Weight gain: no Weight loss: no Constipation: no Diarrhea/loose stools: no Palpitations: no Lower extremity edema: yes -- started 6 to 8 months ago Anxiety/depressed mood: no   MULTIPLE MYELOMA: Followed by oncology with recent imaging noting increased lesions.  Saw Dr. Mike Gip on 06/26/2019 and Beckey Rutter NP on 06/27/2019 with plan for chemotherapy.  Is going for right hip replacement tomorrow, after surgeon gives okay can have chemotherapy.  Has labs on the 19th with oncology.    Relevant past medical, surgical, family and social history reviewed and updated as indicated. Interim medical history since our last visit reviewed. Allergies and medications reviewed and updated.  Review of Systems  Constitutional: Negative for activity change, appetite change, diaphoresis, fatigue and fever.  Respiratory: Negative for cough, chest tightness, shortness of breath and wheezing.   Cardiovascular: Negative for chest pain, palpitations and leg swelling.  Gastrointestinal: Negative.   Endocrine: Negative for cold intolerance and heat intolerance.  Musculoskeletal: Positive for arthralgias.  Neurological: Negative.   Psychiatric/Behavioral: Negative.     Per HPI unless specifically indicated above     Objective:    BP Marland Kitchen)  158/86   Pulse 60   Temp (!) 97.1 F (36.2 C) (Oral)   Wt Readings from Last 3 Encounters:   06/28/19 175 lb 6 oz (79.5 kg)  06/25/19 173 lb 6.3 oz (78.7 kg)  06/22/19 170 lb (77.1 kg)    Physical Exam   Unable to perform due to telephone visit only  Results for orders placed or performed during the hospital encounter of 06/28/19  SARS CORONAVIRUS 2 (TAT 6-24 HRS) Nasopharyngeal Nasopharyngeal Swab   Specimen: Nasopharyngeal Swab  Result Value Ref Range   SARS Coronavirus 2 NEGATIVE NEGATIVE      Assessment & Plan:   Problem List Items Addressed This Visit      Cardiovascular and Mediastinum   Benign hypertensive heart and CKD, stage 3 (GFR 30-59), w CHF (HCC)    Chronic, ongoing with some elevation in BP today, does not check often at home and has increased stressors at this time with surgery and chemo upcoming.  Recommend she check BP at home at least daily and document.  Will continue collaboration with cardiology and current medication regimen, adjust this as needed.  Recent labs done last week.  Return in 6 months.        Endocrine   Hypothyroid    Chronic, ongoing.  Continue current medication regimen and adjust as needed based on labs.  Will see if oncology can add on lipid panel to patient's 07/12/2019 labs to avoid multiple lab draws and added stress of extra travel.  Reached out to oncology via secure chat.      Relevant Orders   TSH     Genitourinary   Chronic kidney disease, stage III (moderate)    Chronic, ongoing.  Stable labs with no decline noted at this time.  Continue to monitor and continue Benazepril for kidney protection.  Recent labs drawn last week.        Other   Hyperlipidemia    Chronic, ongoing.  Continue current medication regimen and adjust as needed.  Will see if oncology can add on lipid panel to patient's 07/12/2019 labs to avoid multiple lab draws and added stress of extra travel.  Reached out to oncology via secure chat.      Relevant Orders   Lipid Panel w/o Chol/HDL Ratio   Multiple myeloma (HCC)    Ongoing, is to receive  chemotherapy upcoming.  Continue collaboration with oncology.         I discussed the assessment and treatment plan with the patient. The patient was provided an opportunity to ask questions and all were answered. The patient agreed with the plan and demonstrated an understanding of the instructions.   The patient was advised to call back or seek an in-person evaluation if the symptoms worsen or if the condition fails to improve as anticipated.   I provided 15+ minutes of time during this encounter.  Follow up plan: Return in about 6 months (around 01/01/2020) for HTN/HLD, thyroid, CKD.

## 2019-07-01 NOTE — Assessment & Plan Note (Signed)
Ongoing, is to receive chemotherapy upcoming.  Continue collaboration with oncology.

## 2019-07-01 NOTE — Assessment & Plan Note (Signed)
Chronic, ongoing.  Stable labs with no decline noted at this time.  Continue to monitor and continue Benazepril for kidney protection.  Recent labs drawn last week.

## 2019-07-01 NOTE — Assessment & Plan Note (Signed)
Chronic, ongoing with some elevation in BP today, does not check often at home and has increased stressors at this time with surgery and chemo upcoming.  Recommend she check BP at home at least daily and document.  Will continue collaboration with cardiology and current medication regimen, adjust this as needed.  Recent labs done last week.  Return in 6 months.

## 2019-07-01 NOTE — Patient Instructions (Signed)
Hypothyroidism  Hypothyroidism is when the thyroid gland does not make enough of certain hormones (it is underactive). The thyroid gland is a small gland located in the lower front part of the neck, just in front of the windpipe (trachea). This gland makes hormones that help control how the body uses food for energy (metabolism) as well as how the heart and brain function. These hormones also play a role in keeping your bones strong. When the thyroid is underactive, it produces too little of the hormones thyroxine (T4) and triiodothyronine (T3). What are the causes? This condition may be caused by:  Hashimoto's disease. This is a disease in which the body's disease-fighting system (immune system) attacks the thyroid gland. This is the most common cause.  Viral infections.  Pregnancy.  Certain medicines.  Birth defects.  Past radiation treatments to the head or neck for cancer.  Past treatment with radioactive iodine.  Past exposure to radiation in the environment.  Past surgical removal of part or all of the thyroid.  Problems with a gland in the center of the brain (pituitary gland).  Lack of enough iodine in the diet. What increases the risk? You are more likely to develop this condition if:  You are female.  You have a family history of thyroid conditions.  You use a medicine called lithium.  You take medicines that affect the immune system (immunosuppressants). What are the signs or symptoms? Symptoms of this condition include:  Feeling as though you have no energy (lethargy).  Not being able to tolerate cold.  Weight gain that is not explained by a change in diet or exercise habits.  Lack of appetite.  Dry skin.  Coarse hair.  Menstrual irregularity.  Slowing of thought processes.  Constipation.  Sadness or depression. How is this diagnosed? This condition may be diagnosed based on:  Your symptoms, your medical history, and a physical exam.  Blood  tests. You may also have imaging tests, such as an ultrasound or MRI. How is this treated? This condition is treated with medicine that replaces the thyroid hormones that your body does not make. After you begin treatment, it may take several weeks for symptoms to go away. Follow these instructions at home:  Take over-the-counter and prescription medicines only as told by your health care provider.  If you start taking any new medicines, tell your health care provider.  Keep all follow-up visits as told by your health care provider. This is important. ? As your condition improves, your dosage of thyroid hormone medicine may change. ? You will need to have blood tests regularly so that your health care provider can monitor your condition. Contact a health care provider if:  Your symptoms do not get better with treatment.  You are taking thyroid replacement medicine and you: ? Sweat a lot. ? Have tremors. ? Feel anxious. ? Lose weight rapidly. ? Cannot tolerate heat. ? Have emotional swings. ? Have diarrhea. ? Feel weak. Get help right away if you have:  Chest pain.  An irregular heartbeat.  A rapid heartbeat.  Difficulty breathing. Summary  Hypothyroidism is when the thyroid gland does not make enough of certain hormones (it is underactive).  When the thyroid is underactive, it produces too little of the hormones thyroxine (T4) and triiodothyronine (T3).  The most common cause is Hashimoto's disease, a disease in which the body's disease-fighting system (immune system) attacks the thyroid gland. The condition can also be caused by viral infections, medicine, pregnancy, or past   radiation treatment to the head or neck.  Symptoms may include weight gain, dry skin, constipation, feeling as though you do not have energy, and not being able to tolerate cold.  This condition is treated with medicine to replace the thyroid hormones that your body does not make. This information  is not intended to replace advice given to you by your health care provider. Make sure you discuss any questions you have with your health care provider. Document Revised: 03/24/2017 Document Reviewed: 03/22/2017 Elsevier Patient Education  2020 [employer]  

## 2019-07-02 ENCOUNTER — Encounter (HOSPITAL_COMMUNITY)
Admission: RE | Disposition: A | Discharge status: Home Health | Payer: Self-pay | Source: Home / Self Care | Attending: Orthopedic Surgery

## 2019-07-02 ENCOUNTER — Other Ambulatory Visit: Payer: Self-pay

## 2019-07-02 ENCOUNTER — Inpatient Hospital Stay (HOSPITAL_COMMUNITY): Payer: Medicare Other

## 2019-07-02 ENCOUNTER — Telehealth: Payer: Self-pay | Admitting: Nurse Practitioner

## 2019-07-02 ENCOUNTER — Inpatient Hospital Stay (HOSPITAL_COMMUNITY): Payer: Medicare Other | Admitting: Physician Assistant

## 2019-07-02 ENCOUNTER — Encounter (HOSPITAL_COMMUNITY): Payer: Self-pay | Admitting: Orthopedic Surgery

## 2019-07-02 ENCOUNTER — Inpatient Hospital Stay (HOSPITAL_COMMUNITY): Payer: Medicare Other | Admitting: Anesthesiology

## 2019-07-02 ENCOUNTER — Inpatient Hospital Stay (HOSPITAL_COMMUNITY)
Admission: RE | Admit: 2019-07-02 | Discharge: 2019-07-05 | DRG: 470 | Disposition: A | Discharge status: Home Health | Payer: Medicare Other | Attending: Orthopedic Surgery | Admitting: Orthopedic Surgery

## 2019-07-02 ENCOUNTER — Observation Stay (HOSPITAL_COMMUNITY): Payer: Medicare Other

## 2019-07-02 DIAGNOSIS — C9000 Multiple myeloma not having achieved remission: Secondary | ICD-10-CM | POA: Diagnosis not present

## 2019-07-02 DIAGNOSIS — Z8349 Family history of other endocrine, nutritional and metabolic diseases: Secondary | ICD-10-CM

## 2019-07-02 DIAGNOSIS — Z96649 Presence of unspecified artificial hip joint: Secondary | ICD-10-CM

## 2019-07-02 DIAGNOSIS — E785 Hyperlipidemia, unspecified: Secondary | ICD-10-CM | POA: Diagnosis present

## 2019-07-02 DIAGNOSIS — Z885 Allergy status to narcotic agent status: Secondary | ICD-10-CM

## 2019-07-02 DIAGNOSIS — D62 Acute posthemorrhagic anemia: Secondary | ICD-10-CM | POA: Diagnosis not present

## 2019-07-02 DIAGNOSIS — I130 Hypertensive heart and chronic kidney disease with heart failure and stage 1 through stage 4 chronic kidney disease, or unspecified chronic kidney disease: Secondary | ICD-10-CM | POA: Diagnosis present

## 2019-07-02 DIAGNOSIS — N1831 Chronic kidney disease, stage 3a: Secondary | ICD-10-CM | POA: Diagnosis not present

## 2019-07-02 DIAGNOSIS — I2510 Atherosclerotic heart disease of native coronary artery without angina pectoris: Secondary | ICD-10-CM | POA: Diagnosis present

## 2019-07-02 DIAGNOSIS — Z96641 Presence of right artificial hip joint: Secondary | ICD-10-CM | POA: Diagnosis not present

## 2019-07-02 DIAGNOSIS — Z972 Presence of dental prosthetic device (complete) (partial): Secondary | ICD-10-CM

## 2019-07-02 DIAGNOSIS — I252 Old myocardial infarction: Secondary | ICD-10-CM

## 2019-07-02 DIAGNOSIS — Z888 Allergy status to other drugs, medicaments and biological substances status: Secondary | ICD-10-CM

## 2019-07-02 DIAGNOSIS — E039 Hypothyroidism, unspecified: Secondary | ICD-10-CM

## 2019-07-02 DIAGNOSIS — M25751 Osteophyte, right hip: Secondary | ICD-10-CM | POA: Diagnosis not present

## 2019-07-02 DIAGNOSIS — D689 Coagulation defect, unspecified: Secondary | ICD-10-CM | POA: Diagnosis not present

## 2019-07-02 DIAGNOSIS — E663 Overweight: Secondary | ICD-10-CM | POA: Diagnosis present

## 2019-07-02 DIAGNOSIS — Z6829 Body mass index (BMI) 29.0-29.9, adult: Secondary | ICD-10-CM

## 2019-07-02 DIAGNOSIS — M1611 Unilateral primary osteoarthritis, right hip: Principal | ICD-10-CM | POA: Diagnosis present

## 2019-07-02 DIAGNOSIS — Z8261 Family history of arthritis: Secondary | ICD-10-CM

## 2019-07-02 DIAGNOSIS — Z8673 Personal history of transient ischemic attack (TIA), and cerebral infarction without residual deficits: Secondary | ICD-10-CM

## 2019-07-02 DIAGNOSIS — Z79899 Other long term (current) drug therapy: Secondary | ICD-10-CM

## 2019-07-02 DIAGNOSIS — Z825 Family history of asthma and other chronic lower respiratory diseases: Secondary | ICD-10-CM

## 2019-07-02 DIAGNOSIS — Z471 Aftercare following joint replacement surgery: Secondary | ICD-10-CM | POA: Diagnosis not present

## 2019-07-02 DIAGNOSIS — Z833 Family history of diabetes mellitus: Secondary | ICD-10-CM

## 2019-07-02 DIAGNOSIS — N183 Chronic kidney disease, stage 3 unspecified: Secondary | ICD-10-CM | POA: Diagnosis not present

## 2019-07-02 DIAGNOSIS — I509 Heart failure, unspecified: Secondary | ICD-10-CM | POA: Diagnosis not present

## 2019-07-02 DIAGNOSIS — E1122 Type 2 diabetes mellitus with diabetic chronic kidney disease: Secondary | ICD-10-CM | POA: Diagnosis not present

## 2019-07-02 DIAGNOSIS — E1151 Type 2 diabetes mellitus with diabetic peripheral angiopathy without gangrene: Secondary | ICD-10-CM | POA: Diagnosis not present

## 2019-07-02 DIAGNOSIS — J439 Emphysema, unspecified: Secondary | ICD-10-CM | POA: Diagnosis present

## 2019-07-02 DIAGNOSIS — Z9181 History of falling: Secondary | ICD-10-CM

## 2019-07-02 DIAGNOSIS — Z79890 Hormone replacement therapy (postmenopausal): Secondary | ICD-10-CM

## 2019-07-02 DIAGNOSIS — Z419 Encounter for procedure for purposes other than remedying health state, unspecified: Secondary | ICD-10-CM

## 2019-07-02 DIAGNOSIS — Z7982 Long term (current) use of aspirin: Secondary | ICD-10-CM

## 2019-07-02 DIAGNOSIS — Z87891 Personal history of nicotine dependence: Secondary | ICD-10-CM

## 2019-07-02 DIAGNOSIS — M810 Age-related osteoporosis without current pathological fracture: Secondary | ICD-10-CM | POA: Diagnosis present

## 2019-07-02 DIAGNOSIS — Z8616 Personal history of COVID-19: Secondary | ICD-10-CM

## 2019-07-02 DIAGNOSIS — E782 Mixed hyperlipidemia: Secondary | ICD-10-CM

## 2019-07-02 DIAGNOSIS — Z8249 Family history of ischemic heart disease and other diseases of the circulatory system: Secondary | ICD-10-CM

## 2019-07-02 HISTORY — PX: TOTAL HIP ARTHROPLASTY: SHX124

## 2019-07-02 LAB — GLUCOSE, CAPILLARY: Glucose-Capillary: 103 mg/dL — ABNORMAL HIGH (ref 70–99)

## 2019-07-02 LAB — ABO/RH: ABO/RH(D): A POS

## 2019-07-02 SURGERY — ARTHROPLASTY, HIP, TOTAL, ANTERIOR APPROACH
Anesthesia: General | Site: Hip | Laterality: Right

## 2019-07-02 MED ORDER — ATORVASTATIN CALCIUM 40 MG PO TABS
40.0000 mg | ORAL_TABLET | Freq: Every day | ORAL | Status: DC
  Administered 2019-07-03 – 2019-07-05 (×3): 40 mg via ORAL
  Filled 2019-07-02 (×3): qty 1

## 2019-07-02 MED ORDER — FENTANYL CITRATE (PF) 100 MCG/2ML IJ SOLN
INTRAMUSCULAR | Status: AC
  Filled 2019-07-02: qty 2

## 2019-07-02 MED ORDER — ONDANSETRON HCL 4 MG/2ML IJ SOLN: INTRAMUSCULAR | Status: DC | PRN

## 2019-07-02 MED ORDER — CHLORHEXIDINE GLUCONATE 4 % EX LIQD
60.0000 mL | Freq: Once | CUTANEOUS | Status: AC
  Administered 2019-07-02: 4 via TOPICAL

## 2019-07-02 MED ORDER — LIDOCAINE 2% (20 MG/ML) 5 ML SYRINGE
INTRAMUSCULAR | Status: DC | PRN
  Administered 2019-07-02: 60 mg via INTRAVENOUS

## 2019-07-02 MED ORDER — PROPOFOL 10 MG/ML IV BOLUS
INTRAVENOUS | Status: DC | PRN
  Administered 2019-07-02: 150 mg via INTRAVENOUS

## 2019-07-02 MED ORDER — ALBUMIN HUMAN 5 % IV SOLN: INTRAVENOUS | Status: DC | PRN

## 2019-07-02 MED ORDER — DEXAMETHASONE SODIUM PHOSPHATE 10 MG/ML IJ SOLN
INTRAMUSCULAR | Status: AC
  Filled 2019-07-02: qty 1

## 2019-07-02 MED ORDER — BISACODYL 10 MG RE SUPP: 10.0000 mg | Freq: Every day | RECTAL | Status: DC | PRN

## 2019-07-02 MED ORDER — ROCURONIUM BROMIDE 10 MG/ML (PF) SYRINGE
PREFILLED_SYRINGE | INTRAVENOUS | Status: DC | PRN
  Administered 2019-07-02: 40 mg via INTRAVENOUS

## 2019-07-02 MED ORDER — ONDANSETRON HCL 4 MG/2ML IJ SOLN
INTRAMUSCULAR | Status: AC
  Filled 2019-07-02: qty 2

## 2019-07-02 MED ORDER — ONDANSETRON HCL 4 MG PO TABS: 4.0000 mg | ORAL_TABLET | Freq: Four times a day (QID) | ORAL | Status: DC | PRN

## 2019-07-02 MED ORDER — ONDANSETRON HCL 4 MG/2ML IJ SOLN: 4.0000 mg | Freq: Four times a day (QID) | INTRAMUSCULAR | Status: DC | PRN

## 2019-07-02 MED ORDER — SUGAMMADEX SODIUM 200 MG/2ML IV SOLN
INTRAVENOUS | Status: DC | PRN
  Administered 2019-07-02: 159.2 mg via INTRAVENOUS

## 2019-07-02 MED ORDER — FENTANYL CITRATE (PF) 250 MCG/5ML IJ SOLN
INTRAMUSCULAR | Status: DC | PRN
  Administered 2019-07-02 (×2): 25 ug via INTRAVENOUS
  Administered 2019-07-02: 150 ug via INTRAVENOUS
  Administered 2019-07-02 (×2): 25 ug via INTRAVENOUS

## 2019-07-02 MED ORDER — ACETAMINOPHEN 325 MG PO TABS: 325.0000 mg | ORAL_TABLET | Freq: Four times a day (QID) | ORAL | Status: DC | PRN

## 2019-07-02 MED ORDER — SODIUM CHLORIDE 0.9 % IR SOLN
Status: DC | PRN
  Administered 2019-07-02: 1000 mL

## 2019-07-02 MED ORDER — METHOCARBAMOL 500 MG IVPB - SIMPLE MED
500.0000 mg | Freq: Four times a day (QID) | INTRAVENOUS | Status: DC | PRN
  Filled 2019-07-02: qty 50

## 2019-07-02 MED ORDER — ALUM & MAG HYDROXIDE-SIMETH 200-200-20 MG/5ML PO SUSP: 15.0000 mL | ORAL | Status: DC | PRN

## 2019-07-02 MED ORDER — HYDROCODONE-ACETAMINOPHEN 5-325 MG PO TABS
1.0000 | ORAL_TABLET | ORAL | Status: DC | PRN
  Administered 2019-07-02: 1 via ORAL
  Administered 2019-07-03: 2 via ORAL
  Administered 2019-07-03 – 2019-07-04 (×4): 1 via ORAL
  Administered 2019-07-04 – 2019-07-05 (×5): 2 via ORAL
  Filled 2019-07-02: qty 1
  Filled 2019-07-02: qty 2
  Filled 2019-07-02 (×2): qty 1
  Filled 2019-07-02: qty 2
  Filled 2019-07-02: qty 1
  Filled 2019-07-02 (×6): qty 2

## 2019-07-02 MED ORDER — FERROUS SULFATE 325 (65 FE) MG PO TABS
325.0000 mg | ORAL_TABLET | Freq: Three times a day (TID) | ORAL | Status: DC
  Administered 2019-07-03 – 2019-07-05 (×5): 325 mg via ORAL
  Filled 2019-07-02 (×5): qty 1

## 2019-07-02 MED ORDER — MENTHOL 3 MG MT LOZG: 1.0000 | LOZENGE | OROMUCOSAL | Status: DC | PRN

## 2019-07-02 MED ORDER — ACETAMINOPHEN 500 MG PO TABS
1000.0000 mg | ORAL_TABLET | ORAL | Status: AC
  Administered 2019-07-02: 1000 mg via ORAL
  Filled 2019-07-02: qty 2

## 2019-07-02 MED ORDER — MECLIZINE HCL 25 MG PO TABS: 25.0000 mg | ORAL_TABLET | Freq: Two times a day (BID) | ORAL | Status: DC | PRN

## 2019-07-02 MED ORDER — ACETAMINOPHEN 500 MG PO TABS
500.0000 mg | ORAL_TABLET | Freq: Four times a day (QID) | ORAL | Status: AC
  Administered 2019-07-02 – 2019-07-03 (×3): 500 mg via ORAL
  Filled 2019-07-02 (×3): qty 1

## 2019-07-02 MED ORDER — DOCUSATE SODIUM 100 MG PO CAPS
100.0000 mg | ORAL_CAPSULE | Freq: Two times a day (BID) | ORAL | Status: DC
  Administered 2019-07-02 – 2019-07-05 (×5): 100 mg via ORAL
  Filled 2019-07-02 (×6): qty 1

## 2019-07-02 MED ORDER — DIPHENHYDRAMINE HCL 12.5 MG/5ML PO ELIX: 12.5000 mg | ORAL_SOLUTION | ORAL | Status: DC | PRN

## 2019-07-02 MED ORDER — CEFAZOLIN SODIUM-DEXTROSE 2-4 GM/100ML-% IV SOLN
2.0000 g | Freq: Four times a day (QID) | INTRAVENOUS | Status: AC
  Administered 2019-07-02 – 2019-07-03 (×2): 2 g via INTRAVENOUS
  Filled 2019-07-02 (×2): qty 100

## 2019-07-02 MED ORDER — PHENYLEPHRINE HCL-NACL 10-0.9 MG/250ML-% IV SOLN
INTRAVENOUS | Status: DC | PRN
  Administered 2019-07-02: 40 ug/min via INTRAVENOUS

## 2019-07-02 MED ORDER — FENTANYL CITRATE (PF) 100 MCG/2ML IJ SOLN
25.0000 ug | INTRAMUSCULAR | Status: DC | PRN
  Administered 2019-07-02 (×4): 25 ug via INTRAVENOUS

## 2019-07-02 MED ORDER — LACTATED RINGERS IV SOLN: INTRAVENOUS | Status: DC

## 2019-07-02 MED ORDER — ALBUMIN HUMAN 5 % IV SOLN
INTRAVENOUS | Status: AC
  Filled 2019-07-02: qty 250

## 2019-07-02 MED ORDER — HYDROCODONE-ACETAMINOPHEN 7.5-325 MG PO TABS: 1.0000 | ORAL_TABLET | ORAL | Status: DC | PRN

## 2019-07-02 MED ORDER — METOPROLOL TARTRATE 50 MG PO TABS
50.0000 mg | ORAL_TABLET | Freq: Two times a day (BID) | ORAL | Status: DC
  Administered 2019-07-03 – 2019-07-05 (×5): 50 mg via ORAL
  Filled 2019-07-02 (×5): qty 1

## 2019-07-02 MED ORDER — MORPHINE SULFATE (PF) 2 MG/ML IV SOLN
0.5000 mg | INTRAVENOUS | Status: DC | PRN
  Administered 2019-07-02: 0.5 mg via INTRAVENOUS
  Filled 2019-07-02: qty 1

## 2019-07-02 MED ORDER — METOCLOPRAMIDE HCL 5 MG/ML IJ SOLN: 5.0000 mg | Freq: Three times a day (TID) | INTRAMUSCULAR | Status: DC | PRN

## 2019-07-02 MED ORDER — DEXAMETHASONE SODIUM PHOSPHATE 10 MG/ML IJ SOLN
10.0000 mg | Freq: Once | INTRAMUSCULAR | Status: AC
  Administered 2019-07-03: 10 mg via INTRAVENOUS
  Filled 2019-07-02: qty 1

## 2019-07-02 MED ORDER — TRANEXAMIC ACID-NACL 1000-0.7 MG/100ML-% IV SOLN
1000.0000 mg | INTRAVENOUS | Status: AC
  Administered 2019-07-02: 1000 mg via INTRAVENOUS
  Filled 2019-07-02: qty 100

## 2019-07-02 MED ORDER — TRIAMCINOLONE ACETONIDE 0.1 % EX CREA
1.0000 "application " | TOPICAL_CREAM | Freq: Two times a day (BID) | CUTANEOUS | Status: DC | PRN
  Filled 2019-07-02: qty 15

## 2019-07-02 MED ORDER — METHOCARBAMOL 500 MG PO TABS
500.0000 mg | ORAL_TABLET | Freq: Four times a day (QID) | ORAL | Status: DC | PRN
  Administered 2019-07-02 – 2019-07-04 (×5): 500 mg via ORAL
  Filled 2019-07-02 (×5): qty 1

## 2019-07-02 MED ORDER — SODIUM CHLORIDE 0.9 % IV SOLN: INTRAVENOUS | Status: DC

## 2019-07-02 MED ORDER — BENAZEPRIL HCL 20 MG PO TABS
20.0000 mg | ORAL_TABLET | Freq: Every day | ORAL | Status: DC
  Administered 2019-07-03 – 2019-07-04 (×2): 20 mg via ORAL
  Filled 2019-07-02 (×3): qty 1

## 2019-07-02 MED ORDER — CEFAZOLIN SODIUM-DEXTROSE 2-4 GM/100ML-% IV SOLN
2.0000 g | INTRAVENOUS | Status: AC
  Administered 2019-07-02: 2 g via INTRAVENOUS
  Filled 2019-07-02: qty 100

## 2019-07-02 MED ORDER — LEVOTHYROXINE SODIUM 100 MCG PO TABS
100.0000 ug | ORAL_TABLET | Freq: Every day | ORAL | Status: DC
  Administered 2019-07-03 – 2019-07-05 (×3): 100 ug via ORAL
  Filled 2019-07-02 (×3): qty 1

## 2019-07-02 MED ORDER — STERILE WATER FOR IRRIGATION IR SOLN
Status: DC | PRN
  Administered 2019-07-02: 1000 mL

## 2019-07-02 MED ORDER — PROPOFOL 10 MG/ML IV BOLUS
INTRAVENOUS | Status: AC
  Filled 2019-07-02: qty 20

## 2019-07-02 MED ORDER — POLYETHYLENE GLYCOL 3350 17 G PO PACK
17.0000 g | PACK | Freq: Two times a day (BID) | ORAL | Status: DC
  Administered 2019-07-03 – 2019-07-05 (×3): 17 g via ORAL
  Filled 2019-07-02 (×6): qty 1

## 2019-07-02 MED ORDER — ONDANSETRON HCL 4 MG/2ML IJ SOLN: 4.0000 mg | Freq: Once | INTRAMUSCULAR | Status: DC | PRN

## 2019-07-02 MED ORDER — METOCLOPRAMIDE HCL 5 MG PO TABS: 5.0000 mg | ORAL_TABLET | Freq: Three times a day (TID) | ORAL | Status: DC | PRN

## 2019-07-02 MED ORDER — TRANEXAMIC ACID-NACL 1000-0.7 MG/100ML-% IV SOLN
1000.0000 mg | Freq: Once | INTRAVENOUS | Status: AC
  Administered 2019-07-02: 1000 mg via INTRAVENOUS
  Filled 2019-07-02: qty 100

## 2019-07-02 MED ORDER — PHENOL 1.4 % MT LIQD: 1.0000 | OROMUCOSAL | Status: DC | PRN

## 2019-07-02 MED ORDER — MAGNESIUM CITRATE PO SOLN: 1.0000 | Freq: Once | ORAL | Status: DC | PRN

## 2019-07-02 MED ORDER — LIDOCAINE 2% (20 MG/ML) 5 ML SYRINGE
INTRAMUSCULAR | Status: AC
  Filled 2019-07-02: qty 5

## 2019-07-02 MED ORDER — FENTANYL CITRATE (PF) 250 MCG/5ML IJ SOLN
INTRAMUSCULAR | Status: AC
  Filled 2019-07-02: qty 5

## 2019-07-02 MED ORDER — DEXAMETHASONE SODIUM PHOSPHATE 10 MG/ML IJ SOLN
10.0000 mg | Freq: Once | INTRAMUSCULAR | Status: AC
  Administered 2019-07-02: 8 mg via INTRAVENOUS

## 2019-07-02 MED ORDER — RIVAROXABAN 10 MG PO TABS
10.0000 mg | ORAL_TABLET | ORAL | Status: DC
  Administered 2019-07-03 – 2019-07-05 (×3): 10 mg via ORAL
  Filled 2019-07-02 (×3): qty 1

## 2019-07-02 SURGICAL SUPPLY — 49 items
BAG DECANTER FOR FLEXI CONT (MISCELLANEOUS) IMPLANT
BAG ZIPLOCK 12X15 (MISCELLANEOUS) IMPLANT
BLADE SAG 18X100X1.27 (BLADE) ×3 IMPLANT
BLADE SURG SZ10 CARB STEEL (BLADE) ×6 IMPLANT
COVER PERINEAL POST (MISCELLANEOUS) ×3 IMPLANT
COVER SURGICAL LIGHT HANDLE (MISCELLANEOUS) ×3 IMPLANT
COVER WAND RF STERILE (DRAPES) IMPLANT
CUP ACET PINNACLE SECTR 50MM (Hips) ×1 IMPLANT
DERMABOND ADVANCED (GAUZE/BANDAGES/DRESSINGS) ×2
DERMABOND ADVANCED .7 DNX12 (GAUZE/BANDAGES/DRESSINGS) ×1 IMPLANT
DRAPE STERI IOBAN 125X83 (DRAPES) ×3 IMPLANT
DRAPE U-SHAPE 47X51 STRL (DRAPES) ×6 IMPLANT
DRESSING AQUACEL AG SP 3.5X10 (GAUZE/BANDAGES/DRESSINGS) ×1 IMPLANT
DRSG AQUACEL AG ADV 3.5X10 (GAUZE/BANDAGES/DRESSINGS) ×3 IMPLANT
DRSG AQUACEL AG SP 3.5X10 (GAUZE/BANDAGES/DRESSINGS) ×3
DURAPREP 26ML APPLICATOR (WOUND CARE) ×3 IMPLANT
ELECT BLADE TIP CTD 4 INCH (ELECTRODE) ×3 IMPLANT
ELECT REM PT RETURN 15FT ADLT (MISCELLANEOUS) ×3 IMPLANT
ELIMINATOR HOLE APEX DEPUY (Hips) ×3 IMPLANT
GLOVE BIO SURGEON STRL SZ 6 (GLOVE) ×6 IMPLANT
GLOVE BIOGEL PI IND STRL 6.5 (GLOVE) ×1 IMPLANT
GLOVE BIOGEL PI IND STRL 7.5 (GLOVE) ×1 IMPLANT
GLOVE BIOGEL PI IND STRL 8.5 (GLOVE) ×1 IMPLANT
GLOVE BIOGEL PI INDICATOR 6.5 (GLOVE) ×2
GLOVE BIOGEL PI INDICATOR 7.5 (GLOVE) ×2
GLOVE BIOGEL PI INDICATOR 8.5 (GLOVE) ×2
GLOVE ECLIPSE 8.0 STRL XLNG CF (GLOVE) ×6 IMPLANT
GLOVE ORTHO TXT STRL SZ7.5 (GLOVE) ×6 IMPLANT
GOWN STRL REUS W/TWL LRG LVL3 (GOWN DISPOSABLE) ×6 IMPLANT
GOWN STRL REUS W/TWL XL LVL3 (GOWN DISPOSABLE) ×3 IMPLANT
HEAD FEM STD 32X+1 STRL (Hips) ×3 IMPLANT
HOLDER FOLEY CATH W/STRAP (MISCELLANEOUS) ×3 IMPLANT
KIT TURNOVER KIT A (KITS) IMPLANT
LINER ACET PNNCL PLUS4 NEUTRAL (Hips) ×1 IMPLANT
PACK ANTERIOR HIP CUSTOM (KITS) ×3 IMPLANT
PENCIL SMOKE EVACUATOR (MISCELLANEOUS) IMPLANT
PINNACLE PLUS 4 NEUTRAL (Hips) ×3 IMPLANT
PINNACLE SECTOR CUP 50MM (Hips) ×3 IMPLANT
SCREW 6.5MMX30MM (Screw) ×3 IMPLANT
STEM FEMORAL SZ 5MM STD ACTIS (Stem) ×3 IMPLANT
SUT MNCRL AB 4-0 PS2 18 (SUTURE) ×3 IMPLANT
SUT STRATAFIX 0 PDS 27 VIOLET (SUTURE) ×3
SUT VIC AB 1 CT1 36 (SUTURE) ×9 IMPLANT
SUT VIC AB 2-0 CT1 27 (SUTURE) ×4
SUT VIC AB 2-0 CT1 TAPERPNT 27 (SUTURE) ×2 IMPLANT
SUTURE STRATFX 0 PDS 27 VIOLET (SUTURE) ×1 IMPLANT
TRAY FOLEY MTR SLVR 16FR STAT (SET/KITS/TRAYS/PACK) IMPLANT
WATER STERILE IRR 1000ML POUR (IV SOLUTION) ×3 IMPLANT
YANKAUER SUCT BULB TIP 10FT TU (MISCELLANEOUS) IMPLANT

## 2019-07-02 NOTE — Anesthesia Procedure Notes (Signed)
Procedure Name: Intubation Date/Time: 07/02/2019 2:14 PM Performed by: Anne Fu, CRNA Pre-anesthesia Checklist: Patient identified, Emergency Drugs available, Suction available, Patient being monitored and Timeout performed Patient Re-evaluated:Patient Re-evaluated prior to induction Oxygen Delivery Method: Circle system utilized Preoxygenation: Pre-oxygenation with 100% oxygen Induction Type: IV induction Ventilation: Mask ventilation without difficulty Laryngoscope Size: Mac and 4 Grade View: Grade I Tube type: Oral Tube size: 7.5 mm Number of attempts: 1 Airway Equipment and Method: Stylet Placement Confirmation: ETT inserted through vocal cords under direct vision,  positive ETCO2 and breath sounds checked- equal and bilateral Secured at: 20 cm Tube secured with: Tape Dental Injury: Teeth and Oropharynx as per pre-operative assessment

## 2019-07-02 NOTE — Interval H&P Note (Signed)
History and Physical Interval Note:  07/02/2019 12:57 PM  Bailey Ayala  has presented today for surgery, with the diagnosis of Right hip osteoarthritis.  The various methods of treatment have been discussed with the patient and family. After consideration of risks, benefits and other options for treatment, the patient has consented to  Procedure(s) with comments: TOTAL HIP ARTHROPLASTY ANTERIOR APPROACH (Right) - 70 mins as a surgical intervention.  The patient's history has been reviewed, patient examined, no change in status, stable for surgery.  I have reviewed the patient's chart and labs.  Questions were answered to the patient's satisfaction.     Mauri Pole

## 2019-07-02 NOTE — Op Note (Addendum)
NAME:  Bailey Ayala                ACCOUNT NO.: [id number]      MEDICAL RECORD NO.: [id number]      FACILITY:  [facility]      PHYSICIAN:  Mauri Pole  DATE OF BIRTH:  [birth year]     DATE OF PROCEDURE:  07/02/2019                                 OPERATIVE REPORT         PREOPERATIVE DIAGNOSIS: Right  hip osteoarthritis.      POSTOPERATIVE DIAGNOSIS:  Right hip osteoarthritis.      PROCEDURE:  Right total hip replacement through an anterior approach   utilizing DePuy THR system, component size 49mm pinnacle cup, a size 32+4 neutral   Altrex liner, a size 5 standard Actis stem with a 32+1 Articuleze metal head ball.      SURGEON:  Pietro Cassis. Alvan Dame, M.D.      ASSISTANT:  Danae Orleans, PA-C     ANESTHESIA:  General.      SPECIMENS:  None.      COMPLICATIONS:  None.      BLOOD LOSS:  700 cc     DRAINS:  None.      INDICATION OF THE PROCEDURE:  Bailey Ayala is a 79 y.o. female who had   presented to office for evaluation of right hip pain.  Radiographs revealed   progressive degenerative changes with bone-on-bone   articulation of the  hip joint, including subchondral cystic changes and osteophytes.  The patient had painful limited range of   motion significantly affecting their overall quality of life and function.  The patient was failing to    respond to conservative measures including medications and/or injections and activity modification and at this point was ready   to proceed with more definitive measures.  Consent was obtained for   benefit of pain relief.  Specific risks of infection, DVT, component   failure, dislocation, neurovascular injury, and need for revision surgery were reviewed in the office as well discussion of   the anterior versus posterior approach were reviewed.     PROCEDURE IN DETAIL:  The patient was brought to operative theater.   Once adequate anesthesia, preoperative antibiotics, 2 gm of Ancef, 1 gm of Tranexamic  Acid, and 10 mg of Decadron were administered, the patient was positioned supine on the [employer] table.  Once the patient was safely positioned with adequate padding of boney prominences we predraped out the hip, and used fluoroscopy to confirm orientation of the pelvis.      The right hip was then prepped and draped from proximal iliac crest to   mid thigh with a shower curtain technique.      Time-out was performed identifying the patient, planned procedure, and the appropriate extremity.     An incision was then made 2 cm lateral to the   anterior superior iliac spine extending over the orientation of the   tensor fascia lata muscle and sharp dissection was carried down to the   fascia of the muscle.      The fascia was then incised.  The muscle belly was identified and swept   laterally and retractor placed along the superior neck.  Following   cauterization of the circumflex vessels and removing some pericapsular  fat, a second cobra retractor was placed on the inferior neck.  A T-capsulotomy was made along the line of the   superior neck to the trochanteric fossa, then extended proximally and   distally.  Tag sutures were placed and the retractors were then placed   intracapsular.  We then identified the trochanteric fossa and   orientation of my neck cut and then made a neck osteotomy with the femur on traction.  The femoral   head was removed without difficulty or complication.  Traction was let   off and retractors were placed posterior and anterior around the   acetabulum.      The labrum and foveal tissue were debrided.  I began reaming with a 44 mm   reamer and reamed up to 49 mm reamer with good bony bed preparation and a 50 mm  cup was chosen.  The final 50 mm Pinnacle cup was then impacted under fluoroscopy to confirm the depth of penetration and orientation with respect to   Abduction and forward flexion.  A screw was placed into the ilium followed by the hole eliminator.   The final   32+4 neutral Altrex liner was impacted with good visualized rim fit.  The cup was positioned anatomically within the acetabular portion of the pelvis.      At this point, the femur was rolled to 100 degrees.  Further capsule was   released off the inferior aspect of the femoral neck.  I then   released the superior capsule proximally.  With the leg in a neutral position the hook was placed laterally   along the femur under the vastus lateralis origin and elevated manually and then held in position using the hook attachment on the bed.  The leg was then extended and adducted with the leg rolled to 100   degrees of external rotation.  Retractors were placed along the medial calcar and posteriorly over the greater trochanter.  Once the proximal femur was fully   exposed, I used a box osteotome to set orientation.  I then began   broaching with the starting chili pepper broach and passed this by hand and then broached up to 5.  With the 5 broach in place I chose a standard offset neck and did several trial reductions.  The offset was appropriate, leg lengths   appeared to be equal best matched with the +1 head ball trial confirmed radiographically.   Given these findings, I went ahead and dislocated the hip, repositioned all   retractors and positioned the right hip in the extended and abducted position.  The final 5 standard Actis stem was   chosen and it was impacted down to the level of neck cut.  Based on this   and the trial reductions, a final 32+1 Articuleze metal head ball was chosen and   impacted onto a clean and dry trunnion, and the hip was reduced.  The   hip had been irrigated throughout the case again at this point.  I did   reapproximate the superior capsular leaflet to the anterior leaflet   using #1 Vicryl.  The fascia of the   tensor fascia lata muscle was then reapproximated using #1 Vicryl and #0 Stratafix sutures.  The   remaining wound was closed with 2-0 Vicryl  and running 4-0 Monocryl.   The hip was cleaned, dried, and dressed sterilely using Dermabond and   Aquacel dressing.  The patient was then brought   to recovery room  in stable condition tolerating the procedure well.    Danae Orleans, PA-C was present for the entirety of the case involved from   preoperative positioning, perioperative retractor management, general   facilitation of the case, as well as primary wound closure as assistant.            Pietro Cassis Alvan Dame, M.D.        07/02/2019 2:23 PM

## 2019-07-02 NOTE — Transfer of Care (Signed)
Immediate Anesthesia Transfer of Care Note  Patient: Bailey Ayala  Procedure(s) Performed: Procedure(s) with comments: TOTAL HIP ARTHROPLASTY ANTERIOR APPROACH (Right) - 70 mins  Patient Location: PACU  Anesthesia Type:General  Level of Consciousness:  sedated, patient cooperative and responds to stimulation  Airway & Oxygen Therapy:Patient Spontanous Breathing and Patient connected to face mask oxgen  Post-op Assessment:  Report given to PACU RN and Post -op Vital signs reviewed and stable  Post vital signs:  Reviewed and stable  Last Vitals:  Vitals:   07/02/19 1205 07/02/19 1602  BP: (!) 147/52 (!) 177/60  Pulse: 68 61  Resp: 16 10  Temp: (!) 36.4 C 36.4 C  SpO2: 61% 683%    Complications: No apparent anesthesia complications

## 2019-07-02 NOTE — Anesthesia Preprocedure Evaluation (Addendum)
Anesthesia Evaluation  Patient identified by MRN, date of birth, ID band Patient awake    Reviewed: Allergy & Precautions, NPO status , Patient's Chart, lab work & pertinent test results, reviewed documented beta blocker date and time   Airway Mallampati: II  TM Distance: >3 FB Neck ROM: Full    Dental  (+) Upper Dentures, Partial Lower   Pulmonary COPD, former smoker,    Pulmonary exam normal breath sounds clear to auscultation       Cardiovascular hypertension, Pt. on medications and Pt. on home beta blockers + CAD, + Past MI, + Peripheral Vascular Disease and +CHF  Normal cardiovascular exam Rhythm:Regular Rate:Normal  ECG: rate 67. Sinus rhythm Incomplete left bundle branch block Left ventricular hypertrophy   Neuro/Psych Vertigo TIACVA, No Residual Symptoms negative psych ROS   GI/Hepatic negative GI ROS, Neg liver ROS,   Endo/Other  Hypothyroidism   Renal/GU Renal disease     Musculoskeletal  (+) Arthritis , Osteoarthritis,    Abdominal   Peds  Hematology  (+) anemia , HLD   Anesthesia Other Findings Right hip osteoarthritis  Reproductive/Obstetrics                            Anesthesia Physical Anesthesia Plan  ASA: III  Anesthesia Plan: General   Post-op Pain Management:    Induction: Intravenous  PONV Risk Score and Plan: 3 and Ondansetron, Dexamethasone and Treatment may vary due to age or medical condition  Airway Management Planned: Oral ETT  Additional Equipment:   Intra-op Plan:   Post-operative Plan: Extubation in OR  Informed Consent: I have reviewed the patients History and Physical, chart, labs and discussed the procedure including the risks, benefits and alternatives for the proposed anesthesia with the patient or authorized representative who has indicated his/her understanding and acceptance.     Dental advisory given  Plan Discussed with:  CRNA  Anesthesia Plan Comments:         Anesthesia Quick Evaluation

## 2019-07-02 NOTE — Telephone Encounter (Signed)
-----   Message from Venita Lick, NP sent at 07/01/2019  1:35 PM EST ----- 6 month follow-up please

## 2019-07-02 NOTE — Telephone Encounter (Signed)
Called Pt,pt In surgery daughter (per dpr) stated they would call to make this f.u appointment

## 2019-07-02 NOTE — Discharge Instructions (Signed)
INSTRUCTIONS AFTER JOINT REPLACEMENT   o Remove items at home which could result in a fall. This includes throw rugs or furniture in walking pathways o ICE to the affected joint every three hours while awake for 30 minutes at a time, for at least the first 3-5 days, and then as needed for pain and swelling.  Continue to use ice for pain and swelling. You may notice swelling that will progress down to the foot and ankle.  This is normal after surgery.  Elevate your leg when you are not up walking on it.   o Continue to use the breathing machine you got in the hospital (incentive spirometer) which will help keep your temperature down.  It is common for your temperature to cycle up and down following surgery, especially at night when you are not up moving around and exerting yourself.  The breathing machine keeps your lungs expanded and your temperature down.   DIET:  As you were doing prior to hospitalization, we recommend a well-balanced diet.  DRESSING / WOUND CARE / SHOWERING  Keep the surgical dressing until follow up.  The dressing is water proof, so you can shower without any extra covering.  IF THE DRESSING FALLS OFF or the wound gets wet inside, change the dressing with sterile gauze.  Please use good hand washing techniques before changing the dressing.  Do not use any lotions or creams on the incision until instructed by your surgeon.    ACTIVITY  o Increase activity slowly as tolerated, but follow the weight bearing instructions below.   o No driving for 6 weeks or until further direction given by your physician.  You cannot drive while taking narcotics.  o No lifting or carrying greater than 10 lbs. until further directed by your surgeon. o Avoid periods of inactivity such as sitting longer than an hour when not asleep. This helps prevent blood clots.  o You may return to work once you are authorized by your doctor.     WEIGHT BEARING   Weight bearing as tolerated with assist  device (walker, cane, etc) as directed, use it as long as suggested by your surgeon or therapist, typically at least 4-6 weeks.   EXERCISES  Results after joint replacement surgery are often greatly improved when you follow the exercise, range of motion and muscle strengthening exercises prescribed by your doctor. Safety measures are also important to protect the joint from further injury. Any time any of these exercises cause you to have increased pain or swelling, decrease what you are doing until you are comfortable again and then slowly increase them. If you have problems or questions, call your caregiver or physical therapist for advice.   Rehabilitation is important following a joint replacement. After just a few days of immobilization, the muscles of the leg can become weakened and shrink (atrophy).  These exercises are designed to build up the tone and strength of the thigh and leg muscles and to improve motion. Often times heat used for twenty to thirty minutes before working out will loosen up your tissues and help with improving the range of motion but do not use heat for the first two weeks following surgery (sometimes heat can increase post-operative swelling).   These exercises can be done on a training (exercise) mat, on the floor, on a table or on a bed. Use whatever works the best and is most comfortable for you.    Use music or television while you are exercising so that   the exercises are a pleasant break in your day. This will make your life better with the exercises acting as a break in your routine that you can look forward to.   Perform all exercises about fifteen times, three times per day or as directed.  You should exercise both the operative leg and the other leg as well.  Exercises include:   . Quad Sets - Tighten up the muscle on the front of the thigh (Quad) and hold for 5-10 seconds.   . Straight Leg Raises - With your knee straight (if you were given a brace, keep it on),  lift the leg to 60 degrees, hold for 3 seconds, and slowly lower the leg.  Perform this exercise against resistance later as your leg gets stronger.  . Leg Slides: Lying on your back, slowly slide your foot toward your buttocks, bending your knee up off the floor (only go as far as is comfortable). Then slowly slide your foot back down until your leg is flat on the floor again.  . Angel Wings: Lying on your back spread your legs to the side as far apart as you can without causing discomfort.  . Hamstring Strength:  Lying on your back, push your heel against the floor with your leg straight by tightening up the muscles of your buttocks.  Repeat, but this time bend your knee to a comfortable angle, and push your heel against the floor.  You may put a pillow under the heel to make it more comfortable if necessary.   A rehabilitation program following joint replacement surgery can speed recovery and prevent re-injury in the future due to weakened muscles. Contact your doctor or a physical therapist for more information on knee rehabilitation.    CONSTIPATION  Constipation is defined medically as fewer than three stools per week and severe constipation as less than one stool per week.  Even if you have a regular bowel pattern at home, your normal regimen is likely to be disrupted due to multiple reasons following surgery.  Combination of anesthesia, postoperative narcotics, change in appetite and fluid intake all can affect your bowels.   YOU MUST use at least one of the following options; they are listed in order of increasing strength to get the job done.  They are all available over the counter, and you may need to use some, POSSIBLY even all of these options:    Drink plenty of fluids (prune juice may be helpful) and high fiber foods Colace 100 mg by mouth twice a day  Senokot for constipation as directed and as needed Dulcolax (bisacodyl), take with full glass of water  Miralax (polyethylene glycol)  once or twice a day as needed.  If you have tried all these things and are unable to have a bowel movement in the first 3-4 days after surgery call either your surgeon or your primary doctor.    If you experience loose stools or diarrhea, hold the medications until you stool forms back up.  If your symptoms do not get better within 1 week or if they get worse, check with your doctor.  If you experience "the worst abdominal pain ever" or develop nausea or vomiting, please contact the office immediately for further recommendations for treatment.   ITCHING:  If you experience itching with your medications, try taking only a single pain pill, or even half a pain pill at a time.  You can also use Benadryl over the counter for itching or also to   help with sleep.  ° °TED HOSE STOCKINGS:  Use stockings on both legs until for at least 2 weeks or as directed by physician office. They may be removed at night for sleeping. ° °MEDICATIONS:  See your medication summary on the “After Visit Summary” that nursing will review with you.  You may have some home medications which will be placed on hold until you complete the course of blood thinner medication.  It is important for you to complete the blood thinner medication as prescribed. ° °PRECAUTIONS:  If you experience chest pain or shortness of breath - call 911 immediately for transfer to the hospital emergency department.  ° °If you develop a fever greater that 101 F, purulent drainage from wound, increased redness or drainage from wound, foul odor from the wound/dressing, or calf pain - CONTACT YOUR SURGEON.   °                                                °FOLLOW-UP APPOINTMENTS:  If you do not already have a post-op appointment, please call the office for an appointment to be seen by your surgeon.  Guidelines for how soon to be seen are listed in your “After Visit Summary”, but are typically between 1-4 weeks after surgery. ° °OTHER INSTRUCTIONS:  ° °Knee  Replacement:  Do not place pillow under knee, focus on keeping the knee straight while resting. CPM instructions: 0-90 degrees, 2 hours in the morning, 2 hours in the afternoon, and 2 hours in the evening. Place foam block, curve side up under heel at all times except when in CPM or when walking.  DO NOT modify, tear, cut, or change the foam block in any way. ° ° °DENTAL ANTIBIOTICS: ° °In most cases prophylactic antibiotics for Dental procdeures after total joint surgery are not necessary. ° °Exceptions are as follows: ° °1. History of prior total joint infection ° °2. Severely immunocompromised (Organ Transplant, cancer chemotherapy, Rheumatoid biologic °meds such as Humera) ° °3. Poorly controlled diabetes (A1C &gt; 8.0, blood glucose over 200) ° °If you have one of these conditions, contact your surgeon for an antibiotic prescription, prior to your °dental procedure. ° ° °MAKE SURE YOU:  °• Understand these instructions.  °• Get help right away if you are not doing well or get worse.  ° ° °Thank you for letting us be a part of your medical care team.  It is a privilege we respect greatly.  We hope these instructions will help you stay on track for a fast and full recovery!  ° ° ° °Information on my medicine - XARELTO® (Rivaroxaban) ° °  °Why was Xarelto® prescribed for you? °Xarelto® was prescribed for you to reduce the risk of blood clots forming after orthopedic surgery. The medical term for these abnormal blood clots is venous thromboembolism (VTE). ° °What do you need to know about xarelto® ? °Take your Xarelto® ONCE DAILY at the same time every day. °You may take it either with or without food. ° °If you have difficulty swallowing the tablet whole, you may crush it and mix in applesauce just prior to taking your dose. ° °Take Xarelto® exactly as prescribed by your doctor and DO NOT stop taking Xarelto® without talking to the doctor who prescribed the medication.  Stopping without other VTE prevention  medication to take the place of   Xarelto® may increase your risk of developing a clot. ° °After discharge, you should have regular check-up appointments with your healthcare provider that is prescribing your Xarelto®.   ° °What do you do if you miss a dose? °If you miss a dose, take it as soon as you remember on the same day then continue your regularly scheduled once daily regimen the next day. Do not take two doses of Xarelto® on the same day.  ° °Important Safety Information °A possible side effect of Xarelto® is bleeding. You should call your healthcare provider right away if you experience any of the following: °? Bleeding from an injury or your nose that does not stop. °? Unusual colored urine (red or dark brown) or unusual colored stools (red or black). °? Unusual bruising for unknown reasons. °? A serious fall or if you hit your head (even if there is no bleeding). ° °Some medicines may interact with Xarelto® and might increase your risk of bleeding while on Xarelto®. To help avoid this, consult your healthcare provider or pharmacist prior to using any new prescription or non-prescription medications, including herbals, vitamins, non-steroidal anti-inflammatory drugs (NSAIDs) and supplements. ° °This website has more information on Xarelto®: www.xarelto.com. ° ° ° °

## 2019-07-02 NOTE — Care Plan (Signed)
Ortho Bundle Case Management Note  Patient Details  Name: Bailey Ayala MRN: [id number] Date of Birth: 11-Jul-1940  R THA on 07-02-19 DCP:  Home with dtr.  2 story home with 1-3 ste. DME:  No needs.  Has a RW and 3-in-1. PT:  HEP                   DME Arranged:  N/A DME Agency:  NA  HH Arranged:  NA HH Agency:  NA  Additional Comments: Please contact me with any questions of if this plan should need to change.  Marianne Sofia, RN,CCM [facility]  [phone] 07/02/2019, 11:38 AM

## 2019-07-02 NOTE — Anesthesia Postprocedure Evaluation (Signed)
Anesthesia Post Note  Patient: Bailey Ayala  Procedure(s) Performed: TOTAL HIP ARTHROPLASTY ANTERIOR APPROACH (Right Hip)     Patient location during evaluation: PACU Anesthesia Type: General Level of consciousness: awake and alert Pain management: pain level controlled Vital Signs Assessment: post-procedure vital signs reviewed and stable Respiratory status: spontaneous breathing, nonlabored ventilation, respiratory function stable and patient connected to nasal cannula oxygen Cardiovascular status: blood pressure returned to baseline and stable Postop Assessment: no apparent nausea or vomiting Anesthetic complications: no    Last Vitals:  Vitals:   07/02/19 1700 07/02/19 1715  BP: (!) 149/58 (!) 161/61  Pulse: 62 61  Resp: 13 13  Temp:  (!) 36.3 C  SpO2: 93% 96%    Last Pain:  Vitals:   07/02/19 1700  TempSrc:   PainSc: 6                  Titus Drone P Lydiah Pong

## 2019-07-02 NOTE — Plan of Care (Signed)
  Problem: Education: Goal: Knowledge of the prescribed therapeutic regimen will improve Outcome: Progressing Goal: Understanding of discharge needs will improve Outcome: Progressing Goal: Individualized Educational Video(s) Outcome: Progressing   Problem: Activity: Goal: Ability to tolerate increased activity will improve Outcome: Progressing

## 2019-07-03 ENCOUNTER — Encounter: Payer: Self-pay | Admitting: *Deleted

## 2019-07-03 ENCOUNTER — Ambulatory Visit (INDEPENDENT_AMBULATORY_CARE_PROVIDER_SITE_OTHER): Payer: Medicare Other | Admitting: Nurse Practitioner

## 2019-07-03 ENCOUNTER — Encounter (INDEPENDENT_AMBULATORY_CARE_PROVIDER_SITE_OTHER): Payer: Medicare Other

## 2019-07-03 DIAGNOSIS — Z8673 Personal history of transient ischemic attack (TIA), and cerebral infarction without residual deficits: Secondary | ICD-10-CM | POA: Diagnosis not present

## 2019-07-03 DIAGNOSIS — E663 Overweight: Secondary | ICD-10-CM | POA: Diagnosis present

## 2019-07-03 DIAGNOSIS — D62 Acute posthemorrhagic anemia: Secondary | ICD-10-CM | POA: Diagnosis not present

## 2019-07-03 DIAGNOSIS — Z7982 Long term (current) use of aspirin: Secondary | ICD-10-CM | POA: Diagnosis not present

## 2019-07-03 DIAGNOSIS — N1831 Chronic kidney disease, stage 3a: Secondary | ICD-10-CM | POA: Diagnosis present

## 2019-07-03 DIAGNOSIS — J439 Emphysema, unspecified: Secondary | ICD-10-CM | POA: Diagnosis present

## 2019-07-03 DIAGNOSIS — M810 Age-related osteoporosis without current pathological fracture: Secondary | ICD-10-CM | POA: Diagnosis present

## 2019-07-03 DIAGNOSIS — D689 Coagulation defect, unspecified: Secondary | ICD-10-CM | POA: Diagnosis present

## 2019-07-03 DIAGNOSIS — E039 Hypothyroidism, unspecified: Secondary | ICD-10-CM | POA: Diagnosis present

## 2019-07-03 DIAGNOSIS — C9000 Multiple myeloma not having achieved remission: Secondary | ICD-10-CM | POA: Diagnosis present

## 2019-07-03 DIAGNOSIS — I252 Old myocardial infarction: Secondary | ICD-10-CM | POA: Diagnosis not present

## 2019-07-03 DIAGNOSIS — Z6829 Body mass index (BMI) 29.0-29.9, adult: Secondary | ICD-10-CM | POA: Diagnosis not present

## 2019-07-03 DIAGNOSIS — E1122 Type 2 diabetes mellitus with diabetic chronic kidney disease: Secondary | ICD-10-CM | POA: Diagnosis present

## 2019-07-03 DIAGNOSIS — M1611 Unilateral primary osteoarthritis, right hip: Secondary | ICD-10-CM | POA: Diagnosis present

## 2019-07-03 DIAGNOSIS — E1151 Type 2 diabetes mellitus with diabetic peripheral angiopathy without gangrene: Secondary | ICD-10-CM | POA: Diagnosis present

## 2019-07-03 DIAGNOSIS — I130 Hypertensive heart and chronic kidney disease with heart failure and stage 1 through stage 4 chronic kidney disease, or unspecified chronic kidney disease: Secondary | ICD-10-CM | POA: Diagnosis present

## 2019-07-03 DIAGNOSIS — M25751 Osteophyte, right hip: Secondary | ICD-10-CM | POA: Diagnosis present

## 2019-07-03 DIAGNOSIS — Z79899 Other long term (current) drug therapy: Secondary | ICD-10-CM | POA: Diagnosis not present

## 2019-07-03 DIAGNOSIS — E785 Hyperlipidemia, unspecified: Secondary | ICD-10-CM | POA: Diagnosis present

## 2019-07-03 DIAGNOSIS — I2510 Atherosclerotic heart disease of native coronary artery without angina pectoris: Secondary | ICD-10-CM | POA: Diagnosis present

## 2019-07-03 DIAGNOSIS — Z8616 Personal history of COVID-19: Secondary | ICD-10-CM | POA: Diagnosis not present

## 2019-07-03 DIAGNOSIS — Z79890 Hormone replacement therapy (postmenopausal): Secondary | ICD-10-CM | POA: Diagnosis not present

## 2019-07-03 DIAGNOSIS — Z9181 History of falling: Secondary | ICD-10-CM | POA: Diagnosis not present

## 2019-07-03 DIAGNOSIS — I509 Heart failure, unspecified: Secondary | ICD-10-CM | POA: Diagnosis present

## 2019-07-03 LAB — CBC
HCT: 22.5 % — ABNORMAL LOW (ref 36.0–46.0)
Hemoglobin: 7 g/dL — ABNORMAL LOW (ref 12.0–15.0)
MCH: 34.7 pg — ABNORMAL HIGH (ref 26.0–34.0)
MCHC: 31.1 g/dL (ref 30.0–36.0)
MCV: 111.4 fL — ABNORMAL HIGH (ref 80.0–100.0)
Platelets: 154 10*3/uL (ref 150–400)
RBC: 2.02 MIL/uL — ABNORMAL LOW (ref 3.87–5.11)
RDW: 13.3 % (ref 11.5–15.5)
WBC: 5.4 10*3/uL (ref 4.0–10.5)
nRBC: 0 % (ref 0.0–0.2)

## 2019-07-03 LAB — BASIC METABOLIC PANEL
Anion gap: 9 (ref 5–15)
BUN: 25 mg/dL — ABNORMAL HIGH (ref 8–23)
CO2: 21 mmol/L — ABNORMAL LOW (ref 22–32)
Calcium: 8.4 mg/dL — ABNORMAL LOW (ref 8.9–10.3)
Chloride: 105 mmol/L (ref 98–111)
Creatinine, Ser: 1.32 mg/dL — ABNORMAL HIGH (ref 0.44–1.00)
GFR calc Af Amer: 45 mL/min — ABNORMAL LOW (ref >60)
GFR calc non Af Amer: 39 mL/min — ABNORMAL LOW (ref >60)
Glucose, Bld: 134 mg/dL — ABNORMAL HIGH (ref 70–99)
Potassium: 5.1 mmol/L (ref 3.5–5.1)
Sodium: 135 mmol/L (ref 135–145)

## 2019-07-03 LAB — PREPARE RBC (CROSSMATCH)

## 2019-07-03 MED ORDER — FERROUS SULFATE 325 (65 FE) MG PO TABS: 325.0000 mg | ORAL_TABLET | Freq: Three times a day (TID) | ORAL | 0 refills | Status: AC

## 2019-07-03 MED ORDER — DOCUSATE SODIUM 100 MG PO CAPS: 100.0000 mg | ORAL_CAPSULE | Freq: Two times a day (BID) | ORAL | 0 refills | Status: DC

## 2019-07-03 MED ORDER — SODIUM CHLORIDE 0.9% IV SOLUTION: Freq: Once | INTRAVENOUS | Status: DC

## 2019-07-03 MED ORDER — HYDROCODONE-ACETAMINOPHEN 5-325 MG PO TABS: 1.0000 | ORAL_TABLET | ORAL | 0 refills | Status: DC | PRN

## 2019-07-03 MED ORDER — CHLORHEXIDINE GLUCONATE CLOTH 2 % EX PADS
6.0000 | MEDICATED_PAD | Freq: Every day | CUTANEOUS | Status: DC
  Administered 2019-07-03: 6 via TOPICAL

## 2019-07-03 MED ORDER — POLYETHYLENE GLYCOL 3350 17 G PO PACK: 17.0000 g | PACK | Freq: Two times a day (BID) | ORAL | 0 refills | Status: DC

## 2019-07-03 MED ORDER — RIVAROXABAN 10 MG PO TABS: 10.0000 mg | ORAL_TABLET | Freq: Every day | ORAL | 0 refills | Status: DC

## 2019-07-03 MED ORDER — METHOCARBAMOL 500 MG PO TABS: 500.0000 mg | ORAL_TABLET | Freq: Four times a day (QID) | ORAL | 0 refills | Status: DC | PRN

## 2019-07-03 MED ORDER — ASPIRIN 81 MG PO CHEW: 81.0000 mg | CHEWABLE_TABLET | Freq: Two times a day (BID) | ORAL | 0 refills | Status: AC

## 2019-07-03 NOTE — Progress Notes (Signed)
PT Cancellation Note  Patient Details Name: Bailey Ayala MRN: [id number] DOB: [birth year]   Cancelled Treatment:    Reason Eval/Treat Not Completed: Medical issues which prohibited therapy(Hgb 7.0, pt receiving blood, will attempt PT later today.)   Philomena Doheny PT 07/03/2019  Acute Rehabilitation Services Pager [phone] Office [phone]

## 2019-07-03 NOTE — Evaluation (Signed)
Physical Therapy Evaluation Patient Details Name: Bailey Ayala MRN: [id number] DOB: July 30, 1940 Today's Date: 07/03/2019   History of Present Illness  79 y.o. female admitted for R DA-THA. PMH of covid 04/11/19, L TSA '17, R TSA '18, L TKA, back surgery.  Clinical Impression  Pt is s/p THA resulting in the deficits listed below (see PT Problem List). Mod A for bed mobility and transfers, pt took a few pivotal steps to recliner. Activity tolerance limited by pain. Pt avoids WBing thru LUE 2* pain at IV insertion site, will bring platform RW for afternoon session.  Pt will benefit from skilled PT to increase their independence and safety with mobility to allow discharge to the venue listed below.      Follow Up Recommendations Follow surgeon's recommendation for DC plan and follow-up therapies (pt/daughter request HHPT)    Equipment Recommendations  None recommended by PT    Recommendations for Other Services       Precautions / Restrictions Precautions Precautions: Fall Restrictions Weight Bearing Restrictions: No RLE Weight Bearing: Weight bearing as tolerated      Mobility  Bed Mobility Overal bed mobility: Needs Assistance Bed Mobility: Supine to Sit     Supine to sit: Mod assist;HOB elevated     General bed mobility comments: assist to support RLE, pivot hips, and raise trunk, increased time 2* pain R hip, VCs for technique  Transfers Overall transfer level: Needs assistance Equipment used: Rolling walker (2 wheeled) Transfers: Sit to/from [facility] Sit to Stand: From elevated surface;Mod assist Stand pivot transfers: Min assist       General transfer comment: VCs hand placement, min A to rise, increased time to take a few pivotal steps to recliner from bed, VCs sequencing, pt avoided WBing thru LUE on RW 2* pain at IV insertion site L hand, will bring platform RW for afternoon session  Ambulation/Gait                Stairs             Wheelchair Mobility    Modified Rankin (Stroke Patients Only)       Balance Overall balance assessment: Needs assistance   Sitting balance-Leahy Scale: Fair     Standing balance support: Bilateral upper extremity supported Standing balance-Leahy Scale: Poor                               Pertinent Vitals/Pain Pain Assessment: Faces Faces Pain Scale: Hurts whole lot Pain Location: R hip with movement Pain Descriptors / Indicators: Sore;Grimacing;Guarding Pain Intervention(s): Limited activity within patient's tolerance;Monitored during session;Premedicated before session;Ice applied    Home Living Family/patient expects to be discharged to:: Private residence Living Arrangements: Children Available Help at Discharge: Family;Available 24 hours/day Type of Home: House Home Access: Stairs to enter Entrance Stairs-Rails: None Entrance Stairs-Number of Steps: 3 Home Layout: One level Home Equipment: Walker - 2 wheels;Cane - single point;Bedside commode      Prior Function Level of Independence: Independent with assistive device(s)               Hand Dominance        Extremity/Trunk Assessment   Upper Extremity Assessment Upper Extremity Assessment: Generalized weakness(pt avoided WBing with LUE on RW 2* pain at IV insertion site L hand)    Lower Extremity Assessment Lower Extremity Assessment: RLE deficits/detail RLE Deficits / Details: knee ext -3/5, hip 2/5 limited by pain, hip  flexion AAROM 30*, hip ABDuction AAROM 10* limited by pain RLE Sensation: WNL    Cervical / Trunk Assessment Cervical / Trunk Assessment: Normal  Communication   Communication: HOH(HOH with hearing aides)  Cognition Arousal/Alertness: Awake/alert Behavior During Therapy: WFL for tasks assessed/performed Overall Cognitive Status: Within Functional Limits for tasks assessed                                        General Comments       Exercises Total Joint Exercises Ankle Circles/Pumps: AROM;Both;10 reps;Supine Heel Slides: AAROM;Right;10 reps;Supine Hip ABduction/ADduction: AAROM;Right;10 reps;Supine   Assessment/Plan    PT Assessment Patient needs continued PT services  PT Problem List Decreased strength;Decreased range of motion;Decreased activity tolerance;Decreased balance;Decreased mobility;Pain;Decreased knowledge of use of DME       PT Treatment Interventions DME instruction;Gait training;Stair training;Functional mobility training;Therapeutic exercise;Therapeutic activities;Patient/family education    PT Goals (Current goals can be found in the Care Plan section)  Acute Rehab PT Goals Patient Stated Goal: walk PT Goal Formulation: With patient/family Time For Goal Achievement: 07/17/19 Potential to Achieve Goals: Fair    Frequency 7X/week   Barriers to discharge        Co-evaluation               AM-PAC PT "6 Clicks" Mobility  Outcome Measure Help needed turning from your back to your side while in a flat bed without using bedrails?: A Lot Help needed moving from lying on your back to sitting on the side of a flat bed without using bedrails?: A Lot Help needed moving to and from a bed to a chair (including a wheelchair)?: A Lot Help needed standing up from a chair using your arms (e.g., wheelchair or bedside chair)?: A Lot Help needed to walk in hospital room?: A Lot Help needed climbing 3-5 steps with a railing? : Total 6 Click Score: 11    End of Session Equipment Utilized During Treatment: Gait belt Activity Tolerance: Patient limited by pain;Patient limited by fatigue Patient left: in chair;with call bell/phone within reach;with family/visitor present Nurse Communication: Mobility status PT Visit Diagnosis: Muscle weakness (generalized) (M62.81);Difficulty in walking, not elsewhere classified (R26.2);Pain Pain - Right/Left: Right Pain - part of body: Hip    Time: 1128-1200 PT  Time Calculation (min) (ACUTE ONLY): 32 min   Charges:   PT Evaluation $PT Eval Low Complexity: 1 Low PT Treatments $Therapeutic Activity: 8-22 mins       Blondell Reveal Kistler PT 07/03/2019  Acute Rehabilitation Services Pager [phone] Office [phone]

## 2019-07-03 NOTE — Progress Notes (Signed)
PT Cancellation Note  Patient Details Name: TAMRE CASS MRN: [id number] DOB: 1940/06/13   Cancelled Treatment:    Reason Eval/Treat Not Completed: Fatigue/lethargy limiting ability to participate;Pain limiting ability to participate(pt in pain and fatigued from recent transfer from chair to bed. Will check back later today.)   Philomena Doheny PT 07/03/2019  Acute Rehabilitation Services Pager [phone] Office [phone]

## 2019-07-03 NOTE — Progress Notes (Signed)
     Subjective: 1 Day Post-Op Procedure(s) (LRB): TOTAL HIP ARTHROPLASTY ANTERIOR APPROACH (Right)   Patient reports pain as mild, pain controlled.  Dr. Alvan Dame discussed the procedure, findings and expectations moving forward.  Due to symptomatic anemia, a hemoglobin of 7.0 and various medical issues Dr. Alvan Dame discussed transfusing 2 units of blood.  Plan for discharge probably tomorrow due to underlying medical co-morbidities, blood transfusion and need for inpatient therapy to meet goal of being discharged home safely with family/caregiver.   Objective:   VITALS:   Vitals:   07/03/19 0136 07/03/19 0542  BP: (!) 128/50 118/63  Pulse: 66 68  Resp: 16 12  Temp: 97.8 F (36.6 C) 97.8 F (36.6 C)  SpO2: 100% 100%    Dorsiflexion/Plantar flexion intact Incision: dressing C/D/I No cellulitis present Compartment soft  LABS Recent Labs    07/03/19 0253  HGB 7.0*  HCT 22.5*  WBC 5.4  PLT 154    Recent Labs    07/03/19 0253  NA 135  K 5.1  BUN 25*  CREATININE 1.32*  GLUCOSE 134*     Assessment/Plan: 1 Day Post-Op Procedure(s) (LRB): TOTAL HIP ARTHROPLASTY ANTERIOR APPROACH (Right)  Advance diet Up with therapy D/C IV fluids Discharge home, probably tomorrow   ABLA  Ordered 2 unit of blood Also treated with iron and will observe  Overweight (BMI 25-29.9) Estimated body mass index is 29.18 kg/m as calculated from the following:   Height as of this encounter: 5\' 5"  (1.651 m).   Weight as of this encounter: 79.5 kg. Patient also counseled that weight may inhibit the healing process Patient counseled that losing weight will help with future health issues          Danae Orleans PA-C  [facility]  [place] [address], [place], [place] [address] Phone: [phone] [email or web address] Facebook  [employer]

## 2019-07-03 NOTE — Plan of Care (Signed)
  Problem: Education: Goal: Knowledge of the prescribed therapeutic regimen will improve Outcome: Progressing Goal: Understanding of discharge needs will improve Outcome: Progressing Goal: Individualized Educational Video(s) Outcome: Progressing   Problem: Activity: Goal: Ability to tolerate increased activity will improve Outcome: Progressing   Problem: Clinical Measurements: Goal: Postoperative complications will be avoided or minimized Outcome: Progressing   Problem: Pain Management: Goal: Pain level will decrease with appropriate interventions Outcome: Progressing   Problem: Skin Integrity: Goal: Will show signs of wound healing Outcome: Progressing   Problem: Education: Goal: Knowledge of General Education information will improve Description: Including pain rating scale, medication(s)/side effects and non-pharmacologic comfort measures Outcome: Progressing   Problem: Health Behavior/Discharge Planning: Goal: Ability to manage health-related needs will improve Outcome: Progressing   Problem: Clinical Measurements: Goal: Ability to maintain clinical measurements within normal limits will improve Outcome: Progressing Goal: Will remain free from infection Outcome: Progressing Goal: Diagnostic test results will improve Outcome: Progressing Goal: Respiratory complications will improve Outcome: Progressing Goal: Cardiovascular complication will be avoided Outcome: Progressing   Problem: Activity: Goal: Risk for activity intolerance will decrease Outcome: Progressing   Problem: Nutrition: Goal: Adequate nutrition will be maintained Outcome: Progressing   Problem: Coping: Goal: Level of anxiety will decrease Outcome: Progressing   Problem: Elimination: Goal: Will not experience complications related to bowel motility Outcome: Progressing Goal: Will not experience complications related to urinary retention Outcome: Progressing   Problem: Pain  Managment: Goal: General experience of comfort will improve Outcome: Progressing   Problem: Safety: Goal: Ability to remain free from injury will improve Outcome: Progressing   Problem: Skin Integrity: Goal: Risk for impaired skin integrity will decrease Outcome: Progressing

## 2019-07-03 NOTE — Progress Notes (Signed)
Physical Therapy Treatment Patient Details Name: Bailey Ayala MRN: [id number] DOB: 14-Jun-1940 Today's Date: 07/03/2019    History of Present Illness 79 y.o. female admitted for R DA-THA. PMH of covid 04/11/19, L TSA '17, R TSA '18, L TKA, back surgery.    PT Comments    Pt is in 8/10 pain with minimal movement of R hip, she had pain medication prior to PT session. She reported she's too fatigued and hurting too much to get out of bed. She agreed to bed level RLE exercises.   Follow Up Recommendations  Follow surgeon's recommendation for DC plan and follow-up therapies(pt/daughter request HHPT)     Equipment Recommendations  None recommended by PT    Recommendations for Other Services       Precautions / Restrictions Precautions Precautions: Fall Restrictions Weight Bearing Restrictions: No RLE Weight Bearing: Weight bearing as tolerated          Cognition Arousal/Alertness: Awake/alert Behavior During Therapy: WFL for tasks assessed/performed Overall Cognitive Status: Within Functional Limits for tasks assessed                                        Exercises Total Joint Exercises Ankle Circles/Pumps: AROM;Both;10 reps;Supine Quad Sets: AROM;Right;10 reps;Supine Short Arc Quad: AROM;Right;10 reps;Supine Heel Slides: AAROM;Right;10 reps;Supine Hip ABduction/ADduction: AAROM;Right;10 reps;Supine    General Comments        Pertinent Vitals/Pain Pain Assessment: Faces Pain Score: 8  Faces Pain Scale: Hurts whole lot Pain Location: R hip with movement Pain Descriptors / Indicators: Sharp Pain Intervention(s): Limited activity within patient's tolerance;Monitored during session;Premedicated before session;Ice applied    Home Living Family/patient expects to be discharged to:: Private residence Living Arrangements: Children Available Help at Discharge: Family;Available 24 hours/day Type of Home: House Home Access: Stairs to enter Entrance  Stairs-Rails: None Home Layout: One level Home Equipment: Environmental consultant - 2 wheels;Cane - single point;Bedside commode      Prior Function Level of Independence: Independent with assistive device(s)          PT Goals (current goals can now be found in the care plan section) Acute Rehab PT Goals Patient Stated Goal: walk PT Goal Formulation: With patient/family Time For Goal Achievement: 07/17/19 Potential to Achieve Goals: Fair Progress towards PT goals: Progressing toward goals    Frequency    7X/week      PT Plan Current plan remains appropriate    Co-evaluation              AM-PAC PT "6 Clicks" Mobility   Outcome Measure  Help needed turning from your back to your side while in a flat bed without using bedrails?: A Lot Help needed moving from lying on your back to sitting on the side of a flat bed without using bedrails?: A Lot Help needed moving to and from a bed to a chair (including a wheelchair)?: A Lot Help needed standing up from a chair using your arms (e.g., wheelchair or bedside chair)?: A Lot Help needed to walk in hospital room?: A Lot Help needed climbing 3-5 steps with a railing? : Total 6 Click Score: 11    End of Session Equipment Utilized During Treatment: Gait belt Activity Tolerance: Patient limited by pain;Patient limited by fatigue Patient left: with call bell/phone within reach;in bed;with bed alarm set Nurse Communication: Mobility status PT Visit Diagnosis: Muscle weakness (generalized) (M62.81);Difficulty in walking, not elsewhere classified (  R26.2);Pain Pain - Right/Left: Right Pain - part of body: Hip     Time: 1441-1454 PT Time Calculation (min) (ACUTE ONLY): 13 min  Charges:  $Therapeutic exercise: 8-22 mins

## 2019-07-03 NOTE — Plan of Care (Signed)
Plan of care for post op day 1 discussed with patient.   Reinforced risks of blood transfusion. Patient understands and is agreeable.    Will continue to monitor patient.     [place], Therapist, sports

## 2019-07-03 NOTE — Progress Notes (Signed)
Assisted patient to side of the bed to dangle, tolerated well, felt a little lightheaded but VSS, dressing CDI to right hip, neurovascular status intact, tolerating diet, foley patent for clear yellow urine, PT to see in the morning- will leave catheter in until they see her, medicated per MAR once back in bed, call bell in reach, bed alarm on,  will continue to monitor throughout shift.

## 2019-07-04 LAB — CBC
HCT: 30.6 % — ABNORMAL LOW (ref 36.0–46.0)
Hemoglobin: 9.5 g/dL — ABNORMAL LOW (ref 12.0–15.0)
MCH: 32.8 pg (ref 26.0–34.0)
MCHC: 31 g/dL (ref 30.0–36.0)
MCV: 105.5 fL — ABNORMAL HIGH (ref 80.0–100.0)
Platelets: 145 10*3/uL — ABNORMAL LOW (ref 150–400)
RBC: 2.9 MIL/uL — ABNORMAL LOW (ref 3.87–5.11)
RDW: 18.4 % — ABNORMAL HIGH (ref 11.5–15.5)
WBC: 4.9 10*3/uL (ref 4.0–10.5)
nRBC: 0 % (ref 0.0–0.2)

## 2019-07-04 LAB — TYPE AND SCREEN
ABO/RH(D): A POS
Antibody Screen: NEGATIVE
Unit division: 0
Unit division: 0

## 2019-07-04 LAB — BPAM RBC
Blood Product Expiration Date: 202104032359
Blood Product Expiration Date: 202104032359
ISSUE DATE / TIME: 202103101016
ISSUE DATE / TIME: 202103101332
Unit Type and Rh: 6200
Unit Type and Rh: 6200

## 2019-07-04 LAB — BASIC METABOLIC PANEL
Anion gap: 8 (ref 5–15)
BUN: 23 mg/dL (ref 8–23)
CO2: 20 mmol/L — ABNORMAL LOW (ref 22–32)
Calcium: 8.4 mg/dL — ABNORMAL LOW (ref 8.9–10.3)
Chloride: 105 mmol/L (ref 98–111)
Creatinine, Ser: 1.34 mg/dL — ABNORMAL HIGH (ref 0.44–1.00)
GFR calc Af Amer: 44 mL/min — ABNORMAL LOW (ref >60)
GFR calc non Af Amer: 38 mL/min — ABNORMAL LOW (ref >60)
Glucose, Bld: 115 mg/dL — ABNORMAL HIGH (ref 70–99)
Potassium: 4.2 mmol/L (ref 3.5–5.1)
Sodium: 133 mmol/L — ABNORMAL LOW (ref 135–145)

## 2019-07-04 NOTE — TOC Initial Note (Signed)
Transition of Care [facility]) - Initial/Assessment Note    Patient Details  Name: Bailey Ayala MRN: [id number] Date of Birth: July 13, 1940  [facility] CM/SW Contact:    Leeroy Cha, RN Phone Number: 07/04/2019, 9:29 AM  Clinical Narrative:                 hhc through [facility].  Expected Discharge Plan: [place] Barriers to Discharge: No Barriers Identified   Patient Goals and CMS Choice Patient states their goals for this hospitalization and ongoing recovery are:: to go home CMS Medicare.gov Compare Post Acute Care list provided to:: Patient Choice offered to / list presented to : Patient  Expected Discharge Plan and Services Expected Discharge Plan: [place]   Discharge Planning Services: CM Consult Post Acute Care Choice: [place] arrangements for the past 2 months: Single Family Home Expected Discharge Date: 07/03/19               DME Arranged: N/A DME Agency: NA       HH Arranged: PT [place] Agency: [facility] (formerly [employer]) Date [place]: 07/04/19 Time [place]: [phone]    Prior Living Arrangements/Services Living arrangements for the past 2 months: [place] with:: Self Patient language and need for interpreter reviewed:: No Do you feel safe going back to the place where you live?: Yes      Need for Family Participation in Patient Care: Yes (Comment) Care giver support system in place?: Yes (comment)   Criminal Activity/Legal Involvement Pertinent to Current Situation/Hospitalization: No - Comment as needed  Activities of Daily Living Home Assistive Devices/Equipment: Hearing aid, Eyeglasses, Dentures (specify type), Blood pressure cuff, Cane (specify quad or straight), Walker (specify type), Raised toilet seat with rails, Shower chair with back ADL Screening (condition at time of admission) Patient's cognitive ability adequate to  safely complete daily activities?: Yes Is the patient deaf or have difficulty hearing?: Yes Does the patient have difficulty seeing, even when wearing glasses/contacts?: No Does the patient have difficulty concentrating, remembering, or making decisions?: No Patient able to express need for assistance with ADLs?: Yes Does the patient have difficulty dressing or bathing?: No Independently performs ADLs?: Yes (appropriate for developmental age) Does the patient have difficulty walking or climbing stairs?: Yes Weakness of Legs: Both Weakness of Arms/Hands: None  Permission Sought/Granted                  Emotional Assessment Appearance:: Appears stated age     Orientation: : Oriented to Self, Oriented to Place, Oriented to  Time, Oriented to Situation Alcohol / Substance Use: Not Applicable Psych Involvement: No (comment)  Admission diagnosis:  Status post total hip replacement, right [W97.948] Patient Active Problem List   Diagnosis Date Noted  . Overweight (BMI 25.0-29.9) 07/03/2019  . Acute blood loss anemia 07/03/2019  . S/P right THA, AA 07/02/2019  . History of COVID-19 04/11/2019  . Skin callus 04/08/2019  . Anemia in chronic kidney disease 01/18/2019  . Chronic kidney disease, stage III (moderate) 01/18/2019  . Multiple myeloma ([place]) 01/18/2019  . History of CVA (cerebrovascular accident) 11/28/2018  . CAD (coronary artery disease) 11/28/2018  . Abnormal stress test   . Pain due to onychomycosis of toenails of both feet 10/18/2018  . Left lower lobe pulmonary nodule 08/18/2017  . Abdominal aortic aneurysm (AAA) without rupture ([place]) 08/18/2017  . Goals of care, counseling/discussion  08/18/2017  . Benign hypertensive heart and CKD, stage 3 (GFR 30-59), w CHF ([place]) 05/29/2017  . Drug-induced constipation 11/07/2016  . Primary osteoarthritis involving multiple joints 11/07/2016  . Simple chronic bronchitis ([place]) 11/07/2016  . Advanced care planning/counseling  discussion 11/02/2016  . S/P shoulder replacement 01/14/2016  . Hypothyroid 11/04/2014  . Hyperlipidemia   . Ulcerated varicose veins of leg, right ([place]) 09/30/2014  . Peripheral arterial occlusive disease ([place]) 09/30/2014   PCP:  Guadalupe Maple, MD Pharmacy:   [facility] [place] [address] Phone: [phone] Fax: [phone]  [id number] [address], [place] [address] Phone: [phone] Fax: [phone]  [place], [place] [facility] [address] [place] [address] [place] [address] Phone: [phone] Fax: [phone]     Social Determinants of Health ([place]) Interventions    Readmission Risk Interventions No flowsheet data found.

## 2019-07-04 NOTE — Progress Notes (Signed)
Physical Therapy Treatment Patient Details Name: Bailey Ayala MRN: [id number] DOB: Dec 09, 1940 Today's Date: 07/04/2019    History of Present Illness 79 y.o. female admitted for R DA-THA. PMH of covid 04/11/19, L TSA '17, R TSA '18, L TKA, back surgery.    PT Comments    Pt is progressing with mobility, she tolerated increased ambulation distance of 49' with L PFRW. Performed R THA exercises with min assist. Will plan to do stair training tomorrow morning, then expect she'll be ready to DC home from PT standpoint.    Follow Up Recommendations  Follow surgeon's recommendation for DC plan and follow-up therapies(pt/daughter request HHPT)     Equipment Recommendations  None recommended by PT    Recommendations for Other Services       Precautions / Restrictions Precautions Precautions: Fall Restrictions Weight Bearing Restrictions: No RLE Weight Bearing: Weight bearing as tolerated    Mobility  Bed Mobility Overal bed mobility: Needs Assistance Bed Mobility: Sit to Supine       Sit to supine: Min assist   General bed mobility comments: up in the bathroom  Transfers Overall transfer level: Needs assistance Equipment used: Rolling walker (2 wheeled) Transfers: Sit to/from Stand Sit to Stand: From elevated surface;Supervision         General transfer comment: sit to stand from 3 in 1, VCs hand placement  Ambulation/Gait Ambulation/Gait assistance: Min guard Gait Distance (Feet): 80 Feet Assistive device: Rolling walker (2 wheeled);Left platform walker Gait Pattern/deviations: Step-to pattern;Decreased stride length;Decreased weight shift to right Gait velocity: decr   General Gait Details: steady with L PFRW (using platform 2* pain from IV insertion site in L wrist), VCs sequencing and positioning in RW   Stairs             Wheelchair Mobility    Modified Rankin (Stroke Patients Only)       Balance Overall balance assessment: Needs  assistance   Sitting balance-Leahy Scale: Fair     Standing balance support: Bilateral upper extremity supported Standing balance-Leahy Scale: Poor                              Cognition Arousal/Alertness: Awake/alert Behavior During Therapy: WFL for tasks assessed/performed Overall Cognitive Status: No family/caregiver present to determine baseline cognitive functioning                                 General Comments: some ST memory deficits -doesn't recall doing THA exercises this morning or yesterday; but overall WFL, follows commands, is pleasant and oriented      Exercises Total Joint Exercises Ankle Circles/Pumps: AROM;Both;10 reps;Supine Quad Sets: AROM;Right;Supine;5 reps Short Arc Quad: AROM;Right;10 reps;Supine Heel Slides: AAROM;Right;10 reps;Supine Hip ABduction/ADduction: AAROM;Right;10 reps;Supine Long Arc Quad: AROM;Right;10 reps;Seated    General Comments        Pertinent Vitals/Pain Pain Score: 6  Pain Location: R buttock Pain Descriptors / Indicators: Sore Pain Intervention(s): Limited activity within patient's tolerance;Monitored during session;Premedicated before session;Ice applied    Home Living                      Prior Function            PT Goals (current goals can now be found in the care plan section) Acute Rehab PT Goals Patient Stated Goal: walk PT Goal Formulation: With patient Time For Goal  Achievement: 07/17/19 Potential to Achieve Goals: Fair Progress towards PT goals: Progressing toward goals    Frequency    7X/week      PT Plan Current plan remains appropriate    Co-evaluation              AM-PAC PT "6 Clicks" Mobility   Outcome Measure  Help needed turning from your back to your side while in a flat bed without using bedrails?: A Little Help needed moving from lying on your back to sitting on the side of a flat bed without using bedrails?: A Lot Help needed moving to and  from a bed to a chair (including a wheelchair)?: A Little Help needed standing up from a chair using your arms (e.g., wheelchair or bedside chair)?: A Little Help needed to walk in hospital room?: A Little Help needed climbing 3-5 steps with a railing? : A Lot 6 Click Score: 16    End of Session Equipment Utilized During Treatment: Gait belt Activity Tolerance: Patient tolerated treatment well Patient left: with call bell/phone within reach;in chair;with chair alarm set Nurse Communication: Mobility status PT Visit Diagnosis: Muscle weakness (generalized) (M62.81);Difficulty in walking, not elsewhere classified (R26.2);Pain Pain - Right/Left: Right Pain - part of body: Hip     Time: 1146-1209 PT Time Calculation (min) (ACUTE ONLY): 23 min  Charges:  $Gait Training: 8-22 mins $Therapeutic Exercise: 8-22 mins                     Blondell Reveal Kistler PT 07/04/2019  Acute Rehabilitation Services Pager [phone] Office [phone]

## 2019-07-04 NOTE — Progress Notes (Signed)
Physical Therapy Treatment Patient Details Name: Bailey Ayala MRN: [id number] DOB: [birth year] Today's Date: 07/04/2019    History of Present Illness 79 y.o. female admitted for R DA-THA. PMH of covid 04/11/19, L TSA '17, R TSA '18, L TKA, back surgery.    PT Comments    Much improved activity tolerance today. Pt ambulated 25' with L PFRW, distance limited by fatigue,  no loss of balance. Performed R THA exercises with min assist. 5/10 pain R anterior hip.   Follow Up Recommendations  Follow surgeon's recommendation for DC plan and follow-up therapies(pt/daughter request HHPT)     Equipment Recommendations  None recommended by PT    Recommendations for Other Services       Precautions / Restrictions Precautions Precautions: Fall Restrictions Weight Bearing Restrictions: No RLE Weight Bearing: Weight bearing as tolerated    Mobility  Bed Mobility Overal bed mobility: Needs Assistance Bed Mobility: Sit to Supine       Sit to supine: Min assist   General bed mobility comments: min A for LEs into bed, VCs for technique  Transfers Overall transfer level: Needs assistance Equipment used: Rolling walker (2 wheeled) Transfers: Sit to/from Stand Sit to Stand: From elevated surface;Supervision         General transfer comment: sit to stand from 3 in 1, VCs hand placement  Ambulation/Gait Ambulation/Gait assistance: Min guard Gait Distance (Feet): 60 Feet Assistive device: Rolling walker (2 wheeled);Left platform walker Gait Pattern/deviations: Step-to pattern;Decreased stride length;Decreased weight shift to right Gait velocity: decr   General Gait Details: steady with L PFRW (using platform 2* pain from IV insertion site in L wrist), VCs sequencing and positioning in RW   Stairs             Wheelchair Mobility    Modified Rankin (Stroke Patients Only)       Balance Overall balance assessment: Needs assistance   Sitting balance-Leahy Scale:  Fair     Standing balance support: Bilateral upper extremity supported Standing balance-Leahy Scale: Poor                              Cognition Arousal/Alertness: Awake/alert Behavior During Therapy: WFL for tasks assessed/performed Overall Cognitive Status: Within Functional Limits for tasks assessed                                        Exercises Total Joint Exercises Ankle Circles/Pumps: AROM;Both;10 reps;Supine Quad Sets: AROM;Right;Supine;5 reps Heel Slides: AAROM;Right;10 reps;Supine Hip ABduction/ADduction: AAROM;Right;10 reps;Supine    General Comments        Pertinent Vitals/Pain Pain Score: 5  Pain Location: R anterior hip/thigh "in the muscle" Pain Descriptors / Indicators: Sore;Guarding;Grimacing Pain Intervention(s): Limited activity within patient's tolerance;Monitored during session;Premedicated before session;Ice applied    Home Living                      Prior Function            PT Goals (current goals can now be found in the care plan section) Acute Rehab PT Goals Patient Stated Goal: walk PT Goal Formulation: With patient Time For Goal Achievement: 07/17/19 Potential to Achieve Goals: Fair Progress towards PT goals: Progressing toward goals    Frequency    7X/week      PT Plan Current plan remains appropriate  Co-evaluation              AM-PAC PT "6 Clicks" Mobility   Outcome Measure  Help needed turning from your back to your side while in a flat bed without using bedrails?: A Little Help needed moving from lying on your back to sitting on the side of a flat bed without using bedrails?: A Lot Help needed moving to and from a bed to a chair (including a wheelchair)?: A Little Help needed standing up from a chair using your arms (e.g., wheelchair or bedside chair)?: A Little Help needed to walk in hospital room?: A Little Help needed climbing 3-5 steps with a railing? : A Lot 6 Click  Score: 16    End of Session Equipment Utilized During Treatment: Gait belt Activity Tolerance: Patient limited by pain;Patient limited by fatigue Patient left: with call bell/phone within reach;in bed;with bed alarm set Nurse Communication: Mobility status PT Visit Diagnosis: Muscle weakness (generalized) (M62.81);Difficulty in walking, not elsewhere classified (R26.2);Pain Pain - Right/Left: Right Pain - part of body: Hip     Time: 7972-8206 PT Time Calculation (min) (ACUTE ONLY): 24 min  Charges:  $Gait Training: 8-22 mins $Therapeutic Exercise: 8-22 mins                     Blondell Reveal Kistler PT 07/04/2019  Acute Rehabilitation Services Pager [phone] Office [phone]

## 2019-07-04 NOTE — Care Management Important Message (Signed)
Important Message  Patient Details IM Letter given to Velva Harman RN Case Manager to present to the Patient Name: Bailey Ayala MRN: [id number] Date of Birth: [birth year]   Medicare Important Message Given:  Yes     Kerin Salen 07/04/2019, 5:00 PM

## 2019-07-04 NOTE — Progress Notes (Signed)
Patient ID: Bailey Ayala, female   DOB: 1941-03-18, 79 y.o.   MRN: [id number] Subjective: 2 Days Post-Op Procedure(s) (LRB): TOTAL HIP ARTHROPLASTY ANTERIOR APPROACH (Right)    Patient reports pain as moderate.  Limited progress yesterday with therapy.  Anxious about at home activity and ADLs  Objective:   VITALS:   Vitals:   07/03/19 1936 07/04/19 0613  BP: (!) 130/58 (!) 126/57  Pulse: 87 78  Resp: 17 18  Temp: 98.2 F (36.8 C) 98.4 F (36.9 C)  SpO2: 95% 90%    Neurovascular intact Incision: dressing C/D/I - Right hip  LABS Recent Labs    07/03/19 0253 07/04/19 0247  HGB 7.0* 9.5*  HCT 22.5* 30.6*  WBC 5.4 4.9  PLT 154 145*    Recent Labs    07/03/19 0253 07/04/19 0247  NA 135 133*  K 5.1 4.2  BUN 25* 23  CREATININE 1.32* 1.34*  GLUCOSE 134* 115*    No results for input(s): LABPT, INR in the last 72 hours.   Assessment/Plan: 2 Days Post-Op Procedure(s) (LRB): TOTAL HIP ARTHROPLASTY ANTERIOR APPROACH (Right)   Up with therapy Plan for discharge tomorrow Discharge home with home health   Based on pre-op condition in addition to acute on chronic anemia requiring transfusion we will plan to perform in house therapy again today, arrange for HHPT and plan for discharge to home tomorrow  ABLA in setting of pre-operative anemia of underlying medical conditions She received 2 units of PRBCs and will remain on IRON supplementation through discharge Repeat labs in am prior to discharge

## 2019-07-05 LAB — CBC
HCT: 28.6 % — ABNORMAL LOW (ref 36.0–46.0)
Hemoglobin: 9 g/dL — ABNORMAL LOW (ref 12.0–15.0)
MCH: 33.2 pg (ref 26.0–34.0)
MCHC: 31.5 g/dL (ref 30.0–36.0)
MCV: 105.5 fL — ABNORMAL HIGH (ref 80.0–100.0)
Platelets: 170 10*3/uL (ref 150–400)
RBC: 2.71 MIL/uL — ABNORMAL LOW (ref 3.87–5.11)
RDW: 17.5 % — ABNORMAL HIGH (ref 11.5–15.5)
WBC: 4.8 10*3/uL (ref 4.0–10.5)
nRBC: 0 % (ref 0.0–0.2)

## 2019-07-05 NOTE — Progress Notes (Signed)
Physical Therapy Treatment Patient Details Name: Bailey Ayala MRN: [id number] DOB: 11/27/1940 Today's Date: 07/05/2019    History of Present Illness 79 y.o. female admitted for R DA-THA. PMH of covid 04/11/19, L TSA '17, R TSA '18, L TKA, back surgery.    PT Comments    Pt ambulated in hallway and practiced safe stair technique.  Pt also educated on self assist with gait belt for bed mobility per request.  Pt agreeable to perform exercises (HEP handout already in room) upon return home. Provided stair handout for pt (daughter not present and to assist pt home).  Pt feels ready for d/c today.    Follow Up Recommendations  Follow surgeon's recommendation for DC plan and follow-up therapies(would benefit from HHPT)     Equipment Recommendations  None recommended by PT    Recommendations for Other Services       Precautions / Restrictions Precautions Precautions: Fall Restrictions RLE Weight Bearing: Weight bearing as tolerated    Mobility  Bed Mobility Overal bed mobility: Needs Assistance Bed Mobility: Supine to Sit;Sit to Supine     Supine to sit: Supervision Sit to supine: Supervision   General bed mobility comments: pt requested for bed mobility education with use of gait belt to self assist; pt provided with cues for technique, pt required increased and effort however no assist  Transfers Overall transfer level: Needs assistance Equipment used: Rolling walker (2 wheeled) Transfers: Sit to/from Stand Sit to Stand: Min guard;Supervision         General transfer comment: verbal cues for hand placement and LE positioning for pain control  Ambulation/Gait Ambulation/Gait assistance: Min guard Gait Distance (Feet): 120 Feet Assistive device: Rolling walker (2 wheeled) Gait Pattern/deviations: Step-to pattern;Decreased weight shift to right;Antalgic Gait velocity: decr   General Gait Details: verbal cues for sequence, RW positioning,  posture   Stairs Stairs: Yes Stairs assistance: Min guard Stair Management: Step to pattern;Backwards;With walker Number of Stairs: 2 General stair comments: verbal cues for sequence, safety, RW positioning; performed x2; handout provided   Wheelchair Mobility    Modified Rankin (Stroke Patients Only)       Balance                                            Cognition Arousal/Alertness: Awake/alert Behavior During Therapy: WFL for tasks assessed/performed Overall Cognitive Status: Within Functional Limits for tasks assessed                                        Exercises      General Comments        Pertinent Vitals/Pain Pain Assessment: Faces Faces Pain Scale: Hurts little more Pain Location: R hip Pain Descriptors / Indicators: Aching;Sore Pain Intervention(s): Repositioned;Monitored during session    Home Living                      Prior Function            PT Goals (current goals can now be found in the care plan section) Progress towards PT goals: Progressing toward goals    Frequency    7X/week      PT Plan Current plan remains appropriate    Co-evaluation  AM-PAC PT "6 Clicks" Mobility   Outcome Measure  Help needed turning from your back to your side while in a flat bed without using bedrails?: A Little Help needed moving from lying on your back to sitting on the side of a flat bed without using bedrails?: A Little Help needed moving to and from a bed to a chair (including a wheelchair)?: A Little Help needed standing up from a chair using your arms (e.g., wheelchair or bedside chair)?: A Little Help needed to walk in hospital room?: A Little Help needed climbing 3-5 steps with a railing? : A Little 6 Click Score: 18    End of Session Equipment Utilized During Treatment: Gait belt Activity Tolerance: Patient tolerated treatment well Patient left: with call bell/phone  within reach(bathroom, pt aware to use pull cord, secretary aware pt in bathroom)   PT Visit Diagnosis: Muscle weakness (generalized) (M62.81);Difficulty in walking, not elsewhere classified (R26.2)     Time: 0037-0488 PT Time Calculation (min) (ACUTE ONLY): 24 min  Charges:  $Gait Training: 23-37 mins                     Arlyce Dice, DPT Acute Rehabilitation Services Office: [phone]  Trena Platt 07/05/2019, 12:55 PM

## 2019-07-05 NOTE — Plan of Care (Signed)
  Problem: Education: Goal: Understanding of discharge needs will improve Outcome: Progressing   Problem: Activity: Goal: Ability to tolerate increased activity will improve Outcome: Progressing   Problem: Pain Management: Goal: Pain level will decrease with appropriate interventions Outcome: Progressing   Problem: Skin Integrity: Goal: Will show signs of wound healing Outcome: Progressing   Problem: Health Behavior/Discharge Planning: Goal: Ability to manage health-related needs will improve Outcome: Progressing   Problem: Clinical Measurements: Goal: Will remain free from infection Outcome: Progressing Goal: Diagnostic test results will improve Outcome: Progressing   Problem: Activity: Goal: Risk for activity intolerance will decrease Outcome: Progressing   Problem: Elimination: Goal: Will not experience complications related to bowel motility Outcome: Progressing   Problem: Pain Managment: Goal: General experience of comfort will improve Outcome: Progressing   Problem: Safety: Goal: Ability to remain free from injury will improve Outcome: Progressing   Problem: Skin Integrity: Goal: Risk for impaired skin integrity will decrease Outcome: Progressing

## 2019-07-05 NOTE — Progress Notes (Signed)
  Progress Note   Date: 07/04/2019  Patient Name: Bailey Ayala        MRN#: [id number]  Clarification of diagnosis:  CKD Stage IIIa

## 2019-07-05 NOTE — Progress Notes (Signed)
     Subjective: 3 Days Post-Op Procedure(s) (LRB): TOTAL HIP ARTHROPLASTY ANTERIOR APPROACH (Right)   Patient reports pain as mild, pain controlled.  No events throughout the night.  Dr. Alvan Dame discussed using Xarelto then ASA for DVT/PE prophylaxis. Patient will be ready to be discharged home.      Objective:   VITALS:   Vitals:   07/04/19 2117 07/05/19 0601  BP: (!) 135/58 (!) 125/53  Pulse: 82 69  Resp: 16 20  Temp: 98.1 F (36.7 C) 98.3 F (36.8 C)  SpO2: 93% 93%    Dorsiflexion/Plantar flexion intact Incision: dressing C/D/I No cellulitis present Compartment soft  LABS Recent Labs    07/03/19 0253 07/04/19 0247 07/05/19 0249  HGB 7.0* 9.5* 9.0*  HCT 22.5* 30.6* 28.6*  WBC 5.4 4.9 4.8  PLT 154 145* 170    Recent Labs    07/03/19 0253 07/04/19 0247  NA 135 133*  K 5.1 4.2  BUN 25* 23  CREATININE 1.32* 1.34*  GLUCOSE 134* 115*     Assessment/Plan: 3 Days Post-Op Procedure(s) (LRB): TOTAL HIP ARTHROPLASTY ANTERIOR APPROACH (Right) Up with therapy Discharge home with home health  Follow up in 2 weeks at [facility] Follow up with OLIN,Vershawn Westrup D in 2 weeks.  Contact information:  [facility] [address] [place] [phone]           Danae Orleans PA-C  [facility]  [place] [address], [place], [place], [place] [address] Phone: [phone] [email or web address] Facebook  [employer]

## 2019-07-06 DIAGNOSIS — D631 Anemia in chronic kidney disease: Secondary | ICD-10-CM | POA: Diagnosis not present

## 2019-07-06 DIAGNOSIS — E1151 Type 2 diabetes mellitus with diabetic peripheral angiopathy without gangrene: Secondary | ICD-10-CM | POA: Diagnosis not present

## 2019-07-06 DIAGNOSIS — Z96611 Presence of right artificial shoulder joint: Secondary | ICD-10-CM | POA: Diagnosis not present

## 2019-07-06 DIAGNOSIS — I8390 Asymptomatic varicose veins of unspecified lower extremity: Secondary | ICD-10-CM | POA: Diagnosis not present

## 2019-07-06 DIAGNOSIS — M150 Primary generalized (osteo)arthritis: Secondary | ICD-10-CM | POA: Diagnosis not present

## 2019-07-06 DIAGNOSIS — I509 Heart failure, unspecified: Secondary | ICD-10-CM | POA: Diagnosis not present

## 2019-07-06 DIAGNOSIS — I2510 Atherosclerotic heart disease of native coronary artery without angina pectoris: Secondary | ICD-10-CM | POA: Diagnosis not present

## 2019-07-06 DIAGNOSIS — I252 Old myocardial infarction: Secondary | ICD-10-CM | POA: Diagnosis not present

## 2019-07-06 DIAGNOSIS — Z8616 Personal history of COVID-19: Secondary | ICD-10-CM | POA: Diagnosis not present

## 2019-07-06 DIAGNOSIS — E785 Hyperlipidemia, unspecified: Secondary | ICD-10-CM | POA: Diagnosis not present

## 2019-07-06 DIAGNOSIS — N183 Chronic kidney disease, stage 3 unspecified: Secondary | ICD-10-CM | POA: Diagnosis not present

## 2019-07-06 DIAGNOSIS — I714 Abdominal aortic aneurysm, without rupture: Secondary | ICD-10-CM | POA: Diagnosis not present

## 2019-07-06 DIAGNOSIS — I447 Left bundle-branch block, unspecified: Secondary | ICD-10-CM | POA: Diagnosis not present

## 2019-07-06 DIAGNOSIS — E039 Hypothyroidism, unspecified: Secondary | ICD-10-CM | POA: Diagnosis not present

## 2019-07-06 DIAGNOSIS — D689 Coagulation defect, unspecified: Secondary | ICD-10-CM | POA: Diagnosis not present

## 2019-07-06 DIAGNOSIS — C9000 Multiple myeloma not having achieved remission: Secondary | ICD-10-CM | POA: Diagnosis not present

## 2019-07-06 DIAGNOSIS — I130 Hypertensive heart and chronic kidney disease with heart failure and stage 1 through stage 4 chronic kidney disease, or unspecified chronic kidney disease: Secondary | ICD-10-CM | POA: Diagnosis not present

## 2019-07-06 DIAGNOSIS — Z96641 Presence of right artificial hip joint: Secondary | ICD-10-CM | POA: Diagnosis not present

## 2019-07-06 DIAGNOSIS — E1122 Type 2 diabetes mellitus with diabetic chronic kidney disease: Secondary | ICD-10-CM | POA: Diagnosis not present

## 2019-07-06 DIAGNOSIS — M810 Age-related osteoporosis without current pathological fracture: Secondary | ICD-10-CM | POA: Diagnosis not present

## 2019-07-06 DIAGNOSIS — R911 Solitary pulmonary nodule: Secondary | ICD-10-CM | POA: Diagnosis not present

## 2019-07-06 DIAGNOSIS — K5903 Drug induced constipation: Secondary | ICD-10-CM | POA: Diagnosis not present

## 2019-07-06 DIAGNOSIS — Z96612 Presence of left artificial shoulder joint: Secondary | ICD-10-CM | POA: Diagnosis not present

## 2019-07-06 DIAGNOSIS — J439 Emphysema, unspecified: Secondary | ICD-10-CM | POA: Diagnosis not present

## 2019-07-06 DIAGNOSIS — Z471 Aftercare following joint replacement surgery: Secondary | ICD-10-CM | POA: Diagnosis not present

## 2019-07-08 ENCOUNTER — Ambulatory Visit: Payer: Medicare Other | Admitting: Podiatry

## 2019-07-09 DIAGNOSIS — Z471 Aftercare following joint replacement surgery: Secondary | ICD-10-CM | POA: Diagnosis not present

## 2019-07-09 DIAGNOSIS — N183 Chronic kidney disease, stage 3 unspecified: Secondary | ICD-10-CM | POA: Diagnosis not present

## 2019-07-09 DIAGNOSIS — E1122 Type 2 diabetes mellitus with diabetic chronic kidney disease: Secondary | ICD-10-CM | POA: Diagnosis not present

## 2019-07-09 DIAGNOSIS — D631 Anemia in chronic kidney disease: Secondary | ICD-10-CM | POA: Diagnosis not present

## 2019-07-09 DIAGNOSIS — I509 Heart failure, unspecified: Secondary | ICD-10-CM | POA: Diagnosis not present

## 2019-07-09 DIAGNOSIS — I130 Hypertensive heart and chronic kidney disease with heart failure and stage 1 through stage 4 chronic kidney disease, or unspecified chronic kidney disease: Secondary | ICD-10-CM | POA: Diagnosis not present

## 2019-07-11 DIAGNOSIS — I130 Hypertensive heart and chronic kidney disease with heart failure and stage 1 through stage 4 chronic kidney disease, or unspecified chronic kidney disease: Secondary | ICD-10-CM | POA: Diagnosis not present

## 2019-07-11 DIAGNOSIS — E1122 Type 2 diabetes mellitus with diabetic chronic kidney disease: Secondary | ICD-10-CM | POA: Diagnosis not present

## 2019-07-11 DIAGNOSIS — Z471 Aftercare following joint replacement surgery: Secondary | ICD-10-CM | POA: Diagnosis not present

## 2019-07-11 DIAGNOSIS — N183 Chronic kidney disease, stage 3 unspecified: Secondary | ICD-10-CM | POA: Diagnosis not present

## 2019-07-11 DIAGNOSIS — I509 Heart failure, unspecified: Secondary | ICD-10-CM | POA: Diagnosis not present

## 2019-07-11 DIAGNOSIS — D631 Anemia in chronic kidney disease: Secondary | ICD-10-CM | POA: Diagnosis not present

## 2019-07-11 NOTE — Discharge Summary (Signed)
Physician Discharge Summary  Patient ID: Bailey Ayala MRN: [id number] DOB/AGE: 1940-07-02 79 y.o.  Admit date: 07/02/2019 Discharge date: 07/05/2019   Procedures:  Procedure(s) (LRB): TOTAL HIP ARTHROPLASTY ANTERIOR APPROACH (Right)  Attending Physician:  Dr. Paralee Cancel   Admission Diagnoses:   Right hip primary OA / pain  Discharge Diagnoses:  Principal Problem:   S/P right THA, AA Active Problems:   Overweight (BMI 25.0-29.9)   Acute blood loss anemia  Past Medical History:  Diagnosis Date  . AAA (abdominal aortic aneurysm) ([place])    The largest aortic measurement is 4.0 cm on 05/31/19  . Allergy 1980  . Anemia 09-2017  . Arthritis   . Bleeding from varicose vein   . Blood dyscrasia    BLEEDS EASILY   . Blood transfusion without reported diagnosis 2011 or 12  . Cataract ?  . CHF (congestive heart failure) ([place]) ?  . CKD (chronic kidney disease), stage III    Moderate  . Clotting disorder ([place]) ?  Marland Kitchen COPD (chronic obstructive pulmonary disease) ([place])   . Coronary artery disease   . Diabetes mellitus without complication ([place])    patient denies  . Diffuse cystic mastopathy   . Emphysema of lung ([place]) ?  Marland Kitchen Emphysema of lung ([place])   . Heartburn   . Hyperlipidemia   . Hypertension   . Hypothyroidism   . Incomplete left bundle branch block (LBBB) 06/22/2019   Noted on EKG  . L4-L5 disc bulge   . Multiple myeloma ([place])   . Myocardial infarction ([place]) 2019  . Osteoporosis, post-menopausal   . PAD (peripheral artery disease) ([place])   . Renal insufficiency   . Shortness of breath dyspnea    WITH EXERTION   . Stroke ([place])    TIA     2016     WAS FOUND ON SCAN ORDERED FROM NEUROL  . Thyroid disease   . TIA (transient ischemic attack) 2016   left hand weakness  . Ulnar nerve compression, left   . Vertigo 06/22/2019   doing much today 06/28/2019    HPI:    Bailey Ayala, 79 y.o. female, has a history of pain and functional disability in the right hip(s)  due to arthritis and patient has failed non-surgical conservative treatments for greater than 12 weeks to include NSAID's and/or analgesics, corticosteriod injections, use of assistive devices and activity modification.  Onset of symptoms was gradual starting ~1.5 years ago with gradually worsening course since that time.The patient noted no past surgery on the right hip(s).  Patient currently rates pain in the right hip at 9 out of 10 with activity. Patient has night pain, worsening of pain with activity and weight bearing, trendelenberg gait, pain that interfers with activities of daily living and pain with passive range of motion. Patient has evidence of periarticular osteophytes and joint space narrowing by imaging studies. This condition presents safety issues increasing the risk of falls. There is no current active infection.  Risks, benefits and expectations were discussed with the patient.  Risks including but not limited to the risk of anesthesia, blood clots, nerve damage, blood vessel damage, failure of the prosthesis, infection and up to and including death.  Patient understand the risks, benefits and expectations and wishes to proceed with surgery.   PCP: Guadalupe Maple, MD   Discharged Condition: good  Hospital Course:  Patient underwent the above stated procedure on 07/02/2019. Patient tolerated the procedure well and brought to the recovery room  in good condition and subsequently to the floor.  POD #1 BP: 118/63 ; Pulse: 68 ; Temp: 97.8 F (36.6 C) ; Resp: 12 Patient reports pain as mild, pain controlled.  Dr. Alvan Dame discussed the procedure, findings and expectations moving forward.  Due to symptomatic anemia, a hemoglobin of 7.0 and various medical issues Dr. Alvan Dame discussed transfusing 2 units of blood.  Plan for discharge probably tomorrowdue to underlying medical co-morbidities, blood transfusion and need for inpatient therapy to meet goal of being discharged home safely with  family/caregiver.  LABS  Basename    HGB     7.0  HCT     22.5   POD #2  BP: 126/57 ; Pulse: 78 ; Temp: 98.4 F (36.9 C) ; Resp: 18 Patient reports pain as moderate.  Limited progress yesterday with therapy.  Anxious about at home activity and ADLs. Neurovascular intact and incision: dressing C/D/I.   LABS  Basename    HGB     9.5  HCT     30.6   POD #3  BP: 125/53 ; Pulse: 69 ; Temp: 98.3 F (36.8 C) ; Resp: 20 Patient reports pain as mild, pain controlled.  No events throughout the night.  Dr. Alvan Dame discussed using Xarelto then ASA for DVT/PE prophylaxis. Patient will be ready to be discharged home. Dorsiflexion/plantar flexion intact, incision: dressing C/D/I, no cellulitis present and compartment soft.   LABS  Basename    HGB     9.0  HCT     28.6    Discharge Exam: General appearance: alert, cooperative and no distress Extremities: Homans sign is negative, no sign of DVT, no edema, redness or tenderness in the calves or thighs and no ulcers, gangrene or trophic changes  Disposition: Home with follow up in 2 weeks   Follow-up Information    Danae Orleans, PA-C. Go on 07/17/2019.   Specialty: Orthopedic Surgery Why: You are scheduled for a post-operative appointment on 07-17-19 at 2:45 pm.  Contact information: [address] [place] [address] [phone]        [facility] Follow up.   Specialty: Home Health Services Contact information: [place] [address] [phone]           Discharge Instructions    Call MD / Call 911   Complete by: As directed    If you experience chest pain or shortness of breath, CALL 911 and be transported to the hospital emergency room.  If you develope a fever above 101 F, pus (white drainage) or increased drainage or redness at the wound, or calf pain, call your surgeon's office.   Change dressing   Complete by: As directed    Maintain surgical dressing until follow up in  the clinic. If the edges start to pull up, may reinforce with tape. If the dressing is no longer working, may remove and cover with gauze and tape, but must keep the area dry and clean.  Call with any questions or concerns.   Constipation Prevention   Complete by: As directed    Drink plenty of fluids.  Prune juice may be helpful.  You may use a stool softener, such as Colace (over the counter) 100 mg twice a day.  Use MiraLax (over the counter) for constipation as needed.   Diet - low sodium heart healthy   Complete by: As directed    Discharge instructions   Complete by: As directed    Maintain  surgical dressing until follow up in the clinic. If the edges start to pull up, may reinforce with tape. If the dressing is no longer working, may remove and cover with gauze and tape, but must keep the area dry and clean.  Follow up in 2 weeks at [facility]. Call with any questions or concerns.   Increase activity slowly as tolerated   Complete by: As directed    Weight bearing as tolerated with assist device (walker, cane, etc) as directed, use it as long as suggested by your surgeon or therapist, typically at least 4-6 weeks.   TED hose   Complete by: As directed    Use stockings (TED hose) for 2 weeks on both leg(s).  You may remove them at night for sleeping.      Allergies as of 07/05/2019      Reactions   Oxycodone Other (See Comments)   Made her feel "shaky"   Codeine Other (See Comments)   hallucinations    Simvastatin Diarrhea, Nausea Only      Medication List    STOP taking these medications   acetaminophen 500 MG tablet Commonly known as: TYLENOL   aspirin EC 81 MG tablet Replaced by: aspirin 81 MG chewable tablet   traMADol 50 MG tablet Commonly known as: ULTRAM     TAKE these medications   aspirin 81 MG chewable tablet Commonly known as: Aspirin Childrens Chew 1 tablet (81 mg total) by mouth 2 (two) times daily. Start the day after finishing the Xarelto. Take for 4  weeks, then resume regular dose. Replaces: aspirin EC 81 MG tablet   atorvastatin 40 MG tablet Commonly known as: LIPITOR Take 1 tablet (40 mg total) by mouth daily.   benazepril 20 MG tablet Commonly known as: LOTENSIN Take 1 tablet (20 mg total) by mouth daily.   docusate sodium 100 MG capsule Commonly known as: Colace Take 1 capsule (100 mg total) by mouth 2 (two) times daily. Notes to patient: Stool softner   ferrous sulfate 325 (65 FE) MG tablet Commonly known as: FerrouSul Take 1 tablet (325 mg total) by mouth 3 (three) times daily with meals for 14 days. What changed: when to take this Notes to patient: Iron   HYDROcodone-acetaminophen 5-325 MG tablet Commonly known as: Norco Take 1-2 tablets by mouth every 4 (four) hours as needed for moderate pain or severe pain. Notes to patient: Pain medication   levothyroxine 100 MCG tablet Commonly known as: SYNTHROID Take 1 tablet (100 mcg total) by mouth daily.   meclizine 25 MG tablet Commonly known as: ANTIVERT Take 1 tablet (25 mg total) by mouth 2 (two) times daily as needed for dizziness.   methocarbamol 500 MG tablet Commonly known as: Robaxin Take 1 tablet (500 mg total) by mouth every 6 (six) hours as needed for muscle spasms.   metoprolol tartrate 50 MG tablet Commonly known as: LOPRESSOR Take 1 tablet (50 mg total) by mouth 2 (two) times daily.   multivitamin with minerals Tabs tablet Take 1 tablet by mouth daily.   polyethylene glycol 17 g packet Commonly known as: MIRALAX / GLYCOLAX Take 17 g by mouth 2 (two) times daily.   rivaroxaban 10 MG Tabs tablet Commonly known as: Xarelto Take 1 tablet (10 mg total) by mouth daily.   triamcinolone cream 0.1 % Commonly known as: KENALOG Apply 1 application topically 2 (two) times daily. What changed:   when to take this  reasons to take this  Discharge Care Instructions  (From admission, onward)         Start     Ordered   07/03/19  0000  Change dressing    Comments: Maintain surgical dressing until follow up in the clinic. If the edges start to pull up, may reinforce with tape. If the dressing is no longer working, may remove and cover with gauze and tape, but must keep the area dry and clean.  Call with any questions or concerns.   07/03/19 7939           Signed: [place] P[place]. Linkin Vizzini   PA-C  07/11/2019, 8:38 AM

## 2019-07-12 DIAGNOSIS — N183 Chronic kidney disease, stage 3 unspecified: Secondary | ICD-10-CM | POA: Diagnosis not present

## 2019-07-12 DIAGNOSIS — Z471 Aftercare following joint replacement surgery: Secondary | ICD-10-CM | POA: Diagnosis not present

## 2019-07-12 DIAGNOSIS — E1122 Type 2 diabetes mellitus with diabetic chronic kidney disease: Secondary | ICD-10-CM | POA: Diagnosis not present

## 2019-07-12 DIAGNOSIS — I509 Heart failure, unspecified: Secondary | ICD-10-CM | POA: Diagnosis not present

## 2019-07-12 DIAGNOSIS — D631 Anemia in chronic kidney disease: Secondary | ICD-10-CM | POA: Diagnosis not present

## 2019-07-12 DIAGNOSIS — I130 Hypertensive heart and chronic kidney disease with heart failure and stage 1 through stage 4 chronic kidney disease, or unspecified chronic kidney disease: Secondary | ICD-10-CM | POA: Diagnosis not present

## 2019-07-12 NOTE — Telephone Encounter (Signed)
Pt has apt on 01/01/2020

## 2019-07-15 ENCOUNTER — Encounter: Payer: Self-pay | Admitting: Nurse Practitioner

## 2019-07-15 DIAGNOSIS — Z471 Aftercare following joint replacement surgery: Secondary | ICD-10-CM | POA: Diagnosis not present

## 2019-07-15 DIAGNOSIS — D631 Anemia in chronic kidney disease: Secondary | ICD-10-CM | POA: Diagnosis not present

## 2019-07-15 DIAGNOSIS — E1122 Type 2 diabetes mellitus with diabetic chronic kidney disease: Secondary | ICD-10-CM | POA: Diagnosis not present

## 2019-07-15 DIAGNOSIS — I509 Heart failure, unspecified: Secondary | ICD-10-CM | POA: Diagnosis not present

## 2019-07-15 DIAGNOSIS — N183 Chronic kidney disease, stage 3 unspecified: Secondary | ICD-10-CM | POA: Diagnosis not present

## 2019-07-15 DIAGNOSIS — I130 Hypertensive heart and chronic kidney disease with heart failure and stage 1 through stage 4 chronic kidney disease, or unspecified chronic kidney disease: Secondary | ICD-10-CM | POA: Diagnosis not present

## 2019-07-16 MED ORDER — ONDANSETRON HCL 4 MG/2ML IJ SOLN
INTRAMUSCULAR | Status: DC | PRN
  Administered 2019-07-02: 4 mg via INTRAVENOUS

## 2019-07-17 ENCOUNTER — Encounter: Payer: Self-pay | Admitting: Emergency Medicine

## 2019-07-17 ENCOUNTER — Emergency Department
Admission: EM | Admit: 2019-07-17 | Discharge: 2019-07-17 | Disposition: A | Discharge status: Home / Self Care | Payer: Medicare Other | Attending: Student in an Organized Health Care Education/Training Program | Admitting: Student in an Organized Health Care Education/Training Program

## 2019-07-17 ENCOUNTER — Other Ambulatory Visit: Payer: Self-pay

## 2019-07-17 DIAGNOSIS — N183 Chronic kidney disease, stage 3 unspecified: Secondary | ICD-10-CM | POA: Insufficient documentation

## 2019-07-17 DIAGNOSIS — I130 Hypertensive heart and chronic kidney disease with heart failure and stage 1 through stage 4 chronic kidney disease, or unspecified chronic kidney disease: Secondary | ICD-10-CM | POA: Diagnosis not present

## 2019-07-17 DIAGNOSIS — Z7901 Long term (current) use of anticoagulants: Secondary | ICD-10-CM | POA: Diagnosis not present

## 2019-07-17 DIAGNOSIS — R531 Weakness: Secondary | ICD-10-CM | POA: Diagnosis not present

## 2019-07-17 DIAGNOSIS — Z96611 Presence of right artificial shoulder joint: Secondary | ICD-10-CM | POA: Diagnosis not present

## 2019-07-17 DIAGNOSIS — E1122 Type 2 diabetes mellitus with diabetic chronic kidney disease: Secondary | ICD-10-CM | POA: Insufficient documentation

## 2019-07-17 DIAGNOSIS — I252 Old myocardial infarction: Secondary | ICD-10-CM | POA: Insufficient documentation

## 2019-07-17 DIAGNOSIS — Z87891 Personal history of nicotine dependence: Secondary | ICD-10-CM | POA: Insufficient documentation

## 2019-07-17 DIAGNOSIS — I2510 Atherosclerotic heart disease of native coronary artery without angina pectoris: Secondary | ICD-10-CM | POA: Diagnosis not present

## 2019-07-17 DIAGNOSIS — I509 Heart failure, unspecified: Secondary | ICD-10-CM | POA: Insufficient documentation

## 2019-07-17 DIAGNOSIS — Z8579 Personal history of other malignant neoplasms of lymphoid, hematopoietic and related tissues: Secondary | ICD-10-CM | POA: Insufficient documentation

## 2019-07-17 DIAGNOSIS — Z96612 Presence of left artificial shoulder joint: Secondary | ICD-10-CM | POA: Insufficient documentation

## 2019-07-17 DIAGNOSIS — Z8616 Personal history of COVID-19: Secondary | ICD-10-CM | POA: Diagnosis not present

## 2019-07-17 DIAGNOSIS — Z8673 Personal history of transient ischemic attack (TIA), and cerebral infarction without residual deficits: Secondary | ICD-10-CM | POA: Diagnosis not present

## 2019-07-17 DIAGNOSIS — Z96652 Presence of left artificial knee joint: Secondary | ICD-10-CM | POA: Insufficient documentation

## 2019-07-17 DIAGNOSIS — R42 Dizziness and giddiness: Secondary | ICD-10-CM | POA: Diagnosis not present

## 2019-07-17 DIAGNOSIS — Z96641 Presence of right artificial hip joint: Secondary | ICD-10-CM | POA: Insufficient documentation

## 2019-07-17 DIAGNOSIS — J449 Chronic obstructive pulmonary disease, unspecified: Secondary | ICD-10-CM | POA: Insufficient documentation

## 2019-07-17 DIAGNOSIS — E039 Hypothyroidism, unspecified: Secondary | ICD-10-CM | POA: Diagnosis not present

## 2019-07-17 DIAGNOSIS — Z79899 Other long term (current) drug therapy: Secondary | ICD-10-CM | POA: Insufficient documentation

## 2019-07-17 DIAGNOSIS — I10 Essential (primary) hypertension: Secondary | ICD-10-CM | POA: Diagnosis not present

## 2019-07-17 LAB — BASIC METABOLIC PANEL
Anion gap: 10 (ref 5–15)
BUN: 27 mg/dL — ABNORMAL HIGH (ref 8–23)
CO2: 24 mmol/L (ref 22–32)
Calcium: 10 mg/dL (ref 8.9–10.3)
Chloride: 103 mmol/L (ref 98–111)
Creatinine, Ser: 1.34 mg/dL — ABNORMAL HIGH (ref 0.44–1.00)
GFR calc Af Amer: 44 mL/min — ABNORMAL LOW (ref >60)
GFR calc non Af Amer: 38 mL/min — ABNORMAL LOW (ref >60)
Glucose, Bld: 133 mg/dL — ABNORMAL HIGH (ref 70–99)
Potassium: 4.7 mmol/L (ref 3.5–5.1)
Sodium: 137 mmol/L (ref 135–145)

## 2019-07-17 LAB — CBC
HCT: 31.1 % — ABNORMAL LOW (ref 36.0–46.0)
Hemoglobin: 9.9 g/dL — ABNORMAL LOW (ref 12.0–15.0)
MCH: 32.5 pg (ref 26.0–34.0)
MCHC: 31.8 g/dL (ref 30.0–36.0)
MCV: 102 fL — ABNORMAL HIGH (ref 80.0–100.0)
Platelets: 359 10*3/uL (ref 150–400)
RBC: 3.05 MIL/uL — ABNORMAL LOW (ref 3.87–5.11)
RDW: 15.5 % (ref 11.5–15.5)
WBC: 5.1 10*3/uL (ref 4.0–10.5)
nRBC: 0 % (ref 0.0–0.2)

## 2019-07-17 LAB — HEPATIC FUNCTION PANEL
ALT: 12 U/L (ref 0–44)
AST: 19 U/L (ref 15–41)
Albumin: 3.8 g/dL (ref 3.5–5.0)
Alkaline Phosphatase: 84 U/L (ref 38–126)
Bilirubin, Direct: <0.1 mg/dL (ref 0.0–0.2)
Total Bilirubin: 0.5 mg/dL (ref 0.3–1.2)
Total Protein: 8.9 g/dL — ABNORMAL HIGH (ref 6.5–8.1)

## 2019-07-17 LAB — LIPASE, BLOOD: Lipase: 39 U/L (ref 11–51)

## 2019-07-17 MED ORDER — ONDANSETRON HCL 4 MG/2ML IJ SOLN
4.0000 mg | Freq: Once | INTRAMUSCULAR | Status: AC
  Administered 2019-07-17: 4 mg via INTRAVENOUS
  Filled 2019-07-17: qty 2

## 2019-07-17 MED ORDER — MECLIZINE HCL 25 MG PO TABS
25.0000 mg | ORAL_TABLET | Freq: Once | ORAL | Status: AC
  Administered 2019-07-17: 25 mg via ORAL
  Filled 2019-07-17: qty 1

## 2019-07-17 MED ORDER — MECLIZINE HCL 25 MG PO TABS: 25.0000 mg | ORAL_TABLET | Freq: Two times a day (BID) | ORAL | 0 refills | Status: AC | PRN

## 2019-07-17 MED ORDER — ONDANSETRON HCL 4 MG PO TABS: 4.0000 mg | ORAL_TABLET | Freq: Every day | ORAL | 0 refills | Status: DC | PRN

## 2019-07-17 MED ORDER — SODIUM CHLORIDE 0.9 % IV BOLUS
500.0000 mL | Freq: Once | INTRAVENOUS | Status: AC
  Administered 2019-07-17: 500 mL via INTRAVENOUS

## 2019-07-17 MED ORDER — SODIUM CHLORIDE 0.9% FLUSH
3.0000 mL | Freq: Once | INTRAVENOUS | Status: AC
  Administered 2019-07-17: 3 mL via INTRAVENOUS

## 2019-07-17 NOTE — ED Provider Notes (Signed)
[facility] Emergency Department Provider Note    First MD Initiated Contact with Patient 07/17/19 2045     (approximate)  I have reviewed the triage vital signs and the nursing notes.   HISTORY  Chief Complaint Nausea, Emesis, and Dizziness    HPI Bailey Ayala is a 79 y.o. female   the below listed past medical history presents to the ER for evaluation of positional dizziness associate with nausea and vomiting.  States that when she turns her head she feels like the room is spinning.  States that she had similar symptoms roughly 1 month ago.  States that she took a meclizine but was unable to keep anything down due to dizziness.  States that currently it is eased off.  States that she did feel she was having some ringing in her ears but no pain.  No fevers.  Denies any headache.  No blurry vision.  No numbness or tingling.   Past Medical History:  Diagnosis Date  . AAA (abdominal aortic aneurysm) ([place])    The largest aortic measurement is 4.0 cm on 05/31/19  . Allergy 1980  . Anemia 09-2017  . Arthritis   . Bleeding from varicose vein   . Blood dyscrasia    BLEEDS EASILY   . Blood transfusion without reported diagnosis 2011 or 12  . Cataract ?  . CHF (congestive heart failure) ([place]) ?  . CKD (chronic kidney disease), stage III    Moderate  . Clotting disorder ([place]) ?  Marland Kitchen COPD (chronic obstructive pulmonary disease) ([place])   . Coronary artery disease   . Diabetes mellitus without complication ([place])    patient denies  . Diffuse cystic mastopathy   . Emphysema of lung ([place]) ?  Marland Kitchen Emphysema of lung ([place])   . Heartburn   . Hyperlipidemia   . Hypertension   . Hypothyroidism   . Incomplete left bundle branch block (LBBB) 06/22/2019   Noted on EKG  . L4-L5 disc bulge   . Multiple myeloma ([place])   . Myocardial infarction ([place]) 2019  . Osteoporosis, post-menopausal   . PAD (peripheral artery disease) ([place])   . Renal insufficiency   . Shortness of  breath dyspnea    WITH EXERTION   . Stroke ([place])    TIA     2016     WAS FOUND ON SCAN ORDERED FROM NEUROL  . Thyroid disease   . TIA (transient ischemic attack) 2016   left hand weakness  . Ulnar nerve compression, left   . Vertigo 06/22/2019   doing much today 06/28/2019   Family History  Problem Relation Age of Onset  . Heart disease Mother        MI  . Arthritis Mother   . Stroke Mother   . Hyperlipidemia Mother   . Hypertension Mother   . Varicose Veins Mother   . Heart disease Father        MI  . Hyperlipidemia Father   . Arthritis Father   . Cancer Father   . COPD Father   . Hearing loss Father   . Hypertension Father   . Vision loss Father   . Diabetes Sister   . COPD Sister   . Heart disease Sister   . Hyperlipidemia Sister   . Hypertension Sister   . Hyperlipidemia Brother   . COPD Brother   . Diabetes Brother   . Heart disease Brother   . Hypertension Brother   . Arthritis Sister   .  Hyperlipidemia Sister   . Hypertension Sister   . Vision loss Sister   . Varicose Veins Sister   . Arthritis Brother   . Varicose Veins Brother   . Arthritis Daughter   . Arthritis Daughter   . Diabetes Daughter   . Cancer Paternal Aunt   . COPD Sister   . Heart disease Sister   . Hyperlipidemia Sister   . Hypertension Sister   . Varicose Veins Sister   . Heart disease Maternal Uncle   . Heart disease Paternal Uncle    Past Surgical History:  Procedure Laterality Date  . ABDOMINAL HYSTERECTOMY  1990  . BACK SURGERY  2011  . BREAST EXCISIONAL BIOPSY Left    2010?   Marland Kitchen BREAST SURGERY  1975   A lump removed left breast  . CATARACT EXTRACTION, BILATERAL    . COLONOSCOPY  1998  . EYE SURGERY Bilateral 05/06/14  . JOINT REPLACEMENT     LT KNEE  . KNEE SURGERY Left 2012  . LEFT HEART CATH AND CORONARY ANGIOGRAPHY Left 11/19/2018   Procedure: LEFT HEART CATH AND CORONARY ANGIOGRAPHY;  Surgeon: Wellington Hampshire, MD;  Location: [facility];  Service:  Cardiovascular;  Laterality: Left;  . OOPHORECTOMY  1990  . parathyroid transplant     2/3 THYROID REMOVED   (GOITER)  . SPINE SURGERY    . THYROID SURGERY    . TONSILLECTOMY    . TOTAL HIP ARTHROPLASTY Right 07/02/2019   Procedure: TOTAL HIP ARTHROPLASTY ANTERIOR APPROACH;  Surgeon: Paralee Cancel, MD;  Location: [facility];  Service: Orthopedics;  Laterality: Right;  70 mins  . TOTAL SHOULDER ARTHROPLASTY Left 01/14/2016   Procedure: LEFT TOTAL SHOULDER ARTHROPLASTY;  Surgeon: Justice Britain, MD;  Location: [facility];  Service: Orthopedics;  Laterality: Left;  . TOTAL SHOULDER ARTHROPLASTY Right 11/03/2016   Procedure: RIGHT TOTAL SHOULDER ARTHROPLASTY;  Surgeon: Justice Britain, MD;  Location: [facility];  Service: Orthopedics;  Laterality: Right;  . ULNAR NERVE REPAIR     LEFT  . vein closure procedure Left 2010   Patient Active Problem List   Diagnosis Date Noted  . Overweight (BMI 25.0-29.9) 07/03/2019  . Acute blood loss anemia 07/03/2019  . S/P right THA, AA 07/02/2019  . History of COVID-19 04/11/2019  . Skin callus 04/08/2019  . Anemia in chronic kidney disease 01/18/2019  . Chronic kidney disease, stage III (moderate) 01/18/2019  . Multiple myeloma ([place]) 01/18/2019  . History of CVA (cerebrovascular accident) 11/28/2018  . CAD (coronary artery disease) 11/28/2018  . Abnormal stress test   . Pain due to onychomycosis of toenails of both feet 10/18/2018  . Left lower lobe pulmonary nodule 08/18/2017  . Abdominal aortic aneurysm (AAA) without rupture ([place]) 08/18/2017  . Goals of care, counseling/discussion 08/18/2017  . Benign hypertensive heart and CKD, stage 3 (GFR 30-59), w CHF ([place]) 05/29/2017  . Drug-induced constipation 11/07/2016  . Primary osteoarthritis involving multiple joints 11/07/2016  . Simple chronic bronchitis ([place]) 11/07/2016  . Advanced care planning/counseling discussion 11/02/2016  . S/P shoulder replacement 01/14/2016  . Hypothyroid 11/04/2014  . Hyperlipidemia   .  Ulcerated varicose veins of leg, right ([place]) 09/30/2014  . Peripheral arterial occlusive disease ([place]) 09/30/2014      Prior to Admission medications   Medication Sig Start Date End Date Taking? Authorizing Provider  aspirin (ASPIRIN CHILDRENS) 81 MG chewable tablet Chew 1 tablet (81 mg total) by mouth 2 (two) times daily. Start the day after finishing the Xarelto. Take  for 4 weeks, then resume regular dose. 07/04/19 08/03/19  Danae Orleans, PA-C  atorvastatin (LIPITOR) 40 MG tablet Take 1 tablet (40 mg total) by mouth daily. 12/05/18 06/24/20  Rise Mu, PA-C  benazepril (LOTENSIN) 20 MG tablet Take 1 tablet (20 mg total) by mouth daily. 06/28/18   Guadalupe Maple, MD  docusate sodium (COLACE) 100 MG capsule Take 1 capsule (100 mg total) by mouth 2 (two) times daily. 07/03/19   Danae Orleans, PA-C  ferrous sulfate (FERROUSUL) 325 (65 FE) MG tablet Take 1 tablet (325 mg total) by mouth 3 (three) times daily with meals for 14 days. 07/03/19 07/17/19  Danae Orleans, PA-C  HYDROcodone-acetaminophen (NORCO) 5-325 MG tablet Take 1-2 tablets by mouth every 4 (four) hours as needed for moderate pain or severe pain. 07/03/19   Danae Orleans, PA-C  levothyroxine (SYNTHROID, LEVOTHROID) 100 MCG tablet Take 1 tablet (100 mcg total) by mouth daily. 06/28/18   Guadalupe Maple, MD  meclizine (ANTIVERT) 25 MG tablet Take 1 tablet (25 mg total) by mouth 2 (two) times daily as needed for dizziness. 07/17/19   Merlyn Lot, MD  methocarbamol (ROBAXIN) 500 MG tablet Take 1 tablet (500 mg total) by mouth every 6 (six) hours as needed for muscle spasms. 07/03/19   Danae Orleans, PA-C  metoprolol tartrate (LOPRESSOR) 50 MG tablet Take 1 tablet (50 mg total) by mouth 2 (two) times daily. 12/26/18 12/26/19  Rise Mu, PA-C  Multiple Vitamin (MULTIVITAMIN WITH MINERALS) TABS tablet Take 1 tablet by mouth daily.    [provider]  ondansetron (ZOFRAN) 4 MG tablet Take 1 tablet (4 mg total) by mouth daily as  needed. 07/17/19 07/16/20  Merlyn Lot, MD  polyethylene glycol (MIRALAX / GLYCOLAX) 17 g packet Take 17 g by mouth 2 (two) times daily. 07/03/19   Danae Orleans, PA-C  rivaroxaban (XARELTO) 10 MG TABS tablet Take 1 tablet (10 mg total) by mouth daily. 07/03/19   Danae Orleans, PA-C  triamcinolone cream (KENALOG) 0.1 % Apply 1 application topically 2 (two) times daily. Patient taking differently: Apply 1 application topically 2 (two) times daily as needed (skin irritation/rash.).  12/28/17   Guadalupe Maple, MD    Allergies Oxycodone, Codeine, and Simvastatin    Social History Social History   Tobacco Use  . Smoking status: Former Smoker    Packs/day: 3.00    Years: 25.00    Pack years: 75.00    Types: Cigarettes    Quit date: 11/03/1989    Years since quitting: 29.7  . Smokeless tobacco: Never Used  Substance Use Topics  . Alcohol use: Not Currently    Alcohol/week: 0.0 standard drinks  . Drug use: No    Review of Systems Patient denies headaches, rhinorrhea, blurry vision, numbness, shortness of breath, chest pain, edema, cough, abdominal pain, nausea, vomiting, diarrhea, dysuria, fevers, rashes or hallucinations unless otherwise stated above in HPI. ____________________________________________   PHYSICAL EXAM:  VITAL SIGNS: Vitals:   07/17/19 2104 07/17/19 2148  BP: (!) 157/59 (!) 157/69  Pulse: 84 84  Resp: 18 20  Temp:    SpO2: 100% 97%    Constitutional: Alert and oriented.  Eyes: Conjunctivae are normal. Left lateral inducible nystagmus Head: Atraumatic. Nose: No congestion/rhinnorhea. Mouth/Throat: Mucous membranes are moist.  EAC clear bilat Neck: No stridor. Painless ROM.  Cardiovascular: Normal rate, regular rhythm. Grossly normal heart sounds.  Good peripheral circulation. Respiratory: Normal respiratory effort.  No retractions. Lungs CTAB. Gastrointestinal: Soft and nontender. No distention.  No abdominal bruits. No CVA  tenderness. Genitourinary:  Musculoskeletal: No lower extremity tenderness nor edema.  No joint effusions. Neurologic:  CN- intact.  No facial droop, Normal FNF.  Normal heel to shin.  Sensation intact bilaterally. Normal speech and language. No gross focal neurologic deficits are appreciated. No gait instability. Skin:  Skin is warm, dry and intact. No rash noted. Psychiatric: Mood and affect are normal. Speech and behavior are normal.  ____________________________________________   LABS (all labs ordered are listed, but only abnormal results are displayed)  Results for orders placed or performed during the hospital encounter of 07/17/19 (from the past 24 hour(s))  Basic metabolic panel     Status: Abnormal   Collection Time: 07/17/19  4:36 PM  Result Value Ref Range   Sodium 137 135 - 145 mmol/L   Potassium 4.7 3.5 - 5.1 mmol/L   Chloride 103 98 - 111 mmol/L   CO2 24 22 - 32 mmol/L   Glucose, Bld 133 (H) 70 - 99 mg/dL   BUN 27 (H) 8 - 23 mg/dL   Creatinine, Ser 1.34 (H) 0.44 - 1.00 mg/dL   Calcium 10.0 8.9 - 10.3 mg/dL   GFR calc non Af Amer 38 (L) >60 mL/min   GFR calc Af Amer 44 (L) >60 mL/min   Anion gap 10 5 - 15  CBC     Status: Abnormal   Collection Time: 07/17/19  4:36 PM  Result Value Ref Range   WBC 5.1 4.0 - 10.5 K/uL   RBC 3.05 (L) 3.87 - 5.11 MIL/uL   Hemoglobin 9.9 (L) 12.0 - 15.0 g/dL   HCT 31.1 (L) 36.0 - 46.0 %   MCV 102.0 (H) 80.0 - 100.0 fL   MCH 32.5 26.0 - 34.0 pg   MCHC 31.8 30.0 - 36.0 g/dL   RDW 15.5 11.5 - 15.5 %   Platelets 359 150 - 400 K/uL   nRBC 0.0 0.0 - 0.2 %  Lipase, blood     Status: None   Collection Time: 07/17/19  4:36 PM  Result Value Ref Range   Lipase 39 11 - 51 U/L  Hepatic function panel     Status: Abnormal   Collection Time: 07/17/19  4:36 PM  Result Value Ref Range   Total Protein 8.9 (H) 6.5 - 8.1 g/dL   Albumin 3.8 3.5 - 5.0 g/dL   AST 19 15 - 41 U/L   ALT 12 0 - 44 U/L   Alkaline Phosphatase 84 38 - 126 U/L   Total  Bilirubin 0.5 0.3 - 1.2 mg/dL   Bilirubin, Direct <0.1 0.0 - 0.2 mg/dL   Indirect Bilirubin NOT CALCULATED 0.3 - 0.9 mg/dL   ____________________________________________  EKG My review and personal interpretation at Time: 16:34   Indication: dizziness  Rate: 95  Rhythm: sinus Axis: left Other: nomrla intervals, no stemi ____________________________________________  RADIOLOGY  ____________________________________________   PROCEDURES  Procedure(s) performed:  Procedures    Critical Care performed: no ____________________________________________   INITIAL IMPRESSION / ASSESSMENT AND PLAN / ED COURSE  Pertinent labs & imaging results that were available during my care of the patient were reviewed by me and considered in my medical decision making (see chart for details).   DDX: Vertigo, central vertigo, dehydration, electrolyte abnormality, dysrhythmia  Bailey Ayala is a 79 y.o. who presents to the ED with symptoms that are consistent with peripheral vertigo.  She has a benign hints exam.  Given recent work-up for similar symptoms I do not  feel that repeat neurologic imaging is clinically indicated with this presentation.  She denies any chest pain or pressure.  Do not feel that this is cardiac etiology will give IV fluids antiemetic and meclizine and reassess.  Clinical Course as of Jul 17 2303  Wed Jul 17, 2019  2241 Patient feels improved after IV fluids as well as antiemetic and meclizine.  Not have any persistent vertiginous symptoms.  She is requesting discharge home.  Will ambulate to make sure she is steady on her feet.  She does have follow-up with ear nose and throat.  I will prescribe antiemetic and meclizine.  I do not feel that further neuroimaging clinically indicated given her current presentation.  Have discussed with the patient and available family all diagnostics and treatments performed thus far and all questions were answered to the best of my ability. The  patient demonstrates understanding and agreement with plan.    [PR]    Clinical Course User Index [PR] Merlyn Lot, MD    The patient was evaluated in Emergency Department today for the symptoms described in the history of present illness. He/she was evaluated in the context of the global COVID-19 pandemic, which necessitated consideration that the patient might be at risk for infection with the SARS-CoV-2 virus that causes COVID-19. Institutional protocols and algorithms that pertain to the evaluation of patients at risk for COVID-19 are in a state of rapid change based on information released by regulatory bodies including the CDC and federal and state organizations. These policies and algorithms were followed during the patient's care in the ED.  As part of my medical decision making, I reviewed the following data within the [facility] notes reviewed and incorporated, Labs reviewed, notes from prior ED visits and [place] Controlled Substance Database   ____________________________________________   FINAL CLINICAL IMPRESSION(S) / ED DIAGNOSES  Final diagnoses:  Vertigo      NEW MEDICATIONS STARTED DURING THIS VISIT:  New Prescriptions   ONDANSETRON (ZOFRAN) 4 MG TABLET    Take 1 tablet (4 mg total) by mouth daily as needed.     Note:  This document was prepared using Dragon voice recognition software and may include unintentional dictation errors.    Merlyn Lot, MD 07/17/19 2306

## 2019-07-17 NOTE — ED Notes (Signed)
Pt provided applesauce for PO challenge. Pt denies any other food than apple sauce due to saying she is, "not hungry." Pt educated on reason for PO challenge. States that she will eat apple sauce

## 2019-07-17 NOTE — ED Notes (Signed)
Lab contacted for add on lab of hepatic function panel. States they should be able to add this on but will call with no success.

## 2019-07-17 NOTE — ED Notes (Signed)
This tech and [place], EDT, assisted pt with ambulation. Pt tolerated well.

## 2019-07-17 NOTE — ED Notes (Signed)
Pt states that she is experiencing dizziness and nausea that is worse with ambulation and position change.

## 2019-07-17 NOTE — ED Triage Notes (Signed)
Pt presents to ED via [facility] with c/o N/V and dizziness that started yesterday. Pt states seen approx 1 month for same, dx with vertigo.

## 2019-07-17 NOTE — ED Notes (Signed)
Pt ate all of cup of applesauce with no vomiting or nausea.

## 2019-07-17 NOTE — ED Triage Notes (Signed)
Pt comes into the ED via EMS from home with N/V with a hx of the same. 178/80, 95% RA, [id number]

## 2019-07-18 NOTE — Progress Notes (Signed)
[facility]  [address], [address] [place], [place] [address] Phone: [phone]  Fax: [phone]   Clinic Day:  07/22/2019  Referring physician: Guadalupe Maple, MD  Chief Complaint: Bailey Ayala is a 79 y.o. female with multiple myeloma who is seen for assessment after interval right hip replacement and discussion regarding initiation of treatment for myeloma.  HPI: The patient was last seen in the medical oncology clinic on 06/26/2019. At that time, she had right hip discomfort affecting her quality of life.  She had recently been diagnosed with vertigo.  She denies any new concerns.  We discussed plans for initiation of myeloma treatment s/p recovery from right hip replacement.  Patient was seen in the chemotherapy care clinic by Beckey Rutter, NP on 06/27/2019. She noted ongoing vertigo and general weakness. She reported an upcoming hip surgery.   She was admitted from 07/02/2019 - 07/05/2019.  She underwent total right hip replacement with Dr. Paralee Cancel on 07/02/2019. There was 700 cc of blood loss. Patient tolerated procedure well. Due to symptomatic anemia (hemoglobin 7.0), she received 2 units of PRBCs on 07/03/2019.  Discharge hemoglobin was 9.0 on 07/05/2019.  She was seen in the [facility] ER on 07/17/2019 for nausea, emesis, and dizziness felt secondary to peripheral vertigo.  She reported taking meclizine but was unable to keep anything down due to dizziness. She had some ringing in her ears but no pain. No fevers. Denied any headache. No blurry, numbness or tingling. Patients symptoms were consistent with peripheral vertigo. Patient was given IV fluids, antiemetic and meclizine. Patient was prescribed Zofran 4 mg daily prn.   During the interim, the patient has felt "ok". She feels 40% herself again. She spends 2/3 of the day sitting and resting. She notes her right hip is better s/p replacement but she has right hip muscle pain. She has been home  alone for 3 days and getting around good. She is waiting to hear back from physical therapy.   She has lost 8 pounds since last visit on 06/26/2019. She is eating well. Patient has vertigo that comes and goes with an upset stomach and nausea this morning. She notes chronic diarrhea. Patient is due to see an audiologist on 07/23/2019.   She has a follow up with Dr. Alvan Dame on 08/21/2019. Patient is on Xarelto 10 mg and will go back to aspirin 81 mg tomorrow. She denies any bleeding. She does not want to be on too many medications. Patient would like to be seen by Dr. Lucky Cowboy for evaluation. She was unable to see him because of her surgery. She denies any recent issues with varicose veins.   Her daughters deny filling out the Revlimid medication information sheet. Her daughters had concerns about treatment.  Multiple questions were answered.   Patient BP was 109/54 in the clinic today.    Past Medical History:  Diagnosis Date  . AAA (abdominal aortic aneurysm) ([place])    The largest aortic measurement is 4.0 cm on 05/31/19  . Allergy 1980  . Anemia 09-2017  . Arthritis   . Bleeding from varicose vein   . Blood dyscrasia    BLEEDS EASILY   . Blood transfusion without reported diagnosis 2011 or 12  . Cataract ?  . CHF (congestive heart failure) ([place]) ?  . CKD (chronic kidney disease), stage III    Moderate  . Clotting disorder ([place]) ?  Marland Kitchen COPD (chronic obstructive pulmonary disease) ([place])   . Coronary artery disease   .  Diabetes mellitus without complication ([place])    patient denies  . Diffuse cystic mastopathy   . Emphysema of lung ([place]) ?  Marland Kitchen Emphysema of lung ([place])   . Heartburn   . Hyperlipidemia   . Hypertension   . Hypothyroidism   . Incomplete left bundle branch block (LBBB) 06/22/2019   Noted on EKG  . L4-L5 disc bulge   . Multiple myeloma ([employer])   . Myocardial infarction ([place]) 2019  . Osteoporosis, post-menopausal   . PAD (peripheral artery disease) ([place])   . Renal insufficiency     . Shortness of breath dyspnea    WITH EXERTION   . Stroke ([place])    TIA     2016     WAS FOUND ON SCAN ORDERED FROM NEUROL  . Thyroid disease   . TIA (transient ischemic attack) 2016   left hand weakness  . Ulnar nerve compression, left   . Vertigo 06/22/2019   doing much today 06/28/2019    Past Surgical History:  Procedure Laterality Date  . ABDOMINAL HYSTERECTOMY  1990  . BACK SURGERY  2011  . BREAST EXCISIONAL BIOPSY Left    2010?   Marland Kitchen BREAST SURGERY  1975   A lump removed left breast  . CATARACT EXTRACTION, BILATERAL    . COLONOSCOPY  1998  . EYE SURGERY Bilateral 05/06/14  . JOINT REPLACEMENT     LT KNEE  . KNEE SURGERY Left 2012  . LEFT HEART CATH AND CORONARY ANGIOGRAPHY Left 11/19/2018   Procedure: LEFT HEART CATH AND CORONARY ANGIOGRAPHY;  Surgeon: Wellington Hampshire, MD;  Location: [facility];  Service: Cardiovascular;  Laterality: Left;  . OOPHORECTOMY  1990  . parathyroid transplant     2/3 THYROID REMOVED   ([place])  . SPINE SURGERY    . THYROID SURGERY    . TONSILLECTOMY    . TOTAL HIP ARTHROPLASTY Right 07/02/2019   Procedure: TOTAL HIP ARTHROPLASTY ANTERIOR APPROACH;  Surgeon: Paralee Cancel, MD;  Location: [facility];  Service: Orthopedics;  Laterality: Right;  70 mins  . TOTAL SHOULDER ARTHROPLASTY Left 01/14/2016   Procedure: LEFT TOTAL SHOULDER ARTHROPLASTY;  Surgeon: Justice Britain, MD;  Location: [facility];  Service: Orthopedics;  Laterality: Left;  . TOTAL SHOULDER ARTHROPLASTY Right 11/03/2016   Procedure: RIGHT TOTAL SHOULDER ARTHROPLASTY;  Surgeon: Justice Britain, MD;  Location: [facility];  Service: Orthopedics;  Laterality: Right;  . ULNAR NERVE REPAIR     LEFT  . vein closure procedure Left 2010    Family History  Problem Relation Age of Onset  . Heart disease Mother        MI  . Arthritis Mother   . Stroke Mother   . Hyperlipidemia Mother   . Hypertension Mother   . Varicose Veins Mother   . Heart disease Father        MI  . Hyperlipidemia Father    . Arthritis Father   . Cancer Father   . COPD Father   . Hearing loss Father   . Hypertension Father   . Vision loss Father   . Diabetes Sister   . COPD Sister   . Heart disease Sister   . Hyperlipidemia Sister   . Hypertension Sister   . Hyperlipidemia Brother   . COPD Brother   . Diabetes Brother   . Heart disease Brother   . Hypertension Brother   . Arthritis Sister   . Hyperlipidemia Sister   . Hypertension Sister   . Vision  loss Sister   . Varicose Veins Sister   . Arthritis Brother   . Varicose Veins Brother   . Arthritis Daughter   . Arthritis Daughter   . Diabetes Daughter   . Cancer Paternal Aunt   . COPD Sister   . Heart disease Sister   . Hyperlipidemia Sister   . Hypertension Sister   . Varicose Veins Sister   . Heart disease Maternal Uncle   . Heart disease Paternal Uncle     Social History:  reports that she quit smoking about 29 years ago. Her smoking use included cigarettes. She has a 75.00 pack-year smoking history. She has never used smokeless tobacco. She reports previous alcohol use. She reports that she does not use drugs. Patient is a former 3 pack per day smoker for 25 years (75 pack year). Patient lives in [place]. She is a former Electrical engineer. Patient denies known exposures to radiation on toxins. The patient is accompanied by Zacarias Pontes and Becky via ipad today.  Allergies:  Allergies  Allergen Reactions  . Oxycodone Other (See Comments)    Made her feel "shaky"  . Codeine Other (See Comments)    hallucinations    . Simvastatin Diarrhea and Nausea Only    Current Medications: Current Outpatient Medications  Medication Sig Dispense Refill  . atorvastatin (LIPITOR) 40 MG tablet Take 1 tablet (40 mg total) by mouth daily. 90 tablet 3  . benazepril (LOTENSIN) 20 MG tablet Take 1 tablet (20 mg total) by mouth daily. 90 tablet 4  . ferrous sulfate (FERROUSUL) 325 (65 FE) MG tablet Take 1 tablet (325 mg total) by mouth 3 (three) times daily with  meals for 14 days. 42 tablet 0  . levothyroxine (SYNTHROID, LEVOTHROID) 100 MCG tablet Take 1 tablet (100 mcg total) by mouth daily. 90 tablet 4  . meclizine (ANTIVERT) 25 MG tablet Take 1 tablet (25 mg total) by mouth 2 (two) times daily as needed for dizziness. 15 tablet 0  . methocarbamol (ROBAXIN) 500 MG tablet Take 1 tablet (500 mg total) by mouth every 6 (six) hours as needed for muscle spasms. 40 tablet 0  . metoprolol tartrate (LOPRESSOR) 50 MG tablet Take 1 tablet (50 mg total) by mouth 2 (two) times daily. 60 tablet 11  . Multiple Vitamin (MULTIVITAMIN WITH MINERALS) TABS tablet Take 1 tablet by mouth daily.    . ondansetron (ZOFRAN) 4 MG tablet Take 1 tablet (4 mg total) by mouth daily as needed. 14 tablet 0  . polyethylene glycol (MIRALAX / GLYCOLAX) 17 g packet Take 17 g by mouth 2 (two) times daily. 28 packet 0  . rivaroxaban (XARELTO) 10 MG TABS tablet Take 1 tablet (10 mg total) by mouth daily. 14 tablet 0  . triamcinolone cream (KENALOG) 0.1 % Apply 1 application topically 2 (two) times daily. (Patient taking differently: Apply 1 application topically 2 (two) times daily as needed (skin irritation/rash.). ) 30 g 0  . aspirin (ASPIRIN CHILDRENS) 81 MG chewable tablet Chew 1 tablet (81 mg total) by mouth 2 (two) times daily. Start the day after finishing the Xarelto. Take for 4 weeks, then resume regular dose. (Patient not taking: Reported on 07/22/2019) 60 tablet 0   No current facility-administered medications for this visit.    Review of Systems  Constitutional: Positive for weight loss (8 lbs). Negative for chills, diaphoresis, fever and malaise/fatigue.       Doing ok. Feels 40% like herself. Performs ADLs. Spends 2/3 of the day sitting and resting.  HENT: Negative for congestion, ear discharge, ear pain, hearing loss, nosebleeds, sinus pain, sore throat and tinnitus.   Eyes: Negative for blurred vision.  Respiratory: Negative for cough (chronic; stable), hemoptysis, sputum  production and shortness of breath (exertional; stable).        COPD.  Cardiovascular: Negative for chest pain, palpitations and leg swelling (varicose vien swelling).  Gastrointestinal: Positive for diarrhea (chronic) and nausea (this morning). Negative for abdominal pain, blood in stool, constipation, heartburn, melena and vomiting.       Upset stomach this morning. Eating well.  Genitourinary: Negative for dysuria, frequency, hematuria and urgency.  Musculoskeletal: Positive for myalgias (right hip muscles). Negative for back pain (chronic), joint pain (right hip; better s/p right hip replacement) and neck pain.  Skin: Negative for itching and rash.  Neurological: Negative for dizziness, tingling, sensory change, focal weakness, weakness and headaches.       Aneurysm, Near syncope episode on 06/22/2019. Vertigo; comes and goes.  Endo/Heme/Allergies: Environmental allergies: varicose veins. Bruises/bleeds easily.       HYPOthyroidism - on levothyroxine.  Psychiatric/Behavioral: Negative for depression and memory loss. The patient is not nervous/anxious and does not have insomnia.   All other systems reviewed and are negative.   Performance status (ECOG): 2  Vitals Blood pressure (!) 109/54, pulse 64, temperature 97.7 F (36.5 C), temperature source Tympanic, resp. rate 18, height '5\' 5"'$  (1.651 m), weight 165 lb 0.2 oz (74.9 kg), SpO2 95 %.   Physical Exam Vitals and nursing note reviewed.  Constitutional:      General: She is not in acute distress.    Appearance: She is well-developed and well-nourished. She is not diaphoretic.     Comments: She has a cane by her side. Patient needs minimal assistance on and off exam room table.  HENT:     Head: Normocephalic and atraumatic.     Mouth/Throat:     Mouth: Oropharynx is clear and moist.     Pharynx: No oropharyngeal exudate.      Comments: Brown hair pulled back. Mask. Eyes:     General: No scleral icterus.    Extraocular Movements:  EOM normal.     Conjunctiva/sclera: Conjunctivae normal.     Pupils: Pupils are equal, round, and reactive to light.     Comments: Blue eyes.  Cardiovascular:     Rate and Rhythm: Normal rate and regular rhythm.     Heart sounds: Normal heart sounds.  Pulmonary:     Effort: Pulmonary effort is normal. No respiratory distress.     Breath sounds: Normal breath sounds. No wheezing or rales.  Chest:     Chest wall: No tenderness.  Abdominal:     General: Bowel sounds are normal. There is no distension.     Palpations: Abdomen is soft. There is no mass.     Tenderness: There is no abdominal tenderness. There is no guarding or rebound.  Musculoskeletal:        General: No tenderness or edema. Normal range of motion.     Cervical back: Normal range of motion and neck supple.  Lymphadenopathy:     Head:     Right side of head: No preauricular, posterior auricular or occipital adenopathy.     Left side of head: No preauricular or posterior auricular adenopathy.     Cervical: No cervical adenopathy.     Upper Body:  No axillary adenopathy present.    Right upper body: No supraclavicular adenopathy.     Left  upper body: No supraclavicular adenopathy.     Lower Body: No right inguinal adenopathy. No left inguinal adenopathy.  Skin:    General: Skin is warm and dry.     Comments: RIGHT hip incision s/p replacement healing well.  Neurological:     Mental Status: She is alert and oriented to person, place, and time.  Psychiatric:        Mood and Affect: Mood and affect normal.        Behavior: Behavior normal.        Thought Content: Thought content normal.        Judgment: Judgment normal.    No visits with results within 3 Day(s) from this visit.  Latest known visit with results is:  Admission on 07/17/2019, Discharged on 07/17/2019  Component Date Value Ref Range Status  . Sodium 07/17/2019 137  135 - 145 mmol/L Final  . Potassium 07/17/2019 4.7  3.5 - 5.1 mmol/L Final  . Chloride  07/17/2019 103  98 - 111 mmol/L Final  . CO2 07/17/2019 24  22 - 32 mmol/L Final  . Glucose, Bld 07/17/2019 133* 70 - 99 mg/dL Final   Glucose reference range applies only to samples taken after fasting for at least 8 hours.  . BUN 07/17/2019 27* 8 - 23 mg/dL Final  . Creatinine, Ser 07/17/2019 1.34* 0.44 - 1.00 mg/dL Final  . Calcium 07/17/2019 10.0  8.9 - 10.3 mg/dL Final  . GFR calc non Af Amer 07/17/2019 38* >60 mL/min Final  . GFR calc Af Amer 07/17/2019 44* >60 mL/min Final  . Anion gap 07/17/2019 10  5 - 15 Final   Performed at [facility], [address] [place], [place] [address]  . WBC 07/17/2019 5.1  4.0 - 10.5 K/uL Final  . RBC 07/17/2019 3.05* 3.87 - 5.11 MIL/uL Final  . Hemoglobin 07/17/2019 9.9* 12.0 - 15.0 g/dL Final  . HCT 07/17/2019 31.1* 36.0 - 46.0 % Final  . MCV 07/17/2019 102.0* 80.0 - 100.0 fL Final  . MCH 07/17/2019 32.5  26.0 - 34.0 pg Final  . MCHC 07/17/2019 31.8  30.0 - 36.0 g/dL Final  . RDW 07/17/2019 15.5  11.5 - 15.5 % Final  . Platelets 07/17/2019 359  150 - 400 K/uL Final  . nRBC 07/17/2019 0.0  0.0 - 0.2 % Final   Performed at [facility], [address] [place], [place] [address]  . Lipase 07/17/2019 39  11 - 51 U/L Final   Performed at [facility], [place] [place], [place] [address]  . Total Protein 07/17/2019 8.9* 6.5 - 8.1 g/dL Final  . Albumin 07/17/2019 3.8  3.5 - 5.0 g/dL Final  . AST 07/17/2019 19  15 - 41 U/L Final  . ALT 07/17/2019 12  0 - 44 U/L Final  . Alkaline Phosphatase 07/17/2019 84  38 - 126 U/L Final  . Total Bilirubin 07/17/2019 0.5  0.3 - 1.2 mg/dL Final  . Bilirubin, Direct 07/17/2019 <0.1  0.0 - 0.2 mg/dL Final  . Indirect Bilirubin 07/17/2019 NOT CALCULATED  0.3 - 0.9 mg/dL Final   Performed at [facility], [address] [place], [place] [address]    Assessment:  ARBELL WYCOFF is a 79 y.o. female with smoldering multiple myeloma. SPEPon 06/21/2017 revealed a 1.2  gm/dL monoclonal spike. Random urine revealed 4.6 mg/dL protein and a 40.6% M-spike. Calcium, albumin, and serum protein were normal.  Work-up on 03/08/2019revealed a hematocrit of 31.1, hemoglobin 10.4,  MCV 97.4, platelets 227,000, WBC 5700 with an ANC of 3300. SPEPrevealed a 1.1 gm/dL IgG monoclonal protein with lambda light chain specificity and monoclonal free lambda light chains(Bence-Jones protein). IgG was 1660 [phone]) and IgA was 48 (64-422). Kappa free light chains were 23.7, lambda free light chains were 142.2 and ratio 0.17 (0.26-1.65). Beta2-microglobulin was 4.7 (0.6-2.4). Normal studies included: B12, folate, and ferritin (75). TSHwas 0.204 (low). 24 hour UPEPrevealed kappa free light chains 28.80 mg/L, lambda free light chains 63.80 mg/L and ration 0.45 (2.04 - 10.37). M spike was 27.4% (16 mg/24 hours).  Bone surveyon 06/30/2017 revealed no suspicious focal bony lesions.  Bone marrow aspirate and biopsyon 07/26/2017 revealed a slightly hypercellular marrow for age with trilineage hematopoiesis. There was 13% plasmacytosis(10-15% byCD138IHC). Plasma cells were ininterstitial cells and small clusters. Lambda light chains appeared restricted. Features were felt compatible with plasma cell neoplasm.  Bone marrow aspirate and biopsy on 05/29/2019 revealed a normocellularmarrow for age (20-30%) withincreased monoclonal plasma cells (15% aspirate, 20-30% CD138by IHC).Plasma cells were lambda restricted.There were no myelodysplastic changes.Storage iron was present. Flow cytometry revealed no monoclonal B-cell or phenotypically aberrant T-cell population. Findings were consistent with persistent plasma cell myeloma.Peripheral bloodrevealed amacrocytic anemia with rouleaux formation.Cytogenetics were normal (65, XX). FISH for MDSrevealed gain of KMT2A (MLL) or chromosome 11/11q (22F.5.0%, normal < 3.1%).FISH for myeloma revealed a deletion of NF1  or chromosome 17q11 (2R1G, 89%, not seen in validation studies).  PET scanon 08/14/2017 revealed no hypermetabolic lesions of myeloma. There was a 3.6 cm infrarenal aortic aneurysm. Ultrasound in 2 years was recommended. She had a 4 mm LLL noduletoo small to characterize.  SPEPhas been followed: 1.1 on 06/30/2017, 1.2 on 11/14/2017, 1.3 on 01/26/2018, 1.2 on 05/10/2018, 1.4 on 08/08/2018, 1.2 on 11/09/2018, 1.4 on 02/11/2019, and 1.6 on 05/09/2019. IgGhas been followed: 1660 on 06/30/2017, 1684 on 11/14/2017, 1795 on 01/26/2018, and 2054 on 08/08/2018.  Lambda free light chainshave been followed:142.2 (ratio 0.17)on03/11/2017, 214.4(ratio 0.09)on01/16/2020, 209.3(ratio 0.08)on04/14/2020, 220.2(ratio 0.07)on07/17/2020, 228.9(ratio 0.08)on 02/11/2019,and 258.4 (ratio 0.07)on01/14/2021.  Chest CTon 01/24/2018 revealed persistent LLL nodule. Area calcified and felt to be consistent with a granuloma. There were no other suspicious pulmonary nodules. There was new Schmorl's node formation involving the inferior endplate of W43. There were stable small scattered osseous lesions related to underlying multiple myeloma.  PET scanon 05/15/2018 revealed nohypermetabolic lesions characteristic of active myeloma. There was stable hypermetabolic apical wall of the left ventricle, there waspotentially a small pseudoaneurysmin this vicinity, but no appreciable mass was noted on 01/24/2018. There was aninfrarenal abdominal aortic aneurysm, 3.6 cm in diameter. Follow-up ultrasound in 2 years was recommended. Other imaging findings of potential clinical significance: aortic atherosclerosis and coronary atherosclerosis.  Bone survery on01/25/2021 showed numerous new lytic lesions throughout the skeleton including the posterior calvarium, bilateral humeri, bilateral iliac bones, left inferior pubic ramus, bilateral femurs.  She has stage III chronic kidney disease(Cr 2.25 on  05/27/2019).   She has a macrocytic anemia. B12, folate, and TSH were normal on 05/10/2018. Retic was 1.6%. Ferritin and iron studies were normal on 05/10/2018. Appetiteremains poor. Last colonoscopywas >10 years ago (she declines repeat).  She has a 75 pack year smoking history. She has COPD. Chest CT without contraston 07/25/2017 revealed no suspicious pulmonary nodules.  She underwent total right hip replacement on 07/02/2019. She required 2 units of PRBCs.  Symptomatically, she is recovering from right hip replacement.  She feels that she is at 40% capacity.  She has recently been performing her ADLs.  Plan:  1.   Review labs from 07/17/2019.  2.   Multiple myeloma Symptomatically, she notes fatigue. M spikewas 1.6 gm/dL on 05/09/2019.  Lambda free light chainswere  258.4 (ratio 0.07)on01/14/2021. Bone surveyon 05/20/2019 revealednumerous new lytic lesions throughout the skeleton. Bone marrow aspirateon 05/29/2019 revealed anormocellularmarrow for age (20-30%) withincreased monoclonal plasma cells (20-30% CD138IHC). Cytogenetics were normal (4, XX).  FISH for myeloma revealed a deletion of NF1 or chromosome 17q11 (2R1G, 89%, not seen in validation studies). Marrow plasmacytosis increased from 10-15% to 20-30% since 07/2017.  Review CRAB criteria. Patient has renal dysfunction (Cr 1.39), anemia (Hgb 9.4), and lytic bone lesions.  Calcium is 9.1 (normal). Patient is not a transplant candidate given her age and comorbidities.             Patient in agreement  Discuss plan for VRd (Velcade 1.3 mg/m2 SQ day 1,4,8,11, Revlimid po day 1-14, and Decadron 20 mg po on day 1,2,4,5,8,9,11 and 12) every 21 days for 8 cycles.  Consider VRd with weekly Velcade (day 1,8,15) and Decadron based on tolerance. She will receive maintenance Rd until progression or toxicity. Potential side  effects reviewed. Patient has recovered from hip surgery. She is currently not interested in treatment. Discuss plans for reassessment and reconsideration in 2-3 weeks. 3. Back and right hip pain PET scan on 01/21/2020revealed no active myeloma. Bone survey revealed multiple lytic lesions. Patient has advanced right hip osteoarthritis. She underwent hip replacement Dr Alvan Dame on 07/02/2019.  Contact Dr Alvan Dame. 4. Renal insufficiency Creatinine 1.39on 05/09/2019, 2.25 on 05/27/2019, and 1.60 on 06/22/2019.  Review plan for Retacrit as needed for anemia due to renal disease. 5. Macrocytic anemia Hematocrit 28.7. Hemoglobin 9.1. MCV 108.3 on 05/29/2019.             Hematocrit 28.7.  Hemoglobin 9.2.  MCV 106.3 on 06/22/2019. Hematocrit 31.1.  Hemoglobin 9.9.  MCV 102.0 on 07/17/2019.  B12was 911 and folate 60.9. on 05/16/2019. Shehas no liver disease. Bone marrow on04/03/2019and 02/03/2021revealed no evidence of MDS.  Continue to monitor. 6.Vascular issues in feet Patient notes significant blood loss secondary to fragile blood vessels in feet. She has been seen by vascular surgery.                          She may require sclerotherapy or laser ablation based on duplex study.  Reschedule appontment with Dr Lucky Cowboy before Thursday at noon (if possible). 7.   RN- paperwork for Revlimid. 8.   RTC in 2 -3 weeks for MD assessment, labs (CBC with diff, CMP, SPEP, FLCA) and initiation of Revlimid, Decadron and Velcade.  I discussed the assessment and treatment plan with the patient.  The patient was provided an opportunity to ask questions and all were answered.  The patient agreed with the plan and demonstrated an understanding of the instructions.  The patient was advised to call back if the symptoms worsen or if the condition fails to improve as anticipated.  I provided 32  minutes of face-to-face time during this encounter and > 50% was spent counseling as documented under my assessment and plan.  An additional 6-7 minutes were spent reviewing her chart (Epic and Care Everywhere) including notes, labs, and imaging studies.    Lequita Asal, MD, PhD    07/22/2019, 2:18 PM  I, Selena Batten, am acting as scribe for [employer]. Mike Gip, MD, PhD.  I, Jeffre Enriques C. Mike Gip, MD, have reviewed the above documentation for accuracy and completeness, and  I agree with the above.

## 2019-07-21 NOTE — Progress Notes (Signed)
START ON PATHWAY REGIMEN - Multiple Myeloma and Other Plasma Cell Dyscrasias     A cycle is every 21 days:     Bortezomib      Lenalidomide      Dexamethasone   **Always confirm dose/schedule in your pharmacy ordering system**  Patient Characteristics: Multiple Myeloma, Newly Diagnosed, Transplant Ineligible or Refused, Standard Risk Disease Classification: Multiple Myeloma R-ISS Staging: III Therapeutic Status: Newly Diagnosed Is Patient Eligible for Transplant<= Transplant Ineligible or Refused Risk Status: Standard Risk Intent of Therapy: Non-Curative / Palliative Intent, Discussed with Patient

## 2019-07-22 ENCOUNTER — Other Ambulatory Visit: Payer: Self-pay

## 2019-07-22 ENCOUNTER — Encounter: Payer: Self-pay | Admitting: Hematology and Oncology

## 2019-07-22 ENCOUNTER — Encounter: Payer: Self-pay | Admitting: Nurse Practitioner

## 2019-07-22 ENCOUNTER — Ambulatory Visit (INDEPENDENT_AMBULATORY_CARE_PROVIDER_SITE_OTHER): Payer: Medicare Other | Admitting: Nurse Practitioner

## 2019-07-22 ENCOUNTER — Inpatient Hospital Stay (HOSPITAL_BASED_OUTPATIENT_CLINIC_OR_DEPARTMENT_OTHER): Payer: Medicare Other | Admitting: Hematology and Oncology

## 2019-07-22 VITALS — BP 109/54 | HR 64 | Temp 97.7°F | Resp 18 | Ht 65.0 in | Wt 165.0 lb

## 2019-07-22 DIAGNOSIS — L97919 Non-pressure chronic ulcer of unspecified part of right lower leg with unspecified severity: Secondary | ICD-10-CM

## 2019-07-22 DIAGNOSIS — C9000 Multiple myeloma not having achieved remission: Secondary | ICD-10-CM

## 2019-07-22 DIAGNOSIS — R5383 Other fatigue: Secondary | ICD-10-CM | POA: Diagnosis not present

## 2019-07-22 DIAGNOSIS — R42 Dizziness and giddiness: Secondary | ICD-10-CM

## 2019-07-22 DIAGNOSIS — I83019 Varicose veins of right lower extremity with ulcer of unspecified site: Secondary | ICD-10-CM | POA: Diagnosis not present

## 2019-07-22 DIAGNOSIS — Z7189 Other specified counseling: Secondary | ICD-10-CM | POA: Diagnosis not present

## 2019-07-22 DIAGNOSIS — I779 Disorder of arteries and arterioles, unspecified: Secondary | ICD-10-CM | POA: Diagnosis not present

## 2019-07-22 DIAGNOSIS — D539 Nutritional anemia, unspecified: Secondary | ICD-10-CM

## 2019-07-22 DIAGNOSIS — M25559 Pain in unspecified hip: Secondary | ICD-10-CM | POA: Diagnosis not present

## 2019-07-22 DIAGNOSIS — N289 Disorder of kidney and ureter, unspecified: Secondary | ICD-10-CM | POA: Diagnosis not present

## 2019-07-22 NOTE — Progress Notes (Signed)
The patient c/o vertigo with upset stomach / nausea. She is due to see a Press photographer.

## 2019-07-22 NOTE — Progress Notes (Signed)
There were no vitals taken for this visit.   Subjective:    Patient ID: Bailey Ayala, female    DOB: Feb 16, 1941, 79 y.o.   MRN: [id number]  HPI: Bailey Ayala is a 79 y.o. female presenting for ER follow-up.  Chief Complaint  Patient presents with  . Hospitalization Follow-up  . Dizziness   ER FOLLOW UP Time since discharge: 5 days Hospital/facility: [facility] Diagnosis: vertigo Procedures/tests: CBC, BMP, lipase, hepatic function Consultants: ENT - outpatient New medications: ondansetron 71m daily prn Discharge instructions:  Follow up with pcp and ENT, taken zofran prn for nausea. Status: better  Patient reports she is feeling a lot better and she has an appointment with an ENT provider tomorrow.  She denies chest pain, shortness of breath, nausea/vomiting and dizziness since discharge from the hospital.  She has not had to take any zofran or meclizine because she has not had any of the symptoms.   She denies any other complaints or concerns today. Allergies  Allergen Reactions  . Oxycodone Other (See Comments)    Made her feel "shaky"  . Codeine Other (See Comments)    hallucinations    . Simvastatin Diarrhea and Nausea Only   Outpatient Encounter Medications as of 07/22/2019  Medication Sig  . atorvastatin (LIPITOR) 40 MG tablet Take 1 tablet (40 mg total) by mouth daily.  . benazepril (LOTENSIN) 20 MG tablet Take 1 tablet (20 mg total) by mouth daily.  . ferrous sulfate (FERROUSUL) 325 (65 FE) MG tablet Take 1 tablet (325 mg total) by mouth 3 (three) times daily with meals for 14 days.  .Marland Kitchenlevothyroxine (SYNTHROID, LEVOTHROID) 100 MCG tablet Take 1 tablet (100 mcg total) by mouth daily.  . meclizine (ANTIVERT) 25 MG tablet Take 1 tablet (25 mg total) by mouth 2 (two) times daily as needed for dizziness.  . methocarbamol (ROBAXIN) 500 MG tablet Take 1 tablet (500 mg total) by mouth every 6 (six) hours as needed for muscle spasms.  . metoprolol tartrate (LOPRESSOR)  50 MG tablet Take 1 tablet (50 mg total) by mouth 2 (two) times daily.  . Multiple Vitamin (MULTIVITAMIN WITH MINERALS) TABS tablet Take 1 tablet by mouth daily.  . ondansetron (ZOFRAN) 4 MG tablet Take 1 tablet (4 mg total) by mouth daily as needed.  . polyethylene glycol (MIRALAX / GLYCOLAX) 17 g packet Take 17 g by mouth 2 (two) times daily.  . rivaroxaban (XARELTO) 10 MG TABS tablet Take 1 tablet (10 mg total) by mouth daily.  .Marland Kitchentriamcinolone cream (KENALOG) 0.1 % Apply 1 application topically 2 (two) times daily. (Patient taking differently: Apply 1 application topically 2 (two) times daily as needed (skin irritation/rash.). )  . aspirin (ASPIRIN CHILDRENS) 81 MG chewable tablet Chew 1 tablet (81 mg total) by mouth 2 (two) times daily. Start the day after finishing the Xarelto. Take for 4 weeks, then resume regular dose. (Patient not taking: Reported on 07/22/2019)  . [DISCONTINUED] docusate sodium (COLACE) 100 MG capsule Take 1 capsule (100 mg total) by mouth 2 (two) times daily. (Patient not taking: Reported on 07/22/2019)  . [DISCONTINUED] HYDROcodone-acetaminophen (NORCO) 5-325 MG tablet Take 1-2 tablets by mouth every 4 (four) hours as needed for moderate pain or severe pain. (Patient not taking: Reported on 07/22/2019)   No facility-administered encounter medications on file as of 07/22/2019.   Patient Active Problem List   Diagnosis Date Noted  . Vertigo 07/22/2019  . Overweight (BMI 25.0-29.9) 07/03/2019  . Acute blood loss anemia  07/03/2019  . S/P right THA, AA 07/02/2019  . History of COVID-19 04/11/2019  . Skin callus 04/08/2019  . Anemia in chronic kidney disease 01/18/2019  . Chronic kidney disease, stage III (moderate) 01/18/2019  . Multiple myeloma ([place]) 01/18/2019  . History of CVA (cerebrovascular accident) 11/28/2018  . CAD (coronary artery disease) 11/28/2018  . Abnormal stress test   . Pain due to onychomycosis of toenails of both feet 10/18/2018  . Left lower lobe  pulmonary nodule 08/18/2017  . Abdominal aortic aneurysm (AAA) without rupture ([place]) 08/18/2017  . Goals of care, counseling/discussion 08/18/2017  . Benign hypertensive heart and CKD, stage 3 (GFR 30-59), w CHF ([place]) 05/29/2017  . Drug-induced constipation 11/07/2016  . Primary osteoarthritis involving multiple joints 11/07/2016  . Simple chronic bronchitis ([place]) 11/07/2016  . Advanced care planning/counseling discussion 11/02/2016  . S/P shoulder replacement 01/14/2016  . Hypothyroid 11/04/2014  . Hyperlipidemia   . Ulcerated varicose veins of leg, right ([place]) 09/30/2014  . Peripheral arterial occlusive disease ([place]) 09/30/2014   Past Medical History:  Diagnosis Date  . AAA (abdominal aortic aneurysm) ([place])    The largest aortic measurement is 4.0 cm on 05/31/19  . Allergy 1980  . Anemia 09-2017  . Arthritis   . Bleeding from varicose vein   . Blood dyscrasia    BLEEDS EASILY   . Blood transfusion without reported diagnosis 2011 or 12  . Cataract ?  . CHF (congestive heart failure) ([place]) ?  . CKD (chronic kidney disease), stage III    Moderate  . Clotting disorder ([place]) ?  Marland Kitchen COPD (chronic obstructive pulmonary disease) ([place])   . Coronary artery disease   . Diabetes mellitus without complication ([place])    patient denies  . Diffuse cystic mastopathy   . Emphysema of lung ([place]) ?  Marland Kitchen Emphysema of lung ([place])   . Heartburn   . Hyperlipidemia   . Hypertension   . Hypothyroidism   . Incomplete left bundle branch block (LBBB) 06/22/2019   Noted on EKG  . L4-L5 disc bulge   . Multiple myeloma ([place])   . Myocardial infarction ([place]) 2019  . Osteoporosis, post-menopausal   . PAD (peripheral artery disease) ([place])   . Renal insufficiency   . Shortness of breath dyspnea    WITH EXERTION   . Stroke ([place])    TIA     2016     WAS FOUND ON SCAN ORDERED FROM NEUROL  . Thyroid disease   . TIA (transient ischemic attack) 2016   left hand weakness  . Ulnar nerve compression, left   .  Vertigo 06/22/2019   doing much today 06/28/2019   Relevant past medical, surgical, family and social history reviewed and updated as indicated. Interim medical history since our last visit reviewed. Allergies and medications reviewed and updated.  Review of Systems  Constitutional: Negative.  Negative for activity change, appetite change, fatigue and fever.  Respiratory: Negative.  Negative for shortness of breath.   Cardiovascular: Negative.  Negative for chest pain.  Gastrointestinal: Negative.  Negative for nausea and vomiting.  Neurological: Negative.  Negative for dizziness, weakness, light-headedness, numbness and headaches.  Psychiatric/Behavioral: Negative.  Negative for confusion and sleep disturbance. The patient is not nervous/anxious.     Per HPI unless specifically indicated above     Objective:    There were no vitals taken for this visit.  Wt Readings from Last 3 Encounters:  07/17/19 170 lb (77.1 kg)  07/02/19 175 lb 6 oz (  79.5 kg)  06/28/19 175 lb 6 oz (79.5 kg)    Physical Exam  Physical examination unable to be performed due to lack of equipment.  Results for orders placed or performed during the hospital encounter of 60/63/01  Basic metabolic panel  Result Value Ref Range   Sodium 137 135 - 145 mmol/L   Potassium 4.7 3.5 - 5.1 mmol/L   Chloride 103 98 - 111 mmol/L   CO2 24 22 - 32 mmol/L   Glucose, Bld 133 (H) 70 - 99 mg/dL   BUN 27 (H) 8 - 23 mg/dL   Creatinine, Ser 1.34 (H) 0.44 - 1.00 mg/dL   Calcium 10.0 8.9 - 10.3 mg/dL   GFR calc non Af Amer 38 (L) >60 mL/min   GFR calc Af Amer 44 (L) >60 mL/min   Anion gap 10 5 - 15  CBC  Result Value Ref Range   WBC 5.1 4.0 - 10.5 K/uL   RBC 3.05 (L) 3.87 - 5.11 MIL/uL   Hemoglobin 9.9 (L) 12.0 - 15.0 g/dL   HCT 31.1 (L) 36.0 - 46.0 %   MCV 102.0 (H) 80.0 - 100.0 fL   MCH 32.5 26.0 - 34.0 pg   MCHC 31.8 30.0 - 36.0 g/dL   RDW 15.5 11.5 - 15.5 %   Platelets 359 150 - 400 K/uL   nRBC 0.0 0.0 - 0.2 %    Lipase, blood  Result Value Ref Range   Lipase 39 11 - 51 U/L  Hepatic function panel  Result Value Ref Range   Total Protein 8.9 (H) 6.5 - 8.1 g/dL   Albumin 3.8 3.5 - 5.0 g/dL   AST 19 15 - 41 U/L   ALT 12 0 - 44 U/L   Alkaline Phosphatase 84 38 - 126 U/L   Total Bilirubin 0.5 0.3 - 1.2 mg/dL   Bilirubin, Direct <0.1 0.0 - 0.2 mg/dL   Indirect Bilirubin NOT CALCULATED 0.3 - 0.9 mg/dL      Assessment & Plan:   Problem List Items Addressed This Visit      Other   Vertigo - Primary    Acute, stable.  Has not had to take prn medication for dizziness or nausea so far.  Discussed safety mechanisms when episodes come on.  Encouraged to keep follow up appointment with ENT tomorrow.  With any new symptoms or chest pain/shortness of breath, go to ER.            Follow up plan: Return if symptoms worsen or fail to improve.   This visit was completed via telephone due to the restrictions of the COVID-19 pandemic. All issues as above were discussed and addressed but no physical exam was performed. If it was felt that the patient should be evaluated in the office, they were directed there. The patient verbally consented to this visit. Patient was unable to complete an audio/visual visit due to Lack of equipment. . Location of the patient: home . Location of the provider: work . Those involved with this call:  . Provider: Carnella Guadalajara, DNP . CMA: Merilyn Baba, CMA . Front Desk/Registration: [facility]  . Time spent on call: 7 minutes on the phone discussing health concerns. 10 minutes total spent in review of patient's record and preparation of their chart.  I verified patient identity using two factors (patient name and date of birth). Patient consents verbally to being seen via telemedicine visit today.

## 2019-07-22 NOTE — Assessment & Plan Note (Signed)
Acute, stable.  Has not had to take prn medication for dizziness or nausea so far.  Discussed safety mechanisms when episodes come on.  Encouraged to keep follow up appointment with ENT tomorrow.  With any new symptoms or chest pain/shortness of breath, go to ER.

## 2019-07-22 NOTE — Patient Instructions (Signed)

## 2019-07-23 DIAGNOSIS — N183 Chronic kidney disease, stage 3 unspecified: Secondary | ICD-10-CM | POA: Diagnosis not present

## 2019-07-23 DIAGNOSIS — Z471 Aftercare following joint replacement surgery: Secondary | ICD-10-CM | POA: Diagnosis not present

## 2019-07-23 DIAGNOSIS — E1122 Type 2 diabetes mellitus with diabetic chronic kidney disease: Secondary | ICD-10-CM | POA: Diagnosis not present

## 2019-07-23 DIAGNOSIS — H9319 Tinnitus, unspecified ear: Secondary | ICD-10-CM | POA: Diagnosis not present

## 2019-07-23 DIAGNOSIS — H903 Sensorineural hearing loss, bilateral: Secondary | ICD-10-CM | POA: Diagnosis not present

## 2019-07-23 DIAGNOSIS — I509 Heart failure, unspecified: Secondary | ICD-10-CM | POA: Diagnosis not present

## 2019-07-23 DIAGNOSIS — R42 Dizziness and giddiness: Secondary | ICD-10-CM | POA: Diagnosis not present

## 2019-07-23 DIAGNOSIS — D631 Anemia in chronic kidney disease: Secondary | ICD-10-CM | POA: Diagnosis not present

## 2019-07-23 DIAGNOSIS — I130 Hypertensive heart and chronic kidney disease with heart failure and stage 1 through stage 4 chronic kidney disease, or unspecified chronic kidney disease: Secondary | ICD-10-CM | POA: Diagnosis not present

## 2019-07-25 ENCOUNTER — Other Ambulatory Visit: Payer: Self-pay

## 2019-07-25 ENCOUNTER — Ambulatory Visit (INDEPENDENT_AMBULATORY_CARE_PROVIDER_SITE_OTHER): Payer: Medicare Other | Admitting: Nurse Practitioner

## 2019-07-25 ENCOUNTER — Ambulatory Visit (INDEPENDENT_AMBULATORY_CARE_PROVIDER_SITE_OTHER): Payer: Medicare Other

## 2019-07-25 ENCOUNTER — Encounter (INDEPENDENT_AMBULATORY_CARE_PROVIDER_SITE_OTHER): Payer: Self-pay | Admitting: Nurse Practitioner

## 2019-07-25 VITALS — BP 172/75 | HR 71 | Resp 16 | Ht 65.0 in | Wt 175.0 lb

## 2019-07-25 DIAGNOSIS — I83019 Varicose veins of right lower extremity with ulcer of unspecified site: Secondary | ICD-10-CM | POA: Diagnosis not present

## 2019-07-25 DIAGNOSIS — I130 Hypertensive heart and chronic kidney disease with heart failure and stage 1 through stage 4 chronic kidney disease, or unspecified chronic kidney disease: Secondary | ICD-10-CM | POA: Diagnosis not present

## 2019-07-25 DIAGNOSIS — L97919 Non-pressure chronic ulcer of unspecified part of right lower leg with unspecified severity: Secondary | ICD-10-CM | POA: Diagnosis not present

## 2019-07-25 DIAGNOSIS — D631 Anemia in chronic kidney disease: Secondary | ICD-10-CM | POA: Diagnosis not present

## 2019-07-25 DIAGNOSIS — N183 Chronic kidney disease, stage 3 unspecified: Secondary | ICD-10-CM | POA: Diagnosis not present

## 2019-07-25 DIAGNOSIS — M8949 Other hypertrophic osteoarthropathy, multiple sites: Secondary | ICD-10-CM | POA: Diagnosis not present

## 2019-07-25 DIAGNOSIS — I83891 Varicose veins of right lower extremities with other complications: Secondary | ICD-10-CM

## 2019-07-25 DIAGNOSIS — Z471 Aftercare following joint replacement surgery: Secondary | ICD-10-CM | POA: Diagnosis not present

## 2019-07-25 DIAGNOSIS — I509 Heart failure, unspecified: Secondary | ICD-10-CM | POA: Diagnosis not present

## 2019-07-25 DIAGNOSIS — E1122 Type 2 diabetes mellitus with diabetic chronic kidney disease: Secondary | ICD-10-CM | POA: Diagnosis not present

## 2019-07-25 DIAGNOSIS — M159 Polyosteoarthritis, unspecified: Secondary | ICD-10-CM

## 2019-07-25 NOTE — Progress Notes (Signed)
Subjective:    Patient ID: Bailey Ayala, female    DOB: 04-Feb-1941, 79 y.o.   MRN: [id number] Chief Complaint  Patient presents with  . Follow-up    ultrasound follow up     Bailey Ayala is a 79 y.o. female.  The patient returns to the office for evaluation of bleeding varicosities.  These bleeding varicosities occur at the right medial ankle.  The patient has had 2 episodes which have prompted rescue squad calls or hospital visits for bleeding from varicose veins on the right leg.  The most recent one has a scab remaining where it was a couple of weeks ago.  This is just below the right medial ankle.  It involves significant underlying varicosities in that area.  The patient she has had previous intervention on the right lower extremity and based off of her description it may be sclerotherapy.  The patient has a history of left great saphenous vein closure.  The patient also has known PAD and abdominal aortic aneurysm diagnosed and followed by her cardiologist.  She is also followed and known to have what she describes as 100% blockage in her right SFA and a 75% blockage in her left SFA.  She also had a 4 cm abdominal aortic aneurysm at last ultrasound.  She does not have a lot of swelling.    The patient continues to have pain in the lower extremities with dependency. The pain is lessened with elevation. Graduated compression stockings, Class I (20-30 mmHg), have been worn but the stockings do not eliminate the leg pain. Over-the-counter analgesics do not improve the symptoms.  The patient has worn medical grade 1 compression stockings for years in addition to utilize conservative therapy.  Venous ultrasound shows normal deep venous system, no evidence of acute or chronic DVT.  Superficial reflux is present in the right great saphenous vein from the proximal thigh to the proximal calf.  Patient also has deep venous reflux in the right femoral vein.   Review of Systems  Cardiovascular:         Bleeding from varicosities   Musculoskeletal: Positive for arthralgias.  Skin: Positive for wound.  Neurological: Positive for weakness.  All other systems reviewed and are negative.      Objective:   Physical Exam HENT:     Head: Normocephalic.     Nose: Nose normal.  Eyes:     Conjunctiva/sclera: Conjunctivae normal.  Cardiovascular:     Rate and Rhythm: Normal rate.     Comments: Scattered varicosities with several larger varicosities near ankle/foot of right lower extremity  Neurological:     General: No focal deficit present.     Mental Status: She is alert and oriented to person, place, and time.  Psychiatric:        Mood and Affect: Mood normal.        Behavior: Behavior normal.        Thought Content: Thought content normal.        Judgment: Judgment normal.     BP (!) 172/75 (BP Location: Right Arm)   Pulse 71   Resp 16   Ht '5\' 5"'  (1.651 m)   Wt 175 lb (79.4 kg)   BMI 29.12 kg/m   Past Medical History:  Diagnosis Date  . AAA (abdominal aortic aneurysm) ([place])    The largest aortic measurement is 4.0 cm on 05/31/19  . Allergy 1980  . Anemia 09-2017  . Arthritis   . Bleeding from  varicose vein   . Blood dyscrasia    BLEEDS EASILY   . Blood transfusion without reported diagnosis 2011 or 12  . Cataract ?  . CHF (congestive heart failure) ([place]) ?  . CKD (chronic kidney disease), stage III    Moderate  . Clotting disorder ([place]) ?  Marland Kitchen COPD (chronic obstructive pulmonary disease) ([place])   . Coronary artery disease   . Diabetes mellitus without complication ([place])    patient denies  . Diffuse cystic mastopathy   . Emphysema of lung ([place]) ?  Marland Kitchen Emphysema of lung ([place])   . Heartburn   . Hyperlipidemia   . Hypertension   . Hypothyroidism   . Incomplete left bundle branch block (LBBB) 06/22/2019   Noted on EKG  . L4-L5 disc bulge   . Multiple myeloma ([place])   . Myocardial infarction ([place]) 2019  . Osteoporosis, post-menopausal   . PAD (peripheral artery  disease) ([place])   . Renal insufficiency   . Shortness of breath dyspnea    WITH EXERTION   . Stroke ([place])    TIA     2016     WAS FOUND ON SCAN ORDERED FROM NEUROL  . Thyroid disease   . TIA (transient ischemic attack) 2016   left hand weakness  . Ulnar nerve compression, left   . Vertigo 06/22/2019   doing much today 06/28/2019    Social History   Socioeconomic History  . Marital status: Widowed    Spouse name: Not on file  . Number of children: Not on file  . Years of education: Not on file  . Highest education level: High school graduate  Occupational History  . Not on file  Tobacco Use  . Smoking status: Former Smoker    Packs/day: 3.00    Years: 25.00    Pack years: 75.00    Types: Cigarettes    Quit date: 11/03/1989    Years since quitting: 29.7  . Smokeless tobacco: Never Used  Substance and Sexual Activity  . Alcohol use: Not Currently    Alcohol/week: 0.0 standard drinks  . Drug use: No  . Sexual activity: Not Currently    Birth control/protection: None, Surgical  Other Topics Concern  . Not on file  Social History Narrative  . Not on file   Social Determinants of Health   Financial Resource Strain:   . Difficulty of Paying Living Expenses:   Food Insecurity:   . Worried About Charity fundraiser in the Last Year:   . Arboriculturist in the Last Year:   Transportation Needs:   . Film/video editor (Medical):   Marland Kitchen Lack of Transportation (Non-Medical):   Physical Activity:   . Days of Exercise per Week:   . Minutes of Exercise per Session:   Stress:   . Feeling of Stress :   Social Connections:   . Frequency of Communication with Friends and Family:   . Frequency of Social Gatherings with Friends and Family:   . Attends Religious Services:   . Active Member of Clubs or Organizations:   . Attends Archivist Meetings:   Marland Kitchen Marital Status:   Intimate Partner Violence:   . Fear of Current or Ex-Partner:   . Emotionally Abused:   Marland Kitchen  Physically Abused:   . Sexually Abused:     Past Surgical History:  Procedure Laterality Date  . ABDOMINAL HYSTERECTOMY  1990  . BACK SURGERY  2011  . BREAST EXCISIONAL BIOPSY Left  2010?   . BREAST SURGERY  1975   A lump removed left breast  . CATARACT EXTRACTION, BILATERAL    . COLONOSCOPY  1998  . EYE SURGERY Bilateral 05/06/14  . JOINT REPLACEMENT     LT KNEE  . KNEE SURGERY Left 2012  . LEFT HEART CATH AND CORONARY ANGIOGRAPHY Left 11/19/2018   Procedure: LEFT HEART CATH AND CORONARY ANGIOGRAPHY;  Surgeon: Wellington Hampshire, MD;  Location: [facility];  Service: Cardiovascular;  Laterality: Left;  . OOPHORECTOMY  1990  . parathyroid transplant     2/3 THYROID REMOVED   (GOITER)  . SPINE SURGERY    . THYROID SURGERY    . TONSILLECTOMY    . TOTAL HIP ARTHROPLASTY Right 07/02/2019   Procedure: TOTAL HIP ARTHROPLASTY ANTERIOR APPROACH;  Surgeon: Paralee Cancel, MD;  Location: [facility];  Service: Orthopedics;  Laterality: Right;  70 mins  . TOTAL SHOULDER ARTHROPLASTY Left 01/14/2016   Procedure: LEFT TOTAL SHOULDER ARTHROPLASTY;  Surgeon: Justice Britain, MD;  Location: [facility];  Service: Orthopedics;  Laterality: Left;  . TOTAL SHOULDER ARTHROPLASTY Right 11/03/2016   Procedure: RIGHT TOTAL SHOULDER ARTHROPLASTY;  Surgeon: Justice Britain, MD;  Location: [facility];  Service: Orthopedics;  Laterality: Right;  . ULNAR NERVE REPAIR     LEFT  . vein closure procedure Left 2010    Family History  Problem Relation Age of Onset  . Heart disease Mother        MI  . Arthritis Mother   . Stroke Mother   . Hyperlipidemia Mother   . Hypertension Mother   . Varicose Veins Mother   . Heart disease Father        MI  . Hyperlipidemia Father   . Arthritis Father   . Cancer Father   . COPD Father   . Hearing loss Father   . Hypertension Father   . Vision loss Father   . Diabetes Sister   . COPD Sister   . Heart disease Sister   . Hyperlipidemia Sister   . Hypertension Sister   .  Hyperlipidemia Brother   . COPD Brother   . Diabetes Brother   . Heart disease Brother   . Hypertension Brother   . Arthritis Sister   . Hyperlipidemia Sister   . Hypertension Sister   . Vision loss Sister   . Varicose Veins Sister   . Arthritis Brother   . Varicose Veins Brother   . Arthritis Daughter   . Arthritis Daughter   . Diabetes Daughter   . Cancer Paternal Aunt   . COPD Sister   . Heart disease Sister   . Hyperlipidemia Sister   . Hypertension Sister   . Varicose Veins Sister   . Heart disease Maternal Uncle   . Heart disease Paternal Uncle     Allergies  Allergen Reactions  . Oxycodone Other (See Comments)    Made her feel "shaky"  . Codeine Other (See Comments)    hallucinations    . Simvastatin Diarrhea and Nausea Only       Assessment & Plan:   1. Hemorrhage of varicose veins of lower extremity, right Recommend  I have reviewed my previous  discussion with the patient regarding  varicose veins and why they cause symptoms. Patient will continue  wearing graduated compression stockings class 1 on a daily basis, beginning first thing in the morning and removing them in the evening.    In addition, behavioral modification including elevation during the  day was again discussed and this will continue.  The patient has utilized over the counter pain medications and has been exercising.  However, at this time conservative therapy has not alleviated the patient's symptoms of leg pain and swelling.  The patient's continued reflux in her lower extremity also places her at great risk for subsequent hemorrhage events.    Recommend: laser ablation of the right great saphenous veins to eliminate the symptoms of pain and swelling of the lower extremities caused by the severe superficial venous reflux disease.   2. Primary osteoarthritis involving multiple joints Continue NSAID medications as already ordered, these medications have been reviewed and there are no  changes at this time.  Continued activity and therapy was stressed.    Current Outpatient Medications on File Prior to Visit  Medication Sig Dispense Refill  . atorvastatin (LIPITOR) 40 MG tablet Take 1 tablet (40 mg total) by mouth daily. 90 tablet 3  . benazepril (LOTENSIN) 20 MG tablet Take 1 tablet (20 mg total) by mouth daily. 90 tablet 4  . levothyroxine (SYNTHROID, LEVOTHROID) 100 MCG tablet Take 1 tablet (100 mcg total) by mouth daily. 90 tablet 4  . meclizine (ANTIVERT) 25 MG tablet Take 1 tablet (25 mg total) by mouth 2 (two) times daily as needed for dizziness. 15 tablet 0  . methocarbamol (ROBAXIN) 500 MG tablet Take 1 tablet (500 mg total) by mouth every 6 (six) hours as needed for muscle spasms. 40 tablet 0  . metoprolol tartrate (LOPRESSOR) 50 MG tablet Take 1 tablet (50 mg total) by mouth 2 (two) times daily. 60 tablet 11  . Multiple Vitamin (MULTIVITAMIN WITH MINERALS) TABS tablet Take 1 tablet by mouth daily.    . ondansetron (ZOFRAN) 4 MG tablet Take 1 tablet (4 mg total) by mouth daily as needed. 14 tablet 0  . polyethylene glycol (MIRALAX / GLYCOLAX) 17 g packet Take 17 g by mouth 2 (two) times daily. 28 packet 0  . rivaroxaban (XARELTO) 10 MG TABS tablet Take 1 tablet (10 mg total) by mouth daily. 14 tablet 0  . triamcinolone cream (KENALOG) 0.1 % Apply 1 application topically 2 (two) times daily. (Patient taking differently: Apply 1 application topically 2 (two) times daily as needed (skin irritation/rash.). ) 30 g 0  . aspirin (ASPIRIN CHILDRENS) 81 MG chewable tablet Chew 1 tablet (81 mg total) by mouth 2 (two) times daily. Start the day after finishing the Xarelto. Take for 4 weeks, then resume regular dose. (Patient not taking: Reported on 07/22/2019) 60 tablet 0  . ferrous sulfate (FERROUSUL) 325 (65 FE) MG tablet Take 1 tablet (325 mg total) by mouth 3 (three) times daily with meals for 14 days. 42 tablet 0   No current facility-administered medications on file prior  to visit.    There are no Patient Instructions on file for this visit. No follow-ups on file.   Kris Hartmann, NP

## 2019-07-29 DIAGNOSIS — D631 Anemia in chronic kidney disease: Secondary | ICD-10-CM | POA: Diagnosis not present

## 2019-07-29 DIAGNOSIS — I509 Heart failure, unspecified: Secondary | ICD-10-CM | POA: Diagnosis not present

## 2019-07-29 DIAGNOSIS — N183 Chronic kidney disease, stage 3 unspecified: Secondary | ICD-10-CM | POA: Diagnosis not present

## 2019-07-29 DIAGNOSIS — I130 Hypertensive heart and chronic kidney disease with heart failure and stage 1 through stage 4 chronic kidney disease, or unspecified chronic kidney disease: Secondary | ICD-10-CM | POA: Diagnosis not present

## 2019-07-29 DIAGNOSIS — Z471 Aftercare following joint replacement surgery: Secondary | ICD-10-CM | POA: Diagnosis not present

## 2019-07-29 DIAGNOSIS — E1122 Type 2 diabetes mellitus with diabetic chronic kidney disease: Secondary | ICD-10-CM | POA: Diagnosis not present

## 2019-08-01 ENCOUNTER — Encounter: Payer: Self-pay | Admitting: Hematology and Oncology

## 2019-08-01 DIAGNOSIS — N183 Chronic kidney disease, stage 3 unspecified: Secondary | ICD-10-CM | POA: Diagnosis not present

## 2019-08-01 DIAGNOSIS — I130 Hypertensive heart and chronic kidney disease with heart failure and stage 1 through stage 4 chronic kidney disease, or unspecified chronic kidney disease: Secondary | ICD-10-CM | POA: Diagnosis not present

## 2019-08-01 DIAGNOSIS — D631 Anemia in chronic kidney disease: Secondary | ICD-10-CM | POA: Diagnosis not present

## 2019-08-01 DIAGNOSIS — I509 Heart failure, unspecified: Secondary | ICD-10-CM | POA: Diagnosis not present

## 2019-08-01 DIAGNOSIS — E1122 Type 2 diabetes mellitus with diabetic chronic kidney disease: Secondary | ICD-10-CM | POA: Diagnosis not present

## 2019-08-01 DIAGNOSIS — Z471 Aftercare following joint replacement surgery: Secondary | ICD-10-CM | POA: Diagnosis not present

## 2019-08-02 ENCOUNTER — Telehealth: Payer: Self-pay

## 2019-08-02 MED ORDER — ACYCLOVIR 400 MG PO TABS: 400.0000 mg | ORAL_TABLET | Freq: Two times a day (BID) | ORAL | 3 refills | Status: DC

## 2019-08-02 MED ORDER — ONDANSETRON HCL 8 MG PO TABS: 8.0000 mg | ORAL_TABLET | Freq: Two times a day (BID) | ORAL | 1 refills | Status: DC | PRN

## 2019-08-02 MED ORDER — LENALIDOMIDE 10 MG PO CAPS: ORAL_CAPSULE | ORAL | 3 refills | Status: DC

## 2019-08-02 NOTE — Telephone Encounter (Signed)
Spoke with the patient to see if i can get her set up for the relvimid treatment and the patient refused to go forward with treatment at this time. Per Dr Alvia Grove she would like for Alyson to speak with the patient and explain how things work with the treatment. The patient just expressed that she feel like she will be more sicker than she is right now and do not want to proceed with the treatment. I have asked Alyson to reach out to the patient and she was understanding and agreeable to speak with the patient. I have also informed the patient that Dr Mike Gip has sent medication to the pharmacy and she would like for the patient to bring them in at her next visit. The patient states she will not go get them because she don't want to go forward with treatment at this time. I have informed Dr Mike Gip and she would like to schedule a visit with the patient next week to go over treatment plan.

## 2019-08-05 DIAGNOSIS — M150 Primary generalized (osteo)arthritis: Secondary | ICD-10-CM | POA: Diagnosis not present

## 2019-08-05 DIAGNOSIS — I8390 Asymptomatic varicose veins of unspecified lower extremity: Secondary | ICD-10-CM | POA: Diagnosis not present

## 2019-08-05 DIAGNOSIS — Z471 Aftercare following joint replacement surgery: Secondary | ICD-10-CM | POA: Diagnosis not present

## 2019-08-05 DIAGNOSIS — I252 Old myocardial infarction: Secondary | ICD-10-CM | POA: Diagnosis not present

## 2019-08-05 DIAGNOSIS — N183 Chronic kidney disease, stage 3 unspecified: Secondary | ICD-10-CM | POA: Diagnosis not present

## 2019-08-05 DIAGNOSIS — Z96611 Presence of right artificial shoulder joint: Secondary | ICD-10-CM | POA: Diagnosis not present

## 2019-08-05 DIAGNOSIS — M810 Age-related osteoporosis without current pathological fracture: Secondary | ICD-10-CM | POA: Diagnosis not present

## 2019-08-05 DIAGNOSIS — Z96641 Presence of right artificial hip joint: Secondary | ICD-10-CM | POA: Diagnosis not present

## 2019-08-05 DIAGNOSIS — I714 Abdominal aortic aneurysm, without rupture: Secondary | ICD-10-CM | POA: Diagnosis not present

## 2019-08-05 DIAGNOSIS — D689 Coagulation defect, unspecified: Secondary | ICD-10-CM | POA: Diagnosis not present

## 2019-08-05 DIAGNOSIS — R911 Solitary pulmonary nodule: Secondary | ICD-10-CM | POA: Diagnosis not present

## 2019-08-05 DIAGNOSIS — I130 Hypertensive heart and chronic kidney disease with heart failure and stage 1 through stage 4 chronic kidney disease, or unspecified chronic kidney disease: Secondary | ICD-10-CM | POA: Diagnosis not present

## 2019-08-05 DIAGNOSIS — E785 Hyperlipidemia, unspecified: Secondary | ICD-10-CM | POA: Diagnosis not present

## 2019-08-05 DIAGNOSIS — D631 Anemia in chronic kidney disease: Secondary | ICD-10-CM | POA: Diagnosis not present

## 2019-08-05 DIAGNOSIS — K5903 Drug induced constipation: Secondary | ICD-10-CM | POA: Diagnosis not present

## 2019-08-05 DIAGNOSIS — Z8616 Personal history of COVID-19: Secondary | ICD-10-CM | POA: Diagnosis not present

## 2019-08-05 DIAGNOSIS — Z96612 Presence of left artificial shoulder joint: Secondary | ICD-10-CM | POA: Diagnosis not present

## 2019-08-05 DIAGNOSIS — E1122 Type 2 diabetes mellitus with diabetic chronic kidney disease: Secondary | ICD-10-CM | POA: Diagnosis not present

## 2019-08-05 DIAGNOSIS — E039 Hypothyroidism, unspecified: Secondary | ICD-10-CM | POA: Diagnosis not present

## 2019-08-05 DIAGNOSIS — C9000 Multiple myeloma not having achieved remission: Secondary | ICD-10-CM | POA: Diagnosis not present

## 2019-08-05 DIAGNOSIS — I447 Left bundle-branch block, unspecified: Secondary | ICD-10-CM | POA: Diagnosis not present

## 2019-08-05 DIAGNOSIS — I2510 Atherosclerotic heart disease of native coronary artery without angina pectoris: Secondary | ICD-10-CM | POA: Diagnosis not present

## 2019-08-05 DIAGNOSIS — J439 Emphysema, unspecified: Secondary | ICD-10-CM | POA: Diagnosis not present

## 2019-08-05 DIAGNOSIS — E1151 Type 2 diabetes mellitus with diabetic peripheral angiopathy without gangrene: Secondary | ICD-10-CM | POA: Diagnosis not present

## 2019-08-05 DIAGNOSIS — I509 Heart failure, unspecified: Secondary | ICD-10-CM | POA: Diagnosis not present

## 2019-08-05 NOTE — Progress Notes (Signed)
[facility]  [address], [address] [place], [place] [address] Phone: [phone]  Fax: [phone]   Clinic Day:  08/07/2019  Referring physician: Guadalupe Maple, MD  Chief Complaint: Bailey Ayala is a 79 y.o. female with multiple myeloma who is seen for reassessment and ongoing discussion regarding treatment for multiple myeloma.  HPI: The patient was last seen in the medical oncology clinic on 07/22/2019. At that time, she was doing "ok".  She felt 40% like herself again. Her right hip  felt better s/p replacement; she noted right hip muscle pain. She had lost 8 pounds since her last visit.  She had chronic diarrhea. She had intermittent vertigo with an upset stomach and nausea. She denied any recent issues with bleeding varicose veins.   She was seen by Eulogio Ditch, NP at [facility] on 07/25/2019.  Lower venous reflux study of right leg revealed no evidence of DVT or superficial thrombosis in the right lower extremity.   There was venous reflux in the right greater saphenous vein in the thigh, right greater saphenous vein in the calf, and right femoral vein. Spider veins seen in ankle appeared to be where bleeding incidents occurred.  As conservative measures had not relieved her leg pain and swelling and reflux in her legs increased the risk of further hemorrhagic events, recommendation for laser ablation of the right great saphenous veins was made.    She was contacted to initiate preauthorization of Revlimid.  Patient noted that she had decided not to pursue therapy for her myeloma.  Symptomatically, she feels good today. Surgery made her feel a lot better. She is able to dress, shower, and use the restroom by herself. Her hip muscle has relaxed. She has vein surgery on 08/30/2019. She is considering not wanting to pursue Revlimid after reading the side effects because she says she does not want to be sick all the time. She fears  she will suffer from side effects such as low blood count, which she already has, and heart attack and stroke, which she has had both in the past. She also fears aneurysms, especially since she already has one. She has not spoken to the pharmacist Nuala Alpha.   She asked about Decadron and how it works. She would like to take some time to think about whether or not she wants to receive treatment.  She'd like to talk again after her vein surgery.    Past Medical History:  Diagnosis Date  . AAA (abdominal aortic aneurysm) ([place])    The largest aortic measurement is 4.0 cm on 05/31/19  . Allergy 1980  . Anemia 09-2017  . Arthritis   . Bleeding from varicose vein   . Blood dyscrasia    BLEEDS EASILY   . Blood transfusion without reported diagnosis 2011 or 12  . Cataract ?  . CHF (congestive heart failure) ([place]) ?  . CKD (chronic kidney disease), stage III    Moderate  . Clotting disorder ([place]) ?  Marland Kitchen COPD (chronic obstructive pulmonary disease) ([place])   . Coronary artery disease   . Diabetes mellitus without complication ([place])    patient denies  . Diffuse cystic mastopathy   . Emphysema of lung ([place]) ?  Marland Kitchen Emphysema of lung ([place])   . Heartburn   . Hyperlipidemia   . Hypertension   . Hypothyroidism   . Incomplete left bundle branch block (LBBB) 06/22/2019   Noted on EKG  . L4-L5 disc bulge   .  Multiple myeloma ([place])   . Myocardial infarction ([place]) 2019  . Osteoporosis, post-menopausal   . PAD (peripheral artery disease) ([place])   . Renal insufficiency   . Shortness of breath dyspnea    WITH EXERTION   . Stroke ([place])    TIA     2016     WAS FOUND ON SCAN ORDERED FROM NEUROL  . Thyroid disease   . TIA (transient ischemic attack) 2016   left hand weakness  . Ulnar nerve compression, left   . Vertigo 06/22/2019   doing much today 06/28/2019    Past Surgical History:  Procedure Laterality Date  . ABDOMINAL HYSTERECTOMY  1990  . BACK SURGERY  2011  . BREAST EXCISIONAL BIOPSY Left     2010?   Marland Kitchen BREAST SURGERY  1975   A lump removed left breast  . CATARACT EXTRACTION, BILATERAL    . COLONOSCOPY  1998  . EYE SURGERY Bilateral 05/06/14  . JOINT REPLACEMENT     LT KNEE  . KNEE SURGERY Left 2012  . LEFT HEART CATH AND CORONARY ANGIOGRAPHY Left 11/19/2018   Procedure: LEFT HEART CATH AND CORONARY ANGIOGRAPHY;  Surgeon: Wellington Hampshire, MD;  Location: [facility];  Service: Cardiovascular;  Laterality: Left;  . OOPHORECTOMY  1990  . parathyroid transplant     2/3 THYROID REMOVED   ([place])  . SPINE SURGERY    . THYROID SURGERY    . TONSILLECTOMY    . TOTAL HIP ARTHROPLASTY Right 07/02/2019   Procedure: TOTAL HIP ARTHROPLASTY ANTERIOR APPROACH;  Surgeon: Paralee Cancel, MD;  Location: [facility];  Service: Orthopedics;  Laterality: Right;  70 mins  . TOTAL SHOULDER ARTHROPLASTY Left 01/14/2016   Procedure: LEFT TOTAL SHOULDER ARTHROPLASTY;  Surgeon: Justice Britain, MD;  Location: [facility];  Service: Orthopedics;  Laterality: Left;  . TOTAL SHOULDER ARTHROPLASTY Right 11/03/2016   Procedure: RIGHT TOTAL SHOULDER ARTHROPLASTY;  Surgeon: Justice Britain, MD;  Location: [facility];  Service: Orthopedics;  Laterality: Right;  . ULNAR NERVE REPAIR     LEFT  . vein closure procedure Left 2010    Family History  Problem Relation Age of Onset  . Heart disease Mother        MI  . Arthritis Mother   . Stroke Mother   . Hyperlipidemia Mother   . Hypertension Mother   . Varicose Veins Mother   . Heart disease Father        MI  . Hyperlipidemia Father   . Arthritis Father   . Cancer Father   . COPD Father   . Hearing loss Father   . Hypertension Father   . Vision loss Father   . Diabetes Sister   . COPD Sister   . Heart disease Sister   . Hyperlipidemia Sister   . Hypertension Sister   . Hyperlipidemia Brother   . COPD Brother   . Diabetes Brother   . Heart disease Brother   . Hypertension Brother   . Arthritis Sister   . Hyperlipidemia Sister   . Hypertension Sister     . Vision loss Sister   . Varicose Veins Sister   . Arthritis Brother   . Varicose Veins Brother   . Arthritis Daughter   . Arthritis Daughter   . Diabetes Daughter   . Cancer Paternal Aunt   . COPD Sister   . Heart disease Sister   . Hyperlipidemia Sister   . Hypertension Sister   . Varicose Veins Sister   .  Heart disease Maternal Uncle   . Heart disease Paternal Uncle     Social History:  reports that she quit smoking about 29 years ago. Her smoking use included cigarettes. She has a 75.00 pack-year smoking history. She has never used smokeless tobacco. She reports previous alcohol use. She reports that she does not use drugs. Patient is a former 3 pack per day smoker for 25 years (75 pack year). Patient lives in [place]. She is a former Electrical engineer. Patient denies known exposures to radiation on toxins The patient is accompanied by her daughters on the iPad today.  Allergies:  Allergies  Allergen Reactions  . Oxycodone Other (See Comments)    Made her feel "shaky"  . Codeine Other (See Comments)    hallucinations    . Simvastatin Diarrhea and Nausea Only    Current Medications: Current Outpatient Medications  Medication Sig Dispense Refill  . atorvastatin (LIPITOR) 40 MG tablet Take 1 tablet (40 mg total) by mouth daily. 90 tablet 3  . benazepril (LOTENSIN) 20 MG tablet Take 1 tablet (20 mg total) by mouth daily. 90 tablet 4  . levothyroxine (SYNTHROID, LEVOTHROID) 100 MCG tablet Take 1 tablet (100 mcg total) by mouth daily. 90 tablet 4  . meclizine (ANTIVERT) 25 MG tablet Take 1 tablet (25 mg total) by mouth 2 (two) times daily as needed for dizziness. 15 tablet 0  . methocarbamol (ROBAXIN) 500 MG tablet Take 1 tablet (500 mg total) by mouth every 6 (six) hours as needed for muscle spasms. 40 tablet 0  . metoprolol tartrate (LOPRESSOR) 50 MG tablet Take 1 tablet (50 mg total) by mouth 2 (two) times daily. 60 tablet 11  . Multiple Vitamin (MULTIVITAMIN WITH MINERALS) TABS  tablet Take 1 tablet by mouth daily.    Marland Kitchen triamcinolone cream (KENALOG) 0.1 % Apply 1 application topically 2 (two) times daily. (Patient taking differently: Apply 1 application topically 2 (two) times daily as needed (skin irritation/rash.). ) 30 g 0  . acyclovir (ZOVIRAX) 400 MG tablet Take 1 tablet (400 mg total) by mouth 2 (two) times daily. (Patient not taking: Reported on 08/06/2019) 60 tablet 3  . ferrous sulfate (FERROUSUL) 325 (65 FE) MG tablet Take 1 tablet (325 mg total) by mouth 3 (three) times daily with meals for 14 days. 42 tablet 0  . lenalidomide (REVLIMID) 10 MG capsule Take 1 capsule daily on days 1-14 every 21 days. (Patient not taking: Reported on 08/06/2019) 14 capsule 3  . ondansetron (ZOFRAN) 4 MG tablet Take 1 tablet (4 mg total) by mouth daily as needed. (Patient not taking: Reported on 08/06/2019) 14 tablet 0  . ondansetron (ZOFRAN) 8 MG tablet Take 1 tablet (8 mg total) by mouth 2 (two) times daily as needed (Nausea or vomiting). (Patient not taking: Reported on 08/06/2019) 30 tablet 1  . polyethylene glycol (MIRALAX / GLYCOLAX) 17 g packet Take 17 g by mouth 2 (two) times daily. (Patient not taking: Reported on 08/06/2019) 28 packet 0  . rivaroxaban (XARELTO) 10 MG TABS tablet Take 1 tablet (10 mg total) by mouth daily. (Patient not taking: Reported on 08/06/2019) 14 tablet 0   No current facility-administered medications for this visit.    Review of Systems  Constitutional: Positive for weight loss (8 lb). Negative for diaphoresis, fever and malaise/fatigue.       Feeling good s/p surgery.  She is able to perform her ADLs. Ambulating with cane.  HENT: Negative for congestion, ear pain, hearing loss, sore throat and  tinnitus.   Eyes: Negative for blurred vision and double vision.  Respiratory: Negative for cough (chronic; stable), shortness of breath and wheezing.        COPD  Cardiovascular: Negative for chest pain, palpitations and leg swelling (varicose vein swelling).    Gastrointestinal: Positive for diarrhea (chronic) and nausea. Negative for constipation and vomiting.  Genitourinary: Negative for frequency and urgency.  Musculoskeletal: Positive for joint pain (right hip; better s/p right hip replacement) and myalgias (right hip muscles). Negative for back pain (chronic), falls and neck pain.  Skin: Negative for itching and rash.  Neurological: Negative for dizziness, weakness and headaches.       Aneurysm, near syncope episode on 06/22/2019. Vertigo comes and goes.   Endo/Heme/Allergies: Negative for environmental allergies. Does not bruise/bleed easily.       Hypothyroidism- on levothyroxine.   Psychiatric/Behavioral: Negative for depression. The patient is nervous/anxious. The patient does not have insomnia.   All other systems reviewed and are negative.  Performance status (ECOG): 2 - Symptomatic, <50% confined to bed  Vitals Blood pressure (!) 143/47, pulse 63, temperature 97.6 F (36.4 C), temperature source Tympanic, resp. rate 18, height '5\' 5"'$  (1.651 m), weight 168 lb 15.7 oz (76.6 kg), SpO2 100 %.   Physical Exam Vitals and nursing note reviewed.  Constitutional:      General: She is not in acute distress.    Appearance: Normal appearance. She is well-developed and well-nourished. She is not diaphoretic.     Interventions: Face mask in place.     Comments: She has a cane by her side. Patient needs minimal assistance on and off exam room table.  HENT:     Head: Normocephalic and atraumatic.     Right Ear: Hearing normal.     Left Ear: Hearing normal.     Mouth/Throat:     Mouth: Oropharynx is clear and moist and mucous membranes are normal. No oral lesions.      Comments: Brown hair pulled back. Mask. Eyes:     General: No scleral icterus.       Right eye: No discharge.        Left eye: No discharge.     Extraocular Movements: EOM normal.     Conjunctiva/sclera: Conjunctivae normal.     Pupils: Pupils are equal, round, and reactive  to light.     Comments: Blue eyes.   Neck:     Vascular: No JVD.  Cardiovascular:     Rate and Rhythm: Normal rate and regular rhythm.     Heart sounds: Normal heart sounds. No murmur heard.  No friction rub. No gallop.   Pulmonary:     Effort: Pulmonary effort is normal.     Breath sounds: Normal breath sounds. No wheezing, rhonchi or rales.  Abdominal:     General: Bowel sounds are normal. There is no distension.     Palpations: Abdomen is soft. There is no hepatosplenomegaly or mass.     Tenderness: There is no abdominal tenderness. There is no CVA tenderness, guarding or rebound.  Musculoskeletal:        General: Normal range of motion.     Cervical back: Normal range of motion and neck supple.  Lymphadenopathy:     Head:     Right side of head: No preauricular, posterior auricular or occipital adenopathy.     Left side of head: No preauricular, posterior auricular or occipital adenopathy.     Cervical: No cervical adenopathy.  Upper Body:  No axillary adenopathy present.    Right upper body: No supraclavicular adenopathy.     Left upper body: No supraclavicular adenopathy.     Lower Body: No right inguinal adenopathy. No left inguinal adenopathy.  Skin:    General: Skin is warm, dry and intact.     Coloration: Skin is not pale.     Findings: No bruising, erythema, lesion or rash.     Comments: Right lateral hip incision healing well.  Neurological:     Mental Status: She is alert and oriented to person, place, and time.  Psychiatric:        Mood and Affect: Mood and affect normal.        Behavior: Behavior normal.        Thought Content: Thought content normal.        Judgment: Judgment normal.     Comments: Tearful at times.    Appointment on 08/07/2019  Component Date Value Ref Range Status  . Sodium 08/07/2019 129* 135 - 145 mmol/L Final  . Potassium 08/07/2019 4.2  3.5 - 5.1 mmol/L Final  . Chloride 08/07/2019 98  98 - 111 mmol/L Final  . CO2 08/07/2019 24   22 - 32 mmol/L Final  . Glucose, Bld 08/07/2019 90  70 - 99 mg/dL Final   Glucose reference range applies only to samples taken after fasting for at least 8 hours.  . BUN 08/07/2019 30* 8 - 23 mg/dL Final  . Creatinine, Ser 08/07/2019 1.27* 0.44 - 1.00 mg/dL Final  . Calcium 08/07/2019 9.0  8.9 - 10.3 mg/dL Final  . Total Protein 08/07/2019 8.8* 6.5 - 8.1 g/dL Final  . Albumin 08/07/2019 4.0  3.5 - 5.0 g/dL Final  . AST 08/07/2019 19  15 - 41 U/L Final  . ALT 08/07/2019 15  0 - 44 U/L Final  . Alkaline Phosphatase 08/07/2019 79  38 - 126 U/L Final  . Total Bilirubin 08/07/2019 0.1* 0.3 - 1.2 mg/dL Final  . GFR calc non Af Amer 08/07/2019 40* >60 mL/min Final  . GFR calc Af Amer 08/07/2019 47* >60 mL/min Final  . Anion gap 08/07/2019 7  5 - 15 Final   Performed at [facility], [address], [place], [place] [address]  . WBC 08/07/2019 4.9  4.0 - 10.5 K/uL Final  . RBC 08/07/2019 2.82* 3.87 - 5.11 MIL/uL Final  . Hemoglobin 08/07/2019 9.5* 12.0 - 15.0 g/dL Final  . HCT 08/07/2019 29.0* 36.0 - 46.0 % Final  . MCV 08/07/2019 102.8* 80.0 - 100.0 fL Final  . MCH 08/07/2019 33.7  26.0 - 34.0 pg Final  . MCHC 08/07/2019 32.8  30.0 - 36.0 g/dL Final  . RDW 08/07/2019 16.1* 11.5 - 15.5 % Final  . Platelets 08/07/2019 226  150 - 400 K/uL Final  . nRBC 08/07/2019 0.0  0.0 - 0.2 % Final  . Neutrophils Relative % 08/07/2019 59  % Final  . Neutro Abs 08/07/2019 2.9  1.7 - 7.7 K/uL Final  . Lymphocytes Relative 08/07/2019 26  % Final  . Lymphs Abs 08/07/2019 1.3  0.7 - 4.0 K/uL Final  . Monocytes Relative 08/07/2019 10  % Final  . Monocytes Absolute 08/07/2019 0.5  0.1 - 1.0 K/uL Final  . Eosinophils Relative 08/07/2019 5  % Final  . Eosinophils Absolute 08/07/2019 0.3  0.0 - 0.5 K/uL Final  . Basophils Relative 08/07/2019 0  % Final  . Basophils Absolute 08/07/2019 0.0  0.0 - 0.1  K/uL Final  . Immature Granulocytes 08/07/2019 0  % Final  . Abs Immature Granulocytes 08/07/2019  0.02  0.00 - 0.07 K/uL Final   Performed at [facility], [address] [place], [place] [address]    Assessment:  [name] is a 79 y.o. female with smoldering multiple myeloma. SPEPon 06/21/2017 revealed a 1.2 gm/dL monoclonal spike. Random urine revealed 4.6 mg/dL protein and a 40.6% M-spike. Calcium, albumin, and serum protein were normal.  Work-up on 03/08/2019revealed a hematocrit of 31.1, hemoglobin 10.4, MCV 97.4, platelets 227,000, WBC 5700 with an ANC of 3300. SPEPrevealed a 1.1 gm/dL IgG monoclonal protein with lambda light chain specificity and monoclonal free lambda light chains(Bence-Jones protein). IgG was 1660 ([phone]) and IgA was 48 (64-422). Kappa free light chains were 23.7, lambda free light chains were 142.2 and ratio 0.17 (0.26-1.65). Beta2-microglobulin was 4.7 (0.6-2.4). Normal studies included: B12, folate, and ferritin (75). TSHwas 0.204 (low). 24 hour UPEPrevealed kappa free light chains 28.80 mg/L, lambda free light chains 63.80 mg/L and ration 0.45 (2.04 - 10.37). M spike was 27.4% (16 mg/24 hours).  Bone surveyon 06/30/2017 revealed no suspicious focal bony lesions.  Bone marrow aspirate and biopsyon 07/26/2017 revealed a slightly hypercellular marrow for age with trilineage hematopoiesis. There was 13% plasmacytosis(10-15% byCD138IHC). Plasma cells were ininterstitial cells and small clusters. Lambda light chains appeared restricted. Features were felt compatible with plasma cell neoplasm.  Bone marrow aspirate and biopsy on 05/29/2019 revealed a normocellularmarrow for age (20-30%) withincreased monoclonal plasma cells (15% aspirate, 20-30% CD138by IHC).Plasma cells were lambda restricted.There were no myelodysplastic changes.Storage iron was present. Flow cytometry revealed no monoclonal B-cell or phenotypically aberrant T-cell population. Findings were consistent with persistent plasma cell  myeloma.Peripheral bloodrevealed amacrocytic anemia with rouleaux formation.Cytogenetics were normal (61, XX). FISH for MDSrevealed gain of KMT2A (MLL) or chromosome 11/11q (69F.5.0%, normal <3.1%).FISH for myeloma revealed a deletion of NF1 or chromosome 17q11 (2R1G, 89%, not seen in validation studies).  PET scanon 08/14/2017 revealed no hypermetabolic lesions of myeloma. There was a 3.6 cm infrarenal aortic aneurysm. Ultrasound in 2 years was recommended. She had a 4 mm LLL noduletoo small to characterize.  SPEPhas been followed: 1.1 on 06/30/2017, 1.2 on 11/14/2017, 1.3 on 01/26/2018, 1.2 on 05/10/2018, 1.4 on 08/08/2018, 1.2 on 11/09/2018, 1.4 on 02/11/2019, 1.6 on 05/09/2019, and 1.6 on 08/07/2019. IgGhas been followed: 1660 on 06/30/2017, 1684 on 11/14/2017, 1795 on 01/26/2018, and 2054 on 08/08/2018.  Lambda free light chainshave been followed:142.2 (ratio 0.17)on03/11/2017, 214.4(ratio 0.09)on01/16/2020, 209.3(ratio 0.08)on04/14/2020, 220.2(ratio 0.07)on07/17/2020, 228.9(ratio 0.08)on 02/11/2019,258.4 (ratio 0.07)on01/14/2021, and 341.6 (0.05) on 08/07/2019.  Chest CTon 01/24/2018 revealed persistent LLL nodule. Area calcified and felt to be consistent with a granuloma. There were no other suspicious pulmonary nodules. There was new Schmorl's node formation involving the inferior endplate of I50. There were stable small scattered osseous lesions related to underlying multiple myeloma.  PET scanon 05/15/2018 revealed nohypermetabolic lesions characteristic of active myeloma. There was stable hypermetabolic apical wall of the left ventricle, there waspotentially a small pseudoaneurysmin this vicinity, but no appreciable mass was noted on 01/24/2018. There was aninfrarenal abdominal aortic aneurysm, 3.6 cm in diameter. Follow-up ultrasound in 2 years was recommended. Other imaging findings of potential clinical significance: aortic  atherosclerosis and coronary atherosclerosis.  Bone survery on01/25/2021 showed numerous new lytic lesions throughout the skeleton including the posterior calvarium, bilateral humeri, bilateral iliac bones, left inferior pubic ramus, bilateral femurs.  She has stage III chronic kidney disease(Cr 2.25 on 05/27/2019).   She  has a macrocytic anemia. B12, folate, and TSH were normal on 05/10/2018. Retic was 1.6%. Ferritin and iron studies were normal on 05/10/2018. Appetiteremains poor. Last colonoscopywas >10 years ago (she declines repeat).  She has a 75 pack year smoking history. She has COPD. Chest CT without contraston 07/25/2017 revealed no suspicious pulmonary nodules.  She underwent total right hip replacement on 07/02/2019. She required 2 units of PRBCs.  Symptomatically, she continues to recover that is post hip replacement.  She is able to perform her ADLs.  She is unsure if she wishes to pursue treatment for myeloma  Plan: 1.  Review interval labs. 2.Multiple myeloma Symptomatically,she is doing well. M spikeis 1.6 today (stable). Lambda free light chains341.6 (0.05) on 08/07/2019. Bone surveyon 05/20/2019 revealednumerous new lytic lesions throughout the skeleton. Bone marrow aspirateon 05/29/2019 revealed anormocellularmarrow for age (20-30%) withincreased monoclonal plasma cells (20-30% CD138IHC). Cytogenetics were normal (66, XX).  FISH for myeloma revealed a deletion of NF1 or chromosome 17q11 (2R1G, 89%, not seen in validation studies). Marrow plasmacytosis increased from 10-15% to 20-30%since 07/2017. CRAB criteria: Patient hasanemia (Hgb 9.1)and lytic bone lesions.  Calcium is 9.0 (normal).  Creatinine appears stable. Treatment plan reviewed: Patient is not a transplant candidate given her age and comorbidities.  VRd (Velcade 1.3 mg/m2 SQ day 1,4,8,11, Revlimid  po day 1-14, and Decadron 20 mg po on day 1,2,4,5,8,9,11 and 12) every 21 days for 8 cycles.  ConsiderVRd with weekly Velcade (day 1,8,15)andDecadron based on tolerance. Patient will then receivemaintenance Rd until progression or toxicity. Potential side effects of Velcade, Revlimid and Decadron we reviewed   Discuss patient's concerns about treatment. She currently wishes to postpone treatment at this time. Discuss consideration of referral to [facility] or [facility] for second opinion. Discuss body system and talking to another patient who was received similar therapy. Multiple questions were asked and answered. 4. Renal insufficieny Creatinine 1.27. Baseline creatinine in the past 6 months has ranged between 1.27-1.60.  Continue to monitor. 5. Macrocytic anemia Hematocrit 28.7. Hemoglobin 9.1. MCV 108.3 on 05/29/2019. Hematocrit 28.7. Hemoglobin 9.2. MCV 106.3 on 06/22/2019.  Hematocrit 29.0.  Hemoglobin 9.5.  MCV 102.8 on 08/07/2019. B12was 911 and folate 60.9. on 05/16/2019. She has no liver disease. Bone marrow on02/03/2021no evidence of a myelodysplastic syndrome. 6.Vascular issues in feet Patient notes significant blood loss secondary to fragile blood vessels in feet. Review interval note by vascular surgery and possible consideration of laser treatment. 7. RN to call patient in 2 weeks regarding her thoughts about [facility] or [facility] referral or talking to a patient who has received RVD treatment. 8.   RTC on 09/06/2019 for MD assessment (telephone or WebEx or MyChart) and further discussion regarding direction of therapy.  I discussed the assessment and treatment plan with the patient.  The patient was provided an opportunity to ask questions and all were answered.  The patient agreed with the plan  and demonstrated an understanding of the instructions.  The patient was advised to call back if the symptoms worsen or if the condition fails to improve as anticipated.  I provided 35 minutes (3:55 PM - 4:30 PM) of face-to-face time during this this encounter and > 50% was spent counseling as documented under my assessment and plan. An additional 5-7 minutes were spent reviewing her chart (Epic and Care Everywhere) including notes, labs, and imaging studies.    Bailey Asal, MD, PhD    08/07/2019, 3:55 PM  I, Bailey Ayala, am acting as Education administrator for [employer]. Mike Gip,  MD, PhD.  I, Bailey Augustus C. Mike Gip, MD, have reviewed the above documentation for accuracy and completeness, and I agree with the above.

## 2019-08-06 ENCOUNTER — Other Ambulatory Visit: Payer: Self-pay

## 2019-08-06 DIAGNOSIS — I509 Heart failure, unspecified: Secondary | ICD-10-CM | POA: Diagnosis not present

## 2019-08-06 DIAGNOSIS — I130 Hypertensive heart and chronic kidney disease with heart failure and stage 1 through stage 4 chronic kidney disease, or unspecified chronic kidney disease: Secondary | ICD-10-CM | POA: Diagnosis not present

## 2019-08-06 DIAGNOSIS — E1122 Type 2 diabetes mellitus with diabetic chronic kidney disease: Secondary | ICD-10-CM | POA: Diagnosis not present

## 2019-08-06 DIAGNOSIS — D631 Anemia in chronic kidney disease: Secondary | ICD-10-CM | POA: Diagnosis not present

## 2019-08-06 DIAGNOSIS — N183 Chronic kidney disease, stage 3 unspecified: Secondary | ICD-10-CM | POA: Diagnosis not present

## 2019-08-06 DIAGNOSIS — Z471 Aftercare following joint replacement surgery: Secondary | ICD-10-CM | POA: Diagnosis not present

## 2019-08-06 NOTE — Progress Notes (Signed)
Confirmed patient name and DOB. Patient has no questions or concerns at this time. She states she recently had hip surgery (07/02/19).

## 2019-08-07 ENCOUNTER — Inpatient Hospital Stay (HOSPITAL_BASED_OUTPATIENT_CLINIC_OR_DEPARTMENT_OTHER): Payer: Medicare Other | Admitting: Hematology and Oncology

## 2019-08-07 ENCOUNTER — Encounter: Payer: Self-pay | Admitting: Hematology and Oncology

## 2019-08-07 ENCOUNTER — Inpatient Hospital Stay: Payer: Medicare Other | Attending: Hematology and Oncology

## 2019-08-07 VITALS — BP 143/47 | HR 63 | Temp 97.6°F | Resp 18 | Ht 65.0 in | Wt 169.0 lb

## 2019-08-07 DIAGNOSIS — C9000 Multiple myeloma not having achieved remission: Secondary | ICD-10-CM | POA: Insufficient documentation

## 2019-08-07 DIAGNOSIS — I779 Disorder of arteries and arterioles, unspecified: Secondary | ICD-10-CM | POA: Diagnosis not present

## 2019-08-07 DIAGNOSIS — L97919 Non-pressure chronic ulcer of unspecified part of right lower leg with unspecified severity: Secondary | ICD-10-CM

## 2019-08-07 DIAGNOSIS — D539 Nutritional anemia, unspecified: Secondary | ICD-10-CM | POA: Diagnosis not present

## 2019-08-07 DIAGNOSIS — D472 Monoclonal gammopathy: Secondary | ICD-10-CM

## 2019-08-07 DIAGNOSIS — I83019 Varicose veins of right lower extremity with ulcer of unspecified site: Secondary | ICD-10-CM | POA: Diagnosis not present

## 2019-08-07 DIAGNOSIS — Z7189 Other specified counseling: Secondary | ICD-10-CM | POA: Diagnosis not present

## 2019-08-07 LAB — CBC WITH DIFFERENTIAL/PLATELET
Abs Immature Granulocytes: 0.02 10*3/uL (ref 0.00–0.07)
Basophils Absolute: 0 10*3/uL (ref 0.0–0.1)
Basophils Relative: 0 %
Eosinophils Absolute: 0.3 10*3/uL (ref 0.0–0.5)
Eosinophils Relative: 5 %
HCT: 29 % — ABNORMAL LOW (ref 36.0–46.0)
Hemoglobin: 9.5 g/dL — ABNORMAL LOW (ref 12.0–15.0)
Immature Granulocytes: 0 %
Lymphocytes Relative: 26 %
Lymphs Abs: 1.3 10*3/uL (ref 0.7–4.0)
MCH: 33.7 pg (ref 26.0–34.0)
MCHC: 32.8 g/dL (ref 30.0–36.0)
MCV: 102.8 fL — ABNORMAL HIGH (ref 80.0–100.0)
Monocytes Absolute: 0.5 10*3/uL (ref 0.1–1.0)
Monocytes Relative: 10 %
Neutro Abs: 2.9 10*3/uL (ref 1.7–7.7)
Neutrophils Relative %: 59 %
Platelets: 226 10*3/uL (ref 150–400)
RBC: 2.82 MIL/uL — ABNORMAL LOW (ref 3.87–5.11)
RDW: 16.1 % — ABNORMAL HIGH (ref 11.5–15.5)
WBC: 4.9 10*3/uL (ref 4.0–10.5)
nRBC: 0 % (ref 0.0–0.2)

## 2019-08-07 LAB — COMPREHENSIVE METABOLIC PANEL
ALT: 15 U/L (ref 0–44)
AST: 19 U/L (ref 15–41)
Albumin: 4 g/dL (ref 3.5–5.0)
Alkaline Phosphatase: 79 U/L (ref 38–126)
Anion gap: 7 (ref 5–15)
BUN: 30 mg/dL — ABNORMAL HIGH (ref 8–23)
CO2: 24 mmol/L (ref 22–32)
Calcium: 9 mg/dL (ref 8.9–10.3)
Chloride: 98 mmol/L (ref 98–111)
Creatinine, Ser: 1.27 mg/dL — ABNORMAL HIGH (ref 0.44–1.00)
GFR calc Af Amer: 47 mL/min — ABNORMAL LOW (ref >60)
GFR calc non Af Amer: 40 mL/min — ABNORMAL LOW (ref >60)
Glucose, Bld: 90 mg/dL (ref 70–99)
Potassium: 4.2 mmol/L (ref 3.5–5.1)
Sodium: 129 mmol/L — ABNORMAL LOW (ref 135–145)
Total Bilirubin: 0.1 mg/dL — ABNORMAL LOW (ref 0.3–1.2)
Total Protein: 8.8 g/dL — ABNORMAL HIGH (ref 6.5–8.1)

## 2019-08-07 NOTE — Progress Notes (Signed)
Weight loss 7 LB from last weight

## 2019-08-08 ENCOUNTER — Telehealth: Payer: Self-pay

## 2019-08-08 LAB — KAPPA/LAMBDA LIGHT CHAINS
Kappa free light chain: 15.9 mg/L (ref 3.3–19.4)
Kappa, lambda light chain ratio: 0.05 — ABNORMAL LOW (ref 0.26–1.65)
Lambda free light chains: 341.6 mg/L — ABNORMAL HIGH (ref 5.7–26.3)

## 2019-08-08 LAB — PROTEIN ELECTROPHORESIS, SERUM
A/G Ratio: 1 (ref 0.7–1.7)
Albumin ELP: 4.1 g/dL (ref 2.9–4.4)
Alpha-1-Globulin: 0.2 g/dL (ref 0.0–0.4)
Alpha-2-Globulin: 1 g/dL (ref 0.4–1.0)
Beta Globulin: 0.9 g/dL (ref 0.7–1.3)
Gamma Globulin: 2 g/dL — ABNORMAL HIGH (ref 0.4–1.8)
Globulin, Total: 4.2 g/dL — ABNORMAL HIGH (ref 2.2–3.9)
M-Spike, %: 1.6 g/dL — ABNORMAL HIGH
Total Protein ELP: 8.3 g/dL (ref 6.0–8.5)

## 2019-08-08 NOTE — Telephone Encounter (Signed)
Unable to reach the patient today. I will try to reach out to her again in the AM. I have faxed over a note to inform the MD that the patient sodium is low and Per Dr Mike Gip she would like for the labs to be reveiwed. Letter with labs was faxed to the patient PCP office [phone].

## 2019-08-09 DIAGNOSIS — Z471 Aftercare following joint replacement surgery: Secondary | ICD-10-CM | POA: Diagnosis not present

## 2019-08-09 DIAGNOSIS — D631 Anemia in chronic kidney disease: Secondary | ICD-10-CM | POA: Diagnosis not present

## 2019-08-09 DIAGNOSIS — N183 Chronic kidney disease, stage 3 unspecified: Secondary | ICD-10-CM | POA: Diagnosis not present

## 2019-08-09 DIAGNOSIS — I130 Hypertensive heart and chronic kidney disease with heart failure and stage 1 through stage 4 chronic kidney disease, or unspecified chronic kidney disease: Secondary | ICD-10-CM | POA: Diagnosis not present

## 2019-08-09 DIAGNOSIS — I509 Heart failure, unspecified: Secondary | ICD-10-CM | POA: Diagnosis not present

## 2019-08-09 DIAGNOSIS — E1122 Type 2 diabetes mellitus with diabetic chronic kidney disease: Secondary | ICD-10-CM | POA: Diagnosis not present

## 2019-08-12 ENCOUNTER — Other Ambulatory Visit: Payer: Medicare Other

## 2019-08-12 ENCOUNTER — Ambulatory Visit: Payer: Medicare Other | Admitting: Hematology and Oncology

## 2019-08-14 ENCOUNTER — Ambulatory Visit (INDEPENDENT_AMBULATORY_CARE_PROVIDER_SITE_OTHER): Payer: Medicare Other | Admitting: Nurse Practitioner

## 2019-08-14 ENCOUNTER — Other Ambulatory Visit: Payer: Self-pay

## 2019-08-14 ENCOUNTER — Encounter: Payer: Self-pay | Admitting: Nurse Practitioner

## 2019-08-14 VITALS — BP 138/84 | HR 66 | Temp 97.7°F | Wt 173.8 lb

## 2019-08-14 DIAGNOSIS — E871 Hypo-osmolality and hyponatremia: Secondary | ICD-10-CM | POA: Insufficient documentation

## 2019-08-14 DIAGNOSIS — C9000 Multiple myeloma not having achieved remission: Secondary | ICD-10-CM | POA: Diagnosis not present

## 2019-08-14 DIAGNOSIS — I2510 Atherosclerotic heart disease of native coronary artery without angina pectoris: Secondary | ICD-10-CM

## 2019-08-14 NOTE — Assessment & Plan Note (Signed)
Chronic, progressive.  She refuses treatment.  Wishes to focus on quality vs quantity of life.  Is a DNR.  Recommend referral to either palliative or hospice level care, she wishes to think about these.  Both would offer more support to her locally.  Continue collaboration with oncology team.

## 2019-08-14 NOTE — Progress Notes (Signed)
 BP 138/84 (BP Location: Left Arm)   Pulse 66   Temp 97.7 F (36.5 C) (Oral)   Wt 173 lb 12.8 oz (78.8 kg)   SpO2 95%   BMI 28.92 kg/m    Subjective:    Patient ID: Bailey Ayala, female    DOB: 02/12/1941, 79 y.o.   MRN: [id number]  HPI: Bailey Ayala is a 79 y.o. female  Chief Complaint  Patient presents with  . sodium leves   HYPONATREMIA: Was informed by Dr. Corcoran with oncology of recent NA+ level 129 on 08/07/2019.  On those labs CRT 1.27 and GFR 40.  On review of labs 133, 137, 135, 139, 134 in 2020 and 2021 -- tends to trend on low normal to lower side on review over past year.  Denies fatigue, dizziness, vomiting, diarrhea, gait issues, changes in memory, confusion, or muscle cramps.  States she does use salt often on her food at home, but is also cautious due to her HTN.  Does drink occasional water at home.  Her cancer (smoldering multiple myeloma) is progressing she states, but she is refusing treatment. Reports she does not like the side effects and has goal to focus on quality vs quantity of life.  She has two daughters who are aware of her choices and wishes not to obtain treatment.  Has a document stating her wishes and her daughter's have copy of this, per patient.  She wishes to be DNR.  She discussed how she researched multiple myeloma and read that life span is anywhere from 3 to 5 years and she has lived 3 years so far, but she knows this progresses and there is no cure.  Does not want to go through treatments and feel bad, would prefer to focus on quality of life.  Discussed at length with her options of palliative vs hospice level care at this time, as she reports not having a lot of support locally, her daughters live a distance away.  She wishes to think about these options further.  She plans to attend May appointment with oncology.  Relevant past medical, surgical, family and social history reviewed and updated as indicated. Interim medical history since  our last visit reviewed. Allergies and medications reviewed and updated.  Review of Systems  Constitutional: Negative for activity change, appetite change, diaphoresis, fatigue and fever.  Respiratory: Negative for cough, chest tightness and shortness of breath.   Cardiovascular: Negative for chest pain, palpitations and leg swelling.  Gastrointestinal: Negative.   Musculoskeletal: Negative.   Neurological: Negative.   Psychiatric/Behavioral: Negative.     Per HPI unless specifically indicated above     Objective:    BP 138/84 (BP Location: Left Arm)   Pulse 66   Temp 97.7 F (36.5 C) (Oral)   Wt 173 lb 12.8 oz (78.8 kg)   SpO2 95%   BMI 28.92 kg/m   Wt Readings from Last 3 Encounters:  08/14/19 173 lb 12.8 oz (78.8 kg)  08/07/19 168 lb 15.7 oz (76.6 kg)  07/25/19 175 lb (79.4 kg)    Physical Exam Vitals and nursing note reviewed.  Constitutional:      General: She is awake. She is not in acute distress.    Appearance: She is well-developed and well-groomed. She is not ill-appearing.  HENT:     Head: Normocephalic.     Right Ear: Hearing normal.     Left Ear: Hearing normal.  Eyes:     General: Lids are normal.          Right eye: No discharge.        Left eye: No discharge.     Conjunctiva/sclera: Conjunctivae normal.     Pupils: Pupils are equal, round, and reactive to light.  Neck:     Vascular: No carotid bruit.  Cardiovascular:     Rate and Rhythm: Normal rate and regular rhythm.     Heart sounds: Normal heart sounds. No murmur. No gallop.   Pulmonary:     Effort: Pulmonary effort is normal. No accessory muscle usage or respiratory distress.     Breath sounds: Normal breath sounds.  Abdominal:     General: Bowel sounds are normal.     Palpations: Abdomen is soft.  Musculoskeletal:     Cervical back: Normal range of motion and neck supple.     Right lower leg: No edema.     Left lower leg: No edema.  Skin:    General: Skin is warm and dry.    Neurological:     Mental Status: She is alert and oriented to person, place, and time.  Psychiatric:        Attention and Perception: Attention normal.        Mood and Affect: Mood normal.        Speech: Speech normal.        Behavior: Behavior normal. Behavior is cooperative.        Thought Content: Thought content normal.     Results for orders placed or performed in visit on 08/07/19  Protein electrophoresis, serum  Result Value Ref Range   Total Protein ELP 8.3 6.0 - 8.5 g/dL   Albumin ELP 4.1 2.9 - 4.4 g/dL   Alpha-1-Globulin 0.2 0.0 - 0.4 g/dL   Alpha-2-Globulin 1.0 0.4 - 1.0 g/dL   Beta Globulin 0.9 0.7 - 1.3 g/dL   Gamma Globulin 2.0 (H) 0.4 - 1.8 g/dL   M-Spike, % 1.6 (H) Not Observed g/dL   SPE Interp. Comment    Comment Comment    Globulin, Total 4.2 (H) 2.2 - 3.9 g/dL   A/G Ratio 1.0 0.7 - 1.7  Kappa/lambda light chains  Result Value Ref Range   Kappa free light chain 15.9 3.3 - 19.4 mg/L   Lamda free light chains 341.6 (H) 5.7 - 26.3 mg/L   Kappa, lamda light chain ratio 0.05 (L) 0.26 - 1.65  Comprehensive metabolic panel  Result Value Ref Range   Sodium 129 (L) 135 - 145 mmol/L   Potassium 4.2 3.5 - 5.1 mmol/L   Chloride 98 98 - 111 mmol/L   CO2 24 22 - 32 mmol/L   Glucose, Bld 90 70 - 99 mg/dL   BUN 30 (H) 8 - 23 mg/dL   Creatinine, Ser 1.27 (H) 0.44 - 1.00 mg/dL   Calcium 9.0 8.9 - 10.3 mg/dL   Total Protein 8.8 (H) 6.5 - 8.1 g/dL   Albumin 4.0 3.5 - 5.0 g/dL   AST 19 15 - 41 U/L   ALT 15 0 - 44 U/L   Alkaline Phosphatase 79 38 - 126 U/L   Total Bilirubin 0.1 (L) 0.3 - 1.2 mg/dL   GFR calc non Af Amer 40 (L) >60 mL/min   GFR calc Af Amer 47 (L) >60 mL/min   Anion gap 7 5 - 15  CBC with Differential/Platelet  Result Value Ref Range   WBC 4.9 4.0 - 10.5 K/uL   RBC 2.82 (L) 3.87 - 5.11 MIL/uL   Hemoglobin 9.5 (L) 12.0 - 15.0 g/dL     HCT 29.0 (L) 36.0 - 46.0 %   MCV 102.8 (H) 80.0 - 100.0 fL   MCH 33.7 26.0 - 34.0 pg   MCHC 32.8 30.0 - 36.0 g/dL    RDW 16.1 (H) 11.5 - 15.5 %   Platelets 226 150 - 400 K/uL   nRBC 0.0 0.0 - 0.2 %   Neutrophils Relative % 59 %   Neutro Abs 2.9 1.7 - 7.7 K/uL   Lymphocytes Relative 26 %   Lymphs Abs 1.3 0.7 - 4.0 K/uL   Monocytes Relative 10 %   Monocytes Absolute 0.5 0.1 - 1.0 K/uL   Eosinophils Relative 5 %   Eosinophils Absolute 0.3 0.0 - 0.5 K/uL   Basophils Relative 0 %   Basophils Absolute 0.0 0.0 - 0.1 K/uL   Immature Granulocytes 0 %   Abs Immature Granulocytes 0.02 0.00 - 0.07 K/uL      Assessment & Plan:   Problem List Items Addressed This Visit      Other   Multiple myeloma (HCC) - Primary    Chronic, progressive.  She refuses treatment.  Wishes to focus on quality vs quantity of life.  Is a DNR.  Recommend referral to either palliative or hospice level care, she wishes to think about these.  Both would offer more support to her locally.  Continue collaboration with oncology team.        Hyponatremia    Asymptomatic, on review chronic over past year on lower side normal or low 130 range.  Recent 129 with oncology.  Will recheck today and educated on symptoms to monitor for and present to ER if present.  Recommend adding a little more salt to diet daily and monitoring water intake.  If worsening levels or symptoms present will consider ER visit or will discuss with oncology if can get possible IV fluids outpatient.      Relevant Orders   Basic metabolic panel       Follow up plan: Return in about 2 months (around 10/14/2019) for Follow-up Myeloma. 

## 2019-08-14 NOTE — Assessment & Plan Note (Signed)
Asymptomatic, on review chronic over past year on lower side normal or low 130 range.  Recent 129 with oncology.  Will recheck today and educated on symptoms to monitor for and present to ER if present.  Recommend adding a little more salt to diet daily and monitoring water intake.  If worsening levels or symptoms present will consider ER visit or will discuss with oncology if can get possible IV fluids outpatient.

## 2019-08-14 NOTE — Patient Instructions (Signed)
Hyponatremia Hyponatremia is when the amount of salt (sodium) in your blood is too low. When salt levels are low, your body may take in extra water. This can cause swelling throughout the body. The swelling often affects the brain. What are the causes? This condition may be caused by:  Certain medical problems or conditions.  Vomiting a lot.  Having watery poop (diarrhea) often.  Certain medicines or illegal drugs.  Not having enough water in the body (dehydration).  Drinking too much water.  Eating a diet that is low in salt.  Large burns on your body.  Too much sweating. What increases the risk? You are more likely to get this condition if you:  Have long-term (chronic) kidney disease.  Have heart failure.  Have a medical condition that causes you to have watery poop often.  Do very hard exercises.  Take medicines that affect the amount of salt is in your blood. What are the signs or symptoms? Symptoms of this condition include:  Headache.  Feeling like you may vomit (nausea).  Vomiting.  Being very tired (lethargic).  Muscle weakness and cramps.  Not wanting to eat as much as normal (loss of appetite).  Feeling weak or light-headed. Severe symptoms of this condition include:  Confusion.  Feeling restless (agitation).  Having a fast heart rate.  Passing out (fainting).  Seizures.  Coma. How is this treated? Treatment for this condition depends on the cause. Treatment may include:  Getting fluids through an IV tube that is put into one of your veins.  Taking medicines to fix the salt levels in your blood. If medicines are causing the problem, your medicines will need to be changed.  Limiting how much water or fluid you take in.  Monitoring in the hospital to watch your symptoms. Follow these instructions at home:   Take over-the-counter and prescription medicines only as told by your doctor. Many medicines can make this condition worse.  Talk with your doctor about any medicines that you are taking.  Eat and drink exactly as you are told by your doctor. ? Eat only the foods you are told to eat. ? Limit how much fluid you take.  Do not drink alcohol.  Keep all follow-up visits as told by your doctor. This is important. Contact a doctor if:  You feel more like you may vomit.  You feel more tired.  Your headache gets worse.  You feel more confused.  You feel weaker.  Your symptoms go away and then they come back.  You have trouble following the diet instructions. Get help right away if:  You have a seizure.  You pass out.  You keep having watery poop.  You keep vomiting. Summary  Hyponatremia is when the amount of salt in your blood is too low.  When salt levels are low, you can have swelling throughout the body. The swelling mostly affects the brain.  Treatment depends on the cause. Treatment may include getting IV fluids, medicines, or not drinking as much fluid. This information is not intended to replace advice given to you by your health care provider. Make sure you discuss any questions you have with your health care provider. Document Revised: 06/28/2018 Document Reviewed: 03/15/2018 Elsevier Patient Education  2020 Elsevier Inc.  

## 2019-08-15 LAB — BASIC METABOLIC PANEL
BUN/Creatinine Ratio: 16 (ref 12–28)
BUN: 19 mg/dL (ref 8–27)
CO2: 22 mmol/L (ref 20–29)
Calcium: 9.5 mg/dL (ref 8.7–10.3)
Chloride: 102 mmol/L (ref 96–106)
Creatinine, Ser: 1.19 mg/dL — ABNORMAL HIGH (ref 0.57–1.00)
GFR calc Af Amer: 51 mL/min/{1.73_m2} — ABNORMAL LOW (ref >59)
GFR calc non Af Amer: 44 mL/min/{1.73_m2} — ABNORMAL LOW (ref >59)
Glucose: 99 mg/dL (ref 65–99)
Potassium: 4.8 mmol/L (ref 3.5–5.2)
Sodium: 136 mmol/L (ref 134–144)

## 2019-08-15 NOTE — Progress Notes (Signed)
Contacted via MyChart Good morning Jazzmon, your labs have returned and sodium levels is now back to normal range.  No changes needed at this time, continue to integrate some salt into diet to maintain levels.  You continue to show some kidney disease, but it is remaining stable and not declining.  Have a wonderful day!! Keep being awesome!! Kindest regards, Tomoya Ringwald

## 2019-08-21 DIAGNOSIS — Z96641 Presence of right artificial hip joint: Secondary | ICD-10-CM | POA: Diagnosis not present

## 2019-08-21 DIAGNOSIS — Z471 Aftercare following joint replacement surgery: Secondary | ICD-10-CM | POA: Diagnosis not present

## 2019-08-23 DIAGNOSIS — H903 Sensorineural hearing loss, bilateral: Secondary | ICD-10-CM | POA: Diagnosis not present

## 2019-08-23 DIAGNOSIS — R42 Dizziness and giddiness: Secondary | ICD-10-CM | POA: Diagnosis not present

## 2019-08-28 DIAGNOSIS — H9319 Tinnitus, unspecified ear: Secondary | ICD-10-CM | POA: Diagnosis not present

## 2019-08-28 DIAGNOSIS — R42 Dizziness and giddiness: Secondary | ICD-10-CM | POA: Diagnosis not present

## 2019-08-28 DIAGNOSIS — H903 Sensorineural hearing loss, bilateral: Secondary | ICD-10-CM | POA: Diagnosis not present

## 2019-08-29 ENCOUNTER — Other Ambulatory Visit: Payer: Self-pay | Admitting: Family Medicine

## 2019-08-29 ENCOUNTER — Other Ambulatory Visit: Payer: Self-pay | Admitting: Nurse Practitioner

## 2019-08-29 ENCOUNTER — Other Ambulatory Visit: Payer: Self-pay

## 2019-08-29 ENCOUNTER — Telehealth: Payer: Self-pay | Admitting: Nurse Practitioner

## 2019-08-29 DIAGNOSIS — E039 Hypothyroidism, unspecified: Secondary | ICD-10-CM

## 2019-08-29 DIAGNOSIS — I10 Essential (primary) hypertension: Secondary | ICD-10-CM

## 2019-08-29 MED ORDER — BENAZEPRIL HCL 20 MG PO TABS: 20.0000 mg | ORAL_TABLET | Freq: Every day | ORAL | 0 refills | Status: DC

## 2019-08-29 MED ORDER — ATORVASTATIN CALCIUM 40 MG PO TABS: 40.0000 mg | ORAL_TABLET | Freq: Every day | ORAL | 0 refills | Status: DC

## 2019-08-29 MED ORDER — LEVOTHYROXINE SODIUM 100 MCG PO TABS: 100.0000 ug | ORAL_TABLET | Freq: Every day | ORAL | 0 refills | Status: DC

## 2019-08-29 NOTE — Telephone Encounter (Signed)
Sent in Levothyroxine.  Placed future lab order and please see if she can come in for outpatient lab to check this only.

## 2019-08-29 NOTE — Telephone Encounter (Signed)
Copied from [place] [id number]. Topic: General - Other >> Aug 29, 2019  3:06 PM Rainey Pines A wrote: Patient would like a callback from nurse to confirm that medication request that was [facility] has ben received today. Plase advise

## 2019-08-29 NOTE — Telephone Encounter (Signed)
Benazepril sent. I don't see a recent TSH, so levothyroxine was not. Sent to PCP.

## 2019-08-29 NOTE — Telephone Encounter (Signed)
Please refill benazepril and levothyroxine

## 2019-08-29 NOTE — Telephone Encounter (Signed)
*  STAT* If patient is at the pharmacy, call can be transferred to refill team.   1. Which medications need to be refilled? (please list name of each medication and dose if known) atorvastatin  2. Which pharmacy/location (including street and city if local pharmacy) is medication to be sent to? [facility]  3. Do they need a 30 day or 90 day supply? [place]

## 2019-08-30 ENCOUNTER — Other Ambulatory Visit: Payer: Self-pay

## 2019-08-30 ENCOUNTER — Other Ambulatory Visit: Payer: Self-pay | Admitting: Nurse Practitioner

## 2019-08-30 ENCOUNTER — Other Ambulatory Visit: Payer: Medicare Other

## 2019-08-30 ENCOUNTER — Ambulatory Visit (INDEPENDENT_AMBULATORY_CARE_PROVIDER_SITE_OTHER): Payer: Medicare Other | Admitting: Vascular Surgery

## 2019-08-30 VITALS — BP 154/77 | HR 67 | Ht 65.0 in | Wt 174.0 lb

## 2019-08-30 DIAGNOSIS — I83019 Varicose veins of right lower extremity with ulcer of unspecified site: Secondary | ICD-10-CM

## 2019-08-30 DIAGNOSIS — E039 Hypothyroidism, unspecified: Secondary | ICD-10-CM

## 2019-08-30 DIAGNOSIS — L97919 Non-pressure chronic ulcer of unspecified part of right lower leg with unspecified severity: Secondary | ICD-10-CM | POA: Diagnosis not present

## 2019-08-30 MED ORDER — LEVOTHYROXINE SODIUM 100 MCG PO TABS: 100.0000 ug | ORAL_TABLET | Freq: Every day | ORAL | 0 refills | Status: DC

## 2019-08-30 NOTE — Progress Notes (Signed)
Bailey Ayala is a 79 y.o. female who presents with symptomatic venous reflux  Past Medical History:  Diagnosis Date  . AAA (abdominal aortic aneurysm) ([place])    The largest aortic measurement is 4.0 cm on 05/31/19  . Allergy 1980  . Anemia 09-2017  . Arthritis   . Bleeding from varicose vein   . Blood dyscrasia    BLEEDS EASILY   . Blood transfusion without reported diagnosis 2011 or 12  . Cataract ?  . CHF (congestive heart failure) ([place]) ?  . CKD (chronic kidney disease), stage III    Moderate  . Clotting disorder ([place]) ?  Marland Kitchen COPD (chronic obstructive pulmonary disease) ([place])   . Coronary artery disease   . Diabetes mellitus without complication ([place])    patient denies  . Diffuse cystic mastopathy   . Emphysema of lung ([place]) ?  Marland Kitchen Emphysema of lung ([place])   . Heartburn   . Hyperlipidemia   . Hypertension   . Hypothyroidism   . Incomplete left bundle branch block (LBBB) 06/22/2019   Noted on EKG  . L4-L5 disc bulge   . Multiple myeloma ([place])   . Myocardial infarction ([place]) 2019  . Osteoporosis, post-menopausal   . PAD (peripheral artery disease) ([place])   . Renal insufficiency   . Shortness of breath dyspnea    WITH EXERTION   . Stroke ([place])    TIA     2016     WAS FOUND ON SCAN ORDERED FROM [facility]UR[facility]  . Thyroid disease   . TIA (transient ischemic attack) 2016   left hand weakness  . Ulnar nerve compression, left   . Vertigo 06/22/2019   doing much today 06/28/2019    Past Surgical History:  Procedure Laterality Date  . ABDOMINAL HYSTERECTOMY  1990  . BACK SURGERY  2011  . BREAST EXCISIONAL BIOPSY Left    2010?   Marland Kitchen BREAST SURGERY  1975   A lump removed left breast  . CATARACT EXTRACTION, BILATERAL    . COLONOSCOPY  1998  . EYE SURGERY Bilateral 05/06/14  . JOINT REPLACEMENT     LT KNEE  . KNEE SURGERY Left 2012  . LEFT HEART CATH AND CORONARY ANGIOGRAPHY Left 11/19/2018   Procedure: LEFT HEART CATH AND CORONARY ANGIOGRAPHY;  Surgeon: Wellington Hampshire, MD;   Location: [facility];  Service: Cardiovascular;  Laterality: Left;  . OOPHORECTOMY  1990  . parathyroid transplant     2/3 THYROID REMOVED   ([place])  . SPINE SURGERY    . THYROID SURGERY    . TONSILLECTOMY    . TOTAL HIP ARTHROPLASTY Right 07/02/2019   Procedure: TOTAL HIP ARTHROPLASTY ANTERIOR APPROACH;  Surgeon: Paralee Cancel, MD;  Location: [facility];  Service: Orthopedics;  Laterality: Right;  70 mins  . TOTAL SHOULDER ARTHROPLASTY Left 01/14/2016   Procedure: LEFT TOTAL SHOULDER ARTHROPLASTY;  Surgeon: Justice Britain, MD;  Location: [facility];  Service: Orthopedics;  Laterality: Left;  . TOTAL SHOULDER ARTHROPLASTY Right 11/03/2016   Procedure: RIGHT TOTAL SHOULDER ARTHROPLASTY;  Surgeon: Justice Britain, MD;  Location: [facility];  Service: Orthopedics;  Laterality: Right;  . ULNAR NERVE REPAIR     LEFT  . vein closure procedure Left 2010     Current Outpatient Medications:  .  atorvastatin (LIPITOR) 40 MG tablet, Take 1 tablet (40 mg total) by mouth daily., Disp: 90 tablet, Rfl: 0 .  benazepril (LOTENSIN) 20 MG tablet, Take 1 tablet (20 mg total) by mouth daily., Disp:  90 tablet, Rfl: 0 .  levothyroxine (SYNTHROID) 100 MCG tablet, Take 1 tablet (100 mcg total) by mouth daily., Disp: 90 tablet, Rfl: 0 .  meclizine (ANTIVERT) 25 MG tablet, Take 1 tablet (25 mg total) by mouth 2 (two) times daily as needed for dizziness., Disp: 15 tablet, Rfl: 0 .  metoprolol tartrate (LOPRESSOR) 50 MG tablet, Take 1 tablet (50 mg total) by mouth 2 (two) times daily., Disp: 60 tablet, Rfl: 11 .  Multiple Vitamin (MULTIVITAMIN WITH MINERALS) TABS tablet, Take 1 tablet by mouth daily., Disp: , Rfl:  .  triamcinolone cream (KENALOG) 0.1 %, Apply 1 application topically 2 (two) times daily. (Patient taking differently: Apply 1 application topically 2 (two) times daily as needed (skin irritation/rash.). ), Disp: 30 g, Rfl: 0 .  ferrous sulfate (FERROUSUL) 325 (65 FE) MG tablet, Take 1 tablet (325 mg total) by mouth  3 (three) times daily with meals for 14 days., Disp: 42 tablet, Rfl: 0 .  methocarbamol (ROBAXIN) 500 MG tablet, Take 1 tablet (500 mg total) by mouth every 6 (six) hours as needed for muscle spasms. (Patient not taking: Reported on 08/14/2019), Disp: 40 tablet, Rfl: 0 .  ondansetron (ZOFRAN) 4 MG tablet, Take 1 tablet (4 mg total) by mouth daily as needed. (Patient not taking: Reported on 08/06/2019), Disp: 14 tablet, Rfl: 0 .  polyethylene glycol (MIRALAX / GLYCOLAX) 17 g packet, Take 17 g by mouth 2 (two) times daily. (Patient not taking: Reported on 08/06/2019), Disp: 28 packet, Rfl: 0 .  rivaroxaban (XARELTO) 10 MG TABS tablet, Take 1 tablet (10 mg total) by mouth daily. (Patient not taking: Reported on 08/06/2019), Disp: 14 tablet, Rfl: 0  Allergies  Allergen Reactions  . Oxycodone Other (See Comments)    Made her feel "shaky"  . Codeine Other (See Comments)    hallucinations    . Simvastatin Diarrhea and Nausea Only     Ulcerated varicose veins of leg, right (HCC)     PLAN: The patient's right lower extremity was sterilely prepped and draped. The ultrasound machine was used to visualize the saphenous vein throughout its course. A segment in the mid to upper calf was selected for access. The saphenous vein was accessed without difficulty using ultrasound guidance with a micropuncture needle. A 0.018 wire was then placed beyond the saphenofemoral junction and the needle was removed. The 65 cm sheath was then placed over the wire and the wire and dilator were removed. The laser fiber was then placed through the sheath and its tip was placed approximately 4-5 centimeters below the saphenofemoral junction. Tumescent anesthesia was then created with a dilute lidocaine solution. Laser energy was then delivered with constant withdrawal of the sheath and laser fiber. Approximately 1519 joules of energy were delivered over a length of 40 centimeters using a 1470 Hz VenaCure machine at 7 W. Sterile  dressings were placed. The patient tolerated the procedure well without obvious complications.   Follow-up in 1 week with post-laser duplex.

## 2019-08-30 NOTE — Telephone Encounter (Signed)
Called and left a detailed message for patient, letting her know that she needs a lab visit only, asked her to return my call.

## 2019-08-30 NOTE — Telephone Encounter (Signed)
Medication Refill - Medication: levothyroxine (SYNTHROID) 100 MCG tablet    Preferred Pharmacy (with phone number or street name):  [place], [place] [employer] Phone:  [phone]  Fax:  [phone]       Agent: Please be advised that RX refills may take up to 3 business days. We ask that you follow-up with your pharmacy.

## 2019-08-31 LAB — THYROID PANEL WITH TSH
Free Thyroxine Index: 2 (ref 1.2–4.9)
T3 Uptake Ratio: 26 % (ref 24–39)
T4, Total: 7.5 ug/dL (ref 4.5–12.0)
TSH: 0.693 u[IU]/mL (ref 0.450–4.500)

## 2019-08-31 NOTE — Progress Notes (Signed)
Contacted via MyChart

## 2019-09-02 ENCOUNTER — Other Ambulatory Visit: Payer: Self-pay

## 2019-09-02 ENCOUNTER — Other Ambulatory Visit (INDEPENDENT_AMBULATORY_CARE_PROVIDER_SITE_OTHER): Payer: Self-pay | Admitting: Vascular Surgery

## 2019-09-02 ENCOUNTER — Ambulatory Visit (INDEPENDENT_AMBULATORY_CARE_PROVIDER_SITE_OTHER): Payer: Medicare Other

## 2019-09-02 DIAGNOSIS — I83899 Varicose veins of unspecified lower extremities with other complications: Secondary | ICD-10-CM

## 2019-09-02 NOTE — Telephone Encounter (Signed)
Patient has came in and had blood work done.

## 2019-09-04 NOTE — Progress Notes (Signed)
[facility]  [address], [address] [place], [place] [address] Phone: [phone]  Fax: [phone]   Telemedicine Office Visit:  09/06/2019  Referring physician: Guadalupe Maple, MD  I connected with Bailey Ayala on 09/06/19 at 2:35 PM by telephone and verified that I was speaking with the correct person using 2 identifiers. The patient was at home. I discussed the limitations, risk, security and privacy concerns of performing an evaluation and management service by telephone and the availability of in person appointments.  I also discussed with the patient that there may be a patient responsible charge related to this service. The patient expressed understanding and agreed to proceed.    Chief Complaint: Bailey Ayala is a 79 y.o. female with multiplemyelomawho is seen for 1 month assessment and further discussion regarding direction of therapy.   HPI: The patient was last seen in the medical oncology clinic on 08/07/2019. At that time, she felt good. She was skeptical about initiating treatment for her myeloma. She requested some time to consider whether she wished to pursue treatment. Hematocrit was 29.0, hemoglobin 9.5, MCV 102.8, platelets 226,000, WBC 4900, ANC 2900. Sodium was 129. Creatinine was 1.27. Calcium was 9.0.  M-spike was 1.6 gm/dL. Lambda free light chains were 341.6 (ratio 0.05).   She was seen by Dr. Lucky Cowboy on 08/30/2019 for symptomatic venous reflux.  She underwent lower extremity venous laser ablation. She tolerated the procedure well with no complications. She has a follow-up in 1 week.  Sympttomatically, she is doing well today. She notes she has bruises all up her leg from the laser ablation she had last week. She will see Dr. Lucky Cowboy again on 10/01/2019. She says she feels great and that she hasn't felt this good in a year and a half. She says it is because of the hip replacement surgery. She no longer uses her walker and only uses her  cane if she goes out in public. She is slowly getting back to performing her daily activities. She feels like she improves everyday. Her shortness of breath and cough are not good. Her bowels have changed and are not as "runny". She no longer has nausea. She denies any infections. She has decided she does not want treatment however she would still like to be monitored here at the clinic every 6 weeks.   She had her COVID-19 vaccinations in 04/2019.   Past Medical History:  Diagnosis Date  . AAA (abdominal aortic aneurysm) ([place])    The largest aortic measurement is 4.0 cm on 05/31/19  . Allergy 1980  . Anemia 09-2017  . Arthritis   . Bleeding from varicose vein   . Blood dyscrasia    BLEEDS EASILY   . Blood transfusion without reported diagnosis 2011 or 12  . Cataract ?  . CHF (congestive heart failure) ([place]) ?  . CKD (chronic kidney disease), stage III    Moderate  . Clotting disorder ([place]) ?  Marland Kitchen COPD (chronic obstructive pulmonary disease) ([place])   . Coronary artery disease   . Diabetes mellitus without complication ([place])    patient denies  . Diffuse cystic mastopathy   . Emphysema of lung ([place]) ?  Marland Kitchen Emphysema of lung ([place])   . Heartburn   . Hyperlipidemia   . Hypertension   . Hypothyroidism   . Incomplete left bundle branch block (LBBB) 06/22/2019   Noted on EKG  . L4-L5 disc bulge   . Multiple myeloma ([place])   . Myocardial  infarction ([place]) 2019  . Osteoporosis, post-menopausal   . PAD (peripheral artery disease) ([place])   . Renal insufficiency   . Shortness of breath dyspnea    WITH EXERTION   . Stroke ([place])    TIA     2016     WAS FOUND ON SCAN ORDERED FROM NEUROL  . Thyroid disease   . TIA (transient ischemic attack) 2016   left hand weakness  . Ulnar nerve compression, left   . Vertigo 06/22/2019   doing much today 06/28/2019    Past Surgical History:  Procedure Laterality Date  . ABDOMINAL HYSTERECTOMY  1990  . BACK SURGERY  2011  . BREAST EXCISIONAL BIOPSY Left     2010?   Marland Kitchen BREAST SURGERY  1975   A lump removed left breast  . CATARACT EXTRACTION, BILATERAL    . COLONOSCOPY  1998  . EYE SURGERY Bilateral 05/06/14  . JOINT REPLACEMENT     LT KNEE  . KNEE SURGERY Left 2012  . LEFT HEART CATH AND CORONARY ANGIOGRAPHY Left 11/19/2018   Procedure: LEFT HEART CATH AND CORONARY ANGIOGRAPHY;  Surgeon: Wellington Hampshire, MD;  Location: [facility];  Service: Cardiovascular;  Laterality: Left;  . OOPHORECTOMY  1990  . parathyroid transplant     2/3 THYROID REMOVED   (GOITER)  . SPINE SURGERY    . THYROID SURGERY    . TONSILLECTOMY    . TOTAL HIP ARTHROPLASTY Right 07/02/2019   Procedure: TOTAL HIP ARTHROPLASTY ANTERIOR APPROACH;  Surgeon: Paralee Cancel, MD;  Location: [facility];  Service: Orthopedics;  Laterality: Right;  70 mins  . TOTAL SHOULDER ARTHROPLASTY Left 01/14/2016   Procedure: LEFT TOTAL SHOULDER ARTHROPLASTY;  Surgeon: Justice Britain, MD;  Location: [facility];  Service: Orthopedics;  Laterality: Left;  . TOTAL SHOULDER ARTHROPLASTY Right 11/03/2016   Procedure: RIGHT TOTAL SHOULDER ARTHROPLASTY;  Surgeon: Justice Britain, MD;  Location: [facility];  Service: Orthopedics;  Laterality: Right;  . ULNAR NERVE REPAIR     LEFT  . vein closure procedure Left 2010    Family History  Problem Relation Age of Onset  . Heart disease Mother        MI  . Arthritis Mother   . Stroke Mother   . Hyperlipidemia Mother   . Hypertension Mother   . Varicose Veins Mother   . Heart disease Father        MI  . Hyperlipidemia Father   . Arthritis Father   . Cancer Father   . COPD Father   . Hearing loss Father   . Hypertension Father   . Vision loss Father   . Diabetes Sister   . COPD Sister   . Heart disease Sister   . Hyperlipidemia Sister   . Hypertension Sister   . Hyperlipidemia Brother   . COPD Brother   . Diabetes Brother   . Heart disease Brother   . Hypertension Brother   . Arthritis Sister   . Hyperlipidemia Sister   . Hypertension Sister     . Vision loss Sister   . Varicose Veins Sister   . Arthritis Brother   . Varicose Veins Brother   . Arthritis Daughter   . Arthritis Daughter   . Diabetes Daughter   . Cancer Paternal Aunt   . COPD Sister   . Heart disease Sister   . Hyperlipidemia Sister   . Hypertension Sister   . Varicose Veins Sister   . Heart disease Maternal Uncle   .  Heart disease Paternal Uncle     Social History:  reports that she quit smoking about 29 years ago. Her smoking use included cigarettes. She has a 75.00 pack-year smoking history. She has never used smokeless tobacco. She reports previous alcohol use. She reports that she does not use drugs. Patient is a former 3 pack per day smoker for 25 years (75 pack year). Patient lives in [place]. She is a former Electrical engineer. Patient denies known exposures to radiation on toxins. The patient is alone  today.  Participants in the patient's visit and their role in the encounter included the patient, Heywood Footman, and [employer], CMA, today.  The intake visit was provided by Vito Berger, CMA.    Allergies:  Allergies  Allergen Reactions  . Oxycodone Other (See Comments)    Made her feel "shaky"  . Codeine Other (See Comments)    hallucinations    . Simvastatin Diarrhea and Nausea Only    Current Medications: Current Outpatient Medications  Medication Sig Dispense Refill  . atorvastatin (LIPITOR) 40 MG tablet Take 1 tablet (40 mg total) by mouth daily. 90 tablet 0  . benazepril (LOTENSIN) 20 MG tablet Take 1 tablet (20 mg total) by mouth daily. 90 tablet 0  . ferrous sulfate (FERROUSUL) 325 (65 FE) MG tablet Take 1 tablet (325 mg total) by mouth 3 (three) times daily with meals for 14 days. 42 tablet 0  . levothyroxine (SYNTHROID) 100 MCG tablet Take 1 tablet (100 mcg total) by mouth daily. 90 tablet 0  . meclizine (ANTIVERT) 25 MG tablet Take 1 tablet (25 mg total) by mouth 2 (two) times daily as needed for dizziness. 15 tablet 0  .  metoprolol tartrate (LOPRESSOR) 50 MG tablet Take 1 tablet (50 mg total) by mouth 2 (two) times daily. 60 tablet 11  . Multiple Vitamin (MULTIVITAMIN WITH MINERALS) TABS tablet Take 1 tablet by mouth daily.    Marland Kitchen triamcinolone cream (KENALOG) 0.1 % Apply 1 application topically 2 (two) times daily. (Patient taking differently: Apply 1 application topically 2 (two) times daily as needed (skin irritation/rash.). ) 30 g 0  . methocarbamol (ROBAXIN) 500 MG tablet Take 1 tablet (500 mg total) by mouth every 6 (six) hours as needed for muscle spasms. (Patient not taking: Reported on 08/14/2019) 40 tablet 0  . ondansetron (ZOFRAN) 4 MG tablet Take 1 tablet (4 mg total) by mouth daily as needed. (Patient not taking: Reported on 08/06/2019) 14 tablet 0  . polyethylene glycol (MIRALAX / GLYCOLAX) 17 g packet Take 17 g by mouth 2 (two) times daily. (Patient not taking: Reported on 08/06/2019) 28 packet 0  . rivaroxaban (XARELTO) 10 MG TABS tablet Take 1 tablet (10 mg total) by mouth daily. (Patient not taking: Reported on 08/06/2019) 14 tablet 0   No current facility-administered medications for this visit.    Review of Systems  Constitutional: Negative for chills, diaphoresis, fever, malaise/fatigue and weight loss.       Feels "great".  HENT: Negative for congestion, hearing loss, sore throat and tinnitus.   Eyes: Negative for blurred vision and double vision.  Respiratory: Positive for cough (chronic; stable) and shortness of breath.        COPD  Cardiovascular: Negative for chest pain, palpitations and leg swelling.  Gastrointestinal: Positive for diarrhea (chronic). Negative for abdominal pain, blood in stool, constipation, heartburn, melena, nausea and vomiting.  Genitourinary: Negative for dysuria, frequency and urgency.  Musculoskeletal: Positive for joint pain (right hip; better s/p right  hip replacement) and myalgias (right hip muscles). Negative for back pain, falls and neck pain.  Skin: Negative  for itching and rash.  Neurological: Negative for dizziness, weakness and headaches.       Aneurysm, near syncope episode on 06/22/2019. Vertigo comes and goes  Endo/Heme/Allergies: Negative for environmental allergies. Does not bruise/bleed easily.       Hypothyroidism - on levothyroxine.   Psychiatric/Behavioral: Negative for depression. The patient is nervous/anxious.   All other systems reviewed and are negative.  Performance status (ECOG): 2 - Symptomatic, <50% confined to bed  Vitals There were no vitals taken for this visit.    No visits with results within 3 Day(s) from this visit.  Latest known visit with results is:  Lab on 08/30/2019  Component Date Value Ref Range Status  . TSH 08/30/2019 0.693  0.450 - 4.500 uIU/mL Final  . T4, Total 08/30/2019 7.5  4.5 - 12.0 ug/dL Final  . T3 Uptake Ratio 08/30/2019 26  24 - 39 % Final  . Free Thyroxine Index 08/30/2019 2.0  1.2 - 4.9 Final    Assessment:  Bailey Ayala is a 79 y.o. female with smoldering multiple myeloma. SPEPon 06/21/2017 revealed a 1.2 gm/dL monoclonal spike. Random urine revealed 4.6 mg/dL protein and a 40.6% M-spike. Calcium, albumin, and serum protein were normal.  Work-up on 03/08/2019revealed a hematocrit of 31.1, hemoglobin 10.4, MCV 97.4, platelets 227,000, WBC 5700 with an ANC of 3300. SPEPrevealed a 1.1 gm/dL IgG monoclonal protein with lambda light chain specificity and monoclonal free lambda light chains(Bence-Jones protein). IgG was 1660 ([phone]) and IgA was 48 (64-422). Kappa free light chains were 23.7, lambda free light chains were 142.2 and ratio 0.17 (0.26-1.65). Beta2-microglobulin was 4.7 (0.6-2.4). Normal studies included: B12, folate, and ferritin (75). TSHwas 0.204 (low). 24 hour UPEPrevealed kappa free light chains 28.80 mg/L, lambda free light chains 63.80 mg/L and ration 0.45 (2.04 - 10.37). M spike was 27.4% (16 mg/24 hours).  Bone surveyon 06/30/2017 revealed no  suspicious focal bony lesions.  Bone marrow aspirate and biopsyon 07/26/2017 revealed a slightly hypercellular marrow for age with trilineage hematopoiesis. There was 13% plasmacytosis(10-15% byCD138IHC). Plasma cells were ininterstitial cells and small clusters. Lambda light chains appeared restricted. Features were felt compatible with plasma cell neoplasm.  Bone marrow aspirate and biopsy on 05/29/2019 revealed a normocellularmarrow for age (20-30%) withincreased monoclonal plasma cells (15% aspirate, 20-30% CD138by IHC).Plasma cells were lambda restricted.There were no myelodysplastic changes.Storage iron was present. Flow cytometry revealed no monoclonal B-cell or phenotypically aberrant T-cell population. Findings were consistent with persistent plasma cell myeloma.Peripheral bloodrevealed amacrocytic anemia with rouleaux formation.Cytogenetics were normal (9, XX). FISH for MDSrevealed gain of KMT2A (MLL) or chromosome 11/11q (71F.5.0%, normal <3.1%).FISH for myeloma revealed a deletion of NF1 or chromosome 17q11 (2R1G, 89%, not seen in validation studies).  PET scanon 08/14/2017 revealed no hypermetabolic lesions of myeloma. There was a 3.6 cm infrarenal aortic aneurysm. Ultrasound in 2 years was recommended. She had a 4 mm LLL noduletoo small to characterize.  SPEPhas been followed: 1.1 on 06/30/2017, 1.2 on 11/14/2017, 1.3 on 01/26/2018, 1.2 on 05/10/2018, 1.4 on 08/08/2018, 1.2 on 11/09/2018, 1.4 on 02/11/2019, and 1.6 on 05/09/2019. IgGhas been followed: 1660 on 06/30/2017, 1684 on 11/14/2017, 1795 on 01/26/2018, and 2054 on 08/08/2018.  Lambda free light chainshave been followed:142.2 (ratio 0.17)on03/11/2017, 214.4(ratio 0.09)on01/16/2020, 209.3(ratio 0.08)on04/14/2020, 220.2(ratio 0.07)on07/17/2020, 228.9(ratio 0.08)on 02/11/2019,and 258.4 (ratio 0.07)on01/14/2021.  Chest CTon 01/24/2018 revealed persistent LLL nodule. Area  calcified and felt to be consistent with  a granuloma. There were no other suspicious pulmonary nodules. There was new Schmorl's node formation involving the inferior endplate of Y65. There were stable small scattered osseous lesions related to underlying multiple myeloma.  PET scanon 05/15/2018 revealed nohypermetabolic lesions characteristic of active myeloma. There was stable hypermetabolic apical wall of the left ventricle, there waspotentially a small pseudoaneurysmin this vicinity, but no appreciable mass was noted on 01/24/2018. There was aninfrarenal abdominal aortic aneurysm, 3.6 cm in diameter. Follow-up ultrasound in 2 years was recommended. Other imaging findings of potential clinical significance: aortic atherosclerosis and coronary atherosclerosis.  Bone survery on01/25/2021 showed numerous new lytic lesions throughout the skeleton including the posterior calvarium, bilateral humeri, bilateral iliac bones, left inferior pubic ramus, bilateral femurs.  She has stage III chronic kidney disease(Cr 2.25 on 05/27/2019).   She has a macrocytic anemia. B12, folate, and TSH were normal on 05/10/2018. Retic was 1.6%. Ferritin and iron studies were normal on 05/10/2018. Appetiteremains poor. Last colonoscopywas >10 years ago (she declines repeat).  She has a 75 pack year smoking history. She has COPD. Chest CT without contraston 07/25/2017 revealed no suspicious pulmonary nodules.  Sheunderwent total right hip replacementon 07/02/2019. She required 2 units of PRBCs.   She had her COVID-19 vaccinations in 04/2019.  Symptomatically, she is feeling great since her hip surgery.  She does not wish to pursue therapy for her myeloma.  She wishes to continue surveillance.  Plan: 1.   Review labs from 08/07/2019 and 08/14/2019. 2. Multiple myeloma Symptomatically,she feels good.. M spikewas 1.6 gm/dL on 05/09/2019. Lambda free light [id number](ratio  0.07). Bone surveyon 05/20/2019 revealednumerous new lytic lesions throughout the skeleton. Bone marrow aspirateon 05/29/2019 revealed anormocellularmarrow for age (20-30%) withincreased monoclonal plasma cells (20-30% CD138IHC). Cytogenetics were normal (20, XX).  FISH for myeloma revealed a deletion of NF1 or chromosome 17q11 (2R1G, 89%, not seen in validation studies). Marrow plasmacytosis has increased from 10-15% to 20-30%since 07/2017. Review CRAB criteria. Patient hasrenal dysfunction, anemia (Hgb 9.5),and lytic bone lesions.  Calcium is 9.0 (normal). Proposed treatmentplan: Patient is not a transplant candidate given her age and comorbidities. Patient in agreement  Review plan forVRd (Velcade 1.3 mg/m2 SQ day 1,4,8,11, Revlimid po day 1-14, and Decadron 20 mg po on day 1,2,4,5,8,9,11 and 12) every 21 days for 8 cycles.  ConsiderVRd with weekly Velcade (day 1,8,15)andDecadron based on tolerance. Patient will then receivemaintenance Rd until progression or toxicity. Potential side effects reviewed. She will requireherpes zoster prophylaxisandantithrombotic therapy (aspirin). Patient wishes to postpone/defer treatment at this time. She wishes to pursue surveillance every 6 weeks. 3. Back and right hip pain PET scan on 01/21/2020revealed no active myeloma. Bone survey revealed multiple lytic lesions. Right hip pain resolving after surgery. 4. Renal insufficieny Creatinine 1.19 on 08/14/2019. Consider use of Retacrit for anemia of chronic renal disease. 5. Macrocytic anemia Hematocrit 29.0. Hemoglobin 9.5. MCV 102.8 on 08/07/2019. B12was 911 and folate 60.9. on 05/16/2019. Shehas no liver  disease. Bone marrow on04/03/2019and 02/03/2021revealed no evidence of MDS.  Continue to monitor. 6.Vascular issues in feet Patient notes significant blood loss secondary to fragile blood vessels in feet. She underwent laser ablation on 08/30/2019. 7. RTC in 6 weeks for MD assessment and labs (CBC with diff, CMP, SPEP, FLCA).  I discussed the assessment and treatment plan with the patient.  The patient was provided an opportunity to ask questions and all were answered.  The patient agreed with the plan and demonstrated an understanding of the instructions.  The patient was advised to call  back if the symptoms worsen or if the condition fails to improve as anticipated.  I provided 18 minutes (2:35 PM - 2:53 PM) of non-face-to-face time during this encounter.  I provided these services from the [facility] office.   Lequita Asal, MD, PhD    09/06/2019, 2:35 PM  I, Heywood Footman, am acting as Education administrator for [employer]. Mike Gip, MD, PhD.  I, Weslie Pretlow C. Mike Gip, MD, have reviewed the above documentation for accuracy and completeness, and I agree with the above.

## 2019-09-06 ENCOUNTER — Inpatient Hospital Stay: Payer: Medicare Other | Attending: Hematology and Oncology | Admitting: Hematology and Oncology

## 2019-09-06 ENCOUNTER — Other Ambulatory Visit: Payer: Self-pay

## 2019-09-06 ENCOUNTER — Encounter: Payer: Self-pay | Admitting: Hematology and Oncology

## 2019-09-06 DIAGNOSIS — C9000 Multiple myeloma not having achieved remission: Secondary | ICD-10-CM | POA: Diagnosis not present

## 2019-09-06 DIAGNOSIS — Z7189 Other specified counseling: Secondary | ICD-10-CM

## 2019-09-12 ENCOUNTER — Other Ambulatory Visit: Payer: Self-pay

## 2019-09-12 ENCOUNTER — Ambulatory Visit (INDEPENDENT_AMBULATORY_CARE_PROVIDER_SITE_OTHER): Payer: Medicare Other | Admitting: Podiatry

## 2019-09-12 ENCOUNTER — Encounter: Payer: Self-pay | Admitting: Podiatry

## 2019-09-12 VITALS — Temp 97.6°F

## 2019-09-12 DIAGNOSIS — M8949 Other hypertrophic osteoarthropathy, multiple sites: Secondary | ICD-10-CM

## 2019-09-12 DIAGNOSIS — N1832 Chronic kidney disease, stage 3b: Secondary | ICD-10-CM | POA: Diagnosis not present

## 2019-09-12 DIAGNOSIS — L84 Corns and callosities: Secondary | ICD-10-CM

## 2019-09-12 DIAGNOSIS — M79674 Pain in right toe(s): Secondary | ICD-10-CM | POA: Diagnosis not present

## 2019-09-12 DIAGNOSIS — B351 Tinea unguium: Secondary | ICD-10-CM | POA: Diagnosis not present

## 2019-09-12 DIAGNOSIS — M79675 Pain in left toe(s): Secondary | ICD-10-CM | POA: Diagnosis not present

## 2019-09-12 DIAGNOSIS — M159 Polyosteoarthritis, unspecified: Secondary | ICD-10-CM

## 2019-09-12 NOTE — Progress Notes (Signed)
This patient returns to my office for at risk foot care.  This patient requires this care by a professional since this patient will be at risk due to having PAD and chronic kidney disease.  This patient is unable to cut nails herself since the patient cannot reach her nails.These nails are painful walking and wearing shoes.  This patient presents for at risk foot care today.  General Appearance  Alert, conversant and in no acute stress.  Vascular  Dorsalis pedis and posterior tibial  pulses are palpable  bilaterally.  Capillary return is within normal limits  bilaterally. Temperature is within normal limits  bilaterally.  Neurologic  Senn-Weinstein monofilament wire test diminished   bilaterally. Muscle power within normal limits bilaterally.  Nails Thick disfigured discolored nails with subungual debris  from hallux to fifth toes bilaterally. No evidence of bacterial infection or drainage bilaterally.  Orthopedic  No limitations of motion  feet .  No crepitus or effusions noted.  No bony pathology or digital deformities noted.  HAV with overlapping second digits  B/L.  PTTD and DJD right rearfoot.  Skin  normotropic skin with no porokeratosis noted bilaterally.  No signs of infections or ulcers noted.     Onychomycosis  Pain in right toes  Pain in left toes  Consent was obtained for treatment procedures.   Mechanical debridement of nails 1-5  bilaterally performed with a nail nipper.  Filed with dremel without incident.    Return office visit   3 months                   Told patient to return for periodic foot care and evaluation due to potential at risk complications.   Gardiner Barefoot DPM

## 2019-09-13 ENCOUNTER — Other Ambulatory Visit: Payer: Self-pay | Admitting: Nurse Practitioner

## 2019-09-13 ENCOUNTER — Other Ambulatory Visit: Payer: Self-pay | Admitting: Family Medicine

## 2019-09-13 DIAGNOSIS — E039 Hypothyroidism, unspecified: Secondary | ICD-10-CM

## 2019-09-13 DIAGNOSIS — I10 Essential (primary) hypertension: Secondary | ICD-10-CM

## 2019-09-24 ENCOUNTER — Encounter: Payer: Self-pay | Admitting: Nurse Practitioner

## 2019-09-24 ENCOUNTER — Ambulatory Visit
Admission: RE | Admit: 2019-09-24 | Discharge: 2019-09-24 | Disposition: A | Discharge status: Home / Self Care | Payer: Medicare Other | Source: Ambulatory Visit | Attending: Nurse Practitioner | Admitting: Nurse Practitioner

## 2019-09-24 ENCOUNTER — Other Ambulatory Visit: Payer: Self-pay

## 2019-09-24 ENCOUNTER — Ambulatory Visit: Payer: Self-pay

## 2019-09-24 ENCOUNTER — Ambulatory Visit (INDEPENDENT_AMBULATORY_CARE_PROVIDER_SITE_OTHER): Payer: Medicare Other | Admitting: Nurse Practitioner

## 2019-09-24 VITALS — BP 153/68 | HR 60 | Temp 98.0°F | Ht 65.0 in | Wt 174.0 lb

## 2019-09-24 DIAGNOSIS — R0782 Intercostal pain: Secondary | ICD-10-CM | POA: Insufficient documentation

## 2019-09-24 DIAGNOSIS — R079 Chest pain, unspecified: Secondary | ICD-10-CM | POA: Insufficient documentation

## 2019-09-24 DIAGNOSIS — I700 Atherosclerosis of aorta: Secondary | ICD-10-CM | POA: Diagnosis not present

## 2019-09-24 MED ORDER — DICLOFENAC SODIUM 1 % EX GEL: 2.0000 g | Freq: Four times a day (QID) | CUTANEOUS | 0 refills | Status: AC

## 2019-09-24 NOTE — Telephone Encounter (Signed)
Pt. Reports she was mulching in her yard Friday and feels like she injured her sternum. Has pain in sternum with movement. Pain is 5/10. No radiation, no nausea or dizziness. Has shortness  Of breath, but reports she has COPD, and it is her usual. Reports she has multiple myeloma, and "I'm afraid I have broken a rib." Spoke with Christan in the practice and appointment made for today.   Reason for Disposition . [1] High-risk adult (e.g., age > 57, osteoporosis, chronic steroid use) AND [2] still hurts  Answer Assessment - Initial Assessment Questions 1. MECHANISM: "How did the injury happen?"     Lifting mulch 2. ONSET: "When did the injury happen?" (e.g., minutes, hours, days ago)     Friday 3. LOCATION: "Where on the chest is the injury located?" "Where does it hurt?"     Sternum 4. CHEST OR RIB PAIN SEVERITY: "How bad is the pain?"  (e.g., Scale 1-10; mild, moderate, or severe)    - MILD (1-3): doesn't interfere with normal activities     - MODERATE (4-7): interferes with normal activities or awakens from sleep    - SEVERE (8-10): excruciating pain, unable to do any normal activities       5 5. BREATHING DIFFICULTY: "Are you having any difficulty breathing?" If so, ask "How bad is it?"  (e.g., none, mild, moderate, severe) Mild 6: OTHER SYMPTOMS (e.g., cough, fever, rash)      No 7. PREGNANCY: "Is there any chance you are pregnant?" "When was your last menstrual period?"     No  Protocols used: CHEST INJURY - BENDING, LIFTING, OR TWISTING-A-AH

## 2019-09-24 NOTE — Patient Instructions (Addendum)
Costochondritis Costochondritis is swelling and irritation (inflammation) of the tissue (cartilage) that connects your ribs to your breastbone (sternum). This causes pain in the front of your chest. Usually, the pain:  Starts gradually.  Is in more than one rib. This condition usually goes away on its own over time. Follow these instructions at home:  Do not do anything that makes your pain worse.  If directed, put ice on the painful area: ? Put ice in a plastic bag. ? Place a towel between your skin and the bag. ? Leave the ice on for 20 minutes, 2-3 times a day.  If directed, put heat on the affected area as often as told by your doctor. Use the heat source that your doctor tells you to use, such as a moist heat pack or a heating pad. ? Place a towel between your skin and the heat source. ? Leave the heat on for 20-30 minutes. ? Take off the heat if your skin turns bright red. This is very important if you cannot feel pain, heat, or cold. You may have a greater risk of getting burned.  Take over-the-counter and prescription medicines only as told by your doctor.  Return to your normal activities as told by your doctor. Ask your doctor what activities are safe for you.  Keep all follow-up visits as told by your doctor. This is important. Contact a doctor if:  You have chills or a fever.  Your pain does not go away or it gets worse.  You have a cough that does not go away. Get help right away if:  You are short of breath. This information is not intended to replace advice given to you by your health care provider. Make sure you discuss any questions you have with your health care provider. Document Revised: 04/26/2017 Document Reviewed: 08/05/2015 Elsevier Patient Education  2020 [employer]  

## 2019-09-24 NOTE — Progress Notes (Signed)
BP (!) 153/68 (BP Location: Left Arm, Patient Position: Sitting, Cuff Size: Normal)   Pulse 60   Temp 98 F (36.7 C) (Oral)   Ht _0  (1.651 m)   Wt 174 lb (78.9 kg)   SpO2 98%   BMI 28.96 kg/m    Subjective:    Patient ID: Bailey Ayala, female    DOB: 1940-11-09, 79 y.o.   MRN: [id number]  HPI: Bailey Ayala is a 79 y.o. female presenting with chest pain.  Chief Complaint  Patient presents with  . Chest Pain    Patient states that she was working in the [employer] and has had chest pain since. Patient can't lift anything. Patient concerned for fracture.   CHEST PAIN Patient reports she was moving mulch all last week until Thursday.  Friday, she developed pain in the center of her chest.  She states that if she takes a deep breath, the pain moves all over her chest.  She reports pain when she lifts anything and when she bends down to tie her shoes, the pain is excruciating.  She is worried that she may have fractured a bone in her chest and attributes this fear to her multiple myeloma diagnosis. Time since onset: Duration:days Onset: sudden Quality: burning in sternum, now sharp pain Severity: 8/10 Location: mid sternum, with deep breath across chest Radiation: none Episode duration:  Frequency: constant Related to exertion: yes Activity when pain started:  Trauma: no Anxiety/recent stressors: no Aggravating factors:  Alleviating factors: Tyenol Status: worse Treatments attempted: APAP  Current pain status: in pain - dull ache Shortness of breath: yes Cough: no Nausea: no Diaphoresis: no Heartburn: no Palpitations: no  Allergies  Allergen Reactions  . Oxycodone Other (See Comments)    Made her feel "shaky"  . Codeine Other (See Comments)    hallucinations    . Simvastatin Diarrhea and Nausea Only   Outpatient Encounter Medications as of 09/24/2019  Medication Sig Note  . atorvastatin (LIPITOR) 40 MG tablet Take 1 tablet (40 mg total) by mouth daily.     . benazepril (LOTENSIN) 20 MG tablet Take 1 tablet (20 mg total) by mouth daily.   . ferrous sulfate (FERROUSUL) 325 (65 FE) MG tablet Take 1 tablet (325 mg total) by mouth 3 (three) times daily with meals for 14 days.   Marland Kitchen levothyroxine (SYNTHROID) 100 MCG tablet Take 1 tablet (100 mcg total) by mouth daily.   . meclizine (ANTIVERT) 25 MG tablet Take 1 tablet (25 mg total) by mouth 2 (two) times daily as needed for dizziness. 09/24/2019: PRN  . metoprolol tartrate (LOPRESSOR) 50 MG tablet Take 1 tablet (50 mg total) by mouth 2 (two) times daily.   . Multiple Vitamin (MULTIVITAMIN WITH MINERALS) TABS tablet Take 1 tablet by mouth daily.   Marland Kitchen triamcinolone cream (KENALOG) 0.1 % Apply 1 application topically 2 (two) times daily. (Patient taking differently: Apply 1 application topically 2 (two) times daily as needed (skin irritation/rash.). )   . diclofenac Sodium (VOLTAREN) 1 % GEL Apply 2 g topically 4 (four) times daily.    No facility-administered encounter medications on file as of 09/24/2019.   Patient Active Problem List   Diagnosis Date Noted  . Chest pain 09/24/2019  . Hyponatremia 08/14/2019  . Vertigo 07/22/2019  . Overweight (BMI 25.0-29.9) 07/03/2019  . Acute blood loss anemia 07/03/2019  . S/P right THA, AA 07/02/2019  . History of COVID-19 04/11/2019  . Skin callus 04/08/2019  . Anemia in  chronic kidney disease 01/18/2019  . Chronic kidney disease, stage III (moderate) 01/18/2019  . Multiple myeloma ([place]) 01/18/2019  . History of CVA (cerebrovascular accident) 11/28/2018  . CAD (coronary artery disease) 11/28/2018  . Abnormal stress test   . Pain due to onychomycosis of toenails of both feet 10/18/2018  . Left lower lobe pulmonary nodule 08/18/2017  . Abdominal aortic aneurysm (AAA) without rupture ([place]) 08/18/2017  . Goals of care, counseling/discussion 08/18/2017  . Benign hypertensive heart and CKD, stage 3 (GFR 30-59), w CHF ([place]) 05/29/2017  . Drug-induced constipation  11/07/2016  . Primary osteoarthritis involving multiple joints 11/07/2016  . Simple chronic bronchitis ([place]) 11/07/2016  . Advanced care planning/counseling discussion 11/02/2016  . S/P shoulder replacement 01/14/2016  . Hypothyroid 11/04/2014  . Hyperlipidemia   . Ulcerated varicose veins of leg, right ([place]) 09/30/2014  . Peripheral arterial occlusive disease ([place]) 09/30/2014   Past Medical History:  Diagnosis Date  . AAA (abdominal aortic aneurysm) ([place])    The largest aortic measurement is 4.0 cm on 05/31/19  . Allergy 1980  . Anemia 09-2017  . Arthritis   . Bleeding from varicose vein   . Blood dyscrasia    BLEEDS EASILY   . Blood transfusion without reported diagnosis 2011 or 12  . Cataract ?  . CHF (congestive heart failure) ([place]) ?  . CKD (chronic kidney disease), stage III    Moderate  . Clotting disorder ([place]) ?  Marland Kitchen COPD (chronic obstructive pulmonary disease) ([place])   . Coronary artery disease   . Diabetes mellitus without complication ([place])    patient denies  . Diffuse cystic mastopathy   . Emphysema of lung ([place]) ?  Marland Kitchen Emphysema of lung ([place])   . Heartburn   . Hyperlipidemia   . Hypertension   . Hypothyroidism   . Incomplete left bundle branch block (LBBB) 06/22/2019   Noted on EKG  . L4-L5 disc bulge   . Multiple myeloma ([place])   . Myocardial infarction ([place]) 2019  . Osteoporosis, post-menopausal   . PAD (peripheral artery disease) ([place])   . Renal insufficiency   . Shortness of breath dyspnea    WITH EXERTION   . Stroke ([place])    TIA     2016     WAS FOUND ON SCAN ORDERED FROM NEUROL  . Thyroid disease   . TIA (transient ischemic attack) 2016   left hand weakness  . Ulnar nerve compression, left   . Vertigo 06/22/2019   doing much today 06/28/2019   Relevant past medical, surgical, family and social history reviewed and updated as indicated. Interim medical history since our last visit reviewed.  Review of Systems  Constitutional: Negative.  Negative for  activity change, appetite change and fever.  Respiratory: Positive for shortness of breath. Negative for cough, chest tightness and wheezing.   Cardiovascular: Positive for chest pain. Negative for palpitations and leg swelling.  Gastrointestinal: Negative.  Negative for blood in stool, nausea and vomiting.  Neurological: Negative.  Negative for dizziness, weakness, light-headedness and headaches.  Psychiatric/Behavioral: Positive for sleep disturbance. Negative for confusion. The patient is not nervous/anxious.    Per HPI unless specifically indicated above     Objective:    BP (!) 153/68 (BP Location: Left Arm, Patient Position: Sitting, Cuff Size: Normal)   Pulse 60   Temp 98 F (36.7 C) (Oral)   Ht _0  (1.651 m)   Wt 174 lb (78.9 kg)   SpO2 98%   BMI 28.96 kg/m  Wt Readings from Last 3 Encounters:  09/24/19 174 lb (78.9 kg)  08/30/19 174 lb (78.9 kg)  08/14/19 173 lb 12.8 oz (78.8 kg)    Physical Exam Vitals and nursing note reviewed.  Constitutional:      General: She is not in acute distress.    Appearance: She is normal weight.  Cardiovascular:     Rate and Rhythm: Normal rate and regular rhythm.  Pulmonary:     Effort: Pulmonary effort is normal. No tachypnea, accessory muscle usage or respiratory distress.     Breath sounds: Normal breath sounds. No decreased breath sounds, wheezing, rhonchi or rales.  Abdominal:     General: Bowel sounds are normal. There is no abdominal bruit.     Palpations: Abdomen is soft.  Musculoskeletal:     Right lower leg: No edema.     Left lower leg: No edema.  Skin:    General: Skin is warm and dry.     Coloration: Skin is not cyanotic or pale.  Neurological:     General: No focal deficit present.     Mental Status: She is alert and oriented to person, place, and time.  Psychiatric:        Mood and Affect: Mood normal. Mood is not anxious.        Behavior: Behavior normal.       Assessment & Plan:   Problem List Items  Addressed This Visit      Other   Chest pain - Primary    Acute, ongoing.  EKG negative which is reassuring.  Worried about possible fracture with multiple myeloma diagnosis.  Will obtain chest x-ray today.  Discussed other differentials including costochondritis, muscle strain, and possible treatments for these.  Encouraged deep breathing exercises to prevent pneumonia.  Will await chest x-ray results and in meantime, start diclofenac gel to painful area.  Follow up later this week to monitor for symptom relief as well as to ensure pneumonia is not developing.      Relevant Medications   diclofenac Sodium (VOLTAREN) 1 % GEL   Other Relevant Orders   EKG 12-Lead (Completed)   DG Chest 2 View (Completed)       Follow up plan: Return in about 3 days (around 09/27/2019) for chest pain f/u.

## 2019-09-24 NOTE — Assessment & Plan Note (Addendum)
Acute, ongoing.  EKG negative which is reassuring.  Worried about possible fracture with multiple myeloma diagnosis.  Will obtain chest x-ray today.  Discussed other differentials including costochondritis, muscle strain, and possible treatments for these.  Encouraged deep breathing exercises to prevent pneumonia.  Will await chest x-ray results and in meantime, start diclofenac gel to painful area.  Follow up later this week to monitor for symptom relief as well as to ensure pneumonia is not developing.

## 2019-09-25 ENCOUNTER — Telehealth: Payer: Self-pay | Admitting: Nurse Practitioner

## 2019-09-25 NOTE — Telephone Encounter (Signed)
Copied from [place] [id number]. Topic: General - Inquiry >> Sep 25, 2019  8:40 AM Mathis Bud wrote: Reason for CRM: Patient is requesting a call back nurse regarding xray results from yesterday. Call back [phone]

## 2019-09-25 NOTE — Telephone Encounter (Signed)
See result note. Patient was given result.

## 2019-09-27 ENCOUNTER — Other Ambulatory Visit: Payer: Self-pay

## 2019-09-27 ENCOUNTER — Encounter: Payer: Self-pay | Admitting: Nurse Practitioner

## 2019-09-27 ENCOUNTER — Ambulatory Visit (INDEPENDENT_AMBULATORY_CARE_PROVIDER_SITE_OTHER): Payer: Medicare Other | Admitting: Nurse Practitioner

## 2019-09-27 VITALS — BP 126/74 | HR 60 | Temp 98.6°F | Ht 65.0 in | Wt 174.0 lb

## 2019-09-27 DIAGNOSIS — R0782 Intercostal pain: Secondary | ICD-10-CM | POA: Diagnosis not present

## 2019-09-27 NOTE — Progress Notes (Signed)
BP 126/74 (BP Location: Left Arm, Patient Position: Sitting, Cuff Size: Normal)   Pulse 60   Temp 98.6 F (37 C) (Oral)   Ht '5\' 5"'  (1.651 m)   Wt 174 lb (78.9 kg)   SpO2 95%   BMI 28.96 kg/m    Subjective:    Patient ID: Bailey Ayala, female    DOB: [birth year], 79 y.o.   MRN: [id number]  HPI: Bailey Ayala is a 79 y.o. female presenting for follow up of her sternum pain.  Chief Complaint  Patient presents with  . Chest Pain    Patient states her symptoms are about the same. Patient has not used the gel because her kidney and oncology doctor told her not to take niacin.    CHEST PAIN Patient reports she was moving mulch all last week until last Thursday.  One week ago, she developed pain in the center of her chest.  She presented to the clinic three days ago to evaluate these symptoms and today, reports that her chest pain has improved somewhat.  The chest x-ray did not show acute fractures or pneumonia.  She was leery to try the diclofenac gel because she reports she was told by both her oncologist and nephrologist to not take any NSAIDs.   Time since onset: Duration:weeks - 1 Onset: sudden Quality: burning in sternum, now sharp pain Severity: moderate Location: mid sternum, with deep breath across chest Radiation: none Episode duration: constant Frequency: constant Related to exertion: yes Activity when pain started: woke up from sleep Trauma: no Anxiety/recent stressors: no Aggravating factors: movement, deep breaths, coughing Alleviating factors: Tyenol Status: worse Treatments attempted: APAP  Current pain status: in pain - dull ache Shortness of breath: no Cough: no Nausea: no Diaphoresis: no Heartburn: no Palpitations: no  Allergies  Allergen Reactions  . Oxycodone Other (See Comments)    Made her feel "shaky"  . Codeine Other (See Comments)    hallucinations    . Simvastatin Diarrhea and Nausea Only   Outpatient Encounter Medications as of  09/27/2019  Medication Sig Note  . atorvastatin (LIPITOR) 40 MG tablet Take 1 tablet (40 mg total) by mouth daily.   . benazepril (LOTENSIN) 20 MG tablet Take 1 tablet (20 mg total) by mouth daily.   . ferrous sulfate (FERROUSUL) 325 (65 FE) MG tablet Take 1 tablet (325 mg total) by mouth 3 (three) times daily with meals for 14 days.   Marland Kitchen levothyroxine (SYNTHROID) 100 MCG tablet Take 1 tablet (100 mcg total) by mouth daily.   . meclizine (ANTIVERT) 25 MG tablet Take 1 tablet (25 mg total) by mouth 2 (two) times daily as needed for dizziness. 09/24/2019: PRN  . metoprolol tartrate (LOPRESSOR) 50 MG tablet Take 1 tablet (50 mg total) by mouth 2 (two) times daily.   . Multiple Vitamin (MULTIVITAMIN WITH MINERALS) TABS tablet Take 1 tablet by mouth daily.   Marland Kitchen triamcinolone cream (KENALOG) 0.1 % Apply 1 application topically 2 (two) times daily. (Patient taking differently: Apply 1 application topically 2 (two) times daily as needed (skin irritation/rash.). )   . diclofenac Sodium (VOLTAREN) 1 % GEL Apply 2 g topically 4 (four) times daily. (Patient not taking: Reported on 09/27/2019)    No facility-administered encounter medications on file as of 09/27/2019.   Patient Active Problem List   Diagnosis Date Noted  . Intercostal pain 09/24/2019  . Hyponatremia 08/14/2019  . Vertigo 07/22/2019  . Overweight (BMI 25.0-29.9) 07/03/2019  . Acute blood loss  anemia 07/03/2019  . S/P right THA, AA 07/02/2019  . History of COVID-19 04/11/2019  . Skin callus 04/08/2019  . Anemia in chronic kidney disease 01/18/2019  . Chronic kidney disease, stage III (moderate) 01/18/2019  . Multiple myeloma ([place]) 01/18/2019  . History of CVA (cerebrovascular accident) 11/28/2018  . CAD (coronary artery disease) 11/28/2018  . Abnormal stress test   . Pain due to onychomycosis of toenails of both feet 10/18/2018  . Left lower lobe pulmonary nodule 08/18/2017  . Abdominal aortic aneurysm (AAA) without rupture ([place]) 08/18/2017    . Goals of care, counseling/discussion 08/18/2017  . Benign hypertensive heart and CKD, stage 3 (GFR 30-59), w CHF ([place]) 05/29/2017  . Drug-induced constipation 11/07/2016  . Primary osteoarthritis involving multiple joints 11/07/2016  . Simple chronic bronchitis ([place]) 11/07/2016  . Advanced care planning/counseling discussion 11/02/2016  . S/P shoulder replacement 01/14/2016  . Hypothyroid 11/04/2014  . Hyperlipidemia   . Ulcerated varicose veins of leg, right ([place]) 09/30/2014  . Peripheral arterial occlusive disease ([place]) 09/30/2014   Past Medical History:  Diagnosis Date  . AAA (abdominal aortic aneurysm) ([place])    The largest aortic measurement is 4.0 cm on 05/31/19  . Allergy 1980  . Anemia 09-2017  . Arthritis   . Bleeding from varicose vein   . Blood dyscrasia    BLEEDS EASILY   . Blood transfusion without reported diagnosis 2011 or 12  . Cataract ?  . CHF (congestive heart failure) ([place]) ?  . CKD (chronic kidney disease), stage III    Moderate  . Clotting disorder ([place]) ?  Marland Kitchen COPD (chronic obstructive pulmonary disease) ([place])   . Coronary artery disease   . Diabetes mellitus without complication ([place])    patient denies  . Diffuse cystic mastopathy   . Emphysema of lung ([place]) ?  Marland Kitchen Emphysema of lung ([place])   . Heartburn   . Hyperlipidemia   . Hypertension   . Hypothyroidism   . Incomplete left bundle branch block (LBBB) 06/22/2019   Noted on EKG  . L4-L5 disc bulge   . Multiple myeloma ([place])   . Myocardial infarction ([place]) 2019  . Osteoporosis, post-menopausal   . PAD (peripheral artery disease) ([place])   . Renal insufficiency   . Shortness of breath dyspnea    WITH EXERTION   . Stroke ([place])    TIA     2016     WAS FOUND ON SCAN ORDERED FROM NEUROL  . Thyroid disease   . TIA (transient ischemic attack) 2016   left hand weakness  . Ulnar nerve compression, left   . Vertigo 06/22/2019   doing much today 06/28/2019   Relevant past medical, surgical, family and  social history reviewed and updated as indicated. Interim medical history since our last visit reviewed.  Review of Systems  Constitutional: Negative.  Negative for activity change, appetite change and fever.  Respiratory: Negative.  Negative for cough, chest tightness, shortness of breath and wheezing.   Cardiovascular: Positive for chest pain. Negative for palpitations and leg swelling.  Gastrointestinal: Negative.  Negative for blood in stool, nausea and vomiting.  Neurological: Negative.  Negative for dizziness, weakness, light-headedness and headaches.  Psychiatric/Behavioral: Positive for sleep disturbance. Negative for confusion. The patient is not nervous/anxious.    Per HPI unless specifically indicated above     Objective:    BP 126/74 (BP Location: Left Arm, Patient Position: Sitting, Cuff Size: Normal)   Pulse 60   Temp 98.6 F (37 C) (  Oral)   Ht '5\' 5"'  (1.651 m)   Wt 174 lb (78.9 kg)   SpO2 95%   BMI 28.96 kg/m   Wt Readings from Last 3 Encounters:  09/27/19 174 lb (78.9 kg)  09/24/19 174 lb (78.9 kg)  08/30/19 174 lb (78.9 kg)    Physical Exam Vitals and nursing note reviewed.  Constitutional:      General: She is not in acute distress.    Appearance: She is normal weight.  Cardiovascular:     Rate and Rhythm: Normal rate and regular rhythm.  Pulmonary:     Effort: Pulmonary effort is normal. No tachypnea, accessory muscle usage or respiratory distress.     Breath sounds: Normal breath sounds. No decreased breath sounds, wheezing, rhonchi or rales.  Abdominal:     General: Bowel sounds are normal. There is no abdominal bruit.     Palpations: Abdomen is soft.  Musculoskeletal:     Right lower leg: No edema.     Left lower leg: No edema.  Skin:    General: Skin is warm and dry.     Coloration: Skin is not cyanotic or pale.  Neurological:     General: No focal deficit present.     Mental Status: She is alert and oriented to person, place, and time.    Psychiatric:        Mood and Affect: Mood normal. Mood is not anxious.        Behavior: Behavior normal.       Assessment & Plan:   Problem List Items Addressed This Visit      Other   Intercostal pain - Primary    Acute, ongoing.  Discussed diclofenac gel at length with minimal systemic absorption, especially given very small amount that would be applied to sternal area.  With continued hesitancy, offered lidocaine patches, patient declined for now.  Encouraged to try ice/heat and return to clinic if pain worsens at all or any new questions or concerns.          Follow up plan: Return if symptoms worsen or fail to improve.

## 2019-09-29 NOTE — Assessment & Plan Note (Signed)
Acute, ongoing.  Discussed diclofenac gel at length with minimal systemic absorption, especially given very small amount that would be applied to sternal area.  With continued hesitancy, offered lidocaine patches, patient declined for now.  Encouraged to try ice/heat and return to clinic if pain worsens at all or any new questions or concerns.

## 2019-09-30 ENCOUNTER — Ambulatory Visit (INDEPENDENT_AMBULATORY_CARE_PROVIDER_SITE_OTHER): Payer: Medicare Other | Admitting: Vascular Surgery

## 2019-10-01 ENCOUNTER — Encounter (INDEPENDENT_AMBULATORY_CARE_PROVIDER_SITE_OTHER): Payer: Self-pay | Admitting: Vascular Surgery

## 2019-10-01 ENCOUNTER — Other Ambulatory Visit: Payer: Self-pay

## 2019-10-01 ENCOUNTER — Ambulatory Visit (INDEPENDENT_AMBULATORY_CARE_PROVIDER_SITE_OTHER): Payer: Medicare Other | Admitting: Vascular Surgery

## 2019-10-01 VITALS — BP 147/68 | HR 67 | Resp 16 | Wt 174.0 lb

## 2019-10-01 DIAGNOSIS — I10 Essential (primary) hypertension: Secondary | ICD-10-CM | POA: Diagnosis not present

## 2019-10-01 DIAGNOSIS — L97919 Non-pressure chronic ulcer of unspecified part of right lower leg with unspecified severity: Secondary | ICD-10-CM | POA: Diagnosis not present

## 2019-10-01 DIAGNOSIS — I779 Disorder of arteries and arterioles, unspecified: Secondary | ICD-10-CM | POA: Diagnosis not present

## 2019-10-01 DIAGNOSIS — E782 Mixed hyperlipidemia: Secondary | ICD-10-CM

## 2019-10-01 DIAGNOSIS — I25118 Atherosclerotic heart disease of native coronary artery with other forms of angina pectoris: Secondary | ICD-10-CM

## 2019-10-01 DIAGNOSIS — I83019 Varicose veins of right lower extremity with ulcer of unspecified site: Secondary | ICD-10-CM | POA: Diagnosis not present

## 2019-10-01 DIAGNOSIS — C9000 Multiple myeloma not having achieved remission: Secondary | ICD-10-CM

## 2019-10-01 NOTE — Assessment & Plan Note (Signed)
The patient is doing well status post laser ablation of the right great saphenous vein.  I would recommend sclerotherapy to be performed to the residual varicosities down around the foot and ankle particularly on the right leg after her laser ablation.  This is secondary to previous bleeding in this but and there is still being diffuse varicosities.  Risks and benefits are discussed and she is agreeable to proceed.

## 2019-10-01 NOTE — Progress Notes (Signed)
MRN : [id number]  Bailey Ayala is a 79 y.o. (1941/04/19) female who presents with chief complaint of  Chief Complaint  Patient presents with  . Follow-up    4week post laser  .  History of Present Illness: Patient returns today in follow up of her venous disease about 4 weeks after right great saphenous vein laser ablation.  She does notice a difference in terms of the leg feeling better after the procedure.  There is less swelling and heaviness.  It did take several weeks for the bruising to resolve.  She does still have the prominent varicosities down in the area where she had the bleeding and ulceration that prompted her initial visit.  Current Outpatient Medications  Medication Sig Dispense Refill  . atorvastatin (LIPITOR) 40 MG tablet Take 1 tablet (40 mg total) by mouth daily. 90 tablet 0  . benazepril (LOTENSIN) 20 MG tablet Take 1 tablet (20 mg total) by mouth daily. 90 tablet 0  . levothyroxine (SYNTHROID) 100 MCG tablet Take 1 tablet (100 mcg total) by mouth daily. 90 tablet 0  . meclizine (ANTIVERT) 25 MG tablet Take 1 tablet (25 mg total) by mouth 2 (two) times daily as needed for dizziness. 15 tablet 0  . metoprolol tartrate (LOPRESSOR) 50 MG tablet Take 1 tablet (50 mg total) by mouth 2 (two) times daily. 60 tablet 11  . Multiple Vitamin (MULTIVITAMIN WITH MINERALS) TABS tablet Take 1 tablet by mouth daily.    Marland Kitchen triamcinolone cream (KENALOG) 0.1 % Apply 1 application topically 2 (two) times daily. (Patient taking differently: Apply 1 application topically 2 (two) times daily as needed (skin irritation/rash.). ) 30 g 0  . diclofenac Sodium (VOLTAREN) 1 % GEL Apply 2 g topically 4 (four) times daily. (Patient not taking: Reported on 09/27/2019) 50 g 0  . ferrous sulfate (FERROUSUL) 325 (65 FE) MG tablet Take 1 tablet (325 mg total) by mouth 3 (three) times daily with meals for 14 days. 42 tablet 0   No current facility-administered medications for this visit.    Past  Medical History:  Diagnosis Date  . AAA (abdominal aortic aneurysm) ([place])    The largest aortic measurement is 4.0 cm on 05/31/19  . Allergy 1980  . Anemia 09-2017  . Arthritis   . Bleeding from varicose vein   . Blood dyscrasia    BLEEDS EASILY   . Blood transfusion without reported diagnosis 2011 or 12  . Cataract ?  . CHF (congestive heart failure) ([place]) ?  . CKD (chronic kidney disease), stage III    Moderate  . Clotting disorder ([place]) ?  Marland Kitchen COPD (chronic obstructive pulmonary disease) ([place])   . Coronary artery disease   . Diabetes mellitus without complication ([place])    patient denies  . Diffuse cystic mastopathy   . Emphysema of lung ([place]) ?  Marland Kitchen Emphysema of lung ([place])   . Heartburn   . Hyperlipidemia   . Hypertension   . Hypothyroidism   . Incomplete left bundle branch block (LBBB) 06/22/2019   Noted on EKG  . L4-L5 disc bulge   . Multiple myeloma ([place])   . Myocardial infarction ([place]) 2019  . Osteoporosis, post-menopausal   . PAD (peripheral artery disease) ([place])   . Renal insufficiency   . Shortness of breath dyspnea    WITH EXERTION   . Stroke ([place])    TIA     2016     WAS FOUND ON SCAN ORDERED FROM NEUROL  .  Thyroid disease   . TIA (transient ischemic attack) 2016   left hand weakness  . Ulnar nerve compression, left   . Vertigo 06/22/2019   doing much today 06/28/2019    Past Surgical History:  Procedure Laterality Date  . ABDOMINAL HYSTERECTOMY  1990  . BACK SURGERY  2011  . BREAST EXCISIONAL BIOPSY Left    2010?   Marland Kitchen BREAST SURGERY  1975   A lump removed left breast  . CATARACT EXTRACTION, BILATERAL    . COLONOSCOPY  1998  . EYE SURGERY Bilateral 05/06/14  . JOINT REPLACEMENT     LT KNEE  . KNEE SURGERY Left 2012  . LEFT HEART CATH AND CORONARY ANGIOGRAPHY Left 11/19/2018   Procedure: LEFT HEART CATH AND CORONARY ANGIOGRAPHY;  Surgeon: Wellington Hampshire, MD;  Location: [facility];  Service: Cardiovascular;  Laterality: Left;  . OOPHORECTOMY   1990  . parathyroid transplant     2/3 THYROID REMOVED   (GOITER)  . SPINE SURGERY    . THYROID SURGERY    . TONSILLECTOMY    . TOTAL HIP ARTHROPLASTY Right 07/02/2019   Procedure: TOTAL HIP ARTHROPLASTY ANTERIOR APPROACH;  Surgeon: Paralee Cancel, MD;  Location: [facility];  Service: Orthopedics;  Laterality: Right;  70 mins  . TOTAL SHOULDER ARTHROPLASTY Left 01/14/2016   Procedure: LEFT TOTAL SHOULDER ARTHROPLASTY;  Surgeon: Justice Britain, MD;  Location: [facility];  Service: Orthopedics;  Laterality: Left;  . TOTAL SHOULDER ARTHROPLASTY Right 11/03/2016   Procedure: RIGHT TOTAL SHOULDER ARTHROPLASTY;  Surgeon: Justice Britain, MD;  Location: [facility];  Service: Orthopedics;  Laterality: Right;  . ULNAR NERVE REPAIR     LEFT  . vein closure procedure Left 2010     Social History   Tobacco Use  . Smoking status: Former Smoker    Packs/day: 3.00    Years: 25.00    Pack years: 75.00    Types: Cigarettes    Quit date: 11/03/1989    Years since quitting: 29.9  . Smokeless tobacco: Never Used  Substance Use Topics  . Alcohol use: Not Currently    Alcohol/week: 0.0 standard drinks  . Drug use: No      Family History  Problem Relation Age of Onset  . Heart disease Mother        MI  . Arthritis Mother   . Stroke Mother   . Hyperlipidemia Mother   . Hypertension Mother   . Varicose Veins Mother   . Heart disease Father        MI  . Hyperlipidemia Father   . Arthritis Father   . Cancer Father   . COPD Father   . Hearing loss Father   . Hypertension Father   . Vision loss Father   . Diabetes Sister   . COPD Sister   . Heart disease Sister   . Hyperlipidemia Sister   . Hypertension Sister   . Hyperlipidemia Brother   . COPD Brother   . Diabetes Brother   . Heart disease Brother   . Hypertension Brother   . Arthritis Sister   . Hyperlipidemia Sister   . Hypertension Sister   . Vision loss Sister   . Varicose Veins Sister   . Arthritis Brother   . Varicose Veins Brother   .  Arthritis Daughter   . Arthritis Daughter   . Diabetes Daughter   . Cancer Paternal Aunt   . COPD Sister   . Heart disease Sister   . Hyperlipidemia  Sister   . Hypertension Sister   . Varicose Veins Sister   . Heart disease Maternal Uncle   . Heart disease Paternal Uncle      Allergies  Allergen Reactions  . Oxycodone Other (See Comments)    Made her feel "shaky"  . Codeine Other (See Comments)    hallucinations    . Simvastatin Diarrhea and Nausea Only   REVIEW OF SYSTEMS (Negative unless checked)  Constitutional: []?Weight loss  []?Fever  []?Chills Cardiac: []?Chest pain   []?Chest pressure   []?Palpitations   []?Shortness of breath when laying flat   []?Shortness of breath at rest   []?Shortness of breath with exertion. Vascular:  []?Pain in legs with walking   []?Pain in legs at rest   []?Pain in legs when laying flat   []?Claudication   []?Pain in feet when walking  []?Pain in feet at rest  []?Pain in feet when laying flat   []?History of DVT   []?Phlebitis   []?Swelling in legs   [x]?Varicose veins   [x]?Non-healing ulcers Pulmonary:   []?Uses home oxygen   []?Productive cough   []?Hemoptysis   []?Wheeze  []?COPD   []?Asthma Neurologic:  []?Dizziness  []?Blackouts   []?Seizures   []?History of stroke   []?History of TIA  []?Aphasia   []?Temporary blindness   []?Dysphagia   []?Weakness or numbness in arms   []?Weakness or numbness in legs Musculoskeletal:  [x]?Arthritis   []?Joint swelling   [x]?Joint pain   []?Low back pain Hematologic:  [x]?Easy bruising  []?Easy bleeding   []?Hypercoagulable state   [x]?Anemic  []?Hepatitis Gastrointestinal:  []?Blood in stool   []?Vomiting blood  []?Gastroesophageal reflux/heartburn   []?Abdominal pain Genitourinary:  [x]?Chronic kidney disease   []?Difficult urination  []?Frequent urination  []?Burning with urination   []?Hematuria Skin:  []?Rashes   []?Ulcers   []?Wounds Psychological:  []?History of anxiety   []? History of major  depression.   Physical Examination  BP (!) 147/68 (BP Location: Right Arm)   Pulse 67   Resp 16   Wt 174 lb (78.9 kg)   BMI 28.96 kg/m  Gen:  WD/WN, NAD Head: [place]/AT, No temporalis wasting. Ear/Nose/Throat: Hearing grossly intact, nares w/o erythema or drainage Eyes: Conjunctiva clear. Sclera non-icteric Neck: Supple.  Trachea midline Pulmonary:  Good air movement, no use of accessory muscles.  Cardiac: RRR, no JVD Vascular:  Vessel Right Left  Radial Palpable Palpable               Musculoskeletal: M/S 5/5 throughout.  No deformity or atrophy.  Diffuse varicosities around the foot and ankle more so on the right than the left.  The scab overlying her previous ulceration is still present.  Trace bilateral lower extremity edema. Neurologic: Sensation grossly intact in extremities.  Symmetrical.  Speech is fluent.  Psychiatric: Judgment intact, Mood & affect appropriate for pt's clinical situation. Dermatologic: No rashes or ulcers noted.  No cellulitis or open wounds.       Labs Recent Results (from the past 2160 hour(s))  CBC     Status: Abnormal   Collection Time: 07/04/19  2:47 AM  Result Value Ref Range   WBC 4.9 4.0 - 10.5 K/uL   RBC 2.90 (L) 3.87 - 5.11 MIL/uL   Hemoglobin 9.5 (L) 12.0 - 15.0 g/dL    Comment: REPEATED TO VERIFY POST TRANSFUSION SPECIMEN    HCT 30.6 (L) 36.0 - 46.0 %   MCV 105.5 (H) 80.0 - 100.0 fL  MCH 32.8 26.0 - 34.0 pg   MCHC 31.0 30.0 - 36.0 g/dL   RDW 18.4 (H) 11.5 - 15.5 %   Platelets 145 (L) 150 - 400 K/uL   nRBC 0.0 0.0 - 0.2 %    Comment: Performed at [facility], [place] [address], [place], [place] [address]  Basic metabolic panel     Status: Abnormal   Collection Time: 07/04/19  2:47 AM  Result Value Ref Range   Sodium 133 (L) 135 - 145 mmol/L   Potassium 4.2 3.5 - 5.1 mmol/L    Comment: DELTA CHECK NOTED   Chloride 105 98 - 111 mmol/L   CO2 20 (L) 22 - 32 mmol/L   Glucose, Bld 115 (H) 70 - 99 mg/dL     Comment: Glucose reference range applies only to samples taken after fasting for at least 8 hours.   BUN 23 8 - 23 mg/dL   Creatinine, Ser 1.34 (H) 0.44 - 1.00 mg/dL   Calcium 8.4 (L) 8.9 - 10.3 mg/dL   GFR calc non Af Amer 38 (L) >60 mL/min   GFR calc Af Amer 44 (L) >60 mL/min   Anion gap 8 5 - 15    Comment: Performed at [facility], [place] [address], [place], [place] [address]  CBC     Status: Abnormal   Collection Time: 07/05/19  2:49 AM  Result Value Ref Range   WBC 4.8 4.0 - 10.5 K/uL   RBC 2.71 (L) 3.87 - 5.11 MIL/uL   Hemoglobin 9.0 (L) 12.0 - 15.0 g/dL   HCT 28.6 (L) 36.0 - 46.0 %   MCV 105.5 (H) 80.0 - 100.0 fL   MCH 33.2 26.0 - 34.0 pg   MCHC 31.5 30.0 - 36.0 g/dL   RDW 17.5 (H) 11.5 - 15.5 %   Platelets 170 150 - 400 K/uL   nRBC 0.0 0.0 - 0.2 %    Comment: Performed at [facility], [place] [address] [place] [address], [place], [place] [address]  Basic metabolic panel     Status: Abnormal   Collection Time: 07/17/19  4:36 PM  Result Value Ref Range   Sodium 137 135 - 145 mmol/L   Potassium 4.7 3.5 - 5.1 mmol/L   Chloride 103 98 - 111 mmol/L   CO2 24 22 - 32 mmol/L   Glucose, Bld 133 (H) 70 - 99 mg/dL    Comment: Glucose reference range applies only to samples taken after fasting for at least 8 hours.   BUN 27 (H) 8 - 23 mg/dL   Creatinine, Ser 1.34 (H) 0.44 - 1.00 mg/dL   Calcium 10.0 8.9 - 10.3 mg/dL   GFR calc non Af Amer 38 (L) >60 mL/min   GFR calc Af Amer 44 (L) >60 mL/min   Anion gap 10 5 - 15    Comment: Performed at [facility], [place] [place], [place] [address]  CBC     Status: Abnormal   Collection Time: 07/17/19  4:36 PM  Result Value Ref Range   WBC 5.1 4.0 - 10.5 K/uL   RBC 3.05 (L) 3.87 - 5.11 MIL/uL   Hemoglobin 9.9 (L) 12.0 - 15.0 g/dL   HCT 31.1 (L) 36.0 - 46.0 %   MCV 102.0 (H) 80.0 - 100.0 fL   MCH 32.5 26.0 - 34.0 pg   MCHC 31.8 30.0 - 36.0 g/dL   RDW 15.5 11.5 - 15.5 %   Platelets 359 150 - 400 K/uL  nRBC 0.0 0.0 - 0.2 %    Comment: Performed at [facility], [address] [place], [place] [address]  Lipase, blood     Status: None   Collection Time: 07/17/19  4:36 PM  Result Value Ref Range   Lipase 39 11 - 51 U/L    Comment: Performed at [facility], [address] [place], [place] [address]  Hepatic function panel     Status: Abnormal   Collection Time: 07/17/19  4:36 PM  Result Value Ref Range   Total Protein 8.9 (H) 6.5 - 8.1 g/dL   Albumin 3.8 3.5 - 5.0 g/dL   AST 19 15 - 41 U/L   ALT 12 0 - 44 U/L   Alkaline Phosphatase 84 38 - 126 U/L   Total Bilirubin 0.5 0.3 - 1.2 mg/dL   Bilirubin, Direct <0.1 0.0 - 0.2 mg/dL   Indirect Bilirubin NOT CALCULATED 0.3 - 0.9 mg/dL    Comment: Performed at [facility], [address] [place], [place] [address]  Protein electrophoresis, serum     Status: Abnormal   Collection Time: 08/07/19  3:07 PM  Result Value Ref Range   Total Protein ELP 8.3 6.0 - 8.5 g/dL   Albumin ELP 4.1 2.9 - 4.4 g/dL   Alpha-1-Globulin 0.2 0.0 - 0.4 g/dL   Alpha-2-Globulin 1.0 0.4 - 1.0 g/dL   Beta Globulin 0.9 0.7 - 1.3 g/dL   Gamma Globulin 2.0 (H) 0.4 - 1.8 g/dL   M-Spike, % 1.6 (H) Not Observed g/dL   SPE Interp. Comment     Comment: (NOTE) The SPE pattern demonstrates a single peak (M-spike) in the gamma region which may represent monoclonal protein. This peak may also be caused by circulating immune complexes, cryoglobulins, C-reactive protein, fibrinogen or hemolysis.  If clinically indicated, the presence of a monoclonal gammopathy may be confirmed by immuno- fixation, as well as an evaluation of the urine for the presence of Bence-Jones protein. Performed At: [facility] [place], [place] [id number] Rush Farmer MD UX:[id number]    Comment Comment     Comment: (NOTE) Protein electrophoresis scan will follow via computer, mail, or courier delivery.    Globulin, Total 4.2 (H) 2.2 -  3.9 g/dL   A/G Ratio 1.0 0.7 - 1.7  Kappa/lambda light chains     Status: Abnormal   Collection Time: 08/07/19  3:07 PM  Result Value Ref Range   Kappa free light chain 15.9 3.3 - 19.4 mg/L   Lamda free light chains 341.6 (H) 5.7 - 26.3 mg/L   Kappa, lamda light chain ratio 0.05 (L) 0.26 - 1.65    Comment: (NOTE) Performed At: [facility] [place], [place] [id number] Rush Farmer MD JS:[id number]   Comprehensive metabolic panel     Status: Abnormal   Collection Time: 08/07/19  3:07 PM  Result Value Ref Range   Sodium 129 (L) 135 - 145 mmol/L   Potassium 4.2 3.5 - 5.1 mmol/L   Chloride 98 98 - 111 mmol/L   CO2 24 22 - 32 mmol/L   Glucose, Bld 90 70 - 99 mg/dL    Comment: Glucose reference range applies only to samples taken after fasting for at least 8 hours.   BUN 30 (H) 8 - 23 mg/dL   Creatinine, Ser 1.27 (H) 0.44 - 1.00 mg/dL   Calcium 9.0 8.9 - 10.3 mg/dL   Total Protein 8.8 (H) 6.5 - 8.1 g/dL   Albumin 4.0 3.5 -  5.0 g/dL   AST 19 15 - 41 U/L   ALT 15 0 - 44 U/L   Alkaline Phosphatase 79 38 - 126 U/L   Total Bilirubin 0.1 (L) 0.3 - 1.2 mg/dL   GFR calc non Af Amer 40 (L) >60 mL/min   GFR calc Af Amer 47 (L) >60 mL/min   Anion gap 7 5 - 15    Comment: Performed at [facility], [address], [place], [place] [address]  CBC with Differential/Platelet     Status: Abnormal   Collection Time: 08/07/19  3:07 PM  Result Value Ref Range   WBC 4.9 4.0 - 10.5 K/uL   RBC 2.82 (L) 3.87 - 5.11 MIL/uL   Hemoglobin 9.5 (L) 12.0 - 15.0 g/dL   HCT 29.0 (L) 36.0 - 46.0 %   MCV 102.8 (H) 80.0 - 100.0 fL   MCH 33.7 26.0 - 34.0 pg   MCHC 32.8 30.0 - 36.0 g/dL   RDW 16.1 (H) 11.5 - 15.5 %   Platelets 226 150 - 400 K/uL   nRBC 0.0 0.0 - 0.2 %   Neutrophils Relative % 59 %   Neutro Abs 2.9 1.7 - 7.7 K/uL   Lymphocytes Relative 26 %   Lymphs Abs 1.3 0.7 - 4.0 K/uL   Monocytes Relative 10 %   Monocytes Absolute 0.5 0.1 - 1.0 K/uL   Eosinophils  Relative 5 %   Eosinophils Absolute 0.3 0.0 - 0.5 K/uL   Basophils Relative 0 %   Basophils Absolute 0.0 0.0 - 0.1 K/uL   Immature Granulocytes 0 %   Abs Immature Granulocytes 0.02 0.00 - 0.07 K/uL    Comment: Performed at [facility], [address], [place], [place] [address]  Basic metabolic panel     Status: Abnormal   Collection Time: 08/14/19  2:01 PM  Result Value Ref Range   Glucose 99 65 - 99 mg/dL   BUN 19 8 - 27 mg/dL   Creatinine, Ser 1.19 (H) 0.57 - 1.00 mg/dL   GFR calc non Af Amer 44 (L) >59 mL/min/1.73   GFR calc Af Amer 51 (L) >59 mL/min/1.73   BUN/Creatinine Ratio 16 12 - 28   Sodium 136 134 - 144 mmol/L   Potassium 4.8 3.5 - 5.2 mmol/L   Chloride 102 96 - 106 mmol/L   CO2 22 20 - 29 mmol/L   Calcium 9.5 8.7 - 10.3 mg/dL  Thyroid Panel With TSH     Status: None   Collection Time: 08/30/19  1:54 PM  Result Value Ref Range   TSH 0.693 0.450 - 4.500 uIU/mL   T4, Total 7.5 4.5 - 12.0 ug/dL   T3 Uptake Ratio 26 24 - 39 %   Free Thyroxine Index 2.0 1.2 - 4.9    Radiology DG Chest 2 View  Result Date: 09/24/2019 CLINICAL DATA:  79 year old female with history of mid sternal pain. EXAM: CHEST - 2 VIEW COMPARISON:  Chest x-ray 06/22/2019. FINDINGS: Lung volumes are normal. No consolidative airspace disease. No pleural effusions. No pneumothorax. No pulmonary nodule or mass noted. Pulmonary vasculature and the cardiomediastinal silhouette are within normal limits. Atherosclerosis in the thoracic aorta. Status post right shoulder arthroplasty appeared orthopedic fixation hardware incompletely imaged in the lumbar spine. IMPRESSION: 1.  No radiographic evidence of acute cardiopulmonary disease. 2. Aortic atherosclerosis. Electronically Signed   By: Vinnie Langton M.D.   On: 09/24/2019 16:47   VAS [place] LOWER EXT VENOUS POST ABLATION  Result  Date: 09/03/2019  Lower Venous Reflux Study Indications: Varicosities, and Pain.  Risk Factors: Surgery Right proximal &  mid thigh saphenous vein ablation on 08/30/19. Performing Technologist: Blondell Reveal RT, RDMS, RVT  Examination Guidelines: A complete evaluation includes B-mode imaging, spectral Doppler, color Doppler, and power Doppler as needed of all accessible portions of each vessel. Bilateral testing is considered an integral part of a complete examination. Limited examinations for reoccurring indications may be performed as noted. The reflux portion of the exam is performed with the patient in reverse Trendelenburg. Significant venous reflux is defined as >500 ms in the superficial venous system, and >1 second in the deep venous system.  Venous Reflux Times +--------------+---------+------+-----------+------------+---------------------+ RIGHT         Reflux NoRefluxReflux TimeDiameter cmsComments                                      Yes                                               +--------------+---------+------+-----------+------------+---------------------+ CFV                                                 Compressible          +--------------+---------+------+-----------+------------+---------------------+ FV prox                                             Compressible          +--------------+---------+------+-----------+------------+---------------------+ FV mid                                              Compressible          +--------------+---------+------+-----------+------------+---------------------+ FV dist                                             Compressible          +--------------+---------+------+-----------+------------+---------------------+ Popliteal                                           Compressible          +--------------+---------+------+-----------+------------+---------------------+ GSV at [facility]                                          Compressible           +--------------+---------+------+-----------+------------+---------------------+ GSV prox thigh                                      Closed                +--------------+---------+------+-----------+------------+---------------------+  GSV mid thigh                                       Closed                +--------------+---------+------+-----------+------------+---------------------+ GSV dist thigh                                      Closed                +--------------+---------+------+-----------+------------+---------------------+ GSV at knee                                         Closed                +--------------+---------+------+-----------+------------+---------------------+ GSV prox calf                                       Closed                +--------------+---------+------+-----------+------------+---------------------+ GSV mid calf                                        Compressible          +--------------+---------+------+-----------+------------+---------------------+                                                     Patent along medial                                                       mid-distal thigh and                                                      knee/calf areas       +--------------+---------+------+-----------+------------+---------------------+  Summary: Right: - No evidence of deep vein thrombosis seen in the right lower extremity, from the common femoral through the popliteal veins. - The great saphenous vein appears occluded from the proximal calf to roughly 3.6cm from the SFJ.  *See table(s) above for measurements and observations. Electronically signed by Leotis Pain MD on 09/03/2019 at 11:47:16 AM.    Final     Assessment/Plan Hypertension blood pressure control important in reducing the progression of atherosclerotic disease. On appropriate oral medications.   Hyperlipidemia lipid control  important in reducing the progression of atherosclerotic disease. Continue statin therapy   CAD (coronary artery disease) Continue cardiac and antihypertensive medications as already ordered and reviewed, no changes at this time. Continue statin as ordered and reviewed, no changes at this time Nitrates PRN for chest pain  Multiple myeloma (HCC) Followed by oncology  Ulcerated varicose veins of leg, right ([place]) The patient is doing well status post laser ablation of the right great saphenous vein.  I would recommend sclerotherapy to be performed to the residual varicosities down around the foot and ankle particularly on the right leg after her laser ablation.  This is secondary to previous bleeding in this but and there is still being diffuse varicosities.  Risks and benefits are discussed and she is agreeable to proceed.    Leotis Pain, MD  10/01/2019 12:05 PM    This note was created with Dragon medical transcription system.  Any errors from dictation are purely unintentional

## 2019-10-01 NOTE — Patient Instructions (Signed)
Sclerotherapy  Sclerotherapy is a procedure that is done to improve the appearance of varicose veins and spider veins and to help relieve aching, swelling, cramping, and pain in the legs. Varicose veins are veins that have become enlarged, bulging, and twisted due to a damaged valve that causes blood to collect (pool) in the veins. Spider veins are small varicose veins. Sclerotherapy usually works best for smaller spider and varicose veins. This procedure involves injecting a chemical into the vein to close it off. You may need more than one treatment to close a vein all the way. Sclerotherapy is usually performed on the legs because that is where varicose and spider veins most often occur. Tell a health care provider about:  Any allergies you have.  All medicines you are taking, including vitamins, herbs, eye drops, creams, and over-the-counter medicines.  Any blood disorders you have.  Any surgeries you have had.  Any medical conditions you have.  Whether you are pregnant or may be pregnant. What are the risks? Generally, this is a safe procedure. However, problems may occur, including:  Infection.  Bleeding.  Allergic reactions to medicines or dyes.  Blood clots.  Nerve damage.  Bruising and scarring.  Darkened skin around the area. What happens before the procedure?  Do not use lotions or creams on your legs unless your health care provider approves.  Follow instructions from your health care provider about eating and drinking restrictions.  Do not use any products that contain nicotine or tobacco, such as cigarettes and e-cigarettes. If you need help quitting, ask your health care provider.  Ask your health care provider about: ? Changing or stopping your regular medicines. This is especially important if you are taking diabetes medicines or blood thinners. ? Taking medicines such as aspirin and ibuprofen. These medicines can thin your blood. Do not take these  medicines before your procedure if your health care provider instructs you not to.  You may have an ultrasound of the affected area to check for blood clots and to check blood flow.  In rare cases, you may have an X-ray procedure to check how blood flows through your veins (angiogram). For an angiogram, a dye is injected to outline your veins on X-rays. What happens during the procedure?  To lower your risk of infection: ? Your health care team will wash or sanitize their hands. ? Your skin will be washed with soap. ? Hair may be removed from the treatment area.  A small, thin needle will be used to inject a chemical (sclerosant) into your varicose vein. The sclerosant will irritate the lining of the vein and cause the vein to close below the injection site. You may feel some stinging, burning, or irritation.  The injection may be repeated for more than one varicose vein.  The injection area will be wrapped with elastic bandages. The procedure may vary among health care providers and hospitals. What happens after the procedure?  Your injection area will be wrapped with elastic bandages. If there is bleeding, the bandages may be changed.  Do not drive until your health care provider approves. You may need to wait 1-2 days before driving.  You will need to wear compression stockings for about a week, or as long as your health care provider recommends. Summary  Sclerotherapy is a procedure that is done to improve the appearance of varicose veins and spider veins and to help relieve aching, swelling, cramping, and pain in the legs.  A small, thin needle is   used to inject a chemical (sclerosant) into a spider vein or varicose vein to close it off.  Elastic bandages will be wrapped around the injection area after the procedure. This information is not intended to replace advice given to you by your health care provider. Make sure you discuss any questions you have with your health care  provider. Document Revised: 08/03/2018 Document Reviewed: 05/31/2016 Elsevier Patient Education  2020 [employer]  

## 2019-10-09 DIAGNOSIS — I10 Essential (primary) hypertension: Secondary | ICD-10-CM | POA: Diagnosis not present

## 2019-10-09 DIAGNOSIS — R809 Proteinuria, unspecified: Secondary | ICD-10-CM | POA: Diagnosis not present

## 2019-10-09 DIAGNOSIS — D631 Anemia in chronic kidney disease: Secondary | ICD-10-CM | POA: Diagnosis not present

## 2019-10-09 DIAGNOSIS — N184 Chronic kidney disease, stage 4 (severe): Secondary | ICD-10-CM | POA: Diagnosis not present

## 2019-10-09 DIAGNOSIS — C9000 Multiple myeloma not having achieved remission: Secondary | ICD-10-CM | POA: Diagnosis not present

## 2019-10-14 ENCOUNTER — Encounter: Payer: Self-pay | Admitting: Nurse Practitioner

## 2019-10-14 ENCOUNTER — Ambulatory Visit (INDEPENDENT_AMBULATORY_CARE_PROVIDER_SITE_OTHER): Payer: Medicare Other | Admitting: Nurse Practitioner

## 2019-10-14 ENCOUNTER — Other Ambulatory Visit: Payer: Self-pay

## 2019-10-14 VITALS — BP 106/53 | HR 61 | Temp 97.8°F | Wt 176.4 lb

## 2019-10-14 DIAGNOSIS — I130 Hypertensive heart and chronic kidney disease with heart failure and stage 1 through stage 4 chronic kidney disease, or unspecified chronic kidney disease: Secondary | ICD-10-CM

## 2019-10-14 DIAGNOSIS — E039 Hypothyroidism, unspecified: Secondary | ICD-10-CM

## 2019-10-14 DIAGNOSIS — C9000 Multiple myeloma not having achieved remission: Secondary | ICD-10-CM | POA: Diagnosis not present

## 2019-10-14 DIAGNOSIS — I779 Disorder of arteries and arterioles, unspecified: Secondary | ICD-10-CM

## 2019-10-14 DIAGNOSIS — E782 Mixed hyperlipidemia: Secondary | ICD-10-CM

## 2019-10-14 DIAGNOSIS — D631 Anemia in chronic kidney disease: Secondary | ICD-10-CM

## 2019-10-14 DIAGNOSIS — N1832 Chronic kidney disease, stage 3b: Secondary | ICD-10-CM | POA: Diagnosis not present

## 2019-10-14 DIAGNOSIS — N183 Chronic kidney disease, stage 3 unspecified: Secondary | ICD-10-CM

## 2019-10-14 DIAGNOSIS — E663 Overweight: Secondary | ICD-10-CM

## 2019-10-14 MED ORDER — LEVOTHYROXINE SODIUM 100 MCG PO TABS: 100.0000 ug | ORAL_TABLET | Freq: Every day | ORAL | 4 refills | Status: AC

## 2019-10-14 MED ORDER — BENAZEPRIL HCL 20 MG PO TABS: 20.0000 mg | ORAL_TABLET | Freq: Every day | ORAL | 4 refills | Status: AC

## 2019-10-14 MED ORDER — ATORVASTATIN CALCIUM 40 MG PO TABS: 40.0000 mg | ORAL_TABLET | Freq: Every day | ORAL | 4 refills | Status: AC

## 2019-10-14 NOTE — Assessment & Plan Note (Signed)
Chronic, ongoing.  Continue current medication regimen and adjust as needed based on labs.  Recent labs obtained and WNL.

## 2019-10-14 NOTE — Assessment & Plan Note (Signed)
Chronic, ongoing.  Continue current medication regimen and adjust as needed.  Recent labs performed.

## 2019-10-14 NOTE — Assessment & Plan Note (Signed)
Chronic, progressive.  She refuses treatment.  Wishes to focus on quality vs quantity of life.  Is a DNR.  Recommend referral to either palliative or hospice level care, she wishes to think about these.  Both would offer more support to her locally.  Continue collaboration with oncology team.

## 2019-10-14 NOTE — Assessment & Plan Note (Signed)
Chronic, ongoing.  Continue collaboration with nephrology, recent labs performed by them.

## 2019-10-14 NOTE — Assessment & Plan Note (Signed)
Chronic, ongoing with BP well below goal today.  Recommend she check BP at home at least daily and document + notify provider if low readings noted at home.  May need to consider reduction of medications, as is having occasional dizziness with position change.  Will continue collaboration with cardiology & nephrology and current medication regimen, adjust this as needed.  Recent labs done.  Recommend increased fluid intake at home, which she endorses she is not good about.  Return in 6 months.

## 2019-10-14 NOTE — Assessment & Plan Note (Signed)
Recommended eating smaller high protein, low fat meals more frequently and exercising 30 mins a day 5 times a week with a goal of 10-15lb weight loss in the next 3 months. Patient voiced their understanding and motivation to adhere to these recommendations.  

## 2019-10-14 NOTE — Progress Notes (Signed)
BP (!) 106/53   Pulse 61   Temp 97.8 F (36.6 C) (Oral)   Wt 176 lb 6.4 oz (80 kg)   SpO2 95%   BMI 29.35 kg/m    Subjective:    Patient ID: Bailey Ayala, female    DOB: 10/24/1940, 79 y.o.   MRN: [id number]  HPI: Bailey Ayala is a 79 y.o. female  Chief Complaint  Patient presents with  . Multiple Myeloma  . Hyperlipidemia  . Hypertension   SMOLDERING MULTIPLE MYELOMA: Her cancer (smoldering multiple myeloma) is progressing, but she is refusing treatment. Last saw oncology on 09/06/2019, Dr. Mike Gip.  At prior visit with PCP she had reported wishes not to pursue treatment and reports may do palliative, she continues to report wishes not to pursue treatment.  Has a document stating her wishes and her daughter's have copy of this, per patient. She wishes to be DNR.  She discussed how she researched multiple myeloma and read that life span is anywhere from 3 to 5 years and she has lived 3 years so far, but she knows this progresses and there is no cure.  Does not want to go through treatments and feel bad, would prefer to focus on quality of life.  Discussed at length with her options of palliative vs hospice level care at this time, as she reports not having a lot of support locally, her daughters live a distance away.  She wishes to think about these options further, but is leaning towards hospice.  HYPERTENSION / HYPERLIPIDEMIA Continues on Benazepril 20 MG, Metoprolol 50 MG BID, and Atorvastatin 40 MG daily.  Last labs with CKD stable CRT 1.19 and GFR 44 in April 2021.  Saw Dr. Holley Raring with nephrology on 10/09/2019 -- labs done and CRT 1.28 and GFR 40, NA+ 134 -- no changes made.   Satisfied with current treatment? yes Duration of hypertension: chronic BP monitoring frequency: not checking BP range:  BP medication side effects: no Duration of hyperlipidemia: chronic Cholesterol medication side effects: no Cholesterol supplements: none Medication compliance: good  compliance Aspirin: no Recent stressors: no Recurrent headaches: no Visual changes: no Palpitations: no Dyspnea: no Chest pain: no Lower extremity edema: no Dizzy/lightheaded: occasional   HYPOTHYROIDISM Continues on Levothyroxine 100 MCG with last TSH 0.693 and T4 7.5 in May 2021. Thyroid control status:stable Satisfied with current treatment? yes Medication side effects: no Medication compliance: good compliance Etiology of hypothyroidism:  Recent dose adjustment:no Fatigue: no Cold intolerance: no Heat intolerance: no Weight gain: no Weight loss: no Constipation: no Diarrhea/loose stools: no Palpitations: no Lower extremity edema: no Anxiety/depressed mood: no  Relevant past medical, surgical, family and social history reviewed and updated as indicated. Interim medical history since our last visit reviewed. Allergies and medications reviewed and updated.  Review of Systems  Constitutional: Negative for activity change, appetite change, diaphoresis, fatigue and fever.  Respiratory: Negative for cough, chest tightness and shortness of breath.   Cardiovascular: Negative for chest pain, palpitations and leg swelling.  Gastrointestinal: Negative.   Musculoskeletal: Negative.   Neurological: Positive for dizziness (occasional with position change).  Psychiatric/Behavioral: Negative.     Per HPI unless specifically indicated above     Objective:    BP (!) 106/53   Pulse 61   Temp 97.8 F (36.6 C) (Oral)   Wt 176 lb 6.4 oz (80 kg)   SpO2 95%   BMI 29.35 kg/m   Wt Readings from Last 3 Encounters:  10/14/19 176 lb 6.4  oz (80 kg)  10/01/19 174 lb (78.9 kg)  09/27/19 174 lb (78.9 kg)    Physical Exam Vitals and nursing note reviewed.  Constitutional:      General: She is awake. She is not in acute distress.    Appearance: She is well-developed and well-groomed. She is not ill-appearing.  HENT:     Head: Normocephalic.     Right Ear: Hearing normal.     Left  Ear: Hearing normal.  Eyes:     General: Lids are normal.        Right eye: No discharge.        Left eye: No discharge.     Conjunctiva/sclera: Conjunctivae normal.     Pupils: Pupils are equal, round, and reactive to light.  Neck:     Vascular: No carotid bruit.  Cardiovascular:     Rate and Rhythm: Normal rate and regular rhythm.     Heart sounds: Normal heart sounds. No murmur heard.  No gallop.   Pulmonary:     Effort: Pulmonary effort is normal. No accessory muscle usage or respiratory distress.     Breath sounds: Normal breath sounds.  Abdominal:     General: Bowel sounds are normal.     Palpations: Abdomen is soft.  Musculoskeletal:     Cervical back: Normal range of motion and neck supple.     Right lower leg: No edema.     Left lower leg: No edema.  Skin:    General: Skin is warm and dry.  Neurological:     Mental Status: She is alert and oriented to person, place, and time.  Psychiatric:        Attention and Perception: Attention normal.        Mood and Affect: Mood normal.        Speech: Speech normal.        Behavior: Behavior normal. Behavior is cooperative.        Thought Content: Thought content normal.     Results for orders placed or performed in visit on 08/30/19  Thyroid Panel With TSH  Result Value Ref Range   TSH 0.693 0.450 - 4.500 uIU/mL   T4, Total 7.5 4.5 - 12.0 ug/dL   T3 Uptake Ratio 26 24 - 39 %   Free Thyroxine Index 2.0 1.2 - 4.9      Assessment & Plan:   Problem List Items Addressed This Visit      Cardiovascular and Mediastinum   Benign hypertensive heart and CKD, stage 3 (GFR 30-59), w CHF (HCC)    Chronic, ongoing with BP well below goal today.  Recommend she check BP at home at least daily and document + notify provider if low readings noted at home.  May need to consider reduction of medications, as is having occasional dizziness with position change.  Will continue collaboration with cardiology & nephrology and current  medication regimen, adjust this as needed.  Recent labs done.  Recommend increased fluid intake at home, which she endorses she is not good about.  Return in 6 months.      Relevant Medications   benazepril (LOTENSIN) 20 MG tablet   atorvastatin (LIPITOR) 40 MG tablet     Endocrine   Hypothyroid    Chronic, ongoing.  Continue current medication regimen and adjust as needed based on labs.  Recent labs obtained and WNL.      Relevant Medications   levothyroxine (SYNTHROID) 100 MCG tablet     Other  Hyperlipidemia    Chronic, ongoing.  Continue current medication regimen and adjust as needed.  Recent labs performed.      Relevant Medications   benazepril (LOTENSIN) 20 MG tablet   atorvastatin (LIPITOR) 40 MG tablet   Anemia in chronic kidney disease    Chronic, ongoing.  Continue collaboration with nephrology, recent labs performed by them.      Relevant Medications   benazepril (LOTENSIN) 20 MG tablet   Multiple myeloma (HCC) - Primary    Chronic, progressive.  She refuses treatment.  Wishes to focus on quality vs quantity of life.  Is a DNR.  Recommend referral to either palliative or hospice level care, she wishes to think about these.  Both would offer more support to her locally.  Continue collaboration with oncology team.        Overweight (BMI 25.0-29.9)    Recommended eating smaller high protein, low fat meals more frequently and exercising 30 mins a day 5 times a week with a goal of 10-15lb weight loss in the next 3 months. Patient voiced their understanding and motivation to adhere to these recommendations.           Follow up plan: Return in about 6 months (around 04/14/2020) for Multiple Myeloma, HTN/HLD, Hypothyroid.

## 2019-10-14 NOTE — Patient Instructions (Signed)
Life Alert --- [phone]   DASH Eating Plan DASH stands for "Dietary Approaches to Stop Hypertension." The DASH eating plan is a healthy eating plan that has been shown to reduce high blood pressure (hypertension). It may also reduce your risk for type 2 diabetes, heart disease, and stroke. The DASH eating plan may also help with weight loss. What are tips for following this plan?  General guidelines  Avoid eating more than 2,300 mg (milligrams) of salt (sodium) a day. If you have hypertension, you may need to reduce your sodium intake to 1,500 mg a day.  Limit alcohol intake to no more than 1 drink a day for nonpregnant women and 2 drinks a day for men. One drink equals 12 oz of beer, 5 oz of wine, or 1 oz of hard liquor.  Work with your health care provider to maintain a healthy body weight or to lose weight. Ask what an ideal weight is for you.  Get at least 30 minutes of exercise that causes your heart to beat faster (aerobic exercise) most days of the week. Activities may include walking, swimming, or biking.  Work with your health care provider or diet and nutrition specialist (dietitian) to adjust your eating plan to your individual calorie needs. Reading food labels   Check food labels for the amount of sodium per serving. Choose foods with less than 5 percent of the Daily Value of sodium. Generally, foods with less than 300 mg of sodium per serving fit into this eating plan.  To find whole grains, look for the word "whole" as the first word in the ingredient list. Shopping  Buy products labeled as "low-sodium" or "no salt added."  Buy fresh foods. Avoid canned foods and premade or frozen meals. Cooking  Avoid adding salt when cooking. Use salt-free seasonings or herbs instead of table salt or sea salt. Check with your health care provider or pharmacist before using salt substitutes.  Do not fry foods. Cook foods using healthy methods such as baking, boiling, grilling,  and broiling instead.  Cook with heart-healthy oils, such as olive, canola, soybean, or sunflower oil. Meal planning  Eat a balanced diet that includes: ? 5 or more servings of fruits and vegetables each day. At each meal, try to fill half of your plate with fruits and vegetables. ? Up to 6-8 servings of whole grains each day. ? Less than 6 oz of lean meat, poultry, or fish each day. A 3-oz serving of meat is about the same size as a deck of cards. One egg equals 1 oz. ? 2 servings of low-fat dairy each day. ? A serving of nuts, seeds, or beans 5 times each week. ? Heart-healthy fats. Healthy fats called Omega-3 fatty acids are found in foods such as flaxseeds and coldwater fish, like sardines, salmon, and mackerel.  Limit how much you eat of the following: ? Canned or prepackaged foods. ? Food that is high in trans fat, such as fried foods. ? Food that is high in saturated fat, such as fatty meat. ? Sweets, desserts, sugary drinks, and other foods with added sugar. ? Full-fat dairy products.  Do not salt foods before eating.  Try to eat at least 2 vegetarian meals each week.  Eat more home-cooked food and less restaurant, buffet, and fast food.  When eating at a restaurant, ask that your food be prepared with less salt or no salt, if possible. What foods are recommended? The items listed may not be a complete  list. Talk with your dietitian about what dietary choices are best for you. Grains Whole-grain or whole-wheat bread. Whole-grain or whole-wheat pasta. Brown rice. [place]. Bulgur. Whole-grain and low-sodium cereals. Pita bread. Low-fat, low-sodium crackers. Whole-wheat flour tortillas. Vegetables Fresh or frozen vegetables (raw, steamed, roasted, or grilled). Low-sodium or reduced-sodium tomato and vegetable juice. Low-sodium or reduced-sodium tomato sauce and tomato paste. Low-sodium or reduced-sodium canned vegetables. Fruits All fresh, dried, or frozen fruit. Canned  fruit in natural juice (without added sugar). Meat and other protein foods Skinless chicken or [place]. Ground chicken or [place]. Pork with fat trimmed off. Fish and seafood. Egg whites. Dried beans, peas, or lentils. Unsalted nuts, nut butters, and seeds. Unsalted canned beans. Lean cuts of beef with fat trimmed off. Low-sodium, lean deli meat. Dairy Low-fat (1%) or fat-free (skim) milk. Fat-free, low-fat, or reduced-fat cheeses. Nonfat, low-sodium ricotta or cottage cheese. Low-fat or nonfat yogurt. Low-fat, low-sodium cheese. Fats and oils Soft margarine without trans fats. Vegetable oil. Low-fat, reduced-fat, or light mayonnaise and salad dressings (reduced-sodium). Canola, safflower, olive, soybean, and sunflower oils. Avocado. Seasoning and other foods Herbs. Spices. Seasoning mixes without salt. Unsalted popcorn and pretzels. Fat-free sweets. What foods are not recommended? The items listed may not be a complete list. Talk with your dietitian about what dietary choices are best for you. Grains Baked goods made with fat, such as croissants, muffins, or some breads. Dry pasta or rice meal packs. Vegetables Creamed or fried vegetables. Vegetables in a cheese sauce. Regular canned vegetables (not low-sodium or reduced-sodium). Regular canned tomato sauce and paste (not low-sodium or reduced-sodium). Regular tomato and vegetable juice (not low-sodium or reduced-sodium). Angie Fava. Olives. Fruits Canned fruit in a light or heavy syrup. Fried fruit. Fruit in cream or butter sauce. Meat and other protein foods Fatty cuts of meat. Ribs. Fried meat. Berniece Salines. Sausage. Bologna and other processed lunch meats. Salami. Fatback. Hotdogs. Bratwurst. Salted nuts and seeds. Canned beans with added salt. Canned or smoked fish. Whole eggs or egg yolks. Chicken or [place] with skin. Dairy Whole or 2% milk, cream, and half-and-half. Whole or full-fat cream cheese. Whole-fat or sweetened yogurt. Full-fat cheese.  Nondairy creamers. Whipped toppings. Processed cheese and cheese spreads. Fats and oils Butter. Stick margarine. Lard. Shortening. Ghee. Bacon fat. Tropical oils, such as coconut, palm kernel, or palm oil. Seasoning and other foods Salted popcorn and pretzels. Onion salt, garlic salt, seasoned salt, table salt, and sea salt. Worcestershire sauce. Tartar sauce. Barbecue sauce. Teriyaki sauce. Soy sauce, including reduced-sodium. Steak sauce. Canned and packaged gravies. Fish sauce. Oyster sauce. Cocktail sauce. Horseradish that you find on the shelf. Ketchup. Mustard. Meat flavorings and tenderizers. Bouillon cubes. Hot sauce and Tabasco sauce. Premade or packaged marinades. Premade or packaged taco seasonings. Relishes. Regular salad dressings. Where to find more information:  National Heart, Lung, and [place]: https://wilson-eaton.com/  American Heart Association: www.heart.org Summary  The DASH eating plan is a healthy eating plan that has been shown to reduce high blood pressure (hypertension). It may also reduce your risk for type 2 diabetes, heart disease, and stroke.  With the DASH eating plan, you should limit salt (sodium) intake to 2,300 mg a day. If you have hypertension, you may need to reduce your sodium intake to 1,500 mg a day.  When on the DASH eating plan, aim to eat more fresh fruits and vegetables, whole grains, lean proteins, low-fat dairy, and heart-healthy fats.  Work with your health care provider or diet and nutrition specialist (dietitian) to  adjust your eating plan to your individual calorie needs. This information is not intended to replace advice given to you by your health care provider. Make sure you discuss any questions you have with your health care provider. Document Revised: 03/24/2017 Document Reviewed: 04/04/2016 Elsevier Patient Education  2020 [employer].

## 2019-10-16 ENCOUNTER — Other Ambulatory Visit: Payer: Self-pay

## 2019-10-16 DIAGNOSIS — C9000 Multiple myeloma not having achieved remission: Secondary | ICD-10-CM

## 2019-10-16 DIAGNOSIS — D539 Nutritional anemia, unspecified: Secondary | ICD-10-CM

## 2019-10-17 ENCOUNTER — Other Ambulatory Visit: Payer: Self-pay

## 2019-10-17 NOTE — Progress Notes (Signed)
The patient report she has been having chest pain due to her doing to much and hurt her chest

## 2019-10-20 NOTE — Progress Notes (Signed)
[facility]  [address] [place], [place] [address] Phone: [phone]  Fax: [phone]   Clinic Day:  10/21/2019  Referring physician: Venita Lick, NP  Chief Complaint: Bailey Ayala is a 79 y.o. female with multiplemyelomawho is seen for a 6 week assessment.  HPI: The patient was last seen in the medical oncology clinic via telemedicine on 09/06/2019. At that time, she felt "great".  She was no longer using her walker. She was slowly getting back to performing her daily activities.  Shortness of breath and cough were "not good".  She declined treatment, but wanted to be followed in clinic every 6 weeks.   The patient saw Carnella Guadalajara, NP, on 09/24/2019 with complaints of chest pain. CXR showed no radiographic evidence of acute cardiopulmonary disease.   The patient saw Dr. Lucky Cowboy on 10/01/2019 for a follow up 4 weeks after right great saphenous vein laser ablation. Her leg was feeling less swollen and heavy. He recommended sclerotherapy.   Labs on 10/09/2019 revealed a hematocrit was 29.6, hemoglobin 9.7, MCV 103.9, platelets 257,000, WBC 4,800. Creatinine was 1.28.  During the interim, she has been "not good." Her energy level is not good and she reports episodes of vertigo. She reports that "her vein broke" and it took her an hour to stop bleeding. A month ago, she began having sternum pain while lifting a heavy container. She was put on Diclofenac, which helps for a little while. The pain radiates to the side and is worse when she lays down. Her cough, shortness of breath, and diarrhea are stable.   Past Medical History:  Diagnosis Date  . AAA (abdominal aortic aneurysm) ([place])    The largest aortic measurement is 4.0 cm on 05/31/19  . Allergy 1980  . Anemia 09-2017  . Arthritis   . Bleeding from varicose vein   . Blood dyscrasia    BLEEDS EASILY   . Blood transfusion without reported diagnosis 2011 or 12  . Cataract ?  . CHF  (congestive heart failure) ([place]) ?  . CKD (chronic kidney disease), stage III    Moderate  . Clotting disorder ([place]) ?  Marland Kitchen COPD (chronic obstructive pulmonary disease) ([place])   . Coronary artery disease   . Diabetes mellitus without complication ([place])    patient denies  . Diffuse cystic mastopathy   . Emphysema of lung ([place]) ?  Marland Kitchen Emphysema of lung ([place])   . Heartburn   . Hyperlipidemia   . Hypertension   . Hypothyroidism   . Incomplete left bundle branch block (LBBB) 06/22/2019   Noted on EKG  . L4-L5 disc bulge   . Multiple myeloma ([place])   . Myocardial infarction ([place]) 2019  . Osteoporosis, post-menopausal   . PAD (peripheral artery disease) ([place])   . Renal insufficiency   . Shortness of breath dyspnea    WITH EXERTION   . Stroke ([place])    TIA     2016     WAS FOUND ON SCAN ORDERED FROM NEUROL  . Thyroid disease   . TIA (transient ischemic attack) 2016   left hand weakness  . Ulnar nerve compression, left   . Vertigo 06/22/2019   doing much today 06/28/2019    Past Surgical History:  Procedure Laterality Date  . ABDOMINAL HYSTERECTOMY  1990  . BACK SURGERY  2011  . BREAST EXCISIONAL BIOPSY Left    2010?   Marland Kitchen BREAST SURGERY  1975   A lump removed left  breast  . CATARACT EXTRACTION, BILATERAL    . COLONOSCOPY  1998  . EYE SURGERY Bilateral 05/06/14  . JOINT REPLACEMENT     LT KNEE  . KNEE SURGERY Left 2012  . LEFT HEART CATH AND CORONARY ANGIOGRAPHY Left 11/19/2018   Procedure: LEFT HEART CATH AND CORONARY ANGIOGRAPHY;  Surgeon: Wellington Hampshire, MD;  Location: [facility];  Service: Cardiovascular;  Laterality: Left;  . OOPHORECTOMY  1990  . parathyroid transplant     2/3 THYROID REMOVED   (GO[place])  . SPINE SURGERY    . THYROID SURGERY    . TONSILLECTOMY    . TOTAL HIP ARTHROPLASTY Right 07/02/2019   Procedure: TOTAL HIP ARTHROPLASTY ANTERIOR APPROACH;  Surgeon: Paralee Cancel, MD;  Location: [facility];  Service: Orthopedics;  Laterality: Right;  70 mins  .  TOTAL SHOULDER ARTHROPLASTY Left 01/14/2016   Procedure: LEFT TOTAL SHOULDER ARTHROPLASTY;  Surgeon: Justice Britain, MD;  Location: [facility];  Service: Orthopedics;  Laterality: Left;  . TOTAL SHOULDER ARTHROPLASTY Right 11/03/2016   Procedure: RIGHT TOTAL SHOULDER ARTHROPLASTY;  Surgeon: Justice Britain, MD;  Location: [facility];  Service: Orthopedics;  Laterality: Right;  . ULNAR NERVE REPAIR     LEFT  . vein closure procedure Left 2010    Family History  Problem Relation Age of Onset  . Heart disease Mother        MI  . Arthritis Mother   . Stroke Mother   . Hyperlipidemia Mother   . Hypertension Mother   . Varicose Veins Mother   . Heart disease Father        MI  . Hyperlipidemia Father   . Arthritis Father   . Cancer Father   . COPD Father   . Hearing loss Father   . Hypertension Father   . Vision loss Father   . Diabetes Sister   . COPD Sister   . Heart disease Sister   . Hyperlipidemia Sister   . Hypertension Sister   . Hyperlipidemia Brother   . COPD Brother   . Diabetes Brother   . Heart disease Brother   . Hypertension Brother   . Arthritis Sister   . Hyperlipidemia Sister   . Hypertension Sister   . Vision loss Sister   . Varicose Veins Sister   . Arthritis Brother   . Varicose Veins Brother   . Arthritis Daughter   . Arthritis Daughter   . Diabetes Daughter   . Cancer Paternal Aunt   . COPD Sister   . Heart disease Sister   . Hyperlipidemia Sister   . Hypertension Sister   . Varicose Veins Sister   . Heart disease Maternal Uncle   . Heart disease Paternal Uncle     Social History:  reports that she quit smoking about 29 years ago. Her smoking use included cigarettes. She has a 75.00 pack-year smoking history. She has never used smokeless tobacco. She reports previous alcohol use. She reports that she does not use drugs. Patient is a former 3 pack per day smoker for 25 years (75 pack year). Patient lives in [place]. She is a former Electrical engineer. Patient denies  known exposures to radiation on toxins. The patient is accompanied by her daughter via iPad today.  Allergies:  Allergies  Allergen Reactions  . Oxycodone Other (See Comments)    Made her feel "shaky"  . Codeine Other (See Comments)    hallucinations    . Simvastatin Diarrhea and Nausea Only  Current Medications: Current Outpatient Medications  Medication Sig Dispense Refill  . atorvastatin (LIPITOR) 40 MG tablet Take 1 tablet (40 mg total) by mouth daily. 90 tablet 4  . benazepril (LOTENSIN) 20 MG tablet Take 1 tablet (20 mg total) by mouth daily. 90 tablet 4  . diclofenac Sodium (VOLTAREN) 1 % GEL Apply 2 g topically 4 (four) times daily. 50 g 0  . ferrous sulfate (FERROUSUL) 325 (65 FE) MG tablet Take 1 tablet (325 mg total) by mouth 3 (three) times daily with meals for 14 days. 42 tablet 0  . levothyroxine (SYNTHROID) 100 MCG tablet Take 1 tablet (100 mcg total) by mouth daily. 90 tablet 4  . meclizine (ANTIVERT) 25 MG tablet Take 1 tablet (25 mg total) by mouth 2 (two) times daily as needed for dizziness. 15 tablet 0  . metoprolol tartrate (LOPRESSOR) 50 MG tablet Take 1 tablet (50 mg total) by mouth 2 (two) times daily. 60 tablet 11  . Multiple Vitamin (MULTIVITAMIN WITH MINERALS) TABS tablet Take 1 tablet by mouth daily.    Marland Kitchen triamcinolone cream (KENALOG) 0.1 % Apply 1 application topically 2 (two) times daily. (Patient taking differently: Apply 1 application topically 2 (two) times daily as needed (skin irritation/rash.). ) 30 g 0   No current facility-administered medications for this visit.    Review of Systems  Constitutional: Positive for malaise/fatigue (low energy). Negative for chills, diaphoresis, fever and weight loss.       Doing "not good."  HENT: Negative for congestion, ear discharge, ear pain, hearing loss, nosebleeds, sinus pain, sore throat and tinnitus.   Eyes: Negative for blurred vision and double vision.  Respiratory: Positive for cough (chronic;  stable) and shortness of breath. Negative for hemoptysis and sputum production.        COPD  Cardiovascular: Positive for chest pain (sternum). Negative for palpitations and leg swelling.  Gastrointestinal: Positive for diarrhea (chronic). Negative for abdominal pain, blood in stool, constipation, heartburn, melena, nausea and vomiting.  Genitourinary: Negative for dysuria, frequency and urgency.  Musculoskeletal: Negative for back pain, falls, joint pain (s/p right hip replacement), myalgias and neck pain.  Skin: Negative for itching and rash.  Neurological: Negative for dizziness, tingling, sensory change, weakness and headaches.       Aneurysm, near syncope episode on 06/22/2019. Vertigo comes and goes.  Endo/Heme/Allergies: Negative for environmental allergies. Does not bruise/bleed easily.       Hypothyroidism - on levothyroxine.   Psychiatric/Behavioral: Negative for depression and memory loss. The patient is not nervous/anxious and does not have insomnia.   All other systems reviewed and are negative.  Performance status (ECOG): 1  Vitals Blood pressure (!) 142/60, pulse 78, temperature 97.7 F (36.5 C), temperature source Oral, weight 178 lb 3.9 oz (80.8 kg), SpO2 100 %.   Physical Exam Vitals and nursing note reviewed.  Constitutional:      General: She is not in acute distress.    Appearance: She is not diaphoretic.  HENT:     Head: Normocephalic and atraumatic.     Comments: Shoulder length brown hair.    Mouth/Throat:     Mouth: Mucous membranes are moist.     Pharynx: Oropharynx is clear.  Eyes:     General: No scleral icterus.    Extraocular Movements: Extraocular movements intact.     Conjunctiva/sclera: Conjunctivae normal.     Pupils: Pupils are equal, round, and reactive to light.     Comments: Blue eyes.  Cardiovascular:  Rate and Rhythm: Normal rate and regular rhythm.     Pulses: Normal pulses.     Heart sounds: Normal heart sounds. No murmur heard.    Pulmonary:     Effort: Pulmonary effort is normal. No respiratory distress.     Breath sounds: Normal breath sounds. No wheezing or rales.  Chest:     Chest wall: No tenderness.  Abdominal:     General: Bowel sounds are normal. There is no distension.     Palpations: Abdomen is soft. There is no mass.     Tenderness: There is no abdominal tenderness. There is no guarding or rebound.  Musculoskeletal:        General: Tenderness (sternum) present. No swelling. Normal range of motion.     Cervical back: Normal range of motion and neck supple.     Right lower leg: Edema (trace) present.     Left lower leg: Edema (trace) present.  Lymphadenopathy:     Head:     Right side of head: No preauricular, posterior auricular or occipital adenopathy.     Left side of head: No preauricular, posterior auricular or occipital adenopathy.     Cervical: No cervical adenopathy.     Upper Body:     Right upper body: No supraclavicular or axillary adenopathy.     Left upper body: No supraclavicular or axillary adenopathy.     Lower Body: No right inguinal adenopathy. No left inguinal adenopathy.  Skin:    General: Skin is warm and dry.  Neurological:     Mental Status: She is alert and oriented to person, place, and time. Mental status is at baseline.  Psychiatric:        Mood and Affect: Mood normal.        Behavior: Behavior normal.        Thought Content: Thought content normal.        Judgment: Judgment normal.     Appointment on 10/21/2019  Component Date Value Ref Range Status  . Sodium 10/21/2019 137  135 - 145 mmol/L Final  . Potassium 10/21/2019 3.8  3.5 - 5.1 mmol/L Final  . Chloride 10/21/2019 104  98 - 111 mmol/L Final  . CO2 10/21/2019 26  22 - 32 mmol/L Final  . Glucose, Bld 10/21/2019 94  70 - 99 mg/dL Final   Glucose reference range applies only to samples taken after fasting for at least 8 hours.  . BUN 10/21/2019 20  8 - 23 mg/dL Final  . Creatinine, Ser 10/21/2019 1.23*  0.44 - 1.00 mg/dL Final  . Calcium 10/21/2019 8.8* 8.9 - 10.3 mg/dL Final  . Total Protein 10/21/2019 8.4* 6.5 - 8.1 g/dL Final  . Albumin 10/21/2019 3.8  3.5 - 5.0 g/dL Final  . AST 10/21/2019 18  15 - 41 U/L Final  . ALT 10/21/2019 13  0 - 44 U/L Final  . Alkaline Phosphatase 10/21/2019 78  38 - 126 U/L Final  . Total Bilirubin 10/21/2019 0.4  0.3 - 1.2 mg/dL Final  . GFR calc non Af Amer 10/21/2019 42* >60 mL/min Final  . GFR calc Af Amer 10/21/2019 49* >60 mL/min Final  . Anion gap 10/21/2019 7  5 - 15 Final   Performed at [facility], [address] [place], [place] [address]  . WBC 10/21/2019 4.0  4.0 - 10.5 K/uL Final  . RBC 10/21/2019 2.51* 3.87 - 5.11 MIL/uL Final  . Hemoglobin 10/21/2019 8.5* 12.0 - 15.0 g/dL Final  .  HCT 10/21/2019 27.1* 36 - 46 % Final  . MCV 10/21/2019 108.0* 80.0 - 100.0 fL Final  . MCH 10/21/2019 33.9  26.0 - 34.0 pg Final  . MCHC 10/21/2019 31.4  30.0 - 36.0 g/dL Final  . RDW 10/21/2019 14.6  11.5 - 15.5 % Final  . Platelets 10/21/2019 229  150 - 400 K/uL Final  . nRBC 10/21/2019 0.0  0.0 - 0.2 % Final  . Neutrophils Relative % 10/21/2019 57  % Final  . Neutro Abs 10/21/2019 2.3  1.7 - 7.7 K/uL Final  . Lymphocytes Relative 10/21/2019 27  % Final  . Lymphs Abs 10/21/2019 1.1  0.7 - 4.0 K/uL Final  . Monocytes Relative 10/21/2019 11  % Final  . Monocytes Absolute 10/21/2019 0.5  0 - 1 K/uL Final  . Eosinophils Relative 10/21/2019 4  % Final  . Eosinophils Absolute 10/21/2019 0.2  0 - 0 K/uL Final  . Basophils Relative 10/21/2019 1  % Final  . Basophils Absolute 10/21/2019 0.0  0 - 0 K/uL Final  . Immature Granulocytes 10/21/2019 0  % Final  . Abs Immature Granulocytes 10/21/2019 0.01  0.00 - 0.07 K/uL Final   Performed at [facility], [address], [place], [place] [address]    Assessment:  SHANECE COCHRANE is a 79 y.o. female with smoldering multiple myeloma. SPEPon 06/21/2017 revealed a 1.2 gm/dL monoclonal  spike. Random urine revealed 4.6 mg/dL protein and a 40.6% M-spike. Calcium, albumin, and serum protein were normal.  Work-up on 03/08/2019revealed a hematocrit of 31.1, hemoglobin 10.4, MCV 97.4, platelets 227,000, WBC 5700 with an ANC of 3300. SPEPrevealed a 1.1 gm/dL IgG monoclonal protein with lambda light chain specificity and monoclonal free lambda light chains(Bence-Jones protein). IgG was 1660 ([phone]) and IgA was 48 (64-422). Kappa free light chains were 23.7, lambda free light chains were 142.2 and ratio 0.17 (0.26-1.65). Beta2-microglobulin was 4.7 (0.6-2.4). Normal studies included: B12, folate, and ferritin (75). TSHwas 0.204 (low). 24 hour UPEPrevealed kappa free light chains 28.80 mg/L, lambda free light chains 63.80 mg/L and ration 0.45 (2.04 - 10.37). M spike was 27.4% (16 mg/24 hours).  Bone surveyon 06/30/2017 revealed no suspicious focal bony lesions.  Bone marrow aspirate and biopsyon 07/26/2017 revealed a slightly hypercellular marrow for age with trilineage hematopoiesis. There was 13% plasmacytosis(10-15% byCD138IHC). Plasma cells were ininterstitial cells and small clusters. Lambda light chains appeared restricted. Features were felt compatible with plasma cell neoplasm.  Bone marrow aspirate and biopsy on 05/29/2019 revealed a normocellularmarrow for age (20-30%) withincreased monoclonal plasma cells (15% aspirate, 20-30% CD138by IHC).Plasma cells were lambda restricted.There were no myelodysplastic changes.Storage iron was present. Flow cytometry revealed no monoclonal B-cell or phenotypically aberrant T-cell population. Findings were consistent with persistent plasma cell myeloma.Peripheral bloodrevealed amacrocytic anemia with rouleaux formation.Cytogenetics were normal (59, XX). FISH for MDSrevealed gain of KMT2A (MLL) or chromosome 11/11q (6F.5.0%, normal <3.1%).FISH for myeloma revealed a deletion of NF1 or chromosome  17q11 (2R1G, 89%, not seen in validation studies).  PET scanon 08/14/2017 revealed no hypermetabolic lesions of myeloma. There was a 3.6 cm infrarenal aortic aneurysm. Ultrasound in 2 years was recommended. She had a 4 mm LLL noduletoo small to characterize.  SPEPhas been followed: 1.1 on 06/30/2017, 1.2 on 11/14/2017, 1.3 on 01/26/2018, 1.2 on 05/10/2018, 1.4 on 08/08/2018, 1.2 on 11/09/2018, 1.4 on 02/11/2019, 1.6 on 05/09/2019, 1.6 on 08/07/2019 and 1.8 on 10/21/2019. IgGhas been followed: 1660 on 06/30/2017, 1684 on 11/14/2017, 1795 on 01/26/2018, and 2054 on 08/08/2018.  Lambda free light chainshave been followed:142.2 (ratio 0.17)on03/11/2017, 214.4(ratio 0.09)on01/16/2020, 209.3(ratio 0.08)on04/14/2020, 220.2(ratio 0.07)on07/17/2020, 228.9(ratio 0.08)on 02/11/2019,258.4 (ratio 0.07)on01/14/2021, 341.6 (ratio 0.05) and 295.1 (ratio 0.05) on 10/21/2019.  Chest CTon 01/24/2018 revealed persistent LLL nodule. Area calcified and felt to be consistent with a granuloma. There were no other suspicious pulmonary nodules. There was new Schmorl's node formation involving the inferior endplate of F81. There were stable small scattered osseous lesions related to underlying multiple myeloma.  PET scanon 05/15/2018 revealed nohypermetabolic lesions characteristic of active myeloma. There was stable hypermetabolic apical wall of the left ventricle, there waspotentially a small pseudoaneurysmin this vicinity, but no appreciable mass was noted on 01/24/2018. There was aninfrarenal abdominal aortic aneurysm, 3.6 cm in diameter. Follow-up ultrasound in 2 years was recommended. Other imaging findings of potential clinical significance: aortic atherosclerosis and coronary atherosclerosis.  Bone survery on01/25/2021 showed numerous new lytic lesions throughout the skeleton including the posterior calvarium, bilateral humeri, bilateral iliac bones, left inferior pubic ramus,  bilateral femurs.  She has stage III chronic kidney disease(Cr 2.25 on 05/27/2019).   She has a macrocytic anemia. B12, folate, and TSH were normal on 05/10/2018. Retic was 1.6%. Ferritin and iron studies were normal on 05/10/2018. Appetiteremains poor. Last colonoscopywas >10 years ago (she declines repeat).  She has a 75 pack year smoking history. She has COPD. Chest CT without contraston 07/25/2017 revealed no suspicious pulmonary nodules.  Sheunderwent total right hip replacementon 07/02/2019. She required 2 units of PRBCs.   Symptomatically, her energy level is low.  She describes an hour long episode of bleeding from a leg vein.  Exam is stable.  Plan: 1.   Labs today:  CBC with diff, CMP, SPEP, FLCA.  2.Multiple myeloma Symptomatically,she is fatigued. M spikewas 1.6 gm/dL today (increased). Lambda free light chains295(ratio 0.05). Bone surveyon 05/20/2019 revealednumerous new lytic lesions throughout the skeleton. Bone marrow aspirateon 05/29/2019 revealed anormocellularmarrow for age (20-30%) withincreased monoclonal plasma cells (20-30% CD138IHC). Cytogenetics were normal (32, XX).  FISH for myeloma revealed a deletion of NF1 or chromosome 17q11 (2R1G, 89%, not seen in validation studies). Marrow plasmacytosis has increased from 10-15% to 20-30%since 07/2017. DiscussCRAB criteria. Creatinine 1.23, anemia (Hgb 8.5),and lytic bone lesions.  Calcium remains normal. Patient has sternal pain which may resent represent bone disease   Discuss consideration of a PET scan.    Consider RVD lite (35 day cycle).  Revlimid 15 mg po q day on days 1-21.   Begin with Revlimid 5 or 10 mg a day based on patient's age.  Velcade 1.3 mg/m2 SQ on days 1,8,15,22.  Dexamethasone 20 mg weekly on day of or after Velcade x 9 cycles then 6 cycles of consolidation.             Prophylaxis: thromboembolic, herpes  (including zoster)             Potential side effects:  fatigue (78%), neutropenia (14%), rash (10%), neuropathy (62%).             Response rates 86%.  Time to response 1.1 months.  PFS 41.9 months. Consider Dara-Rd based on the phase III MAIA trial (28 day cycle): Patient not currently interested in treatment.  She wishes to be seen for assessment monthly.                      3. Sternal pain PET scan on 01/21/2020revealed no active myeloma. Bone survey revealed multiple lytic lesions. Concern remains for sternal pain that may represent a bony  lesion Discuss consideration of a PET scan. 4. Renal insufficieny Creatinine 1.23today. Consider Retacrit as needed for anemia due to renal disease. 5. Macrocytic anemia Hematocrit 27.1. Hemoglobin 8.5. MCV 108.0 today. B12was 911 and folate 60.9. on 05/16/2019. Shehas no liver disease. Bone marrow on 02/03/2021revealed no evidence of MDS.  Anemia felt worse secondary to recent hemorrhage (see below). 6.Vascular issues in feet Patient notes recent significant blood loss secondary to fragile blood vessels in feet. Patient to follow-up with vascular surgery. 7.   Patient to call if sternal pain persists for a PET scan. 8.   RTC in 1 month for MD assessment, labs (CBC with diff, CMP, SPEP, retic, ferritin, iron studies).  I discussed the assessment and treatment plan with the patient.  The patient was provided an opportunity to ask questions and all were answered.  The patient agreed with the plan and demonstrated an understanding of the instructions.  The patient was advised to call back if the symptoms worsen or if the condition fails to improve as anticipated.   Lequita Asal, MD, PhD    10/21/2019, 1:55 PM  I, Mirian Mo Tufford, am acting as Education administrator for [employer]. Mike Gip, MD, PhD.  I, Rayleigh Gillyard C. Mike Gip, MD, have reviewed  the above documentation for accuracy and completeness, and I agree with the above.

## 2019-10-21 ENCOUNTER — Inpatient Hospital Stay (HOSPITAL_BASED_OUTPATIENT_CLINIC_OR_DEPARTMENT_OTHER): Payer: Medicare Other | Admitting: Hematology and Oncology

## 2019-10-21 ENCOUNTER — Inpatient Hospital Stay: Payer: Medicare Other | Attending: Hematology and Oncology

## 2019-10-21 ENCOUNTER — Other Ambulatory Visit: Payer: Self-pay

## 2019-10-21 ENCOUNTER — Encounter: Payer: Self-pay | Admitting: Hematology and Oncology

## 2019-10-21 VITALS — BP 142/60 | HR 78 | Temp 97.7°F | Wt 178.2 lb

## 2019-10-21 DIAGNOSIS — N183 Chronic kidney disease, stage 3 unspecified: Secondary | ICD-10-CM | POA: Diagnosis not present

## 2019-10-21 DIAGNOSIS — E1151 Type 2 diabetes mellitus with diabetic peripheral angiopathy without gangrene: Secondary | ICD-10-CM | POA: Diagnosis not present

## 2019-10-21 DIAGNOSIS — E039 Hypothyroidism, unspecified: Secondary | ICD-10-CM | POA: Diagnosis not present

## 2019-10-21 DIAGNOSIS — C9000 Multiple myeloma not having achieved remission: Secondary | ICD-10-CM | POA: Diagnosis not present

## 2019-10-21 DIAGNOSIS — I130 Hypertensive heart and chronic kidney disease with heart failure and stage 1 through stage 4 chronic kidney disease, or unspecified chronic kidney disease: Secondary | ICD-10-CM | POA: Diagnosis not present

## 2019-10-21 DIAGNOSIS — R0789 Other chest pain: Secondary | ICD-10-CM

## 2019-10-21 DIAGNOSIS — Z87891 Personal history of nicotine dependence: Secondary | ICD-10-CM | POA: Insufficient documentation

## 2019-10-21 DIAGNOSIS — Z791 Long term (current) use of non-steroidal anti-inflammatories (NSAID): Secondary | ICD-10-CM | POA: Insufficient documentation

## 2019-10-21 DIAGNOSIS — Z7189 Other specified counseling: Secondary | ICD-10-CM | POA: Diagnosis not present

## 2019-10-21 DIAGNOSIS — I2510 Atherosclerotic heart disease of native coronary artery without angina pectoris: Secondary | ICD-10-CM | POA: Diagnosis not present

## 2019-10-21 DIAGNOSIS — M810 Age-related osteoporosis without current pathological fracture: Secondary | ICD-10-CM | POA: Diagnosis not present

## 2019-10-21 DIAGNOSIS — N1832 Chronic kidney disease, stage 3b: Secondary | ICD-10-CM

## 2019-10-21 DIAGNOSIS — I714 Abdominal aortic aneurysm, without rupture: Secondary | ICD-10-CM | POA: Insufficient documentation

## 2019-10-21 DIAGNOSIS — E785 Hyperlipidemia, unspecified: Secondary | ICD-10-CM | POA: Insufficient documentation

## 2019-10-21 DIAGNOSIS — Z8673 Personal history of transient ischemic attack (TIA), and cerebral infarction without residual deficits: Secondary | ICD-10-CM | POA: Insufficient documentation

## 2019-10-21 DIAGNOSIS — I779 Disorder of arteries and arterioles, unspecified: Secondary | ICD-10-CM

## 2019-10-21 DIAGNOSIS — J449 Chronic obstructive pulmonary disease, unspecified: Secondary | ICD-10-CM | POA: Insufficient documentation

## 2019-10-21 DIAGNOSIS — I509 Heart failure, unspecified: Secondary | ICD-10-CM | POA: Insufficient documentation

## 2019-10-21 DIAGNOSIS — E1122 Type 2 diabetes mellitus with diabetic chronic kidney disease: Secondary | ICD-10-CM | POA: Insufficient documentation

## 2019-10-21 DIAGNOSIS — R072 Precordial pain: Secondary | ICD-10-CM | POA: Diagnosis not present

## 2019-10-21 DIAGNOSIS — D539 Nutritional anemia, unspecified: Secondary | ICD-10-CM

## 2019-10-21 DIAGNOSIS — E1136 Type 2 diabetes mellitus with diabetic cataract: Secondary | ICD-10-CM | POA: Diagnosis not present

## 2019-10-21 DIAGNOSIS — M1990 Unspecified osteoarthritis, unspecified site: Secondary | ICD-10-CM | POA: Insufficient documentation

## 2019-10-21 DIAGNOSIS — I252 Old myocardial infarction: Secondary | ICD-10-CM | POA: Insufficient documentation

## 2019-10-21 DIAGNOSIS — D631 Anemia in chronic kidney disease: Secondary | ICD-10-CM

## 2019-10-21 DIAGNOSIS — Z79899 Other long term (current) drug therapy: Secondary | ICD-10-CM | POA: Diagnosis not present

## 2019-10-21 LAB — CBC WITH DIFFERENTIAL/PLATELET
Abs Immature Granulocytes: 0.01 10*3/uL (ref 0.00–0.07)
Basophils Absolute: 0 10*3/uL (ref 0.0–0.1)
Basophils Relative: 1 %
Eosinophils Absolute: 0.2 10*3/uL (ref 0.0–0.5)
Eosinophils Relative: 4 %
HCT: 27.1 % — ABNORMAL LOW (ref 36.0–46.0)
Hemoglobin: 8.5 g/dL — ABNORMAL LOW (ref 12.0–15.0)
Immature Granulocytes: 0 %
Lymphocytes Relative: 27 %
Lymphs Abs: 1.1 10*3/uL (ref 0.7–4.0)
MCH: 33.9 pg (ref 26.0–34.0)
MCHC: 31.4 g/dL (ref 30.0–36.0)
MCV: 108 fL — ABNORMAL HIGH (ref 80.0–100.0)
Monocytes Absolute: 0.5 10*3/uL (ref 0.1–1.0)
Monocytes Relative: 11 %
Neutro Abs: 2.3 10*3/uL (ref 1.7–7.7)
Neutrophils Relative %: 57 %
Platelets: 229 10*3/uL (ref 150–400)
RBC: 2.51 MIL/uL — ABNORMAL LOW (ref 3.87–5.11)
RDW: 14.6 % (ref 11.5–15.5)
WBC: 4 10*3/uL (ref 4.0–10.5)
nRBC: 0 % (ref 0.0–0.2)

## 2019-10-21 LAB — COMPREHENSIVE METABOLIC PANEL
ALT: 13 U/L (ref 0–44)
AST: 18 U/L (ref 15–41)
Albumin: 3.8 g/dL (ref 3.5–5.0)
Alkaline Phosphatase: 78 U/L (ref 38–126)
Anion gap: 7 (ref 5–15)
BUN: 20 mg/dL (ref 8–23)
CO2: 26 mmol/L (ref 22–32)
Calcium: 8.8 mg/dL — ABNORMAL LOW (ref 8.9–10.3)
Chloride: 104 mmol/L (ref 98–111)
Creatinine, Ser: 1.23 mg/dL — ABNORMAL HIGH (ref 0.44–1.00)
GFR calc Af Amer: 49 mL/min — ABNORMAL LOW (ref >60)
GFR calc non Af Amer: 42 mL/min — ABNORMAL LOW (ref >60)
Glucose, Bld: 94 mg/dL (ref 70–99)
Potassium: 3.8 mmol/L (ref 3.5–5.1)
Sodium: 137 mmol/L (ref 135–145)
Total Bilirubin: 0.4 mg/dL (ref 0.3–1.2)
Total Protein: 8.4 g/dL — ABNORMAL HIGH (ref 6.5–8.1)

## 2019-10-22 LAB — PROTEIN ELECTROPHORESIS, SERUM
A/G Ratio: 0.9 (ref 0.7–1.7)
Albumin ELP: 3.7 g/dL (ref 2.9–4.4)
Alpha-1-Globulin: 0.3 g/dL (ref 0.0–0.4)
Alpha-2-Globulin: 1 g/dL (ref 0.4–1.0)
Beta Globulin: 0.8 g/dL (ref 0.7–1.3)
Gamma Globulin: 2 g/dL — ABNORMAL HIGH (ref 0.4–1.8)
Globulin, Total: 4.1 g/dL — ABNORMAL HIGH (ref 2.2–3.9)
M-Spike, %: 1.8 g/dL — ABNORMAL HIGH
Total Protein ELP: 7.8 g/dL (ref 6.0–8.5)

## 2019-10-22 LAB — KAPPA/LAMBDA LIGHT CHAINS
Kappa free light chain: 14.8 mg/L (ref 3.3–19.4)
Kappa, lambda light chain ratio: 0.05 — ABNORMAL LOW (ref 0.26–1.65)
Lambda free light chains: 295.1 mg/L — ABNORMAL HIGH (ref 5.7–26.3)

## 2019-10-23 ENCOUNTER — Telehealth: Payer: Self-pay

## 2019-10-23 NOTE — Telephone Encounter (Signed)
Patient aware.

## 2019-10-23 NOTE — Telephone Encounter (Signed)
-----   Message from Lequita Asal, MD sent at 10/23/2019  4:35 PM EDT ----- Regarding: RE: Please call patient with SPEP results  Just an update.  M-spike has gone up a little 1.6 to 1.8 gm/dL.   M ----- Message ----- From: Drue Dun, RN Sent: 10/23/2019   4:05 PM EDT To: Lequita Asal, MD, Vito Berger, CMA Subject: RE: Please call patient with SPEP results      Sorry, What exactly should I tell her?  ----- Message ----- From: Lequita Asal, MD Sent: 10/23/2019   6:21 AM EDT To: Vito Berger, CMA, Shamra Bradeen Eusebio Me, RN Subject: Please call patient with SPEP results           ----- Message ----- From: Buel Ream, Lab In [facility] Sent: 10/21/2019   1:26 PM EDT To: Lequita Asal, MD

## 2019-10-27 DIAGNOSIS — R0789 Other chest pain: Secondary | ICD-10-CM | POA: Insufficient documentation

## 2019-10-30 ENCOUNTER — Encounter (INDEPENDENT_AMBULATORY_CARE_PROVIDER_SITE_OTHER): Payer: Self-pay | Admitting: Vascular Surgery

## 2019-10-30 ENCOUNTER — Other Ambulatory Visit: Payer: Self-pay

## 2019-10-30 ENCOUNTER — Ambulatory Visit (INDEPENDENT_AMBULATORY_CARE_PROVIDER_SITE_OTHER): Payer: Medicare Other | Admitting: Vascular Surgery

## 2019-10-30 ENCOUNTER — Other Ambulatory Visit: Payer: Self-pay | Admitting: Hematology and Oncology

## 2019-10-30 DIAGNOSIS — C9000 Multiple myeloma not having achieved remission: Secondary | ICD-10-CM

## 2019-10-30 DIAGNOSIS — I83891 Varicose veins of right lower extremities with other complications: Secondary | ICD-10-CM | POA: Diagnosis not present

## 2019-10-30 NOTE — Progress Notes (Signed)
Varicose veins of right  lower extremity with inflammation (454.1  I83.10) Current Plans   Indication: Patient presents with symptomatic varicose veins of the right  lower extremity.   Procedure: Sclerotherapy using hypertonic saline mixed with 1% Lidocaine was performed on the right lower extremity. Compression wraps were placed. The patient tolerated the procedure well. 

## 2019-11-05 ENCOUNTER — Ambulatory Visit (INDEPENDENT_AMBULATORY_CARE_PROVIDER_SITE_OTHER): Payer: Medicare Other | Admitting: Cardiovascular Disease

## 2019-11-05 ENCOUNTER — Encounter: Payer: Self-pay | Admitting: Cardiovascular Disease

## 2019-11-05 ENCOUNTER — Other Ambulatory Visit: Payer: Self-pay

## 2019-11-05 VITALS — BP 150/80 | HR 65 | Ht 65.5 in | Wt 174.0 lb

## 2019-11-05 DIAGNOSIS — I714 Abdominal aortic aneurysm, without rupture, unspecified: Secondary | ICD-10-CM

## 2019-11-05 DIAGNOSIS — I739 Peripheral vascular disease, unspecified: Secondary | ICD-10-CM

## 2019-11-05 DIAGNOSIS — I2510 Atherosclerotic heart disease of native coronary artery without angina pectoris: Secondary | ICD-10-CM | POA: Diagnosis not present

## 2019-11-05 DIAGNOSIS — I779 Disorder of arteries and arterioles, unspecified: Secondary | ICD-10-CM

## 2019-11-05 DIAGNOSIS — I10 Essential (primary) hypertension: Secondary | ICD-10-CM

## 2019-11-05 NOTE — Progress Notes (Signed)
Cardiology Office Note   Date:  11/05/2019   ID:  Bailey Ayala, DOB 06-15-1940, MRN [id number]  PCP:  Venita Lick, NP  Cardiologist:   Kathlyn Sacramento, MD   Chief Complaint  Patient presents with  . office visit    6 month F/U; Meds verbally reviewed with patient.      History of Present Illness: Bailey Ayala is a 79 y.o. female who presents for a follow-up visit regarding coronary artery disease and peripheral arterial disease. She has chronic medical conditions that include peripheral arterial disease with intermittent claudication, previous tobacco use, hyperlipidemia, COPD and hypertension. The patient has known history of bilateral calf claudication due to SFA disease. ABI in 2017 was 0.57 on the right and 0.49 on the left. Duplex showed occlusion of the proximal to mid left SFA and borderline significant right SFA disease. The patient developed a rash with cilostazol which was discontinued. Repeat vascular studies in 2019 showed improvement in ABIs to 0.93 on the right and 0.64 on the left. Echocardiogram in October 2019 showed normal LV systolic function. She had cardiac catheterization in July 2020 as part of preoperative evaluation before hip surgery.  It was done after an abnormal Lexiscan Myoview and showed chronically occluded mid LAD with left to left collaterals, moderate RCA disease and mild left circumflex disease.  The coronary arteries were noted to be moderately to severely calcified.  EF was 45 to 50% with mildly elevated left ventricular end-diastolic pressure.  The patient was treated medically and metoprolol was added.  She was subsequently seen for continued mild chest discomfort and palpitations.  She underwent an outpatient monitor which showed frequent short runs of SVT.  Thus, metoprolol was increased to 50 mg twice daily with subsequent improvement in symptoms.    The patient had bleeding from varicose veins in her legs and underwent laser  ablation by Dr. Lucky Cowboy.  Multiple myeloma seems to be getting worse but the patient was very concerned about side effects of treatment and she declined treatment of multiple myeloma.  She is having sternal pain and she is going to have a PET scan tomorrow.  She is suspected of having lytic lesions.  Past Medical History:  Diagnosis Date  . AAA (abdominal aortic aneurysm) ([place])    The largest aortic measurement is 4.0 cm on 05/31/19  . Allergy 1980  . Anemia 09-2017  . Arthritis   . Bleeding from varicose vein   . Blood dyscrasia    BLEEDS EASILY   . Blood transfusion without reported diagnosis 2011 or 12  . Cataract ?  . CHF (congestive heart failure) ([place]) ?  . CKD (chronic kidney disease), stage III    Moderate  . Clotting disorder ([place]) ?  Marland Kitchen COPD (chronic obstructive pulmonary disease) ([place])   . Coronary artery disease   . Diabetes mellitus without complication ([place])    patient denies  . Diffuse cystic mastopathy   . Emphysema of lung ([place]) ?  Marland Kitchen Emphysema of lung ([place])   . Heartburn   . Hyperlipidemia   . Hypertension   . Hypothyroidism   . Incomplete left bundle branch block (LBBB) 06/22/2019   Noted on EKG  . L4-L5 disc bulge   . Multiple myeloma ([place])   . Myocardial infarction ([place]) 2019  . Osteoporosis, post-menopausal   . PAD (peripheral artery disease) ([place])   . Renal insufficiency   . Shortness of breath dyspnea    WITH EXERTION   .  Stroke ([place])    TIA     2016     WAS FOUND ON SCAN ORDERED FROM [facility]UR[facility]  . Thyroid disease   . TIA (transient ischemic attack) 2016   left hand weakness  . Ulnar nerve compression, left   . Vertigo 06/22/2019   doing much today 06/28/2019    Past Surgical History:  Procedure Laterality Date  . ABDOMINAL HYSTERECTOMY  1990  . BACK SURGERY  2011  . BREAST EXCISIONAL BIOPSY Left    2010?   Marland Kitchen BREAST SURGERY  1975   A lump removed left breast  . CATARACT EXTRACTION, BILATERAL    . COLONOSCOPY  1998  . ENDOSCOPIC VEIN LASER TREATMENT     . EYE SURGERY Bilateral 05/06/14  . JOINT REPLACEMENT     LT KNEE  . KNEE SURGERY Left 2012  . LEFT HEART CATH AND CORONARY ANGIOGRAPHY Left 11/19/2018   Procedure: LEFT HEART CATH AND CORONARY ANGIOGRAPHY;  Surgeon: Wellington Hampshire, MD;  Location: [facility];  Service: Cardiovascular;  Laterality: Left;  . OOPHORECTOMY  1990  . parathyroid transplant     2/3 THYROID REMOVED   ([facility])  . SPINE SURGERY    . THYROID SURGERY    . TONSILLECTOMY    . TOTAL HIP ARTHROPLASTY Right 07/02/2019   Procedure: TOTAL HIP ARTHROPLASTY ANTERIOR APPROACH;  Surgeon: Paralee Cancel, MD;  Location: [facility];  Service: Orthopedics;  Laterality: Right;  70 mins  . TOTAL SHOULDER ARTHROPLASTY Left 01/14/2016   Procedure: LEFT TOTAL SHOULDER ARTHROPLASTY;  Surgeon: Justice Britain, MD;  Location: [facility];  Service: Orthopedics;  Laterality: Left;  . TOTAL SHOULDER ARTHROPLASTY Right 11/03/2016   Procedure: RIGHT TOTAL SHOULDER ARTHROPLASTY;  Surgeon: Justice Britain, MD;  Location: [facility];  Service: Orthopedics;  Laterality: Right;  . ULNAR NERVE REPAIR     LEFT  . vein closure procedure Left 2010     Current Outpatient Medications  Medication Sig Dispense Refill  . atorvastatin (LIPITOR) 40 MG tablet Take 1 tablet (40 mg total) by mouth daily. 90 tablet 4  . benazepril (LOTENSIN) 20 MG tablet Take 1 tablet (20 mg total) by mouth daily. 90 tablet 4  . diclofenac Sodium (VOLTAREN) 1 % GEL Apply topically as needed.    . ferrous sulfate 325 (65 FE) MG tablet Take 325 mg by mouth daily with breakfast.    . levothyroxine (SYNTHROID) 100 MCG tablet Take 1 tablet (100 mcg total) by mouth daily. 90 tablet 4  . meclizine (ANTIVERT) 25 MG tablet Take 1 tablet (25 mg total) by mouth 2 (two) times daily as needed for dizziness. 15 tablet 0  . metoprolol tartrate (LOPRESSOR) 50 MG tablet Take 1 tablet (50 mg total) by mouth 2 (two) times daily. 60 tablet 11  . Multiple Vitamin (MULTIVITAMIN WITH MINERALS) TABS tablet  Take 1 tablet by mouth daily.    . diclofenac Sodium (VOLTAREN) 1 % GEL Apply 2 g topically 4 (four) times daily. 50 g 0  . ferrous sulfate (FERROUSUL) 325 (65 FE) MG tablet Take 1 tablet (325 mg total) by mouth 3 (three) times daily with meals for 14 days. (Patient taking differently: Take 325 mg by mouth daily with breakfast. ) 42 tablet 0  . triamcinolone cream (KENALOG) 0.1 % Apply 1 application topically 2 (two) times daily. 30 g 0   No current facility-administered medications for this visit.    Allergies:   Oxycodone, Codeine, and Simvastatin    Social History:  The  patient  reports that she quit smoking about 30 years ago. Her smoking use included cigarettes. She has a 75.00 pack-year smoking history. She has never used smokeless tobacco. She reports previous alcohol use. She reports that she does not use drugs.   Family History:  The patient's family history includes Arthritis in her brother, daughter, daughter, father, mother, and sister; COPD in her brother, father, sister, and sister; Cancer in her father and paternal aunt; Diabetes in her brother, daughter, and sister; Hearing loss in her father; Heart disease in her brother, father, maternal uncle, mother, paternal uncle, sister, and sister; Hyperlipidemia in her brother, father, mother, sister, sister, and sister; Hypertension in her brother, father, mother, sister, sister, and sister; Stroke in her mother; Varicose Veins in her brother, mother, sister, and sister; Vision loss in her father and sister.    ROS:  Please see the history of present illness.   Otherwise, review of systems are positive for none.   All other systems are reviewed and negative.    PHYSICAL EXAM: VS:  BP (!) 150/80 (BP Location: Left Arm, Patient Position: Sitting, Cuff Size: Normal)   Pulse 65   Ht 5' 5.5" (1.664 m)   Wt 174 lb (78.9 kg)   SpO2 92%   BMI 28.51 kg/m  , BMI Body mass index is 28.51 kg/m. GEN: Well nourished, well developed, in no  acute distress  HEENT: normal  Neck: no JVD, carotid bruits, or masses Cardiac: RRR; no murmurs, rubs, or gallops,no edema .  Significant tenderness at the sternal Respiratory:  clear to auscultation bilaterally, normal work of breathing GI: soft, nontender, nondistended, + BS MS: no deformity or atrophy  Skin: warm and dry, no rash Neuro:  Strength and sensation are intact Psych: euthymic mood, full affect   EKG:  EKG is ordered today. The ekg ordered today demonstrates normal sinus rhythm with PACs and minimal LVH.  Recent Labs: 08/30/2019: TSH 0.693 10/21/2019: ALT 13; BUN 20; Creatinine, Ser 1.23; Hemoglobin 8.5; Platelets 229; Potassium 3.8; Sodium 137    Lipid Panel    Component Value Date/Time   CHOL 144 01/29/2019 1116   CHOL 152 11/30/2018 0946   CHOL 157 12/28/2017 1451   TRIG 217 (H) 01/29/2019 1116   TRIG 230 (H) 12/28/2017 1451   HDL 40 (L) 01/29/2019 1116   HDL 40 11/30/2018 0946   CHOLHDL 3.6 01/29/2019 1116   VLDL 43 (H) 01/29/2019 1116   VLDL 46 (H) 12/28/2017 1451   LDLCALC 61 01/29/2019 1116   LDLCALC 79 11/30/2018 0946      Wt Readings from Last 3 Encounters:  11/05/19 174 lb (78.9 kg)  10/30/19 171 lb (77.6 kg)  10/21/19 178 lb 3.9 oz (80.8 kg)       No flowsheet data found.    ASSESSMENT AND PLAN:  1.  Coronary artery disease involving native coronary arteries with stable angina: Known occluded LAD with collaterals.  Continue medical therapy.  Continue metoprolol.  2. Peripheral arterial disease with moderate bilateral calf claudication worse on the left side:     Stable symptoms overall.  Continue medical therapy.  3.  Hyperlipidemia: Continue treatment with atorvastatin. Most recent lipid profile in  July showed an LDL of 64. Triglyceride was mildly elevated at 262.   4. Essential hypertension: Blood pressure is mildly elevated.  Consider adding amlodipine.  5.  Small abdominal aortic aneurysm: Stable on most recent ultrasound.  6.   Multiple myeloma with anemia and chronic kidney disease.  The patient is very emotional about this and so far has declined treatment for this.      Disposition:   FU with me in 6 months  Signed,  Kathlyn Sacramento, MD  11/05/2019 4:12 PM    [place]

## 2019-11-05 NOTE — Patient Instructions (Signed)

## 2019-11-06 ENCOUNTER — Ambulatory Visit
Admission: RE | Admit: 2019-11-06 | Discharge: 2019-11-06 | Disposition: A | Discharge status: Home / Self Care | Payer: Medicare Other | Source: Ambulatory Visit | Attending: Hematology and Oncology | Admitting: Hematology and Oncology

## 2019-11-06 DIAGNOSIS — I714 Abdominal aortic aneurysm, without rupture: Secondary | ICD-10-CM | POA: Insufficient documentation

## 2019-11-06 DIAGNOSIS — C9000 Multiple myeloma not having achieved remission: Secondary | ICD-10-CM | POA: Diagnosis not present

## 2019-11-06 DIAGNOSIS — I700 Atherosclerosis of aorta: Secondary | ICD-10-CM | POA: Diagnosis not present

## 2019-11-06 DIAGNOSIS — I2510 Atherosclerotic heart disease of native coronary artery without angina pectoris: Secondary | ICD-10-CM | POA: Diagnosis not present

## 2019-11-06 LAB — GLUCOSE, CAPILLARY: Glucose-Capillary: 82 mg/dL (ref 70–99)

## 2019-11-06 MED ORDER — FLUDEOXYGLUCOSE F - 18 (FDG) INJECTION
8.8400 | Freq: Once | INTRAVENOUS | Status: AC | PRN
  Administered 2019-11-06: 8.84 via INTRAVENOUS

## 2019-11-12 ENCOUNTER — Other Ambulatory Visit: Payer: Self-pay

## 2019-11-12 ENCOUNTER — Encounter: Payer: Self-pay | Admitting: Hematology and Oncology

## 2019-11-13 ENCOUNTER — Encounter: Payer: Self-pay | Admitting: Hematology and Oncology

## 2019-11-13 ENCOUNTER — Inpatient Hospital Stay (HOSPITAL_BASED_OUTPATIENT_CLINIC_OR_DEPARTMENT_OTHER): Payer: Medicare Other | Admitting: Hematology and Oncology

## 2019-11-13 ENCOUNTER — Inpatient Hospital Stay: Payer: Medicare Other | Attending: Hematology and Oncology

## 2019-11-13 VITALS — BP 139/66 | HR 71 | Temp 97.9°F | Wt 174.9 lb

## 2019-11-13 DIAGNOSIS — C9000 Multiple myeloma not having achieved remission: Secondary | ICD-10-CM | POA: Diagnosis not present

## 2019-11-13 DIAGNOSIS — I252 Old myocardial infarction: Secondary | ICD-10-CM | POA: Insufficient documentation

## 2019-11-13 DIAGNOSIS — Z7189 Other specified counseling: Secondary | ICD-10-CM

## 2019-11-13 DIAGNOSIS — Z8673 Personal history of transient ischemic attack (TIA), and cerebral infarction without residual deficits: Secondary | ICD-10-CM | POA: Insufficient documentation

## 2019-11-13 DIAGNOSIS — R079 Chest pain, unspecified: Secondary | ICD-10-CM | POA: Diagnosis not present

## 2019-11-13 DIAGNOSIS — Z79899 Other long term (current) drug therapy: Secondary | ICD-10-CM | POA: Insufficient documentation

## 2019-11-13 DIAGNOSIS — M1990 Unspecified osteoarthritis, unspecified site: Secondary | ICD-10-CM | POA: Diagnosis not present

## 2019-11-13 DIAGNOSIS — I2510 Atherosclerotic heart disease of native coronary artery without angina pectoris: Secondary | ICD-10-CM | POA: Diagnosis not present

## 2019-11-13 DIAGNOSIS — I509 Heart failure, unspecified: Secondary | ICD-10-CM | POA: Diagnosis not present

## 2019-11-13 DIAGNOSIS — D539 Nutritional anemia, unspecified: Secondary | ICD-10-CM | POA: Diagnosis not present

## 2019-11-13 DIAGNOSIS — J449 Chronic obstructive pulmonary disease, unspecified: Secondary | ICD-10-CM | POA: Insufficient documentation

## 2019-11-13 DIAGNOSIS — I739 Peripheral vascular disease, unspecified: Secondary | ICD-10-CM | POA: Insufficient documentation

## 2019-11-13 DIAGNOSIS — C7951 Secondary malignant neoplasm of bone: Secondary | ICD-10-CM | POA: Diagnosis not present

## 2019-11-13 DIAGNOSIS — N183 Chronic kidney disease, stage 3 unspecified: Secondary | ICD-10-CM | POA: Diagnosis not present

## 2019-11-13 DIAGNOSIS — N289 Disorder of kidney and ureter, unspecified: Secondary | ICD-10-CM | POA: Diagnosis not present

## 2019-11-13 DIAGNOSIS — R5383 Other fatigue: Secondary | ICD-10-CM | POA: Diagnosis not present

## 2019-11-13 DIAGNOSIS — E785 Hyperlipidemia, unspecified: Secondary | ICD-10-CM | POA: Diagnosis not present

## 2019-11-13 DIAGNOSIS — I779 Disorder of arteries and arterioles, unspecified: Secondary | ICD-10-CM

## 2019-11-13 DIAGNOSIS — R5381 Other malaise: Secondary | ICD-10-CM | POA: Insufficient documentation

## 2019-11-13 LAB — COMPREHENSIVE METABOLIC PANEL
ALT: 12 U/L (ref 0–44)
AST: 17 U/L (ref 15–41)
Albumin: 3.6 g/dL (ref 3.5–5.0)
Alkaline Phosphatase: 82 U/L (ref 38–126)
Anion gap: 6 (ref 5–15)
BUN: 29 mg/dL — ABNORMAL HIGH (ref 8–23)
CO2: 22 mmol/L (ref 22–32)
Calcium: 9.4 mg/dL (ref 8.9–10.3)
Chloride: 106 mmol/L (ref 98–111)
Creatinine, Ser: 1.38 mg/dL — ABNORMAL HIGH (ref 0.44–1.00)
GFR calc Af Amer: 42 mL/min — ABNORMAL LOW (ref >60)
GFR calc non Af Amer: 37 mL/min — ABNORMAL LOW (ref >60)
Glucose, Bld: 118 mg/dL — ABNORMAL HIGH (ref 70–99)
Potassium: 4.1 mmol/L (ref 3.5–5.1)
Sodium: 134 mmol/L — ABNORMAL LOW (ref 135–145)
Total Bilirubin: 0.4 mg/dL (ref 0.3–1.2)
Total Protein: 8.8 g/dL — ABNORMAL HIGH (ref 6.5–8.1)

## 2019-11-13 LAB — CBC WITH DIFFERENTIAL/PLATELET
Abs Immature Granulocytes: 0.01 10*3/uL (ref 0.00–0.07)
Basophils Absolute: 0 10*3/uL (ref 0.0–0.1)
Basophils Relative: 0 %
Eosinophils Absolute: 0.2 10*3/uL (ref 0.0–0.5)
Eosinophils Relative: 5 %
HCT: 27.9 % — ABNORMAL LOW (ref 36.0–46.0)
Hemoglobin: 8.7 g/dL — ABNORMAL LOW (ref 12.0–15.0)
Immature Granulocytes: 0 %
Lymphocytes Relative: 34 %
Lymphs Abs: 1.2 10*3/uL (ref 0.7–4.0)
MCH: 33.9 pg (ref 26.0–34.0)
MCHC: 31.2 g/dL (ref 30.0–36.0)
MCV: 108.6 fL — ABNORMAL HIGH (ref 80.0–100.0)
Monocytes Absolute: 0.8 10*3/uL (ref 0.1–1.0)
Monocytes Relative: 21 %
Neutro Abs: 1.4 10*3/uL — ABNORMAL LOW (ref 1.7–7.7)
Neutrophils Relative %: 40 %
Platelets: 251 10*3/uL (ref 150–400)
RBC: 2.57 MIL/uL — ABNORMAL LOW (ref 3.87–5.11)
RDW: 13.6 % (ref 11.5–15.5)
WBC: 3.6 10*3/uL — ABNORMAL LOW (ref 4.0–10.5)
nRBC: 0 % (ref 0.0–0.2)

## 2019-11-13 LAB — FERRITIN: Ferritin: 152 ng/mL (ref 11–307)

## 2019-11-13 LAB — IRON AND TIBC
Iron: 51 ug/dL (ref 28–170)
Saturation Ratios: 22 % (ref 10.4–31.8)
TIBC: 234 ug/dL — ABNORMAL LOW (ref 250–450)
UIBC: 183 ug/dL

## 2019-11-13 LAB — RETICULOCYTES
Immature Retic Fract: 22.7 % — ABNORMAL HIGH (ref 2.3–15.9)
RBC.: 2.54 MIL/uL — ABNORMAL LOW (ref 3.87–5.11)
Retic Count, Absolute: 53.8 10*3/uL (ref 19.0–186.0)
Retic Ct Pct: 2.1 % (ref 0.4–3.1)

## 2019-11-13 NOTE — Progress Notes (Signed)
[facility]  [address], [address] [place], [place] [address] Phone: [phone]  Fax: [phone]   Clinic Day:  11/13/2019  Referring physician: Venita Lick, NP  Chief Complaint: Bailey Ayala is a 79 y.o. female with multiplemyelomawho is seen for a 1 month assessment and review of interim PET scan.   HPI: The patient was last seen in the medical oncology clinic on 10/21/2019. At that time, her energy level was low.  She described an hour long episode of bleeding from a leg vein.  Exam was stable.  We discussed consideration of treatment.  Patient was interested in ongoing monitoring.  She requested follow-up monthly rather than every 6 weeks.  She contacted the clinic requesting a restaging PET scan.   PET scan on 11/06/2019 revealed innumerable new lytic expansile lesions in the skeleton. Many are small and not appreciably hypermetabolic, but numerous hypermetabolic lesions are also identified especially in the sternum, clavicles, ribs, thoracolumbar spine, bony pelvis, and femurs. Index lesions with maximum SUV up to 17.2. Appearance was compatible with progressive myeloma. There was increase in size of infrarenal abdominal aortic aneurysm, currently 3.9 cm in diameter. Other imaging findings of potential clinical significance: aortic atherosclerosis, coronary atherosclerosis, lingular scarring, postoperative findings in the lumbar spine with likely chronic fracture the right L3 pedicle screw in the mid pedicle region, bilateral crossover toe, and multiple joint prostheses.  During the interim, she notes pain due to the lesions on her bones. She understands that if she refuses treatment, that her lesions will get worse; she is at risk for a bone fracture. At this time, she is unsure if she wants to undergo treatment and would like time to think about it.    Past Medical History:  Diagnosis Date  . AAA (abdominal aortic aneurysm) ([place])    The  largest aortic measurement is 4.0 cm on 05/31/19  . Allergy 1980  . Anemia 09-2017  . Arthritis   . Bleeding from varicose vein   . Blood dyscrasia    BLEEDS EASILY   . Blood transfusion without reported diagnosis 2011 or 12  . Cataract ?  . CHF (congestive heart failure) ([place]) ?  . CKD (chronic kidney disease), stage III    Moderate  . Clotting disorder ([place]) ?  Marland Kitchen COPD (chronic obstructive pulmonary disease) ([place])   . Coronary artery disease   . Diabetes mellitus without complication ([place])    patient denies  . Diffuse cystic mastopathy   . Emphysema of lung ([place]) ?  Marland Kitchen Emphysema of lung ([place])   . Heartburn   . Hyperlipidemia   . Hypertension   . Hypothyroidism   . Incomplete left bundle branch block (LBBB) 06/22/2019   Noted on EKG  . L4-L5 disc bulge   . Multiple myeloma ([place])   . Myocardial infarction ([place]) 2019  . Osteoporosis, post-menopausal   . PAD (peripheral artery disease) ([place])   . Renal insufficiency   . Shortness of breath dyspnea    WITH EXERTION   . Stroke ([place])    TIA     2016     WAS FOUND ON SCAN ORDERED FROM NEUROL  . Thyroid disease   . TIA (transient ischemic attack) 2016   left hand weakness  . Ulnar nerve compression, left   . Vertigo 06/22/2019   doing much today 06/28/2019    Past Surgical History:  Procedure Laterality Date  . ABDOMINAL HYSTERECTOMY  1990  . BACK SURGERY  2011  .  BREAST EXCISIONAL BIOPSY Left    2010?   Marland Kitchen BREAST SURGERY  1975   A lump removed left breast  . CATARACT EXTRACTION, BILATERAL    . COLONOSCOPY  1998  . ENDOSCOPIC VEIN LASER TREATMENT    . EYE SURGERY Bilateral 05/06/14  . JOINT REPLACEMENT     LT KNEE  . KNEE SURGERY Left 2012  . LEFT HEART CATH AND CORONARY ANGIOGRAPHY Left 11/19/2018   Procedure: LEFT HEART CATH AND CORONARY ANGIOGRAPHY;  Surgeon: Wellington Hampshire, MD;  Location: [facility];  Service: Cardiovascular;  Laterality: Left;  . OOPHORECTOMY  1990  . parathyroid transplant     2/3  THYROID REMOVED   ([place])  . SPINE SURGERY    . THYROID SURGERY    . TONSILLECTOMY    . TOTAL HIP ARTHROPLASTY Right 07/02/2019   Procedure: TOTAL HIP ARTHROPLASTY ANTERIOR APPROACH;  Surgeon: Paralee Cancel, MD;  Location: [facility];  Service: Orthopedics;  Laterality: Right;  70 mins  . TOTAL SHOULDER ARTHROPLASTY Left 01/14/2016   Procedure: LEFT TOTAL SHOULDER ARTHROPLASTY;  Surgeon: Justice Britain, MD;  Location: [place] [facility];  Service: Orthopedics;  Laterality: Left;  . TOTAL SHOULDER ARTHROPLASTY Right 11/03/2016   Procedure: RIGHT TOTAL SHOULDER ARTHROPLASTY;  Surgeon: Justice Britain, MD;  Location: [place];  Service: Orthopedics;  Laterality: Right;  . ULNAR NERVE REPAIR     LEFT  . vein closure procedure Left 2010    Family History  Problem Relation Age of Onset  . Heart disease Mother        MI  . Arthritis Mother   . Stroke Mother   . Hyperlipidemia Mother   . Hypertension Mother   . Varicose Veins Mother   . Heart disease Father        MI  . Hyperlipidemia Father   . Arthritis Father   . Cancer Father   . COPD Father   . Hearing loss Father   . Hypertension Father   . Vision loss Father   . Diabetes Sister   . COPD Sister   . Heart disease Sister   . Hyperlipidemia Sister   . Hypertension Sister   . Hyperlipidemia Brother   . COPD Brother   . Diabetes Brother   . Heart disease Brother   . Hypertension Brother   . Arthritis Sister   . Hyperlipidemia Sister   . Hypertension Sister   . Vision loss Sister   . Varicose Veins Sister   . Arthritis Brother   . Varicose Veins Brother   . Arthritis Daughter   . Arthritis Daughter   . Diabetes Daughter   . Cancer Paternal Aunt   . COPD Sister   . Heart disease Sister   . Hyperlipidemia Sister   . Hypertension Sister   . Varicose Veins Sister   . Heart disease Maternal Uncle   . Heart disease Paternal Uncle     Social History:  reports that she quit smoking about 30 years ago. Her smoking use included cigarettes. She has  a 75.00 pack-year smoking history. She has never used smokeless tobacco. She reports previous alcohol use. She reports that she does not use drugs. Patient is a former 3 pack per day smoker for 25 years (75 pack year). Patient lives in [place]. She is a former Electrical engineer. Patient denies known exposures to radiation on toxins. The patient is accompanied by her daughter via [place] today.   Allergies:  Allergies  Allergen Reactions  . Oxycodone Other (  See Comments)    Made her feel "shaky"  . Codeine Other (See Comments)    hallucinations    . Simvastatin Diarrhea and Nausea Only    Current Medications: Current Outpatient Medications  Medication Sig Dispense Refill  . atorvastatin (LIPITOR) 40 MG tablet Take 1 tablet (40 mg total) by mouth daily. 90 tablet 4  . benazepril (LOTENSIN) 20 MG tablet Take 1 tablet (20 mg total) by mouth daily. 90 tablet 4  . diclofenac Sodium (VOLTAREN) 1 % GEL Apply 2 g topically 4 (four) times daily. 50 g 0  . diclofenac Sodium (VOLTAREN) 1 % GEL Apply topically as needed.    . ferrous sulfate (FERROUSUL) 325 (65 FE) MG tablet Take 1 tablet (325 mg total) by mouth 3 (three) times daily with meals for 14 days. (Patient taking differently: Take 325 mg by mouth daily with breakfast. ) 42 tablet 0  . ferrous sulfate 325 (65 FE) MG tablet Take 325 mg by mouth daily with breakfast.    . levothyroxine (SYNTHROID) 100 MCG tablet Take 1 tablet (100 mcg total) by mouth daily. 90 tablet 4  . meclizine (ANTIVERT) 25 MG tablet Take 1 tablet (25 mg total) by mouth 2 (two) times daily as needed for dizziness. 15 tablet 0  . metoprolol tartrate (LOPRESSOR) 50 MG tablet Take 1 tablet (50 mg total) by mouth 2 (two) times daily. 60 tablet 11  . Multiple Vitamin (MULTIVITAMIN WITH MINERALS) TABS tablet Take 1 tablet by mouth daily.    Marland Kitchen triamcinolone cream (KENALOG) 0.1 % Apply 1 application topically 2 (two) times daily. 30 g 0   No current facility-administered medications for  this visit.    Review of Systems  Constitutional: Positive for malaise/fatigue (low energy). Negative for chills, diaphoresis, fever and weight loss.       Doing "not good."  HENT: Negative for congestion, ear discharge, ear pain, hearing loss, nosebleeds, sinus pain, sore throat and tinnitus.   Eyes: Negative for blurred vision and double vision.  Respiratory: Positive for cough (chronic; stable) and shortness of breath. Negative for hemoptysis and sputum production.        COPD  Cardiovascular: Positive for chest pain (sternum). Negative for palpitations and leg swelling.  Gastrointestinal: Positive for diarrhea (chronic) and nausea (intermitent). Negative for abdominal pain, blood in stool, constipation, heartburn, melena and vomiting.  Genitourinary: Negative for dysuria, frequency and urgency.  Musculoskeletal: Negative for back pain, falls, joint pain (s/p right hip replacement), myalgias and neck pain.  Skin: Negative for itching and rash.  Neurological: Negative for dizziness, tingling, sensory change, weakness and headaches.       Aneurysm, near syncope episode on 06/22/2019. Vertigo comes and goes.  Endo/Heme/Allergies: Negative for environmental allergies. Does not bruise/bleed easily.       Hypothyroidism - on levothyroxine.   Psychiatric/Behavioral: Negative for depression and memory loss. The patient is not nervous/anxious and does not have insomnia.   All other systems reviewed and are negative.  Performance status (ECOG): 1  Vitals Blood pressure 139/66, pulse 71, temperature 97.9 F (36.6 C), temperature source Oral, weight 174 lb 15 oz (79.3 kg), SpO2 96 %.   Physical Exam Vitals and nursing note reviewed.  Constitutional:      General: She is not in acute distress.    Appearance: She is not diaphoretic.  HENT:     Head: Normocephalic and atraumatic.     Comments: Shoulder length brown hair. Eyes:     General: No scleral icterus.  Extraocular Movements:  Extraocular movements intact.     Conjunctiva/sclera: Conjunctivae normal.     Comments: Blue eyes.  Neurological:     Mental Status: She is alert and oriented to person, place, and time. Mental status is at baseline.  Psychiatric:        Mood and Affect: Mood normal.        Behavior: Behavior normal.        Thought Content: Thought content normal.        Judgment: Judgment normal.    Appointment on 11/13/2019  Component Date Value Ref Range Status  . Iron 11/13/2019 51  28 - 170 ug/dL Final  . TIBC 11/13/2019 234* 250 - 450 ug/dL Final  . Saturation Ratios 11/13/2019 22  10.4 - 31.8 % Final  . UIBC 11/13/2019 183  ug/dL Final   Performed at [facility], [address] [place], [place] [address]  . Sodium 11/13/2019 134* 135 - 145 mmol/L Final  . Potassium 11/13/2019 4.1  3.5 - 5.1 mmol/L Final  . Chloride 11/13/2019 106  98 - 111 mmol/L Final  . CO2 11/13/2019 22  22 - 32 mmol/L Final  . Glucose, Bld 11/13/2019 118* 70 - 99 mg/dL Final   Glucose reference range applies only to samples taken after fasting for at least 8 hours.  . BUN 11/13/2019 29* 8 - 23 mg/dL Final  . Creatinine, Ser 11/13/2019 1.38* 0.44 - 1.00 mg/dL Final  . Calcium 11/13/2019 9.4  8.9 - 10.3 mg/dL Final  . Total Protein 11/13/2019 8.8* 6.5 - 8.1 g/dL Final  . Albumin 11/13/2019 3.6  3.5 - 5.0 g/dL Final  . AST 11/13/2019 17  15 - 41 U/L Final  . ALT 11/13/2019 12  0 - 44 U/L Final  . Alkaline Phosphatase 11/13/2019 82  38 - 126 U/L Final  . Total Bilirubin 11/13/2019 0.4  0.3 - 1.2 mg/dL Final  . GFR calc non Af Amer 11/13/2019 37* >60 mL/min Final  . GFR calc Af Amer 11/13/2019 42* >60 mL/min Final  . Anion gap 11/13/2019 6  5 - 15 Final   Performed at [facility], [address], [place], [place] [address]  . Ferritin 11/13/2019 152  11 - 307 ng/mL Final   Performed at [facility], [address] [place], [place] [address]  . Retic Ct Pct 11/13/2019 2.1  0.4 - 3.1 %  Final  . RBC. 11/13/2019 2.54* 3.87 - 5.11 MIL/uL Final  . Retic Count, Absolute 11/13/2019 53.8  19.0 - 186.0 K/uL Final  . Immature Retic Fract 11/13/2019 22.7* 2.3 - 15.9 % Final   Performed at [facility], [address], [place],  [address]  . Total Protein ELP 11/13/2019 8.0  6.0 - 8.5 g/dL Final  . Albumin ELP 11/13/2019 3.7  2.9 - 4.4 g/dL Final  . Alpha-1-Globulin 11/13/2019 0.3  0.0 - 0.4 g/dL Final  . Alpha-2-Globulin 11/13/2019 1.1* 0.4 - 1.0 g/dL Final  . Beta Globulin 11/13/2019 0.9  0.7 - 1.3 g/dL Final  . Gamma Globulin 11/13/2019 2.1* 0.4 - 1.8 g/dL Final  . M-Spike, % 11/13/2019 2.0* Not Observed g/dL Final  . SPE Interp. 11/13/2019 Comment   Final   Comment: (NOTE) The SPE pattern demonstrates a single peak (M-spike) in the gamma region which may represent monoclonal protein. This peak may also be caused by circulating immune complexes, cryoglobulins, C-reactive protein, fibrinogen or hemolysis.  If clinically indicated, the presence of a monoclonal gammopathy  may be confirmed by immuno- fixation, as well as an evaluation of the urine for the presence of Bence-Jones protein. Performed At: [facility] [place], [place] [id number] Rush Farmer MD TM:[id number]   . Comment 11/13/2019 Comment   Final   Comment: (NOTE) Protein electrophoresis scan will follow via computer, mail, or courier delivery.   . Globulin, Total 11/13/2019 4.3* 2.2 - 3.9 g/dL Corrected  . A/G Ratio 11/13/2019 0.9  0.7 - 1.7 Corrected  . WBC 11/13/2019 3.6* 4.0 - 10.5 K/uL Final  . RBC 11/13/2019 2.57* 3.87 - 5.11 MIL/uL Final  . Hemoglobin 11/13/2019 8.7* 12.0 - 15.0 g/dL Final  . HCT 11/13/2019 27.9* 36 - 46 % Final  . MCV 11/13/2019 108.6* 80.0 - 100.0 fL Final  . MCH 11/13/2019 33.9  26.0 - 34.0 pg Final  . MCHC 11/13/2019 31.2  30.0 - 36.0 g/dL Final  . RDW 11/13/2019 13.6  11.5 - 15.5 % Final  . Platelets 11/13/2019 251  150 - 400 K/uL Final   . nRBC 11/13/2019 0.0  0.0 - 0.2 % Final  . Neutrophils Relative % 11/13/2019 40  % Final  . Neutro Abs 11/13/2019 1.4* 1.7 - 7.7 K/uL Final  . Lymphocytes Relative 11/13/2019 34  % Final  . Lymphs Abs 11/13/2019 1.2  0.7 - 4.0 K/uL Final  . Monocytes Relative 11/13/2019 21  % Final  . Monocytes Absolute 11/13/2019 0.8  0 - 1 K/uL Final  . Eosinophils Relative 11/13/2019 5  % Final  . Eosinophils Absolute 11/13/2019 0.2  0 - 0 K/uL Final  . Basophils Relative 11/13/2019 0  % Final  . Basophils Absolute 11/13/2019 0.0  0 - 0 K/uL Final  . Immature Granulocytes 11/13/2019 0  % Final  . Abs Immature Granulocytes 11/13/2019 0.01  0.00 - 0.07 K/uL Final   Performed at [facility], [address], [place], [place] [address]    Assessment:  Bailey Ayala is a 79 y.o. female with active multiple myeloma. SPEPon 06/21/2017 revealed a 1.2 gm/dL monoclonal spike. Random urine revealed 4.6 mg/dL protein and a 40.6% M-spike. Calcium, albumin, and serum protein were normal.  Work-up on 03/08/2019revealed a hematocrit of 31.1, hemoglobin 10.4, MCV 97.4, platelets 227,000, WBC 5700 with an ANC of 3300. SPEPrevealed a 1.1 gm/dL IgG monoclonal protein with lambda light chain specificity and monoclonal free lambda light chains(Bence-Jones protein). IgG was 1660 [phone]) and IgA was 48 (64-422). Kappa free light chains were 23.7, lambda free light chains were 142.2 and ratio 0.17 (0.26-1.65). Beta2-microglobulin was 4.7 (0.6-2.4). Normal studies included: B12, folate, and ferritin (75). TSHwas 0.204 (low). 24 hour UPEPrevealed kappa free light chains 28.80 mg/L, lambda free light chains 63.80 mg/L and ration 0.45 (2.04 - 10.37). M spike was 27.4% (16 mg/24 hours).  Bone surveyon 06/30/2017 revealed no suspicious focal bony lesions.  Bone marrow aspirate and biopsyon 07/26/2017 revealed a slightly hypercellular marrow for age with trilineage hematopoiesis. There was 13%  plasmacytosis(10-15% byCD138IHC). Plasma cells were ininterstitial cells and small clusters. Lambda light chains appeared restricted. Features were felt compatible with plasma cell neoplasm.  Bone marrow aspirate and biopsy on 05/29/2019 revealed a normocellularmarrow for age (20-30%) withincreased monoclonal plasma cells (15% aspirate, 20-30% CD138by IHC).Plasma cells were lambda restricted.There were no myelodysplastic changes.Storage iron was present. Flow cytometry revealed no monoclonal B-cell or phenotypically aberrant T-cell population. Findings were consistent with persistent plasma cell myeloma.Peripheral bloodrevealed amacrocytic anemia with rouleaux formation.Cytogenetics were normal (92, XX). FISH for MDSrevealed  gain of KMT2A (MLL) or chromosome 11/11q (55F.5.0%, normal <3.1%).FISH for myeloma revealed a deletion of NF1 or chromosome 17q11 (2R1G, 89%, not seen in validation studies).  PET scanon 08/14/2017 revealed no hypermetabolic lesions of myeloma. There was a 3.6 cm infrarenal aortic aneurysm. Ultrasound in 2 years was recommended. She had a 4 mm LLL noduletoo small to characterize.  SPEPhas been followed: 1.1 on 06/30/2017, 1.2 on 11/14/2017, 1.3 on 01/26/2018, 1.2 on 05/10/2018, 1.4 on 08/08/2018, 1.2 on 11/09/2018, 1.4 on 02/11/2019, 1.6 on 05/09/2019, 1.6 on 08/07/2019, 1.8 on 10/21/2019, and 2.0 on 11/13/2019. IgGhas been followed: 1660 on 06/30/2017, 1684 on 11/14/2017, 1795 on 01/26/2018, and 2054 on 08/08/2018.  Lambda free light chainshave been followed:142.2 (ratio 0.17)on03/11/2017, 214.4(ratio 0.09)on01/16/2020, 209.3(ratio 0.08)on04/14/2020, 220.2(ratio 0.07)on07/17/2020, 228.9(ratio 0.08)on 02/11/2019,258.4 (ratio 0.07)on01/14/2021, 341.6 (ratio 0.05) and 295.1 (ratio 0.05) on 10/21/2019.  Chest CTon 01/24/2018 revealed persistent LLL nodule. Area calcified and felt to be consistent with a granuloma. There were  no other suspicious pulmonary nodules. There was new Schmorl's node formation involving the inferior endplate of O75. There were stable small scattered osseous lesions related to underlying multiple myeloma.  PET scanon 05/15/2018 revealed nohypermetabolic lesions characteristic of active myeloma. There was stable hypermetabolic apical wall of the left ventricle, there waspotentially a small pseudoaneurysmin this vicinity, but no appreciable mass was noted on 01/24/2018. There was aninfrarenal abdominal aortic aneurysm, 3.6 cm in diameter. Follow-up ultrasound in 2 years was recommended. Other imaging findings of potential clinical significance: aortic atherosclerosis and coronary atherosclerosis.  Bone survery on01/25/2021 showed numerous new lytic lesions throughout the skeleton including the posterior calvarium, bilateral humeri, bilateral iliac bones, left inferior pubic ramus, bilateral femurs.  PET scan on 11/06/2019 revealed innumerable new lytic expansile lesions in the skeleton. Many are small and not appreciably hypermetabolic, but numerous hypermetabolic lesions are also identified especially in the sternum, clavicles, ribs, thoracolumbar spine, bony pelvis, and femurs. Index lesions with maximum SUV up to 17.2. Appearance was compatible with progressive myeloma. There was increase in size of infrarenal abdominal aortic aneurysm, currently 3.9 cm in diameter. Other imaging findings of potential clinical significance: aortic atherosclerosis, coronary atherosclerosis, lingular scarring, postoperative findings in the lumbar spine with likely chronic fracture the right L3 pedicle screw in the mid pedicle region, bilateral crossover toe, and multiple joint prostheses.  She has stage III chronic kidney disease(Cr 2.25 on 05/27/2019 and 1.38 on 11/13/2019).   She has a macrocytic anemia. B12, folate, and TSH were normal on 05/10/2018. Retic was 1.6%. Ferritin and iron studies were  normal on 05/10/2018. Appetiteremains poor. Last colonoscopywas >10 years ago (she declines repeat).  She has a 75 pack year smoking history. She has COPD. Chest CT without contraston 07/25/2017 revealed no suspicious pulmonary nodules.  Sheunderwent total right hip replacementon 07/02/2019. She required 2 units of PRBCs.   Symptomatically, she notes pain due to lytic bone lesions.  Plan: 1.   Labs today:  CBC with diff, CMP, SPEP, FLCA. 2.   Multiple myeloma  She has progressive disease with symptomatic bone lesions. M spikewas 2.0 gm/dL today (increased). Lambda free light chains295(ratio 0.05) on 10/21/2019. Bone surveyon 05/20/2019 revealednumerous new lytic lesions throughout the skeleton. PET scan on 11/06/2019 was personally reviewed. Agree with radiology interpretation.  She has innumerable bone lesions. Images reviewed with patient. Bone marrow aspirateon 05/29/2019 revealed anormocellularmarrow for age (20-30%) withincreased monoclonal plasma cells (20-30% CD138IHC). Cytogenetics were normal (65, XX).  FISH for myeloma revealed a deletion of NF1 or chromosome 17q11 (2R1G, 89%, not  seen in validation studies). Marrow plasmacytosis has increased from 10-15% to 20-30%since 07/2017. Re-review CRAB criteria. Creatinine 1.38, anemia (Hgb 8.7),and numerous symptomatic lytic bone lesions.  Calcium remains normal (9.4). Sternal pain clearly represents bone related disease due to myeloma. Discuss consideration of RVD lite vs daratumumab, Revlimid and Decadron (dara-Rd).  Potential side effects we reviewed.  I encouraged consideration of dara-Rd.  Multiple questions asked and answered Patient states that she would like to defer treatment at this time but continue ongoing surveillance.                      3. Sternal pain PET scan on 11/06/2019 confirmed lytic lesion in the sternum. Continue to monitor. 4.  Renal insufficiency Creatinine 1.38today. Continue to monitor 5. Macrocytic anemia Hematocrit 27.9. Hemoglobin 8.7. MCV 108.6 today. B12was 911 and folate 60.9. on 05/16/2019. Shehas no liver disease. Bone marrow on 02/03/2021revealed no evidence of MDS.  Continue to monitor. 6.Vascular issues in feet Patient notes recent significant blood loss secondary to fragile blood vessels in feet. Patient is followed by vascular surgery. 7.   Cancel next week's appt. 8.   RN to call patient with M-spike. 9.   RTC in 4 weeks for MD assessment and labs (CBC with diff, CMP, SPEP, FLCA).  I discussed the assessment and treatment plan with the patient.  The patient was provided an opportunity to ask questions and all were answered.  The patient agreed with the plan and demonstrated an understanding of the instructions.  The patient was advised to call back if the symptoms worsen or if the condition fails to improve as anticipated.   Lequita Asal, MD, PhD    11/13/2019, 5: 00 PM  I, [employer], am acting as a Education administrator for [employer]. Mike Gip, MD.   I, Melissa C. Mike Gip, MD, have reviewed the above documentation for accuracy and completeness, and I agree with the above.

## 2019-11-15 LAB — PROTEIN ELECTROPHORESIS, SERUM
A/G Ratio: 0.9 (ref 0.7–1.7)
Albumin ELP: 3.7 g/dL (ref 2.9–4.4)
Alpha-1-Globulin: 0.3 g/dL (ref 0.0–0.4)
Alpha-2-Globulin: 1.1 g/dL — ABNORMAL HIGH (ref 0.4–1.0)
Beta Globulin: 0.9 g/dL (ref 0.7–1.3)
Gamma Globulin: 2.1 g/dL — ABNORMAL HIGH (ref 0.4–1.8)
Globulin, Total: 4.3 g/dL — ABNORMAL HIGH (ref 2.2–3.9)
M-Spike, %: 2 g/dL — ABNORMAL HIGH
Total Protein ELP: 8 g/dL (ref 6.0–8.5)

## 2019-11-20 ENCOUNTER — Other Ambulatory Visit: Payer: Medicare Other

## 2019-11-20 ENCOUNTER — Ambulatory Visit: Payer: Medicare Other | Admitting: Hematology and Oncology

## 2019-11-21 ENCOUNTER — Other Ambulatory Visit: Payer: Self-pay

## 2019-11-21 ENCOUNTER — Encounter: Payer: Self-pay | Admitting: Podiatry

## 2019-11-21 ENCOUNTER — Ambulatory Visit (INDEPENDENT_AMBULATORY_CARE_PROVIDER_SITE_OTHER): Payer: Medicare Other | Admitting: Podiatry

## 2019-11-21 DIAGNOSIS — B351 Tinea unguium: Secondary | ICD-10-CM | POA: Diagnosis not present

## 2019-11-21 DIAGNOSIS — N1832 Chronic kidney disease, stage 3b: Secondary | ICD-10-CM

## 2019-11-21 DIAGNOSIS — L84 Corns and callosities: Secondary | ICD-10-CM

## 2019-11-21 DIAGNOSIS — M79675 Pain in left toe(s): Secondary | ICD-10-CM

## 2019-11-21 DIAGNOSIS — M79674 Pain in right toe(s): Secondary | ICD-10-CM | POA: Diagnosis not present

## 2019-11-21 NOTE — Progress Notes (Signed)
This patient returns to my office for at risk foot care.  This patient requires this care by a professional since this patient will be at risk due to having PAD and chronic kidney disease.  This patient is unable to cut nails herself since the patient cannot reach her nails.These nails are painful walking and wearing shoes.  This patient presents for at risk foot care today.  General Appearance  Alert, conversant and in no acute stress.  Vascular  Dorsalis pedis and posterior tibial  pulses are palpable  bilaterally.  Capillary return is within normal limits  bilaterally. Temperature is within normal limits  bilaterally.  Neurologic  Senn-Weinstein monofilament wire test diminished   bilaterally. Muscle power within normal limits bilaterally.  Nails Thick disfigured discolored nails with subungual debris  from hallux to fifth toes bilaterally. No evidence of bacterial infection or drainage bilaterally.  Orthopedic  No limitations of motion  feet .  No crepitus or effusions noted.  No bony pathology or digital deformities noted.  HAV with overlapping second digits  B/L.  PTTD and DJD right rearfoot. Contracted digits  B/L.  Skin  normotropic skin with no porokeratosis noted bilaterally.  No signs of infections or ulcers noted.     Onychomycosis  Pain in right toes  Pain in left toes  Consent was obtained for treatment procedures.   Mechanical debridement of nails 1-5  bilaterally performed with a nail nipper.  Filed with dremel without incident. Patient requests a f/u appointment so that her insurance covers her visit.   Return office visit   9 weeks                    Told patient to return for periodic foot care and evaluation due to potential at risk complications.   Gardiner Barefoot DPM

## 2019-11-23 ENCOUNTER — Encounter: Payer: Self-pay | Admitting: Hematology and Oncology

## 2019-11-25 ENCOUNTER — Other Ambulatory Visit: Payer: Self-pay | Admitting: Hematology and Oncology

## 2019-11-25 DIAGNOSIS — R197 Diarrhea, unspecified: Secondary | ICD-10-CM

## 2019-11-26 ENCOUNTER — Other Ambulatory Visit: Payer: Self-pay

## 2019-11-26 ENCOUNTER — Telehealth: Payer: Self-pay

## 2019-11-26 DIAGNOSIS — R197 Diarrhea, unspecified: Secondary | ICD-10-CM

## 2019-11-26 LAB — C DIFFICILE QUICK SCREEN W PCR REFLEX
C Diff antigen: NEGATIVE
C Diff interpretation: NOT DETECTED
C Diff toxin: NEGATIVE

## 2019-11-26 LAB — GASTROINTESTINAL PANEL BY PCR, STOOL (REPLACES STOOL CULTURE)

## 2019-11-26 NOTE — Telephone Encounter (Signed)
error 

## 2019-11-26 NOTE — Telephone Encounter (Signed)
-----   Message from Lequita Asal, MD sent at 11/26/2019  3:23 PM EDT ----- Regarding: Please call patient- stool studies negative  ----- Message ----- From: Interface, Lab In [place] Sent: 11/26/2019   1:30 PM EDT To: Lequita Asal, MD

## 2019-11-26 NOTE — Telephone Encounter (Signed)
Called patient regarding lab work. No answer and no voicemail available.

## 2019-11-27 ENCOUNTER — Encounter (INDEPENDENT_AMBULATORY_CARE_PROVIDER_SITE_OTHER): Payer: Self-pay | Admitting: Vascular Surgery

## 2019-11-27 ENCOUNTER — Ambulatory Visit (INDEPENDENT_AMBULATORY_CARE_PROVIDER_SITE_OTHER): Payer: Medicare Other | Admitting: Vascular Surgery

## 2019-11-27 ENCOUNTER — Telehealth: Payer: Self-pay

## 2019-11-27 ENCOUNTER — Other Ambulatory Visit: Payer: Self-pay

## 2019-11-27 VITALS — BP 116/66 | HR 84 | Resp 16 | Wt 170.6 lb

## 2019-11-27 DIAGNOSIS — I83891 Varicose veins of right lower extremities with other complications: Secondary | ICD-10-CM | POA: Diagnosis not present

## 2019-11-27 NOTE — Telephone Encounter (Signed)
-----   Message from Lequita Asal, MD sent at 11/26/2019  3:23 PM EDT ----- Regarding: Please call patient- stool studies negative  ----- Message ----- From: Interface, Lab In [place] Sent: 11/26/2019   1:30 PM EDT To: Lequita Asal, MD

## 2019-11-27 NOTE — Telephone Encounter (Signed)
Patient aware stool studies neg

## 2019-11-27 NOTE — Progress Notes (Signed)
Varicose veins of bilateral  lower extremity with inflammation (454.1  I83.10) Current Plans   Indication: Patient presents with symptomatic varicose veins of the bilateral  lower extremity.   Procedure: Sclerotherapy using hypertonic saline mixed with 1% Lidocaine was performed on the bilateral lower extremity. Compression wraps were placed. The patient tolerated the procedure well. 

## 2019-12-10 NOTE — Progress Notes (Signed)
[facility]  [address] [place], [place] [address] Phone: [phone]  Fax: [phone]   Clinic Day:  12/11/2019  Referring physician: Venita Lick, NP  Chief Complaint: Bailey Ayala is a 79 y.o. female with multiplemyelomawho is seen for 1 month assessment.  HPI: The patient was last seen in the medical oncology clinic on 11/13/2019. At that time, she notes pain due to lytic bone lesions. Hematocrit was 27.9, hemoglobin 8.7, MCV 108.6, platelets 251,000, WBC 3,600 (ANC 1,400). Sodium was 134. BUN was 29. Creatinine was 1.38 (CrCl 37 ml/min). Total protein was 8.8. Ferritin was 152 with an iron saturation of 22% and a TIBC of 234. Reticulocyte count was 2.1%. M spike was 2.0 g/dL. She wanted to defer treatment and continue with surveillance.   The patient reached out via [place] on 11/23/2019 with diarrhea. C diff and GI panel were negative. Imodium was recommended.  During the interim, she has been okay. She still has a burning sternum pain (6/10) that flares up at night and feels like "a jagged saw." She takes Tylenol, which helps for a couple of hours. Denies any other bone pain. She states that she is going on a trip next week but after she comes back, she would like to try a stronger pain medication. She thinks that she has been on Tramadol before.  Her energy level is "zero" and she is tired all the time. Her shortness of breath is the same and affects her the most at night. She was nauseous the other day and needs more nausea medication. She still has diarrhea and a chronic cough. Her vertigo has resolved. She takes Tums for heartburn.  The patient states that she doesn't want any multiple myeloma treatment.  She does not want treatment for her hypercalcemia.  She does not know if she wants transfusions.  The patient's daughter states that the patient has a DNR order but is unsure about DNI. They would like information about  Hospice.   Past Medical History:  Diagnosis Date  . AAA (abdominal aortic aneurysm) ([place])    The largest aortic measurement is 4.0 cm on 05/31/19  . Allergy 1980  . Anemia 09-2017  . Arthritis   . Bleeding from varicose vein   . Blood dyscrasia    BLEEDS EASILY   . Blood transfusion without reported diagnosis 2011 or 12  . Cataract ?  . CHF (congestive heart failure) ([place]) ?  . CKD (chronic kidney disease), stage III    Moderate  . Clotting disorder ([place]) ?  Marland Kitchen COPD (chronic obstructive pulmonary disease) ([place])   . Coronary artery disease   . Diabetes mellitus without complication ([place])    patient denies  . Diffuse cystic mastopathy   . Emphysema of lung ([place]) ?  Marland Kitchen Emphysema of lung ([place])   . Heartburn   . Hyperlipidemia   . Hypertension   . Hypothyroidism   . Incomplete left bundle branch block (LBBB) 06/22/2019   Noted on EKG  . L4-L5 disc bulge   . Multiple myeloma ([place])   . Myocardial infarction ([place]) 2019  . Osteoporosis, post-menopausal   . PAD (peripheral artery disease) ([place])   . Renal insufficiency   . Shortness of breath dyspnea    WITH EXERTION   . Stroke ([place])    TIA     2016     WAS FOUND ON SCAN ORDERED FROM NEUROL  . Thyroid disease   . TIA (transient ischemic attack)  2016   left hand weakness  . Ulnar nerve compression, left   . Vertigo 06/22/2019   doing much today 06/28/2019    Past Surgical History:  Procedure Laterality Date  . ABDOMINAL HYSTERECTOMY  1990  . BACK SURGERY  2011  . BREAST EXCISIONAL BIOPSY Left    2010?   Marland Kitchen BREAST SURGERY  1975   A lump removed left breast  . CATARACT EXTRACTION, BILATERAL    . COLONOSCOPY  1998  . ENDOSCOPIC VEIN LASER TREATMENT    . EYE SURGERY Bilateral 05/06/14  . JOINT REPLACEMENT     LT KNEE  . KNEE SURGERY Left 2012  . LEFT HEART CATH AND CORONARY ANGIOGRAPHY Left 11/19/2018   Procedure: LEFT HEART CATH AND CORONARY ANGIOGRAPHY;  Surgeon: Wellington Hampshire, MD;  Location: [facility];   Service: Cardiovascular;  Laterality: Left;  . OOPHORECTOMY  1990  . parathyroid transplant     2/3 THYROID REMOVED   ([place])  . SPINE SURGERY    . THYROID SURGERY    . TONSILLECTOMY    . TOTAL HIP ARTHROPLASTY Right 07/02/2019   Procedure: TOTAL HIP ARTHROPLASTY ANTERIOR APPROACH;  Surgeon: Paralee Cancel, MD;  Location: [facility];  Service: Orthopedics;  Laterality: Right;  70 mins  . TOTAL SHOULDER ARTHROPLASTY Left 01/14/2016   Procedure: LEFT TOTAL SHOULDER ARTHROPLASTY;  Surgeon: Justice Britain, MD;  Location: [facility];  Service: Orthopedics;  Laterality: Left;  . TOTAL SHOULDER ARTHROPLASTY Right 11/03/2016   Procedure: RIGHT TOTAL SHOULDER ARTHROPLASTY;  Surgeon: Justice Britain, MD;  Location: [facility];  Service: Orthopedics;  Laterality: Right;  . ULNAR NERVE REPAIR     LEFT  . vein closure procedure Left 2010    Family History  Problem Relation Age of Onset  . Heart disease Mother        MI  . Arthritis Mother   . Stroke Mother   . Hyperlipidemia Mother   . Hypertension Mother   . Varicose Veins Mother   . Heart disease Father        MI  . Hyperlipidemia Father   . Arthritis Father   . Cancer Father   . COPD Father   . Hearing loss Father   . Hypertension Father   . Vision loss Father   . Diabetes Sister   . COPD Sister   . Heart disease Sister   . Hyperlipidemia Sister   . Hypertension Sister   . Hyperlipidemia Brother   . COPD Brother   . Diabetes Brother   . Heart disease Brother   . Hypertension Brother   . Arthritis Sister   . Hyperlipidemia Sister   . Hypertension Sister   . Vision loss Sister   . Varicose Veins Sister   . Arthritis Brother   . Varicose Veins Brother   . Arthritis Daughter   . Arthritis Daughter   . Diabetes Daughter   . Cancer Paternal Aunt   . COPD Sister   . Heart disease Sister   . Hyperlipidemia Sister   . Hypertension Sister   . Varicose Veins Sister   . Heart disease Maternal Uncle   . Heart disease Paternal Uncle     Social  History:  reports that she quit smoking about 30 years ago. Her smoking use included cigarettes. She has a 75.00 pack-year smoking history. She has never used smokeless tobacco. She reports previous alcohol use. She reports that she does not use drugs. Patient is a former 3 pack per day  smoker for 25 years (75 pack year). Patient lives in [place]. She is a former Electrical engineer. Patient denies known exposures to radiation on toxins. The patient is accompanied by one daughter in person and another daughter [name]) by phone today.   Allergies:  Allergies  Allergen Reactions  . Oxycodone Other (See Comments)    Made her feel "shaky"  . Codeine Other (See Comments)    hallucinations    . Simvastatin Diarrhea and Nausea Only    Current Medications: Current Outpatient Medications  Medication Sig Dispense Refill  . acetaminophen (TYLENOL) 325 MG tablet Take 650 mg by mouth every 6 (six) hours as needed.    Marland Kitchen atorvastatin (LIPITOR) 40 MG tablet Take 1 tablet (40 mg total) by mouth daily. 90 tablet 4  . benazepril (LOTENSIN) 20 MG tablet Take 1 tablet (20 mg total) by mouth daily. 90 tablet 4  . ferrous sulfate 325 (65 FE) MG tablet Take 325 mg by mouth daily with breakfast.    . levothyroxine (SYNTHROID) 100 MCG tablet Take 1 tablet (100 mcg total) by mouth daily. 90 tablet 4  . meclizine (ANTIVERT) 25 MG tablet Take 1 tablet (25 mg total) by mouth 2 (two) times daily as needed for dizziness. 15 tablet 0  . metoprolol tartrate (LOPRESSOR) 50 MG tablet Take 1 tablet (50 mg total) by mouth 2 (two) times daily. 60 tablet 11  . Multiple Vitamin (MULTIVITAMIN WITH MINERALS) TABS tablet Take 1 tablet by mouth daily.    . diclofenac Sodium (VOLTAREN) 1 % GEL Apply 2 g topically 4 (four) times daily. 50 g 0  . diclofenac Sodium (VOLTAREN) 1 % GEL Apply topically as needed. (Patient not taking: Reported on 12/11/2019)    . ferrous sulfate (FERROUSUL) 325 (65 FE) MG tablet Take 1 tablet (325 mg total) by mouth  3 (three) times daily with meals for 14 days. (Patient taking differently: Take 325 mg by mouth daily with breakfast. ) 42 tablet 0  . triamcinolone cream (KENALOG) 0.1 % Apply 1 application topically 2 (two) times daily. (Patient not taking: Reported on 12/11/2019) 30 g 0   No current facility-administered medications for this visit.    Review of Systems  Constitutional: Positive for malaise/fatigue (low energy) and weight loss (3 lbs). Negative for chills, diaphoresis and fever.  HENT: Negative for congestion, ear discharge, ear pain, hearing loss, nosebleeds, sinus pain, sore throat and tinnitus.   Eyes: Negative for blurred vision and double vision.  Respiratory: Positive for cough (chronic; stable) and shortness of breath (worse at night). Negative for hemoptysis and sputum production.        COPD  Cardiovascular: Positive for chest pain (sternum). Negative for palpitations and leg swelling.  Gastrointestinal: Positive for diarrhea (chronic) and nausea (intermitent). Negative for abdominal pain, blood in stool, constipation, heartburn, melena and vomiting.  Genitourinary: Negative for dysuria, frequency and urgency.  Musculoskeletal: Negative for back pain, falls, joint pain (s/p right hip replacement), myalgias and neck pain.  Skin: Negative for itching and rash.  Neurological: Negative for dizziness, tingling, sensory change, weakness and headaches.       Aneurysm, near syncope episode on 06/22/2019.  Endo/Heme/Allergies: Negative for environmental allergies. Does not bruise/bleed easily.       Hypothyroidism - on levothyroxine.   Psychiatric/Behavioral: Negative for depression and memory loss. The patient is not nervous/anxious and does not have insomnia.   All other systems reviewed and are negative.  Performance status (ECOG): 2  Vitals Blood pressure 130/60, pulse 71,  resp. rate 18, weight 171 lb 15.3 oz (78 kg), SpO2 100 %.   Physical Exam Vitals and nursing note reviewed.   Constitutional:      General: She is not in acute distress.    Appearance: She is not diaphoretic.     Interventions: Face mask in place.     Comments: Fatigued appearing woman sitting comfortably in the exam room in no acute distress.  She has a cane by her side.  HENT:     Head: Normocephalic and atraumatic.     Comments: Shoulder length brown hair.    Mouth/Throat:     Mouth: Mucous membranes are moist.     Pharynx: Oropharynx is clear.  Eyes:     General: No scleral icterus.    Extraocular Movements: Extraocular movements intact.     Conjunctiva/sclera: Conjunctivae normal.     Pupils: Pupils are equal, round, and reactive to light.     Comments: Blue eyes.  Cardiovascular:     Rate and Rhythm: Normal rate and regular rhythm.     Heart sounds: Normal heart sounds. No murmur heard.   Pulmonary:     Effort: Pulmonary effort is normal. No respiratory distress.     Breath sounds: Normal breath sounds. No wheezing or rales.  Chest:     Chest wall: No tenderness.  Abdominal:     General: Bowel sounds are normal. There is no distension.     Palpations: Abdomen is soft. There is no mass.     Tenderness: There is no abdominal tenderness. There is no guarding or rebound.  Musculoskeletal:        General: Tenderness (sternum) present. No swelling. Normal range of motion.     Cervical back: Normal range of motion and neck supple.  Lymphadenopathy:     Head:     Right side of head: No preauricular, posterior auricular or occipital adenopathy.     Left side of head: No preauricular, posterior auricular or occipital adenopathy.     Cervical: No cervical adenopathy.     Upper Body:     Right upper body: No supraclavicular or axillary adenopathy.     Left upper body: No supraclavicular or axillary adenopathy.     Lower Body: No right inguinal adenopathy. No left inguinal adenopathy.  Skin:    General: Skin is warm and dry.  Neurological:     Mental Status: She is alert and oriented  to person, place, and time. Mental status is at baseline.  Psychiatric:        Behavior: Behavior normal.        Thought Content: Thought content normal.        Judgment: Judgment normal.    Appointment on 12/11/2019  Component Date Value Ref Range Status  . Sodium 12/11/2019 132* 135 - 145 mmol/L Final  . Potassium 12/11/2019 3.9  3.5 - 5.1 mmol/L Final  . Chloride 12/11/2019 104  98 - 111 mmol/L Final  . CO2 12/11/2019 23  22 - 32 mmol/L Final  . Glucose, Bld 12/11/2019 92  70 - 99 mg/dL Final   Glucose reference range applies only to samples taken after fasting for at least 8 hours.  . BUN 12/11/2019 27* 8 - 23 mg/dL Final  . Creatinine, Ser 12/11/2019 1.49* 0.44 - 1.00 mg/dL Final  . Calcium 12/11/2019 10.7* 8.9 - 10.3 mg/dL Final  . Total Protein 12/11/2019 9.0* 6.5 - 8.1 g/dL Final  . Albumin 12/11/2019 3.0* 3.5 - 5.0 g/dL Final  .  AST 12/11/2019 16  15 - 41 U/L Final  . ALT 12/11/2019 11  0 - 44 U/L Final  . Alkaline Phosphatase 12/11/2019 84  38 - 126 U/L Final  . Total Bilirubin 12/11/2019 <0.1* 0.3 - 1.2 mg/dL Final  . GFR calc non Af Amer 12/11/2019 33* >60 mL/min Final  . GFR calc Af Amer 12/11/2019 39* >60 mL/min Final  . Anion gap 12/11/2019 5  5 - 15 Final   Performed at [facility], [address], [place], [place] [address]  . WBC 12/11/2019 4.7  4.0 - 10.5 K/uL Final  . RBC 12/11/2019 2.30* 3.87 - 5.11 MIL/uL Final  . Hemoglobin 12/11/2019 7.8* 12.0 - 15.0 g/dL Final  . HCT 12/11/2019 25.0* 36 - 46 % Final  . MCV 12/11/2019 108.7* 80.0 - 100.0 fL Final  . MCH 12/11/2019 33.9  26.0 - 34.0 pg Final  . MCHC 12/11/2019 31.2  30.0 - 36.0 g/dL Final  . RDW 12/11/2019 13.6  11.5 - 15.5 % Final  . Platelets 12/11/2019 369  150 - 400 K/uL Final  . nRBC 12/11/2019 0.0  0.0 - 0.2 % Final  . Neutrophils Relative % 12/11/2019 53  % Final  . Neutro Abs 12/11/2019 2.5  1.7 - 7.7 K/uL Final  . Lymphocytes Relative 12/11/2019 27  % Final  . Lymphs Abs  12/11/2019 1.3  0.7 - 4.0 K/uL Final  . Monocytes Relative 12/11/2019 16  % Final  . Monocytes Absolute 12/11/2019 0.8  0 - 1 K/uL Final  . Eosinophils Relative 12/11/2019 3  % Final  . Eosinophils Absolute 12/11/2019 0.1  0 - 0 K/uL Final  . Basophils Relative 12/11/2019 0  % Final  . Basophils Absolute 12/11/2019 0.0  0 - 0 K/uL Final  . Immature Granulocytes 12/11/2019 1  % Final  . Abs Immature Granulocytes 12/11/2019 0.05  0.00 - 0.07 K/uL Final   Performed at [facility], [address] [place], [place] [address]    Assessment:  Bailey Ayala is a 79 y.o. female with active multiple myeloma. SPEPon 06/21/2017 revealed a 1.2 gm/dL monoclonal spike. Random urine revealed 4.6 mg/dL protein and a 40.6% M-spike. Calcium, albumin, and serum protein were normal.  Work-up on 03/08/2019revealed a hematocrit of 31.1, hemoglobin 10.4, MCV 97.4, platelets 227,000, WBC 5700 with an ANC of 3300. SPEPrevealed a 1.1 gm/dL IgG monoclonal protein with lambda light chain specificity and monoclonal free lambda light chains(Bence-Jones protein). IgG was 1660 ([phone]) and IgA was 48 (64-422). Kappa free light chains were 23.7, lambda free light chains were 142.2 and ratio 0.17 (0.26-1.65). Beta2-microglobulin was 4.7 (0.6-2.4). Normal studies included: B12, folate, and ferritin (75). TSHwas 0.204 (low). 24 hour UPEPrevealed kappa free light chains 28.80 mg/L, lambda free light chains 63.80 mg/L and ration 0.45 (2.04 - 10.37). M spike was 27.4% (16 mg/24 hours).  Bone surveyon 06/30/2017 revealed no suspicious focal bony lesions.  Bone marrow aspirate and biopsyon 07/26/2017 revealed a slightly hypercellular marrow for age with trilineage hematopoiesis. There was 13% plasmacytosis(10-15% byCD138IHC). Plasma cells were ininterstitial cells and small clusters. Lambda light chains appeared restricted. Features were felt compatible with plasma cell neoplasm.  Bone  marrow aspirate and biopsy on 05/29/2019 revealed a normocellularmarrow for age (20-30%) withincreased monoclonal plasma cells (15% aspirate, 20-30% CD138by IHC).Plasma cells were lambda restricted.There were no myelodysplastic changes.Storage iron was present. Flow cytometry revealed no monoclonal B-cell or phenotypically aberrant T-cell population. Findings were consistent with persistent plasma cell myeloma.Peripheral bloodrevealed  amacrocytic anemia with rouleaux formation.Cytogenetics were normal (86, XX). FISH for MDSrevealed gain of KMT2A (MLL) or chromosome 11/11q (45F.5.0%, normal <3.1%).FISH for myeloma revealed a deletion of NF1 or chromosome 17q11 (2R1G, 89%, not seen in validation studies).  PET scanon 08/14/2017 revealed no hypermetabolic lesions of myeloma. There was a 3.6 cm infrarenal aortic aneurysm. Ultrasound in 2 years was recommended. She had a 4 mm LLL noduletoo small to characterize.  SPEPhas been followed: 1.1 on 06/30/2017, 1.2 on 11/14/2017, 1.3 on 01/26/2018, 1.2 on 05/10/2018, 1.4 on 08/08/2018, 1.2 on 11/09/2018, 1.4 on 02/11/2019, 1.6 on 05/09/2019, 1.6 on 08/07/2019, 1.8 on 10/21/2019, 2.0 on 11/13/2019 and 2.1 on 12/11/2019. IgGhas been followed: 1660 on 06/30/2017, 1684 on 11/14/2017, 1795 on 01/26/2018, and 2054 on 08/08/2018.  Lambda free light chainshave been followed:142.2 (ratio 0.17)on03/11/2017, 214.4(ratio 0.09)on01/16/2020, 209.3(ratio 0.08)on04/14/2020, 220.2(ratio 0.07)on07/17/2020, 228.9(ratio 0.08)on 02/11/2019,258.4 (ratio 0.07)on01/14/2021, 341.6 (ratio 0.05), 295.1 (ratio 0.05) on 10/21/2019, and 477.1 (ratio 0.04) on 12/11/2019.  Chest CTon 01/24/2018 revealed persistent LLL nodule. Area calcified and felt to be consistent with a granuloma. There were no other suspicious pulmonary nodules. There was new Schmorl's node formation involving the inferior endplate of T88. There were stable small scattered  osseous lesions related to underlying multiple myeloma.  PET scanon 05/15/2018 revealed nohypermetabolic lesions characteristic of active myeloma. There was stable hypermetabolic apical wall of the left ventricle, there waspotentially a small pseudoaneurysmin this vicinity, but no appreciable mass was noted on 01/24/2018. There was aninfrarenal abdominal aortic aneurysm, 3.6 cm in diameter. Follow-up ultrasound in 2 years was recommended. Other imaging findings of potential clinical significance: aortic atherosclerosis and coronary atherosclerosis.  Bone survery on01/25/2021 showed numerous new lytic lesions throughout the skeleton including the posterior calvarium, bilateral humeri, bilateral iliac bones, left inferior pubic ramus, bilateral femurs.  PET scan on 11/06/2019 revealed innumerable new lytic expansile lesions in the skeleton. Many are small and not appreciably hypermetabolic, but numerous hypermetabolic lesions are also identified especially in the sternum, clavicles, ribs, thoracolumbar spine, bony pelvis, and femurs. Index lesions with maximum SUV up to 17.2. Appearance was compatible with progressive myeloma. There was increase in size of infrarenal abdominal aortic aneurysm, currently 3.9 cm in diameter. Other imaging findings of potential clinical significance: aortic atherosclerosis, coronary atherosclerosis, lingular scarring, postoperative findings in the lumbar spine with likely chronic fracture the right L3 pedicle screw in the mid pedicle region, bilateral crossover toe, and multiple joint prostheses.  She has stage III chronic kidney disease(Cr 2.25 on 05/27/2019 and 1.38 on 11/13/2019).   She has a macrocytic anemia. B12, folate, and TSH were normal on 05/10/2018. Retic was 1.6%. Ferritin and iron studies were normal on 05/10/2018. Appetiteremains poor. Last colonoscopywas >10 years ago (she declines repeat).  She has a 75 pack year smoking history. She  has COPD. Chest CT without contraston 07/25/2017 revealed no suspicious pulmonary nodules.  Sheunderwent total right hip replacementon 07/02/2019. She required 2 units of PRBCs.   Symptomatically, she notes a burning pain in her sternum.  Energy level is "zero".  Shortness of breath is stable.  She has intermittent nausea.  Hemoglobin is 7.8.  Corrected calcium is 11.55.  She declines myeloma treatment, treatment for hypercalcemia, or transfusion support.  She would like information about Hospice.  Plan: 1.   Labs today:  CBC with diff, CMP, SPEP, FLCA 2.   Multiple myeloma  Symptomatically, she is doing poorly.  She has progressive disease with increased bone pain and hypercalcemia. M spikewas 2. 1 gm/dL today (increased). Lambda  free light [id number](ratio 0. 04) on 12/11/2019. Bone surveyon 05/20/2019 revealednumerous new lytic lesions throughout the skeleton. PET scan on 11/06/2019 revealed innumerable bone lesions. Images reviewed with patient. Bone marrow aspirateon 05/29/2019 revealed anormocellularmarrow for age (20-30%) withincreased monoclonal plasma cells (20-30% CD138IHC). Cytogenetics were normal (63, XX).  FISH for myeloma revealed a deletion of NF1 or chromosome 17q11 (2R1G, 89%, not seen in validation studies). Marrow plasmacytosis has increased from 10-15% to 20-30%since 07/2017. Discuss progressive myeloma. Patient currently not interested in myeloma treatment.  She does not wish wish treatment for her hypercalcemia. Treatment options were briefly discussed but declined.                      3. Sternal pain PET scan on 11/06/2019 confirmed lytic lesion in the sternum. He has progressive sternal pain. 4. Renal insufficiency Creatinine 1.49(CrCl 32.5 ml/min) today. Continue to monitor. 5. Macrocytic anemia Hematocrit 25.0. Hemoglobin 7.8. MCV 108.7  today. B12was 911 and folate 60.9. on 05/16/2019. Bone marrow on 02/03/2021revealed no evidence of MDS.  Discuss likely progressive marrow involvement.  Patient declines transfusion support. 6.Hypercalcemia Corrected calcium 11.55 today.  Discuss signs and symptoms of hypercalcemia.  Discuss treatment with Zometa.  Patient declines. 7.   Cancer-related pain  Rx: Tramadol 50 mg po q 6 hours prn pain (dis: #30).  Rx: ondansetron 4 mg po q 8 hours prn nausea (dis: # 60). 8.   Discuss Hospice. 9.   RN: MOST forms and living will.  Discuss CODE status. 10.   RTC in 4 weeks for MD assessment and labs (CBC with diff, CMP, SPEP, FLCA).  I discussed the assessment and treatment plan with the patient.  The patient was provided an opportunity to ask questions and all were answered.  The patient agreed with the plan and demonstrated an understanding of the instructions.  The patient was advised to call back if the symptoms worsen or if the condition fails to improve as anticipated.  I provided 36 minutes of face-to-face time during this this encounter and > 50% was spent counseling as documented under my assessment and plan.  An additional 5 minutes were spent reviewing her chart (Epic and Care Everywhere) including notes, labs, and imaging studies.    Lequita Asal, MD, PhD    12/11/2019, 6:20 PM   I, Mirian Mo Tufford, am acting as a Education administrator for [employer]. Mike Gip, MD.   I, Dorcus Riga C. Mike Gip, MD, have reviewed the above documentation for accuracy and completeness, and I agree with the above.

## 2019-12-11 ENCOUNTER — Encounter: Payer: Self-pay | Admitting: Hematology and Oncology

## 2019-12-11 ENCOUNTER — Inpatient Hospital Stay: Payer: Medicare Other | Attending: Hematology and Oncology

## 2019-12-11 ENCOUNTER — Ambulatory Visit (HOSPITAL_BASED_OUTPATIENT_CLINIC_OR_DEPARTMENT_OTHER): Payer: Medicare Other | Admitting: Hematology and Oncology

## 2019-12-11 ENCOUNTER — Other Ambulatory Visit: Payer: Self-pay

## 2019-12-11 VITALS — BP 130/60 | HR 71 | Resp 18 | Wt 172.0 lb

## 2019-12-11 DIAGNOSIS — R0789 Other chest pain: Secondary | ICD-10-CM | POA: Diagnosis not present

## 2019-12-11 DIAGNOSIS — N289 Disorder of kidney and ureter, unspecified: Secondary | ICD-10-CM

## 2019-12-11 DIAGNOSIS — C7951 Secondary malignant neoplasm of bone: Secondary | ICD-10-CM | POA: Insufficient documentation

## 2019-12-11 DIAGNOSIS — D539 Nutritional anemia, unspecified: Secondary | ICD-10-CM

## 2019-12-11 DIAGNOSIS — C9000 Multiple myeloma not having achieved remission: Secondary | ICD-10-CM | POA: Insufficient documentation

## 2019-12-11 DIAGNOSIS — I779 Disorder of arteries and arterioles, unspecified: Secondary | ICD-10-CM | POA: Diagnosis not present

## 2019-12-11 LAB — CBC WITH DIFFERENTIAL/PLATELET
Abs Immature Granulocytes: 0.05 10*3/uL (ref 0.00–0.07)
Basophils Absolute: 0 10*3/uL (ref 0.0–0.1)
Basophils Relative: 0 %
Eosinophils Absolute: 0.1 10*3/uL (ref 0.0–0.5)
Eosinophils Relative: 3 %
HCT: 25 % — ABNORMAL LOW (ref 36.0–46.0)
Hemoglobin: 7.8 g/dL — ABNORMAL LOW (ref 12.0–15.0)
Immature Granulocytes: 1 %
Lymphocytes Relative: 27 %
Lymphs Abs: 1.3 10*3/uL (ref 0.7–4.0)
MCH: 33.9 pg (ref 26.0–34.0)
MCHC: 31.2 g/dL (ref 30.0–36.0)
MCV: 108.7 fL — ABNORMAL HIGH (ref 80.0–100.0)
Monocytes Absolute: 0.8 10*3/uL (ref 0.1–1.0)
Monocytes Relative: 16 %
Neutro Abs: 2.5 10*3/uL (ref 1.7–7.7)
Neutrophils Relative %: 53 %
Platelets: 369 10*3/uL (ref 150–400)
RBC: 2.3 MIL/uL — ABNORMAL LOW (ref 3.87–5.11)
RDW: 13.6 % (ref 11.5–15.5)
WBC: 4.7 10*3/uL (ref 4.0–10.5)
nRBC: 0 % (ref 0.0–0.2)

## 2019-12-11 LAB — COMPREHENSIVE METABOLIC PANEL
ALT: 11 U/L (ref 0–44)
AST: 16 U/L (ref 15–41)
Albumin: 3 g/dL — ABNORMAL LOW (ref 3.5–5.0)
Alkaline Phosphatase: 84 U/L (ref 38–126)
Anion gap: 5 (ref 5–15)
BUN: 27 mg/dL — ABNORMAL HIGH (ref 8–23)
CO2: 23 mmol/L (ref 22–32)
Calcium: 10.7 mg/dL — ABNORMAL HIGH (ref 8.9–10.3)
Chloride: 104 mmol/L (ref 98–111)
Creatinine, Ser: 1.49 mg/dL — ABNORMAL HIGH (ref 0.44–1.00)
GFR calc Af Amer: 39 mL/min — ABNORMAL LOW (ref >60)
GFR calc non Af Amer: 33 mL/min — ABNORMAL LOW (ref >60)
Glucose, Bld: 92 mg/dL (ref 70–99)
Potassium: 3.9 mmol/L (ref 3.5–5.1)
Sodium: 132 mmol/L — ABNORMAL LOW (ref 135–145)
Total Bilirubin: <0.1 mg/dL — ABNORMAL LOW (ref 0.3–1.2)
Total Protein: 9 g/dL — ABNORMAL HIGH (ref 6.5–8.1)

## 2019-12-11 MED ORDER — TRAMADOL HCL 50 MG PO TABS: 50.0000 mg | ORAL_TABLET | Freq: Four times a day (QID) | ORAL | 0 refills | Status: DC | PRN

## 2019-12-11 NOTE — Progress Notes (Signed)
Patient reports sternum pain (6/10) and occasional nausea, diarrhea, decreased appetite, trouble sleeping.

## 2019-12-12 ENCOUNTER — Encounter: Payer: Self-pay | Admitting: Hematology and Oncology

## 2019-12-12 ENCOUNTER — Other Ambulatory Visit: Payer: Self-pay | Admitting: *Deleted

## 2019-12-12 LAB — KAPPA/LAMBDA LIGHT CHAINS
Kappa free light chain: 20.6 mg/L — ABNORMAL HIGH (ref 3.3–19.4)
Kappa, lambda light chain ratio: 0.04 — ABNORMAL LOW (ref 0.26–1.65)
Lambda free light chains: 477.1 mg/L — ABNORMAL HIGH (ref 5.7–26.3)

## 2019-12-12 MED ORDER — ONDANSETRON HCL 4 MG PO TABS: 4.0000 mg | ORAL_TABLET | Freq: Three times a day (TID) | ORAL | 1 refills | Status: AC | PRN

## 2019-12-13 LAB — PROTEIN ELECTROPHORESIS, SERUM
A/G Ratio: 0.6 — ABNORMAL LOW (ref 0.7–1.7)
Albumin ELP: 3.1 g/dL (ref 2.9–4.4)
Alpha-1-Globulin: 0.4 g/dL (ref 0.0–0.4)
Alpha-2-Globulin: 1.3 g/dL — ABNORMAL HIGH (ref 0.4–1.0)
Beta Globulin: 0.9 g/dL (ref 0.7–1.3)
Gamma Globulin: 2.3 g/dL — ABNORMAL HIGH (ref 0.4–1.8)
Globulin, Total: 4.9 g/dL — ABNORMAL HIGH (ref 2.2–3.9)
M-Spike, %: 2.1 g/dL — ABNORMAL HIGH
Total Protein ELP: 8 g/dL (ref 6.0–8.5)

## 2019-12-22 ENCOUNTER — Encounter: Payer: Self-pay | Admitting: Hematology and Oncology

## 2019-12-23 ENCOUNTER — Telehealth: Payer: Self-pay | Admitting: *Deleted

## 2019-12-23 ENCOUNTER — Telehealth: Payer: Self-pay

## 2019-12-23 NOTE — Telephone Encounter (Signed)
  Please call patient.  Myeloma does put her in the high risk for COVID.   She should receive the booster based on the timing following her last injection.  Do we have a copy of those guidelines yet?  M

## 2019-12-23 NOTE — Telephone Encounter (Signed)
Spoke with the patient to inform her that she can bring her DNR with her next appointment no extra trip is needed. I have also informed her Per Dr Mike Gip Myeloma does put her in the high risk for COVID, she should receive the booster. Which will follow the timing she had her last injection. The patient was understanding and agreeable.

## 2019-12-23 NOTE — Telephone Encounter (Addendum)
Patient called a second time and asking if she should get the COVID Vaccine Booster Please advise

## 2019-12-23 NOTE — Telephone Encounter (Signed)
Patient called asking if she needs to bring the DNR/DNI papers back now or if she can bring them to her next appointment which is on 01/08/20. Please return her call to advise

## 2019-12-24 ENCOUNTER — Other Ambulatory Visit: Payer: Self-pay | Admitting: Physician Assistant

## 2019-12-25 ENCOUNTER — Other Ambulatory Visit: Payer: Self-pay

## 2019-12-25 ENCOUNTER — Ambulatory Visit (INDEPENDENT_AMBULATORY_CARE_PROVIDER_SITE_OTHER): Payer: Medicare Other | Admitting: Vascular Surgery

## 2019-12-25 ENCOUNTER — Encounter (INDEPENDENT_AMBULATORY_CARE_PROVIDER_SITE_OTHER): Payer: Self-pay | Admitting: Vascular Surgery

## 2019-12-25 VITALS — BP 127/74 | HR 79 | Ht 65.0 in | Wt 165.0 lb

## 2019-12-25 DIAGNOSIS — I83891 Varicose veins of right lower extremities with other complications: Secondary | ICD-10-CM

## 2019-12-25 DIAGNOSIS — I83019 Varicose veins of right lower extremity with ulcer of unspecified site: Secondary | ICD-10-CM

## 2019-12-25 DIAGNOSIS — L97919 Non-pressure chronic ulcer of unspecified part of right lower leg with unspecified severity: Secondary | ICD-10-CM

## 2019-12-25 NOTE — Progress Notes (Signed)
Varicose veins of left lower extremity with inflammation (454.1  I83.10) Current Plans   Indication: Patient presents with symptomatic varicose veins of the left lower extremity.   Procedure: Sclerotherapy using hypertonic saline mixed with 1% Lidocaine was performed on the left lower extremity. Compression wraps were placed. The patient tolerated the procedure well. 

## 2020-01-01 ENCOUNTER — Ambulatory Visit: Payer: No Typology Code available for payment source | Admitting: Nurse Practitioner

## 2020-01-01 ENCOUNTER — Other Ambulatory Visit: Payer: Self-pay | Admitting: Cardiovascular Disease

## 2020-01-01 MED ORDER — METOPROLOL TARTRATE 50 MG PO TABS: ORAL_TABLET | ORAL | 3 refills | Status: AC

## 2020-01-01 NOTE — Telephone Encounter (Signed)
*  STAT* If patient is at the pharmacy, call can be transferred to refill team.   1. Which medications need to be refilled? (please list name of each medication and dose if known) metoprolol 50 mg 1 tablet twice daily  2. Which pharmacy/location (including street and city if local pharmacy) is medication to be sent to? [facility]  3. Do they need a 30 day or 90 day supply? 90 day

## 2020-01-01 NOTE — Telephone Encounter (Signed)
Refilled to [facility] , Lopressor 50 mg bid.

## 2020-01-07 NOTE — Progress Notes (Signed)
[facility] [facility]  [address], [address] [place], [place] [address] Phone: [phone]  Fax: [phone]   Clinic Day:  01/08/2020  Referring physician: Cannady, Jolene T, NP  Chief Complaint: Bailey Ayala is a 79 y.o. female with multiplemyelomawho is seen for 1 month assessment.  HPI: The patient was last seen in the medical oncology clinic on 12/11/2019. At that time, she noted a burning pain in her sternum.  Energy level was "zero".  Shortness of breath was stable.  She had intermittent nausea. She declined myeloma treatment, treatment for hypercalcemia, or transfusion support.  She received a prescription for Tramadol.  She wanted information about Hospice.   Labs at last visit revealed a hematocrit of 25.0, hemoglobin 7.8, MCV 108.7, platelets 369,000, WBC 4,700. Sodium was 132.  Creatinine was 1.49 (CrCl 33 ml/min). Calcium was 10.7 (corrected to 11.55).  Albumin was 3.0. Bilirubin was <0.1. M spike was 2.1 g/dL. Kappa free light chains were 20.6, lambda free light chains 477.1, and ratio 0.04.  During the interim, she has been "okay I guess." The worst pain is in her back and ribs. Her pain goes up to an 8 at it's worst and a 6 at baseline. She did not pick up the Tramadol and is still using Tylenol for pain relief.   Her appetite is poor. She has shortness of breath on exertion. She denies chest pain and palpitations. Her nausea and sternum pain have resolved. She stopped her calcium supplementation and does not drink milk anymore.  The patient would like to speak with Josh Borders, NP about palliative care vs. Hospice. She would like to continue getting labs at her visits.   Past Medical History:  Diagnosis Date  . AAA (abdominal aortic aneurysm) (HCC)    The largest aortic measurement is 4.0 cm on 05/31/19  . Allergy 1980  . Anemia 09-2017  . Arthritis   . Bleeding from varicose vein   . Blood dyscrasia    BLEEDS EASILY   . Blood transfusion  without reported diagnosis 2011 or 12  . Cataract ?  . CHF (congestive heart failure) (HCC) ?  . CKD (chronic kidney disease), stage III    Moderate  . Clotting disorder (HCC) ?  . COPD (chronic obstructive pulmonary disease) (HCC)   . Coronary artery disease   . Diabetes mellitus without complication (HCC)    patient denies  . Diffuse cystic mastopathy   . Emphysema of lung (HCC) ?  . Emphysema of lung (HCC)   . Heartburn   . Hyperlipidemia   . Hypertension   . Hypothyroidism   . Incomplete left bundle branch block (LBBB) 06/22/2019   Noted on EKG  . L4-L5 disc bulge   . Multiple myeloma (HCC)   . Myocardial infarction (HCC) 2019  . Osteoporosis, post-menopausal   . PAD (peripheral artery disease) (HCC)   . Renal insufficiency   . Shortness of breath dyspnea    WITH EXERTION   . Stroke (HCC)    TIA     2016     WAS FOUND ON SCAN ORDERED FROM NEUROL  . Thyroid disease   . TIA (transient ischemic attack) 2016   left hand weakness  . Ulnar nerve compression, left   . Vertigo 06/22/2019   doing much today 06/28/2019    Past Surgical History:  Procedure Laterality Date  . ABDOMINAL HYSTERECTOMY  1990  . BACK SURGERY  2011  . BREAST EXCISIONAL BIOPSY Left    2010?   .   BREAST SURGERY  1975   A lump removed left breast  . CATARACT EXTRACTION, BILATERAL    . COLONOSCOPY  1998  . ENDOSCOPIC VEIN LASER TREATMENT    . EYE SURGERY Bilateral 05/06/14  . JOINT REPLACEMENT     LT KNEE  . KNEE SURGERY Left 2012  . LEFT HEART CATH AND CORONARY ANGIOGRAPHY Left 11/19/2018   Procedure: LEFT HEART CATH AND CORONARY ANGIOGRAPHY;  Surgeon: Arida, Muhammad A, MD;  Location: [facility] INVASIVE [facility];  Service: Cardiovascular;  Laterality: Left;  . OOPHORECTOMY  1990  . parathyroid transplant     2/3 THYROID REMOVED   (GOITER)  . SPINE SURGERY    . THYROID SURGERY    . TONSILLECTOMY    . TOTAL HIP ARTHROPLASTY Right 07/02/2019   Procedure: TOTAL HIP ARTHROPLASTY ANTERIOR APPROACH;  Surgeon:  Olin, Matthew, MD;  Location: [facility];  Service: Orthopedics;  Laterality: Right;  70 mins  . TOTAL SHOULDER ARTHROPLASTY Left 01/14/2016   Procedure: LEFT TOTAL SHOULDER ARTHROPLASTY;  Surgeon: Kevin Supple, MD;  Location: [facility] OR;  Service: Orthopedics;  Laterality: Left;  . TOTAL SHOULDER ARTHROPLASTY Right 11/03/2016   Procedure: RIGHT TOTAL SHOULDER ARTHROPLASTY;  Surgeon: Supple, Kevin, MD;  Location: [facility] OR;  Service: Orthopedics;  Laterality: Right;  . ULNAR NERVE REPAIR     LEFT  . vein closure procedure Left 2010    Family History  Problem Relation Age of Onset  . Heart disease Mother        MI  . Arthritis Mother   . Stroke Mother   . Hyperlipidemia Mother   . Hypertension Mother   . Varicose Veins Mother   . Heart disease Father        MI  . Hyperlipidemia Father   . Arthritis Father   . Cancer Father   . COPD Father   . Hearing loss Father   . Hypertension Father   . Vision loss Father   . Diabetes Sister   . COPD Sister   . Heart disease Sister   . Hyperlipidemia Sister   . Hypertension Sister   . Hyperlipidemia Brother   . COPD Brother   . Diabetes Brother   . Heart disease Brother   . Hypertension Brother   . Arthritis Sister   . Hyperlipidemia Sister   . Hypertension Sister   . Vision loss Sister   . Varicose Veins Sister   . Arthritis Brother   . Varicose Veins Brother   . Arthritis Daughter   . Arthritis Daughter   . Diabetes Daughter   . Cancer Paternal Aunt   . COPD Sister   . Heart disease Sister   . Hyperlipidemia Sister   . Hypertension Sister   . Varicose Veins Sister   . Heart disease Maternal Uncle   . Heart disease Paternal Uncle     Social History:  reports that she quit smoking about 30 years ago. Her smoking use included cigarettes. She has a 75.00 pack-year smoking history. She has never used smokeless tobacco. She reports previous alcohol use. She reports that she does not use drugs. Patient is a former 3 pack per day smoker for 25  years (75 pack year). Patient lives in [place]. She is a former house cleaner. Patient denies known exposures to radiation on toxins. The patient is accompanied by one daughter in person and another daughter (Bailey Ayala) by phone today.   Allergies:  Allergies  Allergen Reactions  . Oxycodone Other (See Comments)      Made her feel "shaky"  . Codeine Other (See Comments)    hallucinations    . Simvastatin Diarrhea and Nausea Only    Current Medications: Current Outpatient Medications  Medication Sig Dispense Refill  . acetaminophen (TYLENOL) 325 MG tablet Take 650 mg by mouth every 6 (six) hours as needed.    . atorvastatin (LIPITOR) 40 MG tablet Take 1 tablet (40 mg total) by mouth daily. 90 tablet 4  . benazepril (LOTENSIN) 20 MG tablet Take 1 tablet (20 mg total) by mouth daily. 90 tablet 4  . diclofenac Sodium (VOLTAREN) 1 % GEL Apply topically as needed.     . ferrous sulfate 325 (65 FE) MG tablet Take 325 mg by mouth daily with breakfast.    . levothyroxine (SYNTHROID) 100 MCG tablet Take 1 tablet (100 mcg total) by mouth daily. 90 tablet 4  . meclizine (ANTIVERT) 25 MG tablet Take 1 tablet (25 mg total) by mouth 2 (two) times daily as needed for dizziness. 15 tablet 0  . metoprolol tartrate (LOPRESSOR) 50 MG tablet TAKE 1 TABLET(50 MG) BY MOUTH TWICE DAILY 180 tablet 3  . ondansetron (ZOFRAN) 4 MG tablet Take 1 tablet (4 mg total) by mouth every 8 (eight) hours as needed for nausea or vomiting. 60 tablet 1  . triamcinolone cream (KENALOG) 0.1 % Apply 1 application topically 2 (two) times daily. 30 g 0  . diclofenac Sodium (VOLTAREN) 1 % GEL Apply 2 g topically 4 (four) times daily. 50 g 0  . ferrous sulfate (FERROUSUL) 325 (65 FE) MG tablet Take 1 tablet (325 mg total) by mouth 3 (three) times daily with meals for 14 days. (Patient taking differently: Take 325 mg by mouth daily with breakfast. ) 42 tablet 0  . Multiple Vitamin (MULTIVITAMIN WITH MINERALS) TABS tablet Take 1 tablet by mouth  daily. (Patient not taking: Reported on 12/25/2019)    . traMADol (ULTRAM) 50 MG tablet Take 1 tablet (50 mg total) by mouth every 6 (six) hours as needed for moderate pain. 30 tablet 0   No current facility-administered medications for this visit.    Review of Systems  Constitutional: Positive for weight loss (4 lbs). Negative for chills, diaphoresis, fever and malaise/fatigue.       Feels "ok".  HENT: Negative for congestion, ear discharge, ear pain, hearing loss, nosebleeds, sinus pain, sore throat and tinnitus.   Eyes: Negative for blurred vision and double vision.  Respiratory: Positive for cough (chronic; stable) and shortness of breath (on exertion). Negative for hemoptysis and sputum production.        COPD  Cardiovascular: Negative for chest pain, palpitations and leg swelling.  Gastrointestinal: Positive for diarrhea (chronic). Negative for abdominal pain, blood in stool, constipation, heartburn, melena, nausea and vomiting.       Poor appetite.  Genitourinary: Negative for dysuria, frequency and urgency.  Musculoskeletal: Positive for back pain and joint pain (ribs). Negative for falls, myalgias and neck pain.  Skin: Negative for itching and rash.  Neurological: Negative for dizziness, tingling, sensory change, weakness and headaches.       Aneurysm, near syncope episode on 06/22/2019.  Endo/Heme/Allergies: Negative for environmental allergies. Does not bruise/bleed easily.       Hypothyroidism - on levothyroxine.   Psychiatric/Behavioral: Negative for depression and memory loss. The patient is not nervous/anxious and does not have insomnia.   All other systems reviewed and are negative.  Performance status (ECOG): 2  Vitals Blood pressure (!) 152/49, pulse (!) 59, resp. rate 18,   weight 167 lb 8.8 oz (76 kg), SpO2 99 %.   Physical Exam Vitals and nursing note reviewed.  Constitutional:      General: She is not in acute distress.    Appearance: She is not diaphoretic.      Interventions: Face mask in place.     Comments: Fatigued appearing woman sitting comfortably in the exam room in no acute distress.  She has a cane by her side.  HENT:     Head: Normocephalic and atraumatic.     Comments: Shoulder length brown hair.    Mouth/Throat:     Mouth: Mucous membranes are moist.     Pharynx: Oropharynx is clear.  Eyes:     General: No scleral icterus.    Extraocular Movements: Extraocular movements intact.     Conjunctiva/sclera: Conjunctivae normal.     Pupils: Pupils are equal, round, and reactive to light.     Comments: Blue eyes.  Cardiovascular:     Rate and Rhythm: Normal rate and regular rhythm.     Heart sounds: Normal heart sounds. No murmur heard.   Pulmonary:     Effort: Pulmonary effort is normal. No respiratory distress.     Breath sounds: Normal breath sounds. No wheezing or rales.  Chest:     Chest wall: No tenderness.  Abdominal:     General: Bowel sounds are normal. There is no distension.     Palpations: Abdomen is soft. There is no hepatomegaly, splenomegaly or mass.     Tenderness: There is no abdominal tenderness. There is no guarding or rebound.  Musculoskeletal:        General: No swelling or tenderness. Normal range of motion.     Cervical back: Normal range of motion and neck supple.  Lymphadenopathy:     Head:     Right side of head: No preauricular, posterior auricular or occipital adenopathy.     Left side of head: No preauricular, posterior auricular or occipital adenopathy.     Cervical: No cervical adenopathy.     Upper Body:     Right upper body: No supraclavicular or axillary adenopathy.     Left upper body: No supraclavicular or axillary adenopathy.     Lower Body: No right inguinal adenopathy. No left inguinal adenopathy.  Skin:    General: Skin is warm and dry.  Neurological:     Mental Status: She is alert and oriented to person, place, and time. Mental status is at baseline.  Psychiatric:        Behavior:  Behavior normal.        Thought Content: Thought content normal.        Judgment: Judgment normal.    Appointment on 01/08/2020  Component Date Value Ref Range Status  . Sodium 01/08/2020 133* 135 - 145 mmol/L Final  . Potassium 01/08/2020 4.0  3.5 - 5.1 mmol/L Final  . Chloride 01/08/2020 103  98 - 111 mmol/L Final  . CO2 01/08/2020 23  22 - 32 mmol/L Final  . Glucose, Bld 01/08/2020 89  70 - 99 mg/dL Final   Glucose reference range applies only to samples taken after fasting for at least 8 hours.  . BUN 01/08/2020 23  8 - 23 mg/dL Final  . Creatinine, Ser 01/08/2020 1.32* 0.44 - 1.00 mg/dL Final  . Calcium 01/08/2020 9.7  8.9 - 10.3 mg/dL Final  . Total Protein 01/08/2020 9.6* 6.5 - 8.1 g/dL Final  . Albumin 01/08/2020 3.8  3.5 - 5.0 g/dL   Final  . AST 01/08/2020 15  15 - 41 U/L Final  . ALT 01/08/2020 9  0 - 44 U/L Final  . Alkaline Phosphatase 01/08/2020 72  38 - 126 U/L Final  . Total Bilirubin 01/08/2020 0.2* 0.3 - 1.2 mg/dL Final  . GFR calc non Af Amer 01/08/2020 39* >60 mL/min Final  . GFR calc Af Amer 01/08/2020 45* >60 mL/min Final  . Anion gap 01/08/2020 7  5 - 15 Final   Performed at [facility], [address] [place], [place] [address]  . WBC 01/08/2020 4.5  4.0 - 10.5 K/uL Final  . RBC 01/08/2020 2.55* 3.87 - 5.11 MIL/uL Final  . Hemoglobin 01/08/2020 8.6* 12.0 - 15.0 g/dL Final  . HCT 01/08/2020 27.7* 36 - 46 % Final  . MCV 01/08/2020 108.6* 80.0 - 100.0 fL Final  . MCH 01/08/2020 33.7  26.0 - 34.0 pg Final  . MCHC 01/08/2020 31.0  30.0 - 36.0 g/dL Final  . RDW 01/08/2020 15.3  11.5 - 15.5 % Final  . Platelets 01/08/2020 274  150 - 400 K/uL Final  . nRBC 01/08/2020 0.0  0.0 - 0.2 % Final  . Neutrophils Relative % 01/08/2020 57  % Final  . Neutro Abs 01/08/2020 2.6  1.7 - 7.7 K/uL Final  . Lymphocytes Relative 01/08/2020 28  % Final  . Lymphs Abs 01/08/2020 1.3  0.7 - 4.0 K/uL Final  . Monocytes Relative 01/08/2020 11  % Final  . Monocytes  Absolute 01/08/2020 0.5  0 - 1 K/uL Final  . Eosinophils Relative 01/08/2020 4  % Final  . Eosinophils Absolute 01/08/2020 0.2  0 - 0 K/uL Final  . Basophils Relative 01/08/2020 0  % Final  . Basophils Absolute 01/08/2020 0.0  0 - 0 K/uL Final  . Immature Granulocytes 01/08/2020 0  % Final  . Abs Immature Granulocytes 01/08/2020 0.02  0.00 - 0.07 K/uL Final   Performed at [facility], [address] [place],  [address]    Assessment:  Derrian A Alldredge is a 79 y.o. female with active multiple myeloma. SPEPon 06/21/2017 revealed a 1.2 gm/dL monoclonal spike. Random urine revealed 4.6 mg/dL protein and a 40.6% M-spike. Calcium, albumin, and serum protein were normal.  Work-up on 03/08/2019revealed a hematocrit of 31.1, hemoglobin 10.4, MCV 97.4, platelets 227,000, WBC 5700 with an ANC of 3300. SPEPrevealed a 1.1 gm/dL IgG monoclonal protein with lambda light chain specificity and monoclonal free lambda light chains(Bence-Jones protein). IgG was 1660 (700-1600) and IgA was 48 (64-422). Kappa free light chains were 23.7, lambda free light chains were 142.2 and ratio 0.17 (0.26-1.65). Beta2-microglobulin was 4.7 (0.6-2.4). Normal studies included: B12, folate, and ferritin (75). TSHwas 0.204 (low). 24 hour UPEPrevealed kappa free light chains 28.80 mg/L, lambda free light chains 63.80 mg/L and ration 0.45 (2.04 - 10.37). M spike was 27.4% (16 mg/24 hours).  Bone surveyon 06/30/2017 revealed no suspicious focal bony lesions.  Bone marrow aspirate and biopsyon 07/26/2017 revealed a slightly hypercellular marrow for age with trilineage hematopoiesis. There was 13% plasmacytosis(10-15% byCD138IHC). Plasma cells were ininterstitial cells and small clusters. Lambda light chains appeared restricted. Features were felt compatible with plasma cell neoplasm.  Bone marrow aspirate and biopsy on 05/29/2019 revealed a normocellularmarrow for age (20-30%)  withincreased monoclonal plasma cells (15% aspirate, 20-30% CD138by IHC).Plasma cells were lambda restricted.There were no myelodysplastic changes.Storage iron was present. Flow cytometry revealed no monoclonal B-cell or phenotypically aberrant T-cell population. Findings were consistent with persistent plasma   cell myeloma.Peripheral bloodrevealed amacrocytic anemia with rouleaux formation.Cytogenetics were normal (54, XX). FISH for MDSrevealed gain of KMT2A (MLL) or chromosome 11/11q (39F.5.0%, normal <3.1%).FISH for myeloma revealed a deletion of NF1 or chromosome 17q11 (2R1G, 89%, not seen in validation studies).  PET scanon 08/14/2017 revealed no hypermetabolic lesions of myeloma. There was a 3.6 cm infrarenal aortic aneurysm. Ultrasound in 2 years was recommended. She had a 4 mm LLL noduletoo small to characterize.  SPEPhas been followed: 1.1 on 06/30/2017, 1.2 on 11/14/2017, 1.3 on 01/26/2018, 1.2 on 05/10/2018, 1.4 on 08/08/2018, 1.2 on 11/09/2018, 1.4 on 02/11/2019, 1.6 on 05/09/2019, 1.6 on 08/07/2019, 1.8 on 10/21/2019, 2.0 on 11/13/2019,  2.1 on 12/11/2019 and 2.6 on 01/08/2020. IgGhas been followed: 1660 on 06/30/2017, 1684 on 11/14/2017, 1795 on 01/26/2018, and 2054 on 08/08/2018.  Lambda free light chainshave been followed:142.2 (ratio 0.17)on03/11/2017, 214.4(ratio 0.09)on01/16/2020, 209.3(ratio 0.08)on04/14/2020, 220.2(ratio 0.07)on07/17/2020, 228.9(ratio 0.08)on 02/11/2019,258.4 (ratio 0.07)on01/14/2021, 341.6 (ratio 0.05), 295.1 (ratio 0.05) on 10/21/2019, 477.1 (ratio 0.04) on 12/11/2019, and 577.9 (ratio 0.03) on 01/08/2020.  Chest CTon 01/24/2018 revealed persistent LLL nodule. Area calcified and felt to be consistent with a granuloma. There were no other suspicious pulmonary nodules. There was new Schmorl's node formation involving the inferior endplate of X90. There were stable small scattered osseous lesions related to underlying  multiple myeloma.  PET scanon 05/15/2018 revealed nohypermetabolic lesions characteristic of active myeloma. There was stable hypermetabolic apical wall of the left ventricle, there waspotentially a small pseudoaneurysmin this vicinity, but no appreciable mass was noted on 01/24/2018. There was aninfrarenal abdominal aortic aneurysm, 3.6 cm in diameter. Follow-up ultrasound in 2 years was recommended. Other imaging findings of potential clinical significance: aortic atherosclerosis and coronary atherosclerosis.  Bone survery on01/25/2021 showed numerous new lytic lesions throughout the skeleton including the posterior calvarium, bilateral humeri, bilateral iliac bones, left inferior pubic ramus, bilateral femurs.  PET scan on 11/06/2019 revealed innumerable new lytic expansile lesions in the skeleton. Many are small and not appreciably hypermetabolic, but numerous hypermetabolic lesions are also identified especially in the sternum, clavicles, ribs, thoracolumbar spine, bony pelvis, and femurs. Index lesions with maximum SUV up to 17.2. Appearance was compatible with progressive myeloma. There was increase in size of infrarenal abdominal aortic aneurysm, currently 3.9 cm in diameter. Other imaging findings of potential clinical significance: aortic atherosclerosis, coronary atherosclerosis, lingular scarring, postoperative findings in the lumbar spine with likely chronic fracture the right L3 pedicle screw in the mid pedicle region, bilateral crossover toe, and multiple joint prostheses.  She has stage III chronic kidney disease(Cr 2.25 on 05/27/2019 and 1.38 on 11/13/2019).   She has a macrocytic anemia. B12, folate, and TSH were normal on 05/10/2018. Retic was 1.6%. Ferritin and iron studies were normal on 05/10/2018. Appetiteremains poor. Last colonoscopywas >10 years ago (she declines repeat).  She has a 75 pack year smoking history. She has COPD. Chest CT without  contraston 07/25/2017 revealed no suspicious pulmonary nodules.  Sheunderwent total right hip replacementon 07/02/2019. She required 2 units of PRBCs.   Symptomatically, she feels "ok".  She has back and rib pain.  She is not taking her tramadol.  Appetite is decreased.  She has lost weight.  Plan: 1.   Labs today:  CBC with diff, CMP, SPEP, FLCA 2.   Multiple myeloma  Symptomatically, she continues to do poorly.  She has progressive disease with increased bone pain and hypercalcemia.   She has declined treatment. M spikewas 2.6 gm/dL today (increased). Lambda free light [id number](ratio 1.03) on 01/08/2020. Bone  surveyon 05/20/2019 revealednumerous new lytic lesions throughout the skeleton. PET scan on 11/06/2019 revealed innumerable bone lesions Bone marrow aspirateon 05/29/2019 revealed increased monoclonal plasma cells (20-30% CD138IHC). She continues to have progressive multiple myeloma.  She remains adamant about no treatment.  She does not wish treatment for her hypercalcemia.   She has eliminated calcium rich foods from her diet. Continue supportive care.                      3. Bone metastasis PET scan on 11/06/2019 confirmed lytic lesion in the sternum. Patient notes both back and rib pain. She is currently using Tylenol.   Encourage use of tramadol. 4. Renal insufficiency Creatinine 1.32(CrCl 35.8 ml/min) today. Continue to monitor. 5. Macrocytic anemia Hematocrit 27.7. Hemoglobin 8.6. MCV 108.6 today. B12was 911 and folate 60.9. on 05/16/2019. Bone marrow on 02/03/2021revealed no evidence of MDS.  Patient declines transfusion support. 6.Hypercalcemia Corrected calcium 9.7 (improved) today.  Patient declines treatment for hypercalcemia if calcium increases again. 7.   Cancer-related pain  Patient currently using Tylenol.  Encourage use of tramadol as needed  for severe pain. 8.   Palliative care  Patient wishes a comfort care measures only.  Discuss referral to Joshua Borders, NP.   Patient in agreement.  Copy of patient's forms (MOST, medical power of attorney, living will).  Patient wishes to continue follow-up on an every month basis.  Multiple questions asked and answered. 9.   Palliative care (Joshua Borders, NP) consult. 10.   RTC in 4 weeks for MD assessment and labs (CBC with diff, CMP, SPEP).  I discussed the assessment and treatment plan with the patient.  The patient was provided an opportunity to ask questions and all were answered.  The patient agreed with the plan and demonstrated an understanding of the instructions.  The patient was advised to call back if the symptoms worsen or if the condition fails to improve as anticipated.  I provided 27 minutes of face-to-face time during this this encounter and > 50% was spent counseling as documented under my assessment and plan.   Melissa C Corcoran, MD, PhD    12/11/2019, 3:46 PM   I, Emily J Tufford, am acting as a scribe for Melissa C. Corcoran, MD.   I, Melissa C. Corcoran, MD, have reviewed the above documentation for accuracy and completeness, and I agree with the above.  

## 2020-01-08 ENCOUNTER — Other Ambulatory Visit: Payer: Self-pay

## 2020-01-08 ENCOUNTER — Encounter: Payer: Self-pay | Admitting: Hematology and Oncology

## 2020-01-08 ENCOUNTER — Inpatient Hospital Stay: Payer: Medicare Other

## 2020-01-08 ENCOUNTER — Inpatient Hospital Stay: Payer: Medicare Other | Attending: Hematology and Oncology | Admitting: Hematology and Oncology

## 2020-01-08 VITALS — BP 152/49 | HR 59 | Resp 18 | Wt 167.5 lb

## 2020-01-08 DIAGNOSIS — I509 Heart failure, unspecified: Secondary | ICD-10-CM | POA: Insufficient documentation

## 2020-01-08 DIAGNOSIS — J449 Chronic obstructive pulmonary disease, unspecified: Secondary | ICD-10-CM | POA: Insufficient documentation

## 2020-01-08 DIAGNOSIS — I714 Abdominal aortic aneurysm, without rupture: Secondary | ICD-10-CM | POA: Insufficient documentation

## 2020-01-08 DIAGNOSIS — C7951 Secondary malignant neoplasm of bone: Secondary | ICD-10-CM | POA: Diagnosis not present

## 2020-01-08 DIAGNOSIS — E1151 Type 2 diabetes mellitus with diabetic peripheral angiopathy without gangrene: Secondary | ICD-10-CM | POA: Diagnosis not present

## 2020-01-08 DIAGNOSIS — C9000 Multiple myeloma not having achieved remission: Secondary | ICD-10-CM | POA: Diagnosis not present

## 2020-01-08 DIAGNOSIS — E1122 Type 2 diabetes mellitus with diabetic chronic kidney disease: Secondary | ICD-10-CM | POA: Diagnosis not present

## 2020-01-08 DIAGNOSIS — E785 Hyperlipidemia, unspecified: Secondary | ICD-10-CM | POA: Diagnosis not present

## 2020-01-08 DIAGNOSIS — Z515 Encounter for palliative care: Secondary | ICD-10-CM | POA: Diagnosis not present

## 2020-01-08 DIAGNOSIS — E039 Hypothyroidism, unspecified: Secondary | ICD-10-CM | POA: Insufficient documentation

## 2020-01-08 DIAGNOSIS — E1136 Type 2 diabetes mellitus with diabetic cataract: Secondary | ICD-10-CM | POA: Insufficient documentation

## 2020-01-08 DIAGNOSIS — I779 Disorder of arteries and arterioles, unspecified: Secondary | ICD-10-CM | POA: Diagnosis not present

## 2020-01-08 DIAGNOSIS — Z79899 Other long term (current) drug therapy: Secondary | ICD-10-CM | POA: Insufficient documentation

## 2020-01-08 DIAGNOSIS — I2510 Atherosclerotic heart disease of native coronary artery without angina pectoris: Secondary | ICD-10-CM | POA: Insufficient documentation

## 2020-01-08 DIAGNOSIS — Z87891 Personal history of nicotine dependence: Secondary | ICD-10-CM | POA: Diagnosis not present

## 2020-01-08 DIAGNOSIS — Z7189 Other specified counseling: Secondary | ICD-10-CM

## 2020-01-08 DIAGNOSIS — Z8673 Personal history of transient ischemic attack (TIA), and cerebral infarction without residual deficits: Secondary | ICD-10-CM | POA: Diagnosis not present

## 2020-01-08 DIAGNOSIS — I130 Hypertensive heart and chronic kidney disease with heart failure and stage 1 through stage 4 chronic kidney disease, or unspecified chronic kidney disease: Secondary | ICD-10-CM | POA: Diagnosis not present

## 2020-01-08 DIAGNOSIS — I252 Old myocardial infarction: Secondary | ICD-10-CM | POA: Insufficient documentation

## 2020-01-08 DIAGNOSIS — D539 Nutritional anemia, unspecified: Secondary | ICD-10-CM | POA: Diagnosis not present

## 2020-01-08 DIAGNOSIS — M1990 Unspecified osteoarthritis, unspecified site: Secondary | ICD-10-CM | POA: Diagnosis not present

## 2020-01-08 DIAGNOSIS — N183 Chronic kidney disease, stage 3 unspecified: Secondary | ICD-10-CM | POA: Insufficient documentation

## 2020-01-08 DIAGNOSIS — G893 Neoplasm related pain (acute) (chronic): Secondary | ICD-10-CM | POA: Insufficient documentation

## 2020-01-08 LAB — COMPREHENSIVE METABOLIC PANEL
ALT: 9 U/L (ref 0–44)
AST: 15 U/L (ref 15–41)
Albumin: 3.8 g/dL (ref 3.5–5.0)
Alkaline Phosphatase: 72 U/L (ref 38–126)
Anion gap: 7 (ref 5–15)
BUN: 23 mg/dL (ref 8–23)
CO2: 23 mmol/L (ref 22–32)
Calcium: 9.7 mg/dL (ref 8.9–10.3)
Chloride: 103 mmol/L (ref 98–111)
Creatinine, Ser: 1.32 mg/dL — ABNORMAL HIGH (ref 0.44–1.00)
GFR calc Af Amer: 45 mL/min — ABNORMAL LOW (ref >60)
GFR calc non Af Amer: 39 mL/min — ABNORMAL LOW (ref >60)
Glucose, Bld: 89 mg/dL (ref 70–99)
Potassium: 4 mmol/L (ref 3.5–5.1)
Sodium: 133 mmol/L — ABNORMAL LOW (ref 135–145)
Total Bilirubin: 0.2 mg/dL — ABNORMAL LOW (ref 0.3–1.2)
Total Protein: 9.6 g/dL — ABNORMAL HIGH (ref 6.5–8.1)

## 2020-01-08 LAB — CBC WITH DIFFERENTIAL/PLATELET
Abs Immature Granulocytes: 0.02 10*3/uL (ref 0.00–0.07)
Basophils Absolute: 0 10*3/uL (ref 0.0–0.1)
Basophils Relative: 0 %
Eosinophils Absolute: 0.2 10*3/uL (ref 0.0–0.5)
Eosinophils Relative: 4 %
HCT: 27.7 % — ABNORMAL LOW (ref 36.0–46.0)
Hemoglobin: 8.6 g/dL — ABNORMAL LOW (ref 12.0–15.0)
Immature Granulocytes: 0 %
Lymphocytes Relative: 28 %
Lymphs Abs: 1.3 10*3/uL (ref 0.7–4.0)
MCH: 33.7 pg (ref 26.0–34.0)
MCHC: 31 g/dL (ref 30.0–36.0)
MCV: 108.6 fL — ABNORMAL HIGH (ref 80.0–100.0)
Monocytes Absolute: 0.5 10*3/uL (ref 0.1–1.0)
Monocytes Relative: 11 %
Neutro Abs: 2.6 10*3/uL (ref 1.7–7.7)
Neutrophils Relative %: 57 %
Platelets: 274 10*3/uL (ref 150–400)
RBC: 2.55 MIL/uL — ABNORMAL LOW (ref 3.87–5.11)
RDW: 15.3 % (ref 11.5–15.5)
WBC: 4.5 10*3/uL (ref 4.0–10.5)
nRBC: 0 % (ref 0.0–0.2)

## 2020-01-08 NOTE — Progress Notes (Signed)
PATIENT REPORTS PAIN IN BACK, STERNUM, CHEST, AND RIBS (6/10) AND DIARRHEA. SHE FEELS TIRED TODAY.

## 2020-01-09 LAB — KAPPA/LAMBDA LIGHT CHAINS
Kappa free light chain: 17.3 mg/L (ref 3.3–19.4)
Kappa, lambda light chain ratio: 0.03 — ABNORMAL LOW (ref 0.26–1.65)
Lambda free light chains: 577.9 mg/L — ABNORMAL HIGH (ref 5.7–26.3)

## 2020-01-09 LAB — PROTEIN ELECTROPHORESIS, SERUM
A/G Ratio: 0.8 (ref 0.7–1.7)
Albumin ELP: 4 g/dL (ref 2.9–4.4)
Alpha-1-Globulin: 0.3 g/dL (ref 0.0–0.4)
Alpha-2-Globulin: 1.1 g/dL — ABNORMAL HIGH (ref 0.4–1.0)
Beta Globulin: 1 g/dL (ref 0.7–1.3)
Gamma Globulin: 2.8 g/dL — ABNORMAL HIGH (ref 0.4–1.8)
Globulin, Total: 5.2 g/dL — ABNORMAL HIGH (ref 2.2–3.9)
M-Spike, %: 2.6 g/dL — ABNORMAL HIGH
Total Protein ELP: 9.2 g/dL — ABNORMAL HIGH (ref 6.0–8.5)

## 2020-01-10 ENCOUNTER — Encounter: Payer: Self-pay | Admitting: Emergency Medicine

## 2020-01-10 ENCOUNTER — Ambulatory Visit: Payer: Self-pay | Admitting: *Deleted

## 2020-01-10 ENCOUNTER — Telehealth: Payer: Self-pay

## 2020-01-10 ENCOUNTER — Emergency Department: Payer: Medicare Other

## 2020-01-10 ENCOUNTER — Other Ambulatory Visit: Payer: Self-pay

## 2020-01-10 ENCOUNTER — Emergency Department
Admission: EM | Admit: 2020-01-10 | Discharge: 2020-01-10 | Disposition: A | Discharge status: Home / Self Care | Payer: Medicare Other | Attending: Emergency Medicine | Admitting: Emergency Medicine

## 2020-01-10 DIAGNOSIS — R0789 Other chest pain: Secondary | ICD-10-CM

## 2020-01-10 DIAGNOSIS — R079 Chest pain, unspecified: Secondary | ICD-10-CM | POA: Diagnosis not present

## 2020-01-10 DIAGNOSIS — I130 Hypertensive heart and chronic kidney disease with heart failure and stage 1 through stage 4 chronic kidney disease, or unspecified chronic kidney disease: Secondary | ICD-10-CM | POA: Insufficient documentation

## 2020-01-10 DIAGNOSIS — I2510 Atherosclerotic heart disease of native coronary artery without angina pectoris: Secondary | ICD-10-CM | POA: Diagnosis not present

## 2020-01-10 DIAGNOSIS — S2222XA Fracture of body of sternum, initial encounter for closed fracture: Secondary | ICD-10-CM | POA: Diagnosis not present

## 2020-01-10 DIAGNOSIS — Z79899 Other long term (current) drug therapy: Secondary | ICD-10-CM | POA: Diagnosis not present

## 2020-01-10 DIAGNOSIS — Z96641 Presence of right artificial hip joint: Secondary | ICD-10-CM | POA: Insufficient documentation

## 2020-01-10 DIAGNOSIS — J432 Centrilobular emphysema: Secondary | ICD-10-CM | POA: Diagnosis not present

## 2020-01-10 DIAGNOSIS — E039 Hypothyroidism, unspecified: Secondary | ICD-10-CM | POA: Insufficient documentation

## 2020-01-10 DIAGNOSIS — Y92009 Unspecified place in unspecified non-institutional (private) residence as the place of occurrence of the external cause: Secondary | ICD-10-CM | POA: Diagnosis not present

## 2020-01-10 DIAGNOSIS — E119 Type 2 diabetes mellitus without complications: Secondary | ICD-10-CM | POA: Diagnosis not present

## 2020-01-10 DIAGNOSIS — C9001 Multiple myeloma in remission: Secondary | ICD-10-CM | POA: Diagnosis not present

## 2020-01-10 DIAGNOSIS — Z8616 Personal history of COVID-19: Secondary | ICD-10-CM | POA: Insufficient documentation

## 2020-01-10 DIAGNOSIS — Z96611 Presence of right artificial shoulder joint: Secondary | ICD-10-CM | POA: Insufficient documentation

## 2020-01-10 DIAGNOSIS — N183 Chronic kidney disease, stage 3 unspecified: Secondary | ICD-10-CM | POA: Diagnosis not present

## 2020-01-10 DIAGNOSIS — M898X8 Other specified disorders of bone, other site: Secondary | ICD-10-CM | POA: Diagnosis not present

## 2020-01-10 DIAGNOSIS — X501XXA Overexertion from prolonged static or awkward postures, initial encounter: Secondary | ICD-10-CM | POA: Diagnosis not present

## 2020-01-10 DIAGNOSIS — I509 Heart failure, unspecified: Secondary | ICD-10-CM | POA: Diagnosis not present

## 2020-01-10 DIAGNOSIS — J449 Chronic obstructive pulmonary disease, unspecified: Secondary | ICD-10-CM | POA: Diagnosis not present

## 2020-01-10 DIAGNOSIS — C9000 Multiple myeloma not having achieved remission: Secondary | ICD-10-CM

## 2020-01-10 DIAGNOSIS — R071 Chest pain on breathing: Secondary | ICD-10-CM | POA: Diagnosis not present

## 2020-01-10 DIAGNOSIS — S299XXA Unspecified injury of thorax, initial encounter: Secondary | ICD-10-CM | POA: Diagnosis present

## 2020-01-10 MED ORDER — TRAMADOL HCL 50 MG PO TABS: 50.0000 mg | ORAL_TABLET | Freq: Four times a day (QID) | ORAL | 0 refills | Status: AC | PRN

## 2020-01-10 NOTE — Telephone Encounter (Signed)
Left a message to inform the patient that her M-spike was increaseing from 2.1 to 2.6. I have also inform her if she have any question to please feel free to contact the office.

## 2020-01-10 NOTE — ED Provider Notes (Signed)
[facility] Emergency Department Provider Note   ____________________________________________    I have reviewed the triage vital signs and the nursing notes.   HISTORY  Chief Complaint Chest Pain (left rib pain)     HPI Bailey Ayala is a 79 y.o. female with a history of multiple myeloma, diabetes, CAD, CHF, CKD presents with complaints of pain to her chest wall underneath her left breast.  She reports this started suddenly while sitting in her computer chair at home 2 days ago.  She denies injury to the area.  No falls.  She has had occasional cough which can cause her pain, she wonders if this may have injured her rib because of multiple myeloma lesions.  No shortness of breath.  No fevers or chills.  No rash.  Past Medical History:  Diagnosis Date  . AAA (abdominal aortic aneurysm) ([place])    The largest aortic measurement is 4.0 cm on 05/31/19  . Allergy 1980  . Anemia 09-2017  . Arthritis   . Bleeding from varicose vein   . Blood dyscrasia    BLEEDS EASILY   . Blood transfusion without reported diagnosis 2011 or 12  . Cataract ?  . CHF (congestive heart failure) ([place]) ?  . CKD (chronic kidney disease), stage III    Moderate  . Clotting disorder ([place]) ?  Marland Kitchen COPD (chronic obstructive pulmonary disease) ([place])   . Coronary artery disease   . Diabetes mellitus without complication ([place])    patient denies  . Diffuse cystic mastopathy   . Emphysema of lung ([place]) ?  Marland Kitchen Emphysema of lung ([place])   . Heartburn   . Hyperlipidemia   . Hypertension   . Hypothyroidism   . Incomplete left bundle branch block (LBBB) 06/22/2019   Noted on EKG  . L4-L5 disc bulge   . Multiple myeloma ([place])   . Myocardial infarction ([place]) 2019  . Osteoporosis, post-menopausal   . PAD (peripheral artery disease) ([place])   . Renal insufficiency   . Shortness of breath dyspnea    WITH EXERTION   . Stroke ([place])    TIA     2016     WAS FOUND ON SCAN ORDERED FROM NEUROL    . Thyroid disease   . TIA (transient ischemic attack) 2016   left hand weakness  . Ulnar nerve compression, left   . Vertigo 06/22/2019   doing much today 06/28/2019    Patient Active Problem List   Diagnosis Date Noted  . Cancer-related pain 01/08/2020  . Hypercalcemia 01/08/2020  . Bone metastasis ([place]) 11/13/2019  . Renal insufficiency 11/13/2019  . Hemorrhage of varicose veins of right lower extremity 10/30/2019  . Sternal pain 10/27/2019  . Intercostal pain 09/24/2019  . Vertigo 07/22/2019  . Overweight (BMI 25.0-29.9) 07/03/2019  . S/P right THA, AA 07/02/2019  . History of COVID-19 04/11/2019  . Anemia in chronic kidney disease 01/18/2019  . Chronic kidney disease, stage III (moderate) 01/18/2019  . Multiple myeloma ([place]) 01/18/2019  . History of CVA (cerebrovascular accident) 11/28/2018  . CAD (coronary artery disease) 11/28/2018  . Pain due to onychomycosis of toenails of both feet 10/18/2018  . Left lower lobe pulmonary nodule 08/18/2017  . Abdominal aortic aneurysm (AAA) without rupture ([place]) 08/18/2017  . Goals of care, counseling/discussion 08/18/2017  . Macrocytic anemia 06/30/2017  . Benign hypertensive heart and CKD, stage 3 (GFR 30-59), w CHF ([place]) 05/29/2017  . Drug-induced constipation 11/07/2016  . Primary osteoarthritis involving  multiple joints 11/07/2016  . Simple chronic bronchitis ([place]) 11/07/2016  . Advanced care planning/counseling discussion 11/02/2016  . S/P shoulder replacement 01/14/2016  . Hypothyroid 11/04/2014  . Hyperlipidemia   . Ulcerated varicose veins of leg, right ([place]) 09/30/2014  . Peripheral arterial occlusive disease ([place]) 09/30/2014    Past Surgical History:  Procedure Laterality Date  . ABDOMINAL HYSTERECTOMY  1990  . BACK SURGERY  2011  . BREAST EXCISIONAL BIOPSY Left    2010?   Marland Kitchen BREAST SURGERY  1975   A lump removed left breast  . CATARACT EXTRACTION, BILATERAL    . COLONOSCOPY  1998  . ENDOSCOPIC VEIN LASER  TREATMENT    . EYE SURGERY Bilateral 05/06/14  . JOINT REPLACEMENT     LT KNEE  . KNEE SURGERY Left 2012  . LEFT HEART CATH AND CORONARY ANGIOGRAPHY Left 11/19/2018   Procedure: LEFT HEART CATH AND CORONARY ANGIOGRAPHY;  Surgeon: Wellington Hampshire, MD;  Location: [facility];  Service: Cardiovascular;  Laterality: Left;  . OOPHORECTOMY  1990  . parathyroid transplant     2/3 THYROID REMOVED   ([place])  . SPINE SURGERY    . THYROID SURGERY    . TONSILLECTOMY    . TOTAL HIP ARTHROPLASTY Right 07/02/2019   Procedure: TOTAL HIP ARTHROPLASTY ANTERIOR APPROACH;  Surgeon: Paralee Cancel, MD;  Location: [facility];  Service: Orthopedics;  Laterality: Right;  70 mins  . TOTAL SHOULDER ARTHROPLASTY Left 01/14/2016   Procedure: LEFT TOTAL SHOULDER ARTHROPLASTY;  Surgeon: Justice Britain, MD;  Location: [facility];  Service: Orthopedics;  Laterality: Left;  . TOTAL SHOULDER ARTHROPLASTY Right 11/03/2016   Procedure: RIGHT TOTAL SHOULDER ARTHROPLASTY;  Surgeon: Justice Britain, MD;  Location: [facility];  Service: Orthopedics;  Laterality: Right;  . ULNAR NERVE REPAIR     LEFT  . vein closure procedure Left 2010    Prior to Admission medications   Medication Sig Start Date End Date Taking? Authorizing Provider  acetaminophen (TYLENOL) 325 MG tablet Take 650 mg by mouth every 6 (six) hours as needed.    [provider]  atorvastatin (LIPITOR) 40 MG tablet Take 1 tablet (40 mg total) by mouth daily. 10/14/19 01/12/20  Cannady, Henrine Screws T, NP  benazepril (LOTENSIN) 20 MG tablet Take 1 tablet (20 mg total) by mouth daily. 10/14/19   Cannady, Henrine Screws T, NP  diclofenac Sodium (VOLTAREN) 1 % GEL Apply 2 g topically 4 (four) times daily. 09/24/19   Eulogio Bear, NP  diclofenac Sodium (VOLTAREN) 1 % GEL Apply topically as needed.     [provider]  ferrous sulfate (FERROUSUL) 325 (65 FE) MG tablet Take 1 tablet (325 mg total) by mouth 3 (three) times daily with meals for 14 days. Patient taking  differently: Take 325 mg by mouth daily with breakfast.  07/03/19 11/12/19  Danae Orleans, PA-C  ferrous sulfate 325 (65 FE) MG tablet Take 325 mg by mouth daily with breakfast.    [provider]  levothyroxine (SYNTHROID) 100 MCG tablet Take 1 tablet (100 mcg total) by mouth daily. 10/14/19   Cannady, Henrine Screws T, NP  meclizine (ANTIVERT) 25 MG tablet Take 1 tablet (25 mg total) by mouth 2 (two) times daily as needed for dizziness. 07/17/19   Merlyn Lot, MD  metoprolol tartrate (LOPRESSOR) 50 MG tablet TAKE 1 TABLET(50 MG) BY MOUTH TWICE DAILY 01/01/20   Wellington Hampshire, MD  Multiple Vitamin (MULTIVITAMIN WITH MINERALS) TABS tablet Take 1 tablet by mouth daily. Patient not taking:  Reported on 12/25/2019    [provider]  ondansetron (ZOFRAN) 4 MG tablet Take 1 tablet (4 mg total) by mouth every 8 (eight) hours as needed for nausea or vomiting. 12/12/19   Lequita Asal, MD  traMADol (ULTRAM) 50 MG tablet Take 1 tablet (50 mg total) by mouth every 6 (six) hours as needed. 01/10/20 01/09/21  Lavonia Drafts, MD  triamcinolone cream (KENALOG) 0.1 % Apply 1 application topically 2 (two) times daily. 12/28/17   [facility] Maple, MD     Allergies Oxycodone, Codeine, and Simvastatin  Family History  Problem Relation Age of Onset  . Heart disease Mother        MI  . Arthritis Mother   . Stroke Mother   . Hyperlipidemia Mother   . Hypertension Mother   . Varicose Veins Mother   . Heart disease Father        MI  . Hyperlipidemia Father   . Arthritis Father   . Cancer Father   . COPD Father   . Hearing loss Father   . Hypertension Father   . Vision loss Father   . Diabetes Sister   . COPD Sister   . Heart disease Sister   . Hyperlipidemia Sister   . Hypertension Sister   . Hyperlipidemia Brother   . COPD Brother   . Diabetes Brother   . Heart disease Brother   . Hypertension Brother   . Arthritis Sister   . Hyperlipidemia Sister   . Hypertension Sister   .  Vision loss Sister   . Varicose Veins Sister   . Arthritis Brother   . Varicose Veins Brother   . Arthritis Daughter   . Arthritis Daughter   . Diabetes Daughter   . Cancer Paternal Aunt   . COPD Sister   . Heart disease Sister   . Hyperlipidemia Sister   . Hypertension Sister   . Varicose Veins Sister   . Heart disease Maternal Uncle   . Heart disease Paternal Uncle     Social History Social History   Tobacco Use  . Smoking status: Former Smoker    Packs/day: 3.00    Years: 25.00    Pack years: 75.00    Types: Cigarettes    Quit date: 11/03/1989    Years since quitting: 30.2  . Smokeless tobacco: Never Used  Vaping Use  . Vaping Use: Never used  Substance Use Topics  . Alcohol use: Not Currently    Alcohol/week: 0.0 standard drinks  . Drug use: No    Review of Systems  Constitutional: No fever/chills Eyes: No visual changes.  ENT: No sore throat. Cardiovascular: As above Respiratory: Denies shortness of breath. Gastrointestinal: As above Genitourinary: Negative for dysuria. Musculoskeletal: Negative for back pain. Skin: Negative for rash. Neurological: Negative for headaches   ____________________________________________   PHYSICAL EXAM:  VITAL SIGNS: ED Triage Vitals  Enc Vitals Group     BP 01/10/20 1057 (!) 154/68     Pulse Rate 01/10/20 1057 (!) 57     Resp 01/10/20 1057 18     Temp 01/10/20 1057 98 F (36.7 C)     Temp Source 01/10/20 1057 Oral     SpO2 01/10/20 1057 97 %     Weight 01/10/20 1052 75.8 kg (167 lb)     Height 01/10/20 1052 1.651 m ('5\' 5"' )     Head Circumference --      Peak Flow --      Pain Score 01/10/20  1052 7     Pain Loc --      Pain Edu? --      Excl. in [place]? --     Constitutional: Alert and oriented.   Nose: No congestion/rhinnorhea. Mouth/Throat: Mucous membranes are moist.    Cardiovascular: Normal rate, regular rhythm. Grossly normal heart sounds.  Good peripheral circulation.  Point tenderness to palpation  just underneath the left breast, no rash bruising or swelling, no bony abnormality palpated Respiratory: Normal respiratory effort.  No retractions. Lungs CTAB. Gastrointestinal: Soft and nontender. No distention.   Musculoskeletal: No lower extremity tenderness nor edema.  Warm and well perfused Neurologic:  Normal speech and language. No gross focal neurologic deficits are appreciated.  Skin:  Skin is warm, dry and intact. No rash noted. Psychiatric: Mood and affect are normal. Speech and behavior are normal.  ____________________________________________   LABS (all labs ordered are listed, but only abnormal results are displayed)  Labs Reviewed - No data to display ____________________________________________  EKG  ED ECG REPORT I, Lavonia Drafts, the attending physician, personally viewed and interpreted this ECG.  Date: 01/10/2020  Rhythm: normal sinus rhythm QRS Axis: normal Intervals: normal ST/T Wave abnormalities: normal Narrative Interpretation: no evidence of acute ischemia  ____________________________________________  RADIOLOGY  Chest x-ray reviewed by me, no visualization of rib fracture ____________________________________________   PROCEDURES  Procedure(s) performed: No  Procedures   Critical Care performed: No ____________________________________________   INITIAL IMPRESSION / ASSESSMENT AND PLAN / ED COURSE  Pertinent labs & imaging results that were available during my care of the patient were reviewed by me and considered in my medical decision making (see chart for details).  Patient presents with complaints of chest wall pain underneath the left breast.  No trauma reported.  No fevers chills or cough.  No shortness of breath.  On exam she has point tenderness underneath the left breast suspicious for rib fracture, rib contusion, multiple myeloma lesion, MS pain.  Chest x-ray reviewed by me, no evidence of rib fracture although she does  have multiple lesions from multiple myeloma  Discussed with patient we will proceed with CT Non[place] to evaluate further for possible nondisplaced rib fracture  CT chest reviewed by me, suspect multiple myeloma lesion in the region of her pain, no fractures noted, pending radiology review  CT chest read by radiology and subacute sternal fracture noted, patient feels this was likely several months ago after lifting a heavy water bottle when she had terrible pain in her sternum.  Have sent in basket messages to Dr. Mike Gip, patient would like to start tramadol for pain, appropriate for outpatient follow-up    ____________________________________________   FINAL CLINICAL IMPRESSION(S) / ED DIAGNOSES  Final diagnoses:  Multiple myeloma, remission status unspecified ([place])  Chest wall pain  Closed fracture of body of sternum, initial encounter        Note:  This document was prepared using Dragon voice recognition software and may include unintentional dictation errors.   Lavonia Drafts, MD 01/10/20 1431

## 2020-01-10 NOTE — Telephone Encounter (Signed)
Patient experiencing left side pain near rib underneath breastbone extreme pain when she moves, pcp office has no availability until Monday, patient seeking clinical advice. Patient states she has Cancer lesions all over her ribs and chest- she thinks it may be the cause of her pain. She has not received encouraging news from oncologist. Patient started having pain Wednesday night and it has not gotten better. Per severe pain protocol- advised ED- patient requesting X-ray- advised she needs to be evaluated.  Reason for Disposition . SEVERE chest pain  Answer Assessment - Initial Assessment Questions 1. LOCATION: "Where does it hurt?"       Left rib pain 2. RADIATION: "Does the pain go anywhere else?" (e.g., into neck, jaw, arms, back)     Across the rib cage 3. ONSET: "When did the chest pain begin?" (Minutes, hours or days)      Wednesday night 4. PATTERN "Does the pain come and go, or has it been constant since it started?"  "Does it get worse with exertion?"      Constant-worse when moves 5. DURATION: "How long does it last" (e.g., seconds, minutes, hours)     Hours/days 6. SEVERITY: "How bad is the pain?"  (e.g., Scale 1-10; mild, moderate, or severe)    - MILD (1-3): doesn't interfere with normal activities     - MODERATE (4-7): interferes with normal activities or awakens from sleep    - SEVERE (8-10): excruciating pain, unable to do any normal activities       severe 7. CARDIAC RISK FACTORS: "Do you have any history of heart problems or risk factors for heart disease?" (e.g., angina, prior heart attack; diabetes, high blood pressure, high cholesterol, smoker, or strong family history of heart disease)     Yes- patient states not heart 8. PULMONARY RISK FACTORS: "Do you have any history of lung disease?"  (e.g., blood clots in lung, asthma, emphysema, birth control pills)     Yes- patient states not lung 9. CAUSE: "What do you think is causing the chest pain?"     Rib pain 10. OTHER  SYMPTOMS: "Do you have any other symptoms?" (e.g., dizziness, nausea, vomiting, sweating, fever, difficulty breathing, cough)       no 11. PREGNANCY: "Is there any chance you are pregnant?" "When was your last menstrual period?"       n/a  Protocols used: CHEST PAIN-A-AH

## 2020-01-10 NOTE — Telephone Encounter (Signed)
-----   Message from Lequita Asal, MD sent at 01/09/2020  5:40 PM EDT ----- Regarding: Please call patient  M-spike has increased to 2.6 gm/dL.  ----- Message ----- From: Buel Ream, Lab In [place] Sent: 01/08/2020   3:16 PM EDT To: Lequita Asal, MD

## 2020-01-10 NOTE — ED Triage Notes (Signed)
Presents with left lateral rib pain  States pain started on weds evening   States pain started while sitting

## 2020-01-13 ENCOUNTER — Encounter: Payer: Self-pay | Admitting: Hospice and Palliative Medicine

## 2020-01-13 ENCOUNTER — Inpatient Hospital Stay (HOSPITAL_BASED_OUTPATIENT_CLINIC_OR_DEPARTMENT_OTHER): Payer: Medicare Other | Admitting: Hospice and Palliative Medicine

## 2020-01-13 ENCOUNTER — Other Ambulatory Visit: Payer: Self-pay

## 2020-01-13 VITALS — BP 162/58 | HR 76 | Temp 97.8°F | Resp 20 | Wt 167.3 lb

## 2020-01-13 DIAGNOSIS — I779 Disorder of arteries and arterioles, unspecified: Secondary | ICD-10-CM

## 2020-01-13 DIAGNOSIS — D539 Nutritional anemia, unspecified: Secondary | ICD-10-CM | POA: Diagnosis not present

## 2020-01-13 DIAGNOSIS — Z7189 Other specified counseling: Secondary | ICD-10-CM | POA: Diagnosis not present

## 2020-01-13 DIAGNOSIS — C9000 Multiple myeloma not having achieved remission: Secondary | ICD-10-CM | POA: Diagnosis not present

## 2020-01-13 DIAGNOSIS — G893 Neoplasm related pain (acute) (chronic): Secondary | ICD-10-CM

## 2020-01-13 DIAGNOSIS — I130 Hypertensive heart and chronic kidney disease with heart failure and stage 1 through stage 4 chronic kidney disease, or unspecified chronic kidney disease: Secondary | ICD-10-CM | POA: Diagnosis not present

## 2020-01-13 DIAGNOSIS — Z515 Encounter for palliative care: Secondary | ICD-10-CM

## 2020-01-13 NOTE — Progress Notes (Signed)
[facility]  Telephone:([phone] [phone]   Name: Bailey Ayala Date: 01/13/2020 MRN: [id number]  DOB: 1940-05-13  Patient Care Team: Venita Lick, NP as PCP - General (Nurse Practitioner) Wellington Hampshire, MD as PCP - Cardiology (Cardiology) Christene Lye, MD (General Surgery) Guadalupe Maple, MD (Family Medicine) Birder Robson, MD as Referring Physician (Ophthalmology) Vladimir Crofts, MD (Neurology) Anthonette Legato, MD (Internal Medicine) Wellington Hampshire, MD as Consulting Physician (Cardiology) Paralee Cancel, MD as Consulting Physician (Orthopedic Surgery)    REASON FOR CONSULTATION: Bailey Ayala is a 78 y.o. female with multiple medical problems including multiple myeloma not in remission with widespread lytic lesions throughout the skeleton.  Patient is not on active treatment and hospice has been recommended.  Patient has had progressive fatigue and bone pain.  She has had hypercalcemia.  She was seen in the ED on 01/10/2020 with chest pain.  CT revealed a pathologic fracture to the sternum.  Patient was started on tramadol.  She is referred to palliative care to help address goals and manage ongoing symptoms.  SOCIAL HISTORY:     reports that she quit smoking about 30 years ago. Her smoking use included cigarettes. She has a 75.00 pack-year smoking history. She has never used smokeless tobacco. She reports previous alcohol use. She reports that she does not use drugs.  Patient lives at home alone.  She has a daughter who lives nearby and another daughter in [place].  ADVANCE DIRECTIVES:  On file  CODE STATUS: DNR/DNI  PAST MEDICAL HISTORY: Past Medical History:  Diagnosis Date  . AAA (abdominal aortic aneurysm) ([place])    The largest aortic measurement is 4.0 cm on 05/31/19  . Allergy 1980  . Anemia 09-2017  . Arthritis   . Bleeding from varicose vein   . Blood dyscrasia     BLEEDS EASILY   . Blood transfusion without reported diagnosis 2011 or 12  . Cataract ?  . CHF (congestive heart failure) ([place]) ?  . CKD (chronic kidney disease), stage III    Moderate  . Clotting disorder ([place]) ?  Marland Kitchen COPD (chronic obstructive pulmonary disease) ([place])   . Coronary artery disease   . Diabetes mellitus without complication ([place])    patient denies  . Diffuse cystic mastopathy   . Emphysema of lung ([place]) ?  Marland Kitchen Emphysema of lung ([place])   . Heartburn   . Hyperlipidemia   . Hypertension   . Hypothyroidism   . Incomplete left bundle branch block (LBBB) 06/22/2019   Noted on EKG  . L4-L5 disc bulge   . Multiple myeloma ([place])   . Myocardial infarction ([place]) 2019  . Osteoporosis, post-menopausal   . PAD (peripheral artery disease) ([place])   . Renal insufficiency   . Shortness of breath dyspnea    WITH EXERTION   . Stroke ([place])    TIA     2016     WAS FOUND ON SCAN ORDERED FROM [facility]UR[facility]  . Thyroid disease   . TIA (transient ischemic attack) 2016   left hand weakness  . Ulnar nerve compression, left   . Vertigo 06/22/2019   doing much today 06/28/2019    PAST SURGICAL HISTORY:  Past Surgical History:  Procedure Laterality Date  . ABDOMINAL HYSTERECTOMY  1990  . BACK SURGERY  2011  . BREAST EXCISIONAL BIOPSY Left    2010?   Marland Kitchen BREAST SURGERY  1975   A lump  removed left breast  . CATARACT EXTRACTION, BILATERAL    . COLONOSCOPY  1998  . ENDOSCOPIC VEIN LASER TREATMENT    . EYE SURGERY Bilateral 05/06/14  . JOINT REPLACEMENT     LT KNEE  . KNEE SURGERY Left 2012  . LEFT HEART CATH AND CORONARY ANGIOGRAPHY Left 11/19/2018   Procedure: LEFT HEART CATH AND CORONARY ANGIOGRAPHY;  Surgeon: Wellington Hampshire, MD;  Location: [facility];  Service: Cardiovascular;  Laterality: Left;  . OOPHORECTOMY  1990  . parathyroid transplant     2/3 THYROID REMOVED   ([place])  . SPINE SURGERY    . THYROID SURGERY    . TONSILLECTOMY    . TOTAL HIP ARTHROPLASTY Right 07/02/2019    Procedure: TOTAL HIP ARTHROPLASTY ANTERIOR APPROACH;  Surgeon: Paralee Cancel, MD;  Location: [facility];  Service: Orthopedics;  Laterality: Right;  70 mins  . TOTAL SHOULDER ARTHROPLASTY Left 01/14/2016   Procedure: LEFT TOTAL SHOULDER ARTHROPLASTY;  Surgeon: Justice Britain, MD;  Location: [facility];  Service: Orthopedics;  Laterality: Left;  . TOTAL SHOULDER ARTHROPLASTY Right 11/03/2016   Procedure: RIGHT TOTAL SHOULDER ARTHROPLASTY;  Surgeon: Justice Britain, MD;  Location: [facility];  Service: Orthopedics;  Laterality: Right;  . ULNAR NERVE REPAIR     LEFT  . vein closure procedure Left 2010    HEMATOLOGY/ONCOLOGY HISTORY:  Oncology History  Multiple myeloma ([place])  01/18/2019 Initial Diagnosis   Multiple myeloma ([place])   07/29/2019 -  Chemotherapy   The patient had bortezomib SQ (VELCADE) chemo injection 2.5 mg, 1.3 mg/m2 = 2.5 mg, Subcutaneous,  Once, 0 of 8 cycles  for chemotherapy treatment.      ALLERGIES:  is allergic to oxycodone, codeine, and simvastatin.  MEDICATIONS:  Current Outpatient Medications  Medication Sig Dispense Refill  . acetaminophen (TYLENOL) 325 MG tablet Take 650 mg by mouth every 6 (six) hours as needed.    . benazepril (LOTENSIN) 20 MG tablet Take 1 tablet (20 mg total) by mouth daily. 90 tablet 4  . diclofenac Sodium (VOLTAREN) 1 % GEL Apply topically as needed.     . ferrous sulfate 325 (65 FE) MG tablet Take 325 mg by mouth daily with breakfast.    . levothyroxine (SYNTHROID) 100 MCG tablet Take 1 tablet (100 mcg total) by mouth daily. 90 tablet 4  . meclizine (ANTIVERT) 25 MG tablet Take 1 tablet (25 mg total) by mouth 2 (two) times daily as needed for dizziness. 15 tablet 0  . metoprolol tartrate (LOPRESSOR) 50 MG tablet TAKE 1 TABLET(50 MG) BY MOUTH TWICE DAILY 180 tablet 3  . traMADol (ULTRAM) 50 MG tablet Take 1 tablet (50 mg total) by mouth every 6 (six) hours as needed. 20 tablet 0  . triamcinolone cream (KENALOG) 0.1 % Apply 1 application topically 2 (two)  times daily. 30 g 0  . atorvastatin (LIPITOR) 40 MG tablet Take 1 tablet (40 mg total) by mouth daily. 90 tablet 4  . diclofenac Sodium (VOLTAREN) 1 % GEL Apply 2 g topically 4 (four) times daily. 50 g 0  . ferrous sulfate (FERROUSUL) 325 (65 FE) MG tablet Take 1 tablet (325 mg total) by mouth 3 (three) times daily with meals for 14 days. (Patient taking differently: Take 325 mg by mouth daily with breakfast. ) 42 tablet 0  . Multiple Vitamin (MULTIVITAMIN WITH MINERALS) TABS tablet Take 1 tablet by mouth daily. (Patient not taking: Reported on 12/25/2019)    . ondansetron (ZOFRAN) 4 MG tablet Take 1 tablet (  4 mg total) by mouth every 8 (eight) hours as needed for nausea or vomiting. 60 tablet 1   No current facility-administered medications for this visit.    VITAL SIGNS: BP (!) 162/58   Pulse 76   Temp 97.8 F (36.6 C)   Resp 20   Wt 167 lb 4.8 oz (75.9 kg)   SpO2 100%   BMI 27.84 kg/m  Filed Weights   01/13/20 1357  Weight: 167 lb 4.8 oz (75.9 kg)    Estimated body mass index is 27.84 kg/m as calculated from the following:   Height as of 01/10/20: '5\' 5"'  (1.651 m).   Weight as of this encounter: 167 lb 4.8 oz (75.9 kg).  LABS: CBC:    Component Value Date/Time   WBC 4.5 01/08/2020 1452   HGB 8.6 (L) 01/08/2020 1452   HGB 10.5 (L) 05/29/2017 1441   HCT 27.7 (L) 01/08/2020 1452   HCT 32.0 (L) 05/29/2017 1441   PLT 274 01/08/2020 1452   PLT 237 05/29/2017 1441   MCV 108.6 (H) 01/08/2020 1452   MCV 97 05/29/2017 1441   NEUTROABS 2.6 01/08/2020 1452   NEUTROABS 3.0 05/29/2017 1441   LYMPHSABS 1.3 01/08/2020 1452   LYMPHSABS 1.5 05/29/2017 1441   MONOABS 0.5 01/08/2020 1452   EOSABS 0.2 01/08/2020 1452   EOSABS 0.2 05/29/2017 1441   BASOSABS 0.0 01/08/2020 1452   BASOSABS 0.0 05/29/2017 1441   Comprehensive Metabolic Panel:    Component Value Date/Time   NA 133 (L) 01/08/2020 1452   NA 136 08/14/2019 1401   K 4.0 01/08/2020 1452   CL 103 01/08/2020 1452   CO2 23  01/08/2020 1452   BUN 23 01/08/2020 1452   BUN 19 08/14/2019 1401   CREATININE 1.32 (H) 01/08/2020 1452   GLUCOSE 89 01/08/2020 1452   CALCIUM 9.7 01/08/2020 1452   AST 15 01/08/2020 1452   AST 27 12/28/2017 1451   ALT 9 01/08/2020 1452   ALT 16 12/28/2017 1451   ALKPHOS 72 01/08/2020 1452   BILITOT 0.2 (L) 01/08/2020 1452   BILITOT 0.3 05/29/2017 1441   PROT 9.6 (H) 01/08/2020 1452   PROT 7.5 05/29/2017 1441   ALBUMIN 3.8 01/08/2020 1452   ALBUMIN 4.3 05/29/2017 1441    RADIOGRAPHIC STUDIES: DG Ribs Unilateral W/Chest Left  Result Date: 01/10/2020 CLINICAL DATA:  Left-sided chest wall pain beginning 2 days ago. Pain started while sitting. Pain with movement and breathing. EXAM: LEFT RIBS AND CHEST - 3 VIEW COMPARISON:  Chest radiograph, 09/24/2019 FINDINGS: No convincing fracture. Skeletal structures are demineralized. There are multiple lucent lesions in the ribs, proximal humeri, clavicles and scapulae. Patient has a reported known diagnosis multiple myeloma. Well-positioned/aligned bilateral shoulder hemi arthroplasties. Cardiac silhouette is normal in size. No mediastinal or hilar masses. Clear lungs. No pleural effusion or pneumothorax. IMPRESSION: 1. No convincing rib fracture. 2. Multiple lucent bone lesions consistent with multiple myeloma. 3. No active cardiopulmonary disease. Electronically Signed   By: Lajean Manes M.D.   On: 01/10/2020 11:24   CT Chest Wo Contrast  Result Date: 01/10/2020 CLINICAL DATA:  Left chest wall pain x 2 days, denies injury. Multiple myeloma history. PET scan on 11/06/2019 revealed innumerable new lytic expansile lesions in the skeleton. Many are small and not appreciably hypermetabolic, but numerous hypermetabolic lesions are also identified especially in the sternum, clavicles, ribs, thoracolumbar spine, bony pelvis, and femurs. EXAM: CT CHEST WITHOUT CONTRAST TECHNIQUE: Multidetector CT imaging of the chest was performed following the standard  protocol without IV contrast. COMPARISON:  01/24/2018.  PET-CT, 11/06/2019. FINDINGS: Cardiovascular: Cardiac silhouette is normal in size. No pericardial effusion. There are three-vessel coronary artery calcifications. Great vessels are normal in caliber. There are aortic and arch branch vessel atherosclerotic calcifications. Mediastinum/Nodes: No neck base, axillary, mediastinal or hilar masses or enlarged lymph nodes. Normal trachea and esophagus. Lungs/Pleura: Centrilobular emphysema. Chronic interstitial thickening most evident in the upper lobes. Minor areas of peripheral reticular opacity consistent with scarring. No evidence of pneumonia or pulmonary edema. No lung mass or nodule. No pleural effusion or pneumothorax. Stable appearance from the prior chest CT. Upper Abdomen: No acute findings. Musculoskeletal: Numerous lytic bone lesions throughout the visualized skeleton. Pathologic fracture of the mid sternal body. This is new since the CT from 01/24/2018. Lytic bone lesions have notably progressed when compared to the 2019 exam, but are similar to the more recent prior PET-CT. Mild loss of the vertebral body height at T8 new since the 2019 exam IMPRESSION: 1. Numerous lytic bone lesions consistent with widespread multiple myeloma, significantly progressed when compared to the chest CT dated 01/24/2018. 2. Pathologic fracture of the mid sternal body, also new since the 2019 CT. Mild loss of the T8 vertebral body height without a defined fracture. 3. No acute findings the lungs. 4. Three-vessel coronary artery calcifications and aortic atherosclerosis. Emphysema. Aortic Atherosclerosis (ICD10-I70.0) and Emphysema (ICD10-J43.9). Electronically Signed   By: Lajean Manes M.D.   On: 01/10/2020 13:58    PERFORMANCE STATUS (ECOG) : 2 - Symptomatic, <50% confined to bed  Review of Systems Unless otherwise noted, a complete review of systems is negative.  Physical Exam General: NAD, frail  appearing Pulmonary: Unlabored Extremities: no edema, no joint deformities Skin: no rashes Neurological: Weakness but otherwise nonfocal  IMPRESSION: I met with patient and her daughter.  Patient's other daughter participated in the visit via phone.  At baseline, patient lives at home alone.  She feels she is doing well but daughters clearly have some concerns with her staying home alone and are interested in exploring options to provide her with increased support.  Patient still drives and is functionally independent.  Symptomatically, patient endorses chronic bone pain.  She has been managing with daily acetaminophen but was started on tramadol on 9/17 when seen in the ER.  Patient has been taking the tramadol at bedtime and it has helped her rest.  We discussed increasing the frequency to include daytime use but patient is hesitant as she does not want to give up driving and is fearful of addiction and side effects.  We discussed pain regimen in detail and I suggested that she could try half a tramadol tablet during the daytime hours to see how she tolerated it.  Patient endorses chronic fatigue and is napping some during the day.  She has poor oral intake and is intolerant of supplements.  She has chronic diarrhea.  Patient says that hospice has been recommended by her oncologist, Dr. Mike Gip for the past 2 months.  Patient confirms that she is not interested in pursuing any treatments for her cancer.  Patient brought a list with her of questions regarding hospice.  We discussed hospice extensively and patient and family were ultimately in support of pursuing a hospice referral.  Patient says that she is a DNR/DNI.  She says she has a DNR order at home.  I called in an order for hospice at [facility]  PLAN: -Best supportive care -Referral for hospice -DNR/DNI -Continue tramadol/acetaminophen as needed -RTC  as needed  Case and plan discussed with Dr. Mike Gip  Patient expressed  understanding and was in agreement with this plan. She also understands that She can call the clinic at any time with any questions, concerns, or complaints.     Time Total: 60 minutes  Visit consisted of counseling and education dealing with the complex and emotionally intense issues of symptom management and palliative care in the setting of serious and potentially life-threatening illness.Greater than 50%  of this time was spent counseling and coordinating care related to the above assessment and plan.  Signed by: Altha Harm, PhD, NP-C

## 2020-01-21 DIAGNOSIS — K529 Noninfective gastroenteritis and colitis, unspecified: Secondary | ICD-10-CM | POA: Diagnosis not present

## 2020-01-21 DIAGNOSIS — N183 Chronic kidney disease, stage 3 unspecified: Secondary | ICD-10-CM | POA: Diagnosis not present

## 2020-01-21 DIAGNOSIS — I509 Heart failure, unspecified: Secondary | ICD-10-CM | POA: Diagnosis not present

## 2020-01-21 DIAGNOSIS — I130 Hypertensive heart and chronic kidney disease with heart failure and stage 1 through stage 4 chronic kidney disease, or unspecified chronic kidney disease: Secondary | ICD-10-CM | POA: Diagnosis not present

## 2020-01-21 DIAGNOSIS — R634 Abnormal weight loss: Secondary | ICD-10-CM | POA: Diagnosis not present

## 2020-01-21 DIAGNOSIS — Z87891 Personal history of nicotine dependence: Secondary | ICD-10-CM | POA: Diagnosis not present

## 2020-01-21 DIAGNOSIS — C7989 Secondary malignant neoplasm of other specified sites: Secondary | ICD-10-CM | POA: Diagnosis not present

## 2020-01-21 DIAGNOSIS — R42 Dizziness and giddiness: Secondary | ICD-10-CM | POA: Diagnosis not present

## 2020-01-21 DIAGNOSIS — I252 Old myocardial infarction: Secondary | ICD-10-CM | POA: Diagnosis not present

## 2020-01-21 DIAGNOSIS — C9000 Multiple myeloma not having achieved remission: Secondary | ICD-10-CM | POA: Diagnosis not present

## 2020-01-21 DIAGNOSIS — Z6827 Body mass index (BMI) 27.0-27.9, adult: Secondary | ICD-10-CM | POA: Diagnosis not present

## 2020-01-21 DIAGNOSIS — I2510 Atherosclerotic heart disease of native coronary artery without angina pectoris: Secondary | ICD-10-CM | POA: Diagnosis not present

## 2020-01-21 DIAGNOSIS — I739 Peripheral vascular disease, unspecified: Secondary | ICD-10-CM | POA: Diagnosis not present

## 2020-01-21 DIAGNOSIS — I714 Abdominal aortic aneurysm, without rupture: Secondary | ICD-10-CM | POA: Diagnosis not present

## 2020-01-21 DIAGNOSIS — E785 Hyperlipidemia, unspecified: Secondary | ICD-10-CM | POA: Diagnosis not present

## 2020-01-21 DIAGNOSIS — E039 Hypothyroidism, unspecified: Secondary | ICD-10-CM | POA: Diagnosis not present

## 2020-01-21 DIAGNOSIS — K219 Gastro-esophageal reflux disease without esophagitis: Secondary | ICD-10-CM | POA: Diagnosis not present

## 2020-01-21 DIAGNOSIS — J449 Chronic obstructive pulmonary disease, unspecified: Secondary | ICD-10-CM | POA: Diagnosis not present

## 2020-01-21 DIAGNOSIS — D509 Iron deficiency anemia, unspecified: Secondary | ICD-10-CM | POA: Diagnosis not present

## 2020-01-21 DIAGNOSIS — Z8673 Personal history of transient ischemic attack (TIA), and cerebral infarction without residual deficits: Secondary | ICD-10-CM | POA: Diagnosis not present

## 2020-01-21 DIAGNOSIS — C7951 Secondary malignant neoplasm of bone: Secondary | ICD-10-CM | POA: Diagnosis not present

## 2020-01-21 DIAGNOSIS — M8458XD Pathological fracture in neoplastic disease, other specified site, subsequent encounter for fracture with routine healing: Secondary | ICD-10-CM | POA: Diagnosis not present

## 2020-01-22 DIAGNOSIS — I2510 Atherosclerotic heart disease of native coronary artery without angina pectoris: Secondary | ICD-10-CM | POA: Diagnosis not present

## 2020-01-22 DIAGNOSIS — C7989 Secondary malignant neoplasm of other specified sites: Secondary | ICD-10-CM | POA: Diagnosis not present

## 2020-01-22 DIAGNOSIS — M8458XD Pathological fracture in neoplastic disease, other specified site, subsequent encounter for fracture with routine healing: Secondary | ICD-10-CM | POA: Diagnosis not present

## 2020-01-22 DIAGNOSIS — C7951 Secondary malignant neoplasm of bone: Secondary | ICD-10-CM | POA: Diagnosis not present

## 2020-01-22 DIAGNOSIS — C9000 Multiple myeloma not having achieved remission: Secondary | ICD-10-CM | POA: Diagnosis not present

## 2020-01-22 DIAGNOSIS — I714 Abdominal aortic aneurysm, without rupture: Secondary | ICD-10-CM | POA: Diagnosis not present

## 2020-01-23 ENCOUNTER — Ambulatory Visit (INDEPENDENT_AMBULATORY_CARE_PROVIDER_SITE_OTHER): Payer: Medicare Other | Admitting: Podiatry

## 2020-01-23 ENCOUNTER — Other Ambulatory Visit: Payer: Self-pay

## 2020-01-23 ENCOUNTER — Encounter: Payer: Self-pay | Admitting: Podiatry

## 2020-01-23 DIAGNOSIS — B351 Tinea unguium: Secondary | ICD-10-CM | POA: Diagnosis not present

## 2020-01-23 DIAGNOSIS — M79675 Pain in left toe(s): Secondary | ICD-10-CM | POA: Diagnosis not present

## 2020-01-23 DIAGNOSIS — M79674 Pain in right toe(s): Secondary | ICD-10-CM

## 2020-01-23 DIAGNOSIS — N1832 Chronic kidney disease, stage 3b: Secondary | ICD-10-CM

## 2020-01-23 DIAGNOSIS — L84 Corns and callosities: Secondary | ICD-10-CM | POA: Diagnosis not present

## 2020-01-23 NOTE — Progress Notes (Signed)
This patient returns to my office for at risk foot care.  This patient requires this care by a professional since this patient will be at risk due to having PAD and chronic kidney disease.  This patient is unable to cut nails herself since the patient cannot reach her nails.These nails are painful walking and wearing shoes.  This patient presents for at risk foot care today.  General Appearance  Alert, conversant and in no acute stress.  Vascular  Dorsalis pedis and posterior tibial  pulses are palpable  bilaterally.  Capillary return is within normal limits  bilaterally. Temperature is within normal limits  bilaterally.  Neurologic  Senn-Weinstein monofilament wire test diminished   bilaterally. Muscle power within normal limits bilaterally.  Nails Thick disfigured discolored nails with subungual debris  from hallux to fifth toes bilaterally. No evidence of bacterial infection or drainage bilaterally.  Orthopedic  No limitations of motion  feet .  No crepitus or effusions noted.  No bony pathology or digital deformities noted.  HAV with overlapping second digits  B/L.  PTTD and DJD right rearfoot. Contracted digits  B/L.  Skin  normotropic skin with no porokeratosis noted bilaterally.  No signs of infections or ulcers noted.     Onychomycosis  Pain in right toes  Pain in left toes  Consent was obtained for treatment procedures.   Mechanical debridement of nails 1-5  bilaterally performed with a nail nipper.  Filed with dremel without incident. Patient requests a f/u appointment so that her insurance covers her visit.   Return office visit   9 weeks                    Told patient to return for periodic foot care and evaluation due to potential at risk complications.   Gardiner Barefoot DPM

## 2020-01-24 DIAGNOSIS — I739 Peripheral vascular disease, unspecified: Secondary | ICD-10-CM | POA: Diagnosis not present

## 2020-01-24 DIAGNOSIS — I714 Abdominal aortic aneurysm, without rupture: Secondary | ICD-10-CM | POA: Diagnosis not present

## 2020-01-24 DIAGNOSIS — C7989 Secondary malignant neoplasm of other specified sites: Secondary | ICD-10-CM | POA: Diagnosis not present

## 2020-01-24 DIAGNOSIS — R42 Dizziness and giddiness: Secondary | ICD-10-CM | POA: Diagnosis not present

## 2020-01-24 DIAGNOSIS — N183 Chronic kidney disease, stage 3 unspecified: Secondary | ICD-10-CM | POA: Diagnosis not present

## 2020-01-24 DIAGNOSIS — E039 Hypothyroidism, unspecified: Secondary | ICD-10-CM | POA: Diagnosis not present

## 2020-01-24 DIAGNOSIS — Z8673 Personal history of transient ischemic attack (TIA), and cerebral infarction without residual deficits: Secondary | ICD-10-CM | POA: Diagnosis not present

## 2020-01-24 DIAGNOSIS — Z6827 Body mass index (BMI) 27.0-27.9, adult: Secondary | ICD-10-CM | POA: Diagnosis not present

## 2020-01-24 DIAGNOSIS — C7951 Secondary malignant neoplasm of bone: Secondary | ICD-10-CM | POA: Diagnosis not present

## 2020-01-24 DIAGNOSIS — R634 Abnormal weight loss: Secondary | ICD-10-CM | POA: Diagnosis not present

## 2020-01-24 DIAGNOSIS — Z87891 Personal history of nicotine dependence: Secondary | ICD-10-CM | POA: Diagnosis not present

## 2020-01-24 DIAGNOSIS — I252 Old myocardial infarction: Secondary | ICD-10-CM | POA: Diagnosis not present

## 2020-01-24 DIAGNOSIS — I2510 Atherosclerotic heart disease of native coronary artery without angina pectoris: Secondary | ICD-10-CM | POA: Diagnosis not present

## 2020-01-24 DIAGNOSIS — K529 Noninfective gastroenteritis and colitis, unspecified: Secondary | ICD-10-CM | POA: Diagnosis not present

## 2020-01-24 DIAGNOSIS — D509 Iron deficiency anemia, unspecified: Secondary | ICD-10-CM | POA: Diagnosis not present

## 2020-01-24 DIAGNOSIS — J449 Chronic obstructive pulmonary disease, unspecified: Secondary | ICD-10-CM | POA: Diagnosis not present

## 2020-01-24 DIAGNOSIS — K219 Gastro-esophageal reflux disease without esophagitis: Secondary | ICD-10-CM | POA: Diagnosis not present

## 2020-01-24 DIAGNOSIS — M8458XD Pathological fracture in neoplastic disease, other specified site, subsequent encounter for fracture with routine healing: Secondary | ICD-10-CM | POA: Diagnosis not present

## 2020-01-24 DIAGNOSIS — I509 Heart failure, unspecified: Secondary | ICD-10-CM | POA: Diagnosis not present

## 2020-01-24 DIAGNOSIS — I130 Hypertensive heart and chronic kidney disease with heart failure and stage 1 through stage 4 chronic kidney disease, or unspecified chronic kidney disease: Secondary | ICD-10-CM | POA: Diagnosis not present

## 2020-01-24 DIAGNOSIS — E785 Hyperlipidemia, unspecified: Secondary | ICD-10-CM | POA: Diagnosis not present

## 2020-01-24 DIAGNOSIS — C9000 Multiple myeloma not having achieved remission: Secondary | ICD-10-CM | POA: Diagnosis not present

## 2020-01-24 IMAGING — CR DG BONE SURVEY MET
9 of 10 series · 9 of 10 positions shown · non-contrast
Comparison: Prior radiographs

CLINICAL DATA: Monoclonal gammopathy.  Evaluate bony involvement..

EXAM:
METASTATIC BONE SURVEY

[chest pa]
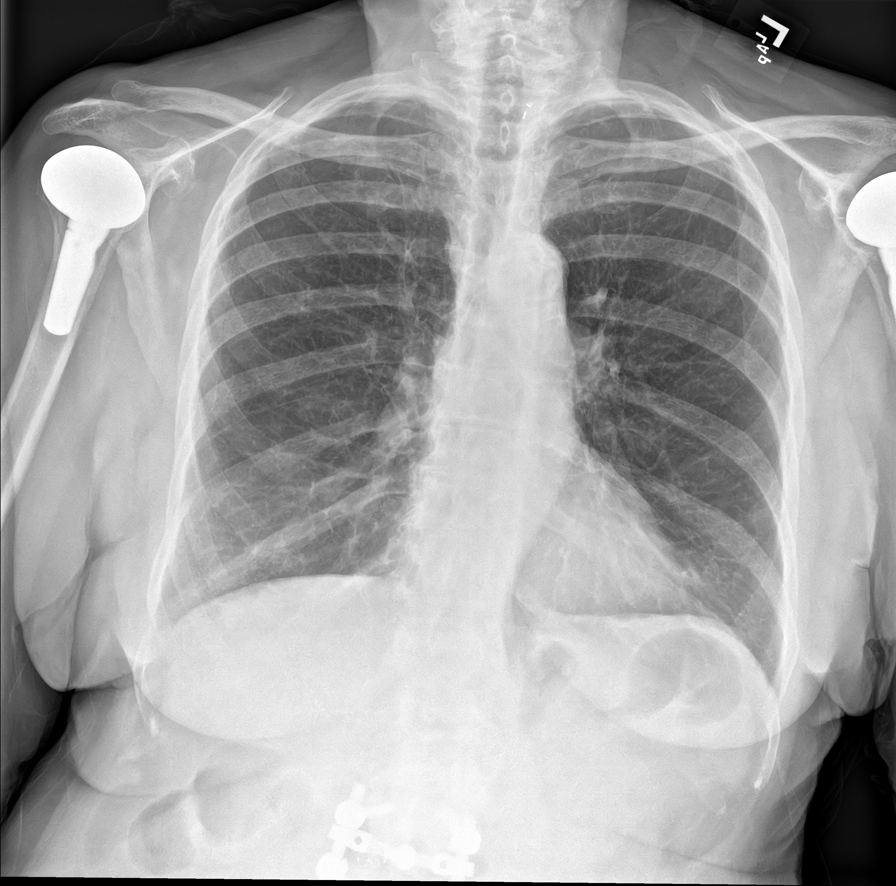

[c-spine lat]
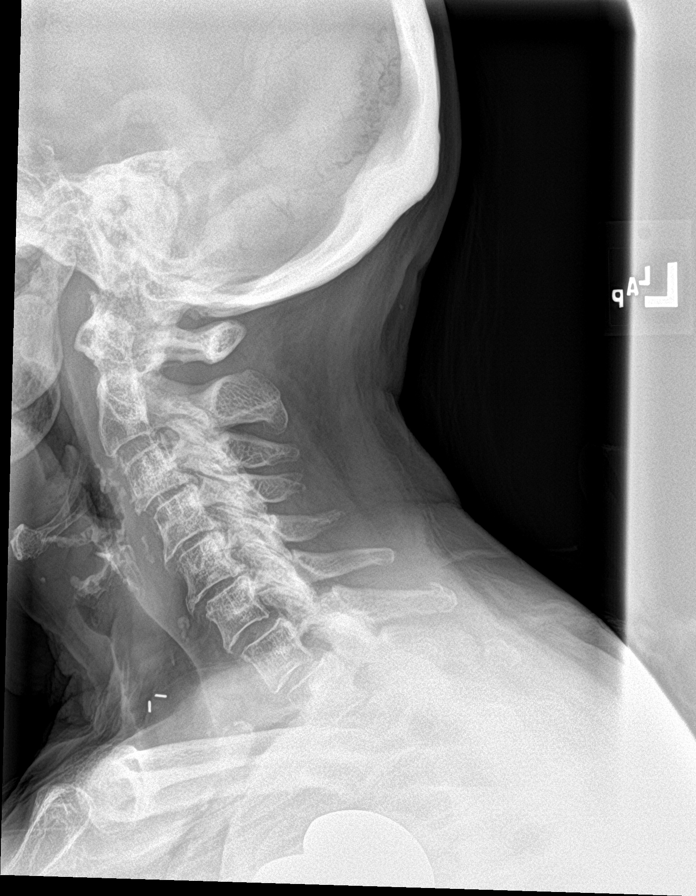

[skull lat]
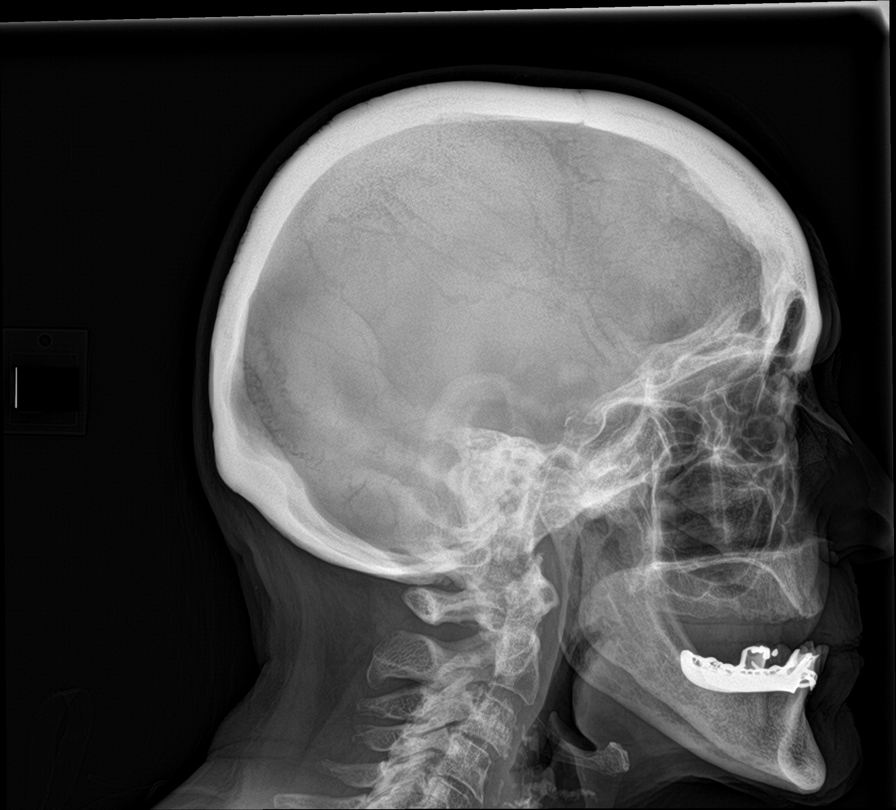

[c-spine ap]
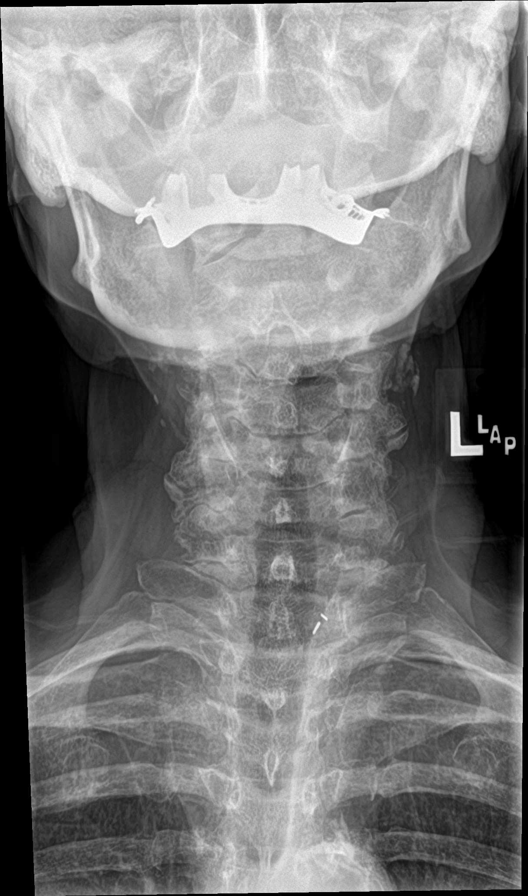

[shoulder ap (1 of 2)]
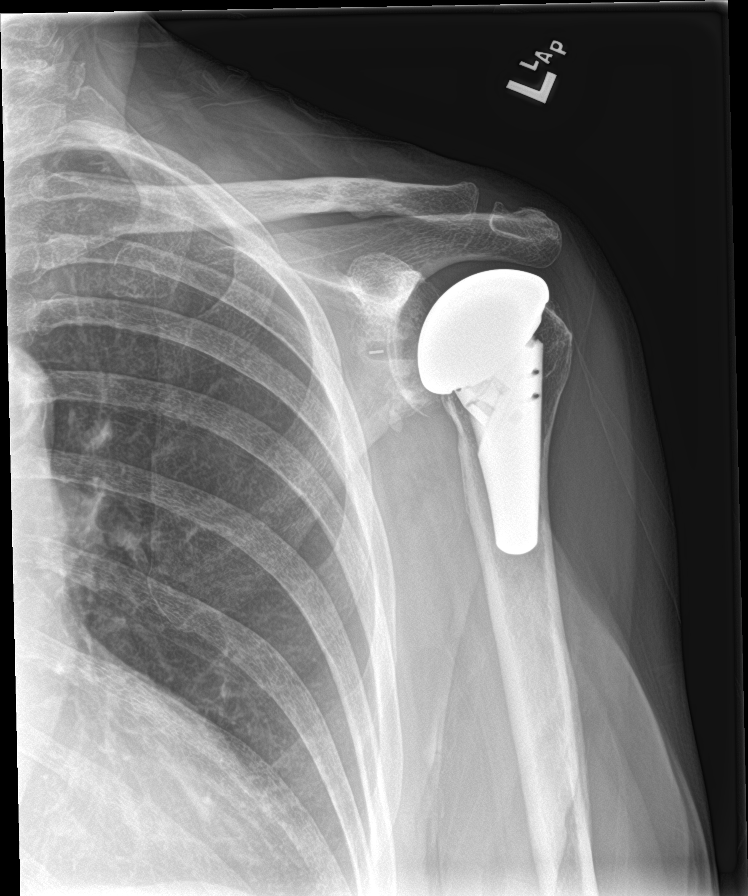

[shoulder ap (2 of 2)]
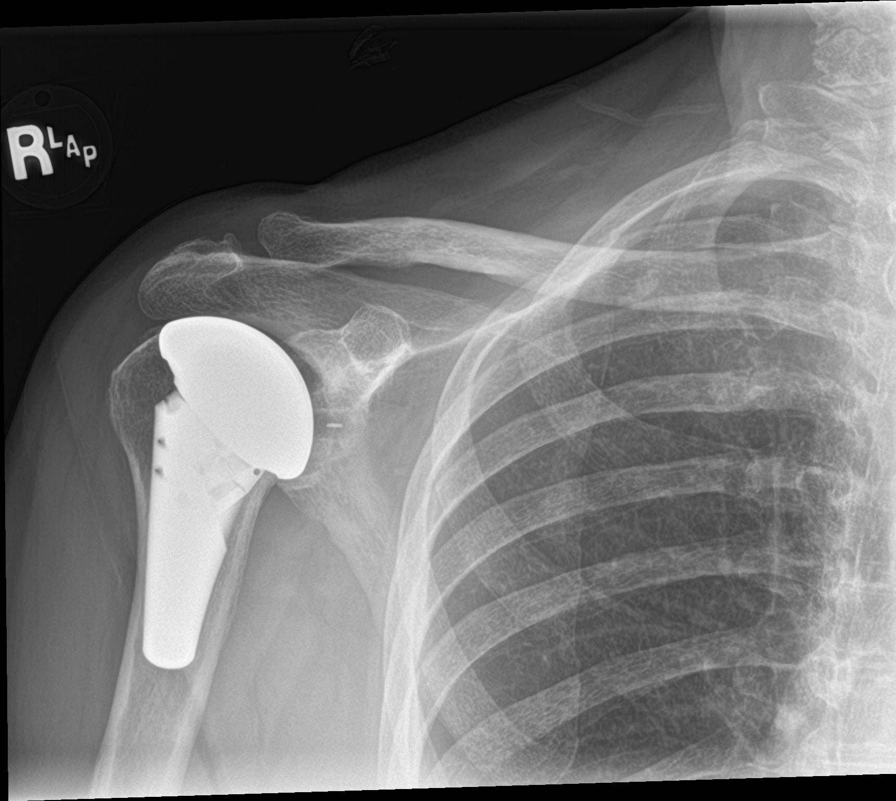

[humerus ap (1 of 2)]
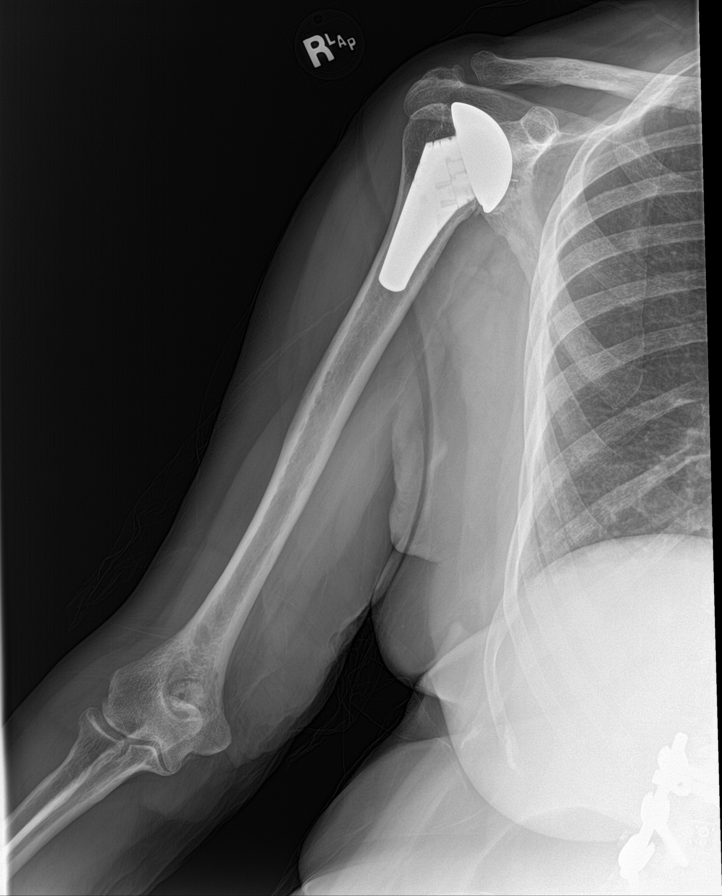

[humerus ap (2 of 2)]
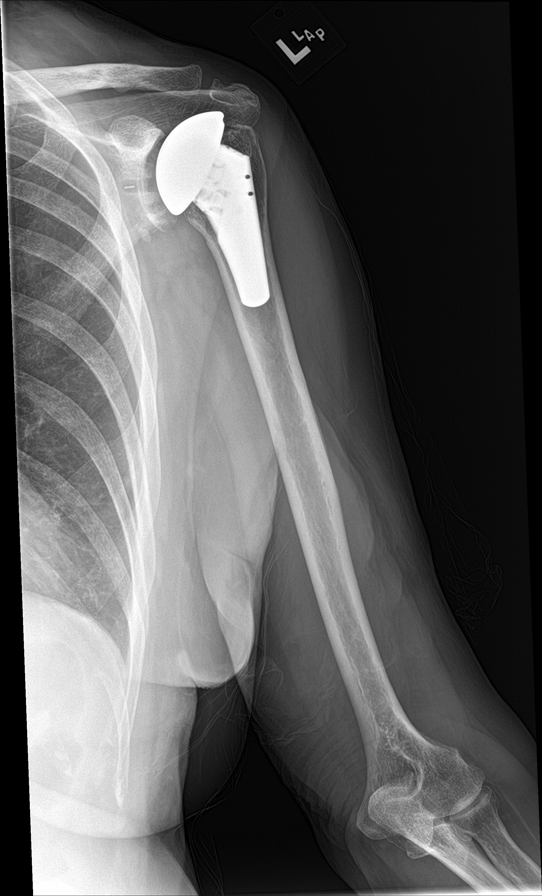

[forearm ap]
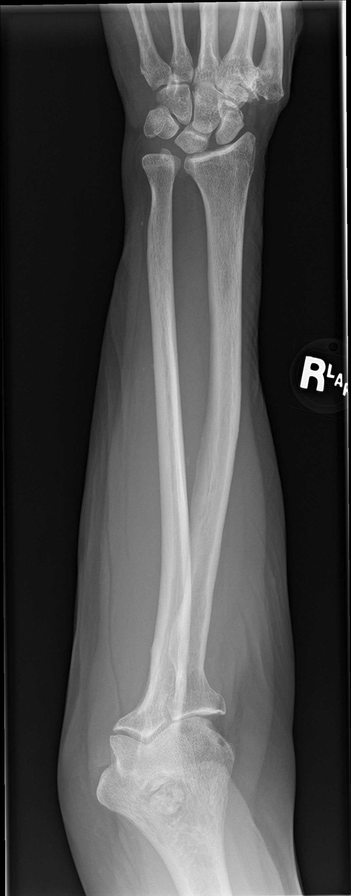

[9 of 10 positions shown; findings below may reference images not displayed]

FINDINGS: No suspicious focal bony lesions are identified.

Surgical changes within both shoulders, lumbar spine and LEFT knee
noted.

Degenerative changes within the spine are present.
IMPRESSION: No suspicious focal bony lesions.

## 2020-01-27 DIAGNOSIS — I714 Abdominal aortic aneurysm, without rupture: Secondary | ICD-10-CM | POA: Diagnosis not present

## 2020-01-27 DIAGNOSIS — C7951 Secondary malignant neoplasm of bone: Secondary | ICD-10-CM | POA: Diagnosis not present

## 2020-01-27 DIAGNOSIS — C9000 Multiple myeloma not having achieved remission: Secondary | ICD-10-CM | POA: Diagnosis not present

## 2020-01-27 DIAGNOSIS — I2510 Atherosclerotic heart disease of native coronary artery without angina pectoris: Secondary | ICD-10-CM | POA: Diagnosis not present

## 2020-01-27 DIAGNOSIS — M8458XD Pathological fracture in neoplastic disease, other specified site, subsequent encounter for fracture with routine healing: Secondary | ICD-10-CM | POA: Diagnosis not present

## 2020-01-27 DIAGNOSIS — C7989 Secondary malignant neoplasm of other specified sites: Secondary | ICD-10-CM | POA: Diagnosis not present

## 2020-01-28 DIAGNOSIS — C7951 Secondary malignant neoplasm of bone: Secondary | ICD-10-CM | POA: Diagnosis not present

## 2020-01-28 DIAGNOSIS — M8458XD Pathological fracture in neoplastic disease, other specified site, subsequent encounter for fracture with routine healing: Secondary | ICD-10-CM | POA: Diagnosis not present

## 2020-01-28 DIAGNOSIS — C9000 Multiple myeloma not having achieved remission: Secondary | ICD-10-CM | POA: Diagnosis not present

## 2020-01-28 DIAGNOSIS — C7989 Secondary malignant neoplasm of other specified sites: Secondary | ICD-10-CM | POA: Diagnosis not present

## 2020-01-28 DIAGNOSIS — I714 Abdominal aortic aneurysm, without rupture: Secondary | ICD-10-CM | POA: Diagnosis not present

## 2020-01-28 DIAGNOSIS — I2510 Atherosclerotic heart disease of native coronary artery without angina pectoris: Secondary | ICD-10-CM | POA: Diagnosis not present

## 2020-01-29 ENCOUNTER — Telehealth: Payer: Self-pay | Admitting: Nurse Practitioner

## 2020-01-29 DIAGNOSIS — C7989 Secondary malignant neoplasm of other specified sites: Secondary | ICD-10-CM | POA: Diagnosis not present

## 2020-01-29 DIAGNOSIS — I2510 Atherosclerotic heart disease of native coronary artery without angina pectoris: Secondary | ICD-10-CM | POA: Diagnosis not present

## 2020-01-29 DIAGNOSIS — M8458XD Pathological fracture in neoplastic disease, other specified site, subsequent encounter for fracture with routine healing: Secondary | ICD-10-CM | POA: Diagnosis not present

## 2020-01-29 DIAGNOSIS — C9000 Multiple myeloma not having achieved remission: Secondary | ICD-10-CM | POA: Diagnosis not present

## 2020-01-29 DIAGNOSIS — C7951 Secondary malignant neoplasm of bone: Secondary | ICD-10-CM | POA: Diagnosis not present

## 2020-01-29 DIAGNOSIS — I714 Abdominal aortic aneurysm, without rupture: Secondary | ICD-10-CM | POA: Diagnosis not present

## 2020-01-29 NOTE — Telephone Encounter (Signed)
Copied from [facility] [id number]. Topic: Medicare AWV >> Jan 29, 2020  2:47 PM Cher Nakai R wrote: Reason for CRM:   Left message for patient to call back and schedule Medicare Annual Wellness Visit (AWV) to be done virtually.  No hx of AWV 02/06/2020  Please schedule at anytime with [facility]-Nurse Health Advisor.      65 Minutes appointment   Any questions, please call me at [phone]

## 2020-02-04 NOTE — Progress Notes (Signed)
[facility]  [address] [place], [place] [address] Phone: [phone]  Fax: [phone]   Clinic Day:  02/05/2020  Referring physician: Venita Lick, NP  Chief Complaint: Bailey Ayala is a 79 y.o. female with multiplemyelomawho is seen for 1 month assessment.  HPI: The patient was last seen in the medical oncology clinic on 01/08/2020. At that time, she felt "ok".  She had back and rib pain.  She was not taking Tramadol.  Appetite was decreased.  She had lost weight. Hematocrit was 27.7, hemoglobin 8.6, MCV 108.6, platelets 274,000, WBC 4,500. Sodium was 133. Creatinine was 1.32 (CrCl 39 ml/min). Total protein was 9.6. Bilirubin was 0.2. M spike was 2.6 g/dL. Kappa free light chain was 17.3, lambda free light chain 577.9, ratio 0.03.  She continued to decline treatment.  The patient was seen in the [facility] ER on 01/10/2020 for rib pain. She thought she may have injured her rib due to her multiple myeloma lesions. Chest CT without contrast showed numerous lytic bone lesions consistent with widespread multiple myeloma, significantly progressed when compared to the chest CT dated 01/24/2018. There was a pathologic fracture of the mid sternal body, also new since the 2019 CT. There was mild loss of the T8 vertebral body height without a defined fracture. There were no acute findings the lungs. There was three-vessel coronary artery calcifications, aortic atherosclerosis, and emphysema. She was to start Tramadol for pain.  The patient spoke to Billey Chang, NP on 01/13/2020. They discussed trying half of a Tramadol tablet during the day but the patient was hesitant because she did not want to give up driving and was fearful of addiction. She confirmed that she was not interested in pursuing treatment and was a DNR/DNI. She was referred to [facility].  During the interim, she has been okay. She tried a whole pill Tramadol twice at night and once during  the day and "did not feel right." She has not tried to take half a pill yet. Tylenol does not help anymore. Her sternum does not hurt as much as before. Her back hurts from her waist to her shoulder blades.  Her appetite is poor. She does not like the taste of Boost or Ensure so she has not been drinking them.  She is able to complete some chores around the house. She makes her bed, does her dishes, and does her laundry.   She enrolled in Hospice on 01/21/2020. Going forward, she would like to have monthly labs and get a phone call with results.   Past Medical History:  Diagnosis Date  . AAA (abdominal aortic aneurysm) ([place])    The largest aortic measurement is 4.0 cm on 05/31/19  . Allergy 1980  . Anemia 09-2017  . Arthritis   . Bleeding from varicose vein   . Blood dyscrasia    BLEEDS EASILY   . Blood transfusion without reported diagnosis 2011 or 12  . Cataract ?  . CHF (congestive heart failure) ([place]) ?  . CKD (chronic kidney disease), stage III (HCC)    Moderate  . Clotting disorder ([place]) ?  Marland Kitchen COPD (chronic obstructive pulmonary disease) ([place])   . Coronary artery disease   . Diabetes mellitus without complication ([place])    patient denies  . Diffuse cystic mastopathy   . Emphysema of lung ([place]) ?  Marland Kitchen Emphysema of lung ([place])   . Heartburn   . Hyperlipidemia   . Hypertension   . Hypothyroidism   .  Incomplete left bundle branch block (LBBB) 06/22/2019   Noted on EKG  . L4-L5 disc bulge   . Multiple myeloma ([place])   . Myocardial infarction ([place]) 2019  . Osteoporosis, post-menopausal   . PAD (peripheral artery disease) ([place])   . Renal insufficiency   . Shortness of breath dyspnea    WITH EXERTION   . Stroke ([place])    TIA     2016     WAS FOUND ON SCAN ORDERED FROM NEUROL  . Thyroid disease   . TIA (transient ischemic attack) 2016   left hand weakness  . Ulnar nerve compression, left   . Vertigo 06/22/2019   doing much today 06/28/2019    Past Surgical History:  Procedure  Laterality Date  . ABDOMINAL HYSTERECTOMY  1990  . BACK SURGERY  2011  . BREAST EXCISIONAL BIOPSY Left    2010?   Marland Kitchen BREAST SURGERY  1975   A lump removed left breast  . CATARACT EXTRACTION, BILATERAL    . COLONOSCOPY  1998  . ENDOSCOPIC VEIN LASER TREATMENT    . EYE SURGERY Bilateral 05/06/14  . JOINT REPLACEMENT     LT KNEE  . KNEE SURGERY Left 2012  . LEFT HEART CATH AND CORONARY ANGIOGRAPHY Left 11/19/2018   Procedure: LEFT HEART CATH AND CORONARY ANGIOGRAPHY;  Surgeon: Wellington Hampshire, MD;  Location: [facility];  Service: Cardiovascular;  Laterality: Left;  . OOPHORECTOMY  1990  . parathyroid transplant     2/3 THYROID REMOVED   ([place])  . SPINE SURGERY    . THYROID SURGERY    . TONSILLECTOMY    . TOTAL HIP ARTHROPLASTY Right 07/02/2019   Procedure: TOTAL HIP ARTHROPLASTY ANTERIOR APPROACH;  Surgeon: Paralee Cancel, MD;  Location: [facility];  Service: Orthopedics;  Laterality: Right;  70 mins  . TOTAL SHOULDER ARTHROPLASTY Left 01/14/2016   Procedure: LEFT TOTAL SHOULDER ARTHROPLASTY;  Surgeon: Justice Britain, MD;  Location: [facility];  Service: Orthopedics;  Laterality: Left;  . TOTAL SHOULDER ARTHROPLASTY Right 11/03/2016   Procedure: RIGHT TOTAL SHOULDER ARTHROPLASTY;  Surgeon: Justice Britain, MD;  Location: [facility];  Service: Orthopedics;  Laterality: Right;  . ULNAR NERVE REPAIR     LEFT  . vein closure procedure Left 2010    Family History  Problem Relation Age of Onset  . Heart disease Mother        MI  . Arthritis Mother   . Stroke Mother   . Hyperlipidemia Mother   . Hypertension Mother   . Varicose Veins Mother   . Heart disease Father        MI  . Hyperlipidemia Father   . Arthritis Father   . Cancer Father   . COPD Father   . Hearing loss Father   . Hypertension Father   . Vision loss Father   . Diabetes Sister   . COPD Sister   . Heart disease Sister   . Hyperlipidemia Sister   . Hypertension Sister   . Hyperlipidemia Brother   . COPD Brother   .  Diabetes Brother   . Heart disease Brother   . Hypertension Brother   . Arthritis Sister   . Hyperlipidemia Sister   . Hypertension Sister   . Vision loss Sister   . Varicose Veins Sister   . Arthritis Brother   . Varicose Veins Brother   . Arthritis Daughter   . Arthritis Daughter   . Diabetes Daughter   . Cancer Paternal Aunt   .  COPD Sister   . Heart disease Sister   . Hyperlipidemia Sister   . Hypertension Sister   . Varicose Veins Sister   . Heart disease Maternal Uncle   . Heart disease Paternal Uncle     Social History:  reports that she quit smoking about 30 years ago. Her smoking use included cigarettes. She has a 75.00 pack-year smoking history. She has never used smokeless tobacco. She reports previous alcohol use. She reports that she does not use drugs. Patient is a former 3 pack per day smoker for 25 years (75 pack year). Patient lives in [place]. She is a former Electrical engineer. Patient denies known exposures to radiation on toxins. The patient is accompanied by [name] by phone today.   Allergies:  Allergies  Allergen Reactions  . Oxycodone Other (See Comments)    Made her feel "shaky"  . Codeine Other (See Comments)    hallucinations    . Simvastatin Diarrhea and Nausea Only    Current Medications: Current Outpatient Medications  Medication Sig Dispense Refill  . acetaminophen (TYLENOL) 325 MG tablet Take 650 mg by mouth every 6 (six) hours as needed.    Marland Kitchen atorvastatin (LIPITOR) 40 MG tablet Take 1 tablet (40 mg total) by mouth daily. 90 tablet 4  . benazepril (LOTENSIN) 20 MG tablet Take 1 tablet (20 mg total) by mouth daily. 90 tablet 4  . ferrous sulfate 325 (65 FE) MG tablet Take 325 mg by mouth daily with breakfast.    . levothyroxine (SYNTHROID) 100 MCG tablet Take 1 tablet (100 mcg total) by mouth daily. 90 tablet 4  . meclizine (ANTIVERT) 25 MG tablet Take 1 tablet (25 mg total) by mouth 2 (two) times daily as needed for dizziness. 15 tablet 0  .  metoprolol tartrate (LOPRESSOR) 50 MG tablet TAKE 1 TABLET(50 MG) BY MOUTH TWICE DAILY 180 tablet 3  . traMADol (ULTRAM) 50 MG tablet Take 1 tablet (50 mg total) by mouth every 6 (six) hours as needed. 20 tablet 0  . triamcinolone cream (KENALOG) 0.1 % Apply 1 application topically 2 (two) times daily. 30 g 0  . diclofenac Sodium (VOLTAREN) 1 % GEL Apply 2 g topically 4 (four) times daily. 50 g 0  . diclofenac Sodium (VOLTAREN) 1 % GEL Apply topically as needed.     . ferrous sulfate (FERROUSUL) 325 (65 FE) MG tablet Take 1 tablet (325 mg total) by mouth 3 (three) times daily with meals for 14 days. (Patient taking differently: Take 325 mg by mouth daily with breakfast. ) 42 tablet 0  . Multiple Vitamin (MULTIVITAMIN WITH MINERALS) TABS tablet Take 1 tablet by mouth daily. (Patient not taking: Reported on 12/25/2019)    . ondansetron (ZOFRAN) 4 MG tablet Take 1 tablet (4 mg total) by mouth every 8 (eight) hours as needed for nausea or vomiting. 60 tablet 1   No current facility-administered medications for this visit.    Review of Systems  Constitutional: Negative for chills, diaphoresis, fever, malaise/fatigue and weight loss (stable).  HENT: Negative for congestion, ear discharge, ear pain, hearing loss, nosebleeds, sinus pain, sore throat and tinnitus.   Eyes: Negative for blurred vision and double vision.  Respiratory: Negative for cough, hemoptysis, sputum production and shortness of breath.        COPD  Cardiovascular: Negative for chest pain, palpitations and leg swelling.  Gastrointestinal: Negative for abdominal pain, blood in stool, constipation, diarrhea, heartburn, melena, nausea and vomiting.       Poor appetite.  Not drinking Boost and Ensure.  Genitourinary: Negative for dysuria, frequency and urgency.  Musculoskeletal: Positive for back pain (from shoulder blades to waist) and joint pain (ribs, sternum). Negative for falls, myalgias and neck pain.  Skin: Negative for itching and  rash.  Neurological: Negative for dizziness, tingling, sensory change, weakness and headaches.       Aneurysm, near syncope episode on 06/22/2019.  Endo/Heme/Allergies: Negative for environmental allergies. Does not bruise/bleed easily.       Hypothyroidism - on levothyroxine.   Psychiatric/Behavioral: Negative for depression and memory loss. The patient is not nervous/anxious and does not have insomnia.   All other systems reviewed and are negative.  Performance status (ECOG): 2  Vitals Blood pressure (!) 157/59, pulse 76, temperature 98.3 F (36.8 C), temperature source Tympanic, resp. rate 18, height '5\' 5"'  (1.651 m), weight 167 lb 5.3 oz (75.9 kg), SpO2 96 %.   Physical Exam Vitals and nursing note reviewed.  Constitutional:      General: She is not in acute distress.    Appearance: She is not diaphoretic.     Interventions: Face mask in place.     Comments: Fatigued appearing woman sitting comfortably in the exam room in no acute distress.  She has a rolling walker by her side.  HENT:     Head: Normocephalic and atraumatic.     Comments: Shoulder length brown hair.    Mouth/Throat:     Mouth: Mucous membranes are moist.     Pharynx: Oropharynx is clear.  Eyes:     General: No scleral icterus.    Extraocular Movements: Extraocular movements intact.     Conjunctiva/sclera: Conjunctivae normal.     Pupils: Pupils are equal, round, and reactive to light.     Comments: Blue eyes.  Cardiovascular:     Rate and Rhythm: Normal rate and regular rhythm.     Heart sounds: Normal heart sounds. No murmur heard.   Pulmonary:     Effort: Pulmonary effort is normal. No respiratory distress.     Breath sounds: Normal breath sounds. No wheezing or rales.  Chest:     Chest wall: No tenderness.  Abdominal:     General: Bowel sounds are normal. There is no distension.     Palpations: Abdomen is soft. There is no hepatomegaly, splenomegaly or mass.     Tenderness: There is no abdominal  tenderness. There is no guarding or rebound.  Musculoskeletal:        General: Tenderness (sternum) present. No swelling. Normal range of motion.     Cervical back: Normal range of motion and neck supple.  Lymphadenopathy:     Head:     Right side of head: No preauricular, posterior auricular or occipital adenopathy.     Left side of head: No preauricular, posterior auricular or occipital adenopathy.     Cervical: No cervical adenopathy.     Upper Body:     Right upper body: No supraclavicular or axillary adenopathy.     Left upper body: No supraclavicular or axillary adenopathy.     Lower Body: No right inguinal adenopathy. No left inguinal adenopathy.  Skin:    General: Skin is warm and dry.  Neurological:     Mental Status: She is alert and oriented to person, place, and time. Mental status is at baseline.  Psychiatric:        Behavior: Behavior normal.        Thought Content: Thought content normal.  Judgment: Judgment normal.    Appointment on 02/05/2020  Component Date Value Ref Range Status  . Sodium 02/05/2020 130* 135 - 145 mmol/L Final  . Potassium 02/05/2020 3.8  3.5 - 5.1 mmol/L Final  . Chloride 02/05/2020 99  98 - 111 mmol/L Final  . CO2 02/05/2020 23  22 - 32 mmol/L Final  . Glucose, Bld 02/05/2020 111* 70 - 99 mg/dL Final   Glucose reference range applies only to samples taken after fasting for at least 8 hours.  . BUN 02/05/2020 29* 8 - 23 mg/dL Final  . Creatinine, Ser 02/05/2020 1.42* 0.44 - 1.00 mg/dL Final  . Calcium 02/05/2020 9.9  8.9 - 10.3 mg/dL Final  . Total Protein 02/05/2020 9.8* 6.5 - 8.1 g/dL Final  . Albumin 02/05/2020 3.9  3.5 - 5.0 g/dL Final  . AST 02/05/2020 16  15 - 41 U/L Final  . ALT 02/05/2020 9  0 - 44 U/L Final  . Alkaline Phosphatase 02/05/2020 73  38 - 126 U/L Final  . Total Bilirubin 02/05/2020 0.2* 0.3 - 1.2 mg/dL Final  . GFR, Estimated 02/05/2020 35* >60 mL/min Final  . Anion gap 02/05/2020 8  5 - 15 Final   Performed at  [facility], [address] [place], [place] [address]  . WBC 02/05/2020 4.4  4.0 - 10.5 K/uL Final  . RBC 02/05/2020 2.69* 3.87 - 5.11 MIL/uL Final  . Hemoglobin 02/05/2020 9.0* 12.0 - 15.0 g/dL Final  . HCT 02/05/2020 29.0* 36 - 46 % Final  . MCV 02/05/2020 107.8* 80.0 - 100.0 fL Final  . MCH 02/05/2020 33.5  26.0 - 34.0 pg Final  . MCHC 02/05/2020 31.0  30.0 - 36.0 g/dL Final  . RDW 02/05/2020 15.2  11.5 - 15.5 % Final  . Platelets 02/05/2020 267  150 - 400 K/uL Final  . nRBC 02/05/2020 0.0  0.0 - 0.2 % Final  . Neutrophils Relative % 02/05/2020 60  % Final  . Neutro Abs 02/05/2020 2.7  1.7 - 7.7 K/uL Final  . Lymphocytes Relative 02/05/2020 25  % Final  . Lymphs Abs 02/05/2020 1.1  0.7 - 4.0 K/uL Final  . Monocytes Relative 02/05/2020 10  % Final  . Monocytes Absolute 02/05/2020 0.4  0.1 - 1.0 K/uL Final  . Eosinophils Relative 02/05/2020 3  % Final  . Eosinophils Absolute 02/05/2020 0.1  0.0 - 0.5 K/uL Final  . Basophils Relative 02/05/2020 1  % Final  . Basophils Absolute 02/05/2020 0.0  0.0 - 0.1 K/uL Final  . Immature Granulocytes 02/05/2020 1  % Final  . Abs Immature Granulocytes 02/05/2020 0.02  0.00 - 0.07 K/uL Final   Performed at [facility], [address], [place], [place] [address]    Assessment:  Bailey Ayala is a 79 y.o. female with active multiple myeloma. SPEPon 06/21/2017 revealed a 1.2 gm/dL monoclonal spike. Random urine revealed 4.6 mg/dL protein and a 40.6% M-spike. Calcium, albumin, and serum protein were normal.  Work-up on 03/08/2019revealed a hematocrit of 31.1, hemoglobin 10.4, MCV 97.4, platelets 227,000, WBC 5700 with an ANC of 3300. SPEPrevealed a 1.1 gm/dL IgG monoclonal protein with lambda light chain specificity and monoclonal free lambda light chains(Bence-Jones protein). IgG was 1660 ([phone]) and IgA was 48 (64-422). Kappa free light chains were 23.7, lambda free light chains were 142.2 and ratio 0.17  (0.26-1.65). Beta2-microglobulin was 4.7 (0.6-2.4). Normal studies included: B12, folate, and ferritin (75). TSHwas 0.204 (low). 24 hour UPEPrevealed kappa free light  chains 28.80 mg/L, lambda free light chains 63.80 mg/L and ration 0.45 (2.04 - 10.37). M spike was 27.4% (16 mg/24 hours).  Bone surveyon 06/30/2017 revealed no suspicious focal bony lesions.  Bone marrow aspirate and biopsyon 07/26/2017 revealed a slightly hypercellular marrow for age with trilineage hematopoiesis. There was 13% plasmacytosis(10-15% byCD138IHC). Plasma cells were ininterstitial cells and small clusters. Lambda light chains appeared restricted. Features were felt compatible with plasma cell neoplasm.  Bone marrow aspirate and biopsy on 05/29/2019 revealed a normocellularmarrow for age (20-30%) withincreased monoclonal plasma cells (15% aspirate, 20-30% CD138by IHC).Plasma cells were lambda restricted.There were no myelodysplastic changes.Storage iron was present. Flow cytometry revealed no monoclonal B-cell or phenotypically aberrant T-cell population. Findings were consistent with persistent plasma cell myeloma.Peripheral bloodrevealed amacrocytic anemia with rouleaux formation.Cytogenetics were normal (68, XX). FISH for MDSrevealed gain of KMT2A (MLL) or chromosome 11/11q (16F.5.0%, normal <3.1%).FISH for myeloma revealed a deletion of NF1 or chromosome 17q11 (2R1G, 89%, not seen in validation studies).  PET scanon 08/14/2017 revealed no hypermetabolic lesions of myeloma. There was a 3.6 cm infrarenal aortic aneurysm. Ultrasound in 2 years was recommended. She had a 4 mm LLL noduletoo small to characterize.  SPEPhas been followed: 1.1 on 06/30/2017, 1.2 on 11/14/2017, 1.3 on 01/26/2018, 1.2 on 05/10/2018, 1.4 on 08/08/2018, 1.2 on 11/09/2018, 1.4 on 02/11/2019, 1.6 on 05/09/2019, 1.6 on 08/07/2019, 1.8 on 10/21/2019, 2.0 on 11/13/2019,  2.1 on 12/11/2019, 2.6 on  01/08/2020, and 2.7 on 02/05/2020. IgGhas been followed: 1660 on 06/30/2017, 1684 on 11/14/2017, 1795 on 01/26/2018, and 2054 on 08/08/2018.  Lambda free light chainshave been followed:142.2 (ratio 0.17)on03/11/2017, 214.4(ratio 0.09)on01/16/2020, 209.3(ratio 0.08)on04/14/2020, 220.2(ratio 0.07)on07/17/2020, 228.9(ratio 0.08)on 02/11/2019,258.4 (ratio 0.07)on01/14/2021, 341.6 (ratio 0.05), 295.1 (ratio 0.05) on 10/21/2019, 477.1 (ratio 0.04) on 12/11/2019, and 577.9 (ratio 0.03) on 01/08/2020.  Chest CTon 01/24/2018 revealed persistent LLL nodule. Area calcified and felt to be consistent with a granuloma. There were no other suspicious pulmonary nodules. There was new Schmorl's node formation involving the inferior endplate of G53. There were stable small scattered osseous lesions related to underlying multiple myeloma.  PET scanon 05/15/2018 revealed nohypermetabolic lesions characteristic of active myeloma. There was stable hypermetabolic apical wall of the left ventricle, there waspotentially a small pseudoaneurysmin this vicinity, but no appreciable mass was noted on 01/24/2018. There was aninfrarenal abdominal aortic aneurysm, 3.6 cm in diameter. Follow-up ultrasound in 2 years was recommended. Other imaging findings of potential clinical significance: aortic atherosclerosis and coronary atherosclerosis.  Bone survery on01/25/2021 showed numerous new lytic lesions throughout the skeleton including the posterior calvarium, bilateral humeri, bilateral iliac bones, left inferior pubic ramus, bilateral femurs.  PET scan on 11/06/2019 revealed innumerable new lytic expansile lesions in the skeleton. Many are small and not appreciably hypermetabolic, but numerous hypermetabolic lesions are also identified especially in the sternum, clavicles, ribs, thoracolumbar spine, bony pelvis, and femurs. Index lesions with maximum SUV up to 17.2. Appearance was compatible with  progressive myeloma. There was increase in size of infrarenal abdominal aortic aneurysm, currently 3.9 cm in diameter. Other imaging findings of potential clinical significance: aortic atherosclerosis, coronary atherosclerosis, lingular scarring, postoperative findings in the lumbar spine with likely chronic fracture the right L3 pedicle screw in the mid pedicle region, bilateral crossover toe, and multiple joint prostheses.  Chest CT without contrast on 01/10/2020 revealed numerous lytic bone lesions consistent with widespread multiple myeloma, significantly progressed when compared to the chest CT dated 01/24/2018. There was a pathologic fracture of the mid sternal body, also new since the 2019 CT.  There was mild loss of the T8 vertebral body height without a defined fracture. There were no acute findings the lungs.   She has stage III chronic kidney disease(Cr 2.25 on 05/27/2019 and 1.38 on 11/13/2019).   She has a macrocytic anemia. B12, folate, and TSH were normal on 05/10/2018. Retic was 1.6%. Ferritin and iron studies were normal on 05/10/2018. Appetiteremains poor. Last colonoscopywas >10 years ago (she declines repeat).  She has a 75 pack year smoking history. She has COPD. Chest CT without contraston 07/25/2017 revealed no suspicious pulmonary nodules.  Sheunderwent total right hip replacementon 07/02/2019. She required 2 units of PRBCs.   Symptomatically, she has back and sternum pain.  She has taken a whole Tramadol pill and "didn't feel right".  She has not tried 1/2 pill for pain relief.  She enrolled in Hospice on 01/21/2020.  Plan: 1.   Labs today:  CBC with diff, CMP, SPEP. 2.   Multiple myeloma  Symptomatically, pain is modestly controlled.  She has progressive disease with increased bone pain and hypercalcemia.   She declined treatment.  She has enrolled in Hospice M spikewas 2.7 gm/dL today (increased). Bone surveyon 05/20/2019 revealednumerous new lytic  lesions throughout the skeleton. PET scan on 11/06/2019 revealed innumerable bone lesions Chest CT on 01/10/2020 was reviewed.    She continues to have progressive disease.   Bone marrow aspirateon 05/29/2019 revealed increased monoclonal plasma cells (20-30% CD138IHC). Patient wishes ongoing follow-up on a monthly basis.   Continue to monitor.                    3. Bone metastasis PET scan on 11/06/2019 confirmed lytic lesion in the sternum. She has back and sternal pain.. Pain is predominantly managed by Tylenol.   Encourage trying 1/2 tab tramadol. 4. Renal insufficiency Creatinine 1.42(CrCl 33.3 ml/min) today. Continue to monitor. 5. Macrocytic anemia Hematocrit 27.7. Hemoglobin 8.6. MCV 108.6 on 01/08/2020.  Hematocrit 29.0.  Hemoglobin 9.0.  MCV 107.8 on 02/05/2020. B12was 911 and folate 60.9. on 05/16/2019. Bone marrow on 02/03/2021revealed no evidence of MDS.  Patient declines transfusion. 6.History of hypercalcemia Calcium 9.9 today.  Patient declines future treatment for hypercalcemia.  Continue to monitor. 7.   Cancer-related pain  Pain is being controlled by Tylenol.  Encourage use of 1/2 tablet tramadol as needed for severe pain. 8.   Palliative care  Patient wishes a comfort care measures only.  Patient has enrolled in Hospice.  Code status is DNR/DNI.  Patient wishes to continue follow-up on an every month basis.  Several questions asked and answered. 9.   Nutrition  Patient to try Ensure clear. 10.   RTC monthly x 3 for labs (CBC with diff, BMP, SPEP). 11.   RTC for MD visit prn.  I discussed the assessment and treatment plan with the patient.  The patient was provided an opportunity to ask questions and all were answered.  The patient agreed with the plan and demonstrated an understanding of the instructions.  The patient was advised to call back if the symptoms worsen or  if the condition fails to improve as anticipated.  I provided 19 minutes of face-to-face time during this this encounter and > 50% was spent counseling as documented under my assessment and plan.  An additional 14 minutes were spent reviewing her chart (Epic and Care Everywhere) including notes, labs, and imaging studies.    Lequita Asal, MD, PhD    12/11/2019, 4:35 PM   I, Mirian Mo  Tufford, am acting as a Education administrator for [place]. Mike Gip, MD.   I, Camaria Gerald C. Mike Gip, MD, have reviewed the above documentation for accuracy and completeness, and I agree with the above.

## 2020-02-05 ENCOUNTER — Inpatient Hospital Stay: Payer: Medicare Other | Attending: Hematology and Oncology | Admitting: Hematology and Oncology

## 2020-02-05 ENCOUNTER — Other Ambulatory Visit: Payer: Self-pay

## 2020-02-05 ENCOUNTER — Inpatient Hospital Stay: Payer: Medicare Other

## 2020-02-05 ENCOUNTER — Encounter: Payer: Self-pay | Admitting: Hematology and Oncology

## 2020-02-05 VITALS — BP 157/59 | HR 76 | Temp 98.3°F | Resp 18 | Ht 65.0 in | Wt 167.3 lb

## 2020-02-05 DIAGNOSIS — D539 Nutritional anemia, unspecified: Secondary | ICD-10-CM

## 2020-02-05 DIAGNOSIS — G893 Neoplasm related pain (acute) (chronic): Secondary | ICD-10-CM | POA: Insufficient documentation

## 2020-02-05 DIAGNOSIS — C9000 Multiple myeloma not having achieved remission: Secondary | ICD-10-CM | POA: Insufficient documentation

## 2020-02-05 DIAGNOSIS — Z7189 Other specified counseling: Secondary | ICD-10-CM | POA: Diagnosis not present

## 2020-02-05 DIAGNOSIS — Z791 Long term (current) use of non-steroidal anti-inflammatories (NSAID): Secondary | ICD-10-CM | POA: Insufficient documentation

## 2020-02-05 DIAGNOSIS — M1990 Unspecified osteoarthritis, unspecified site: Secondary | ICD-10-CM | POA: Diagnosis not present

## 2020-02-05 DIAGNOSIS — I700 Atherosclerosis of aorta: Secondary | ICD-10-CM | POA: Diagnosis not present

## 2020-02-05 DIAGNOSIS — N183 Chronic kidney disease, stage 3 unspecified: Secondary | ICD-10-CM | POA: Insufficient documentation

## 2020-02-05 DIAGNOSIS — I130 Hypertensive heart and chronic kidney disease with heart failure and stage 1 through stage 4 chronic kidney disease, or unspecified chronic kidney disease: Secondary | ICD-10-CM | POA: Diagnosis not present

## 2020-02-05 DIAGNOSIS — I509 Heart failure, unspecified: Secondary | ICD-10-CM | POA: Insufficient documentation

## 2020-02-05 DIAGNOSIS — J449 Chronic obstructive pulmonary disease, unspecified: Secondary | ICD-10-CM | POA: Insufficient documentation

## 2020-02-05 DIAGNOSIS — E1122 Type 2 diabetes mellitus with diabetic chronic kidney disease: Secondary | ICD-10-CM | POA: Insufficient documentation

## 2020-02-05 DIAGNOSIS — I714 Abdominal aortic aneurysm, without rupture: Secondary | ICD-10-CM | POA: Insufficient documentation

## 2020-02-05 DIAGNOSIS — Z66 Do not resuscitate: Secondary | ICD-10-CM | POA: Insufficient documentation

## 2020-02-05 DIAGNOSIS — E785 Hyperlipidemia, unspecified: Secondary | ICD-10-CM | POA: Insufficient documentation

## 2020-02-05 DIAGNOSIS — Z79899 Other long term (current) drug therapy: Secondary | ICD-10-CM | POA: Insufficient documentation

## 2020-02-05 DIAGNOSIS — C7951 Secondary malignant neoplasm of bone: Secondary | ICD-10-CM | POA: Diagnosis not present

## 2020-02-05 DIAGNOSIS — I2510 Atherosclerotic heart disease of native coronary artery without angina pectoris: Secondary | ICD-10-CM | POA: Diagnosis not present

## 2020-02-05 DIAGNOSIS — E039 Hypothyroidism, unspecified: Secondary | ICD-10-CM | POA: Diagnosis not present

## 2020-02-05 DIAGNOSIS — Z8673 Personal history of transient ischemic attack (TIA), and cerebral infarction without residual deficits: Secondary | ICD-10-CM | POA: Insufficient documentation

## 2020-02-05 DIAGNOSIS — I779 Disorder of arteries and arterioles, unspecified: Secondary | ICD-10-CM

## 2020-02-05 DIAGNOSIS — Z87891 Personal history of nicotine dependence: Secondary | ICD-10-CM | POA: Insufficient documentation

## 2020-02-05 LAB — CBC WITH DIFFERENTIAL/PLATELET
Abs Immature Granulocytes: 0.02 10*3/uL (ref 0.00–0.07)
Basophils Absolute: 0 10*3/uL (ref 0.0–0.1)
Basophils Relative: 1 %
Eosinophils Absolute: 0.1 10*3/uL (ref 0.0–0.5)
Eosinophils Relative: 3 %
HCT: 29 % — ABNORMAL LOW (ref 36.0–46.0)
Hemoglobin: 9 g/dL — ABNORMAL LOW (ref 12.0–15.0)
Immature Granulocytes: 1 %
Lymphocytes Relative: 25 %
Lymphs Abs: 1.1 10*3/uL (ref 0.7–4.0)
MCH: 33.5 pg (ref 26.0–34.0)
MCHC: 31 g/dL (ref 30.0–36.0)
MCV: 107.8 fL — ABNORMAL HIGH (ref 80.0–100.0)
Monocytes Absolute: 0.4 10*3/uL (ref 0.1–1.0)
Monocytes Relative: 10 %
Neutro Abs: 2.7 10*3/uL (ref 1.7–7.7)
Neutrophils Relative %: 60 %
Platelets: 267 10*3/uL (ref 150–400)
RBC: 2.69 MIL/uL — ABNORMAL LOW (ref 3.87–5.11)
RDW: 15.2 % (ref 11.5–15.5)
WBC: 4.4 10*3/uL (ref 4.0–10.5)
nRBC: 0 % (ref 0.0–0.2)

## 2020-02-05 LAB — COMPREHENSIVE METABOLIC PANEL
ALT: 9 U/L (ref 0–44)
AST: 16 U/L (ref 15–41)
Albumin: 3.9 g/dL (ref 3.5–5.0)
Alkaline Phosphatase: 73 U/L (ref 38–126)
Anion gap: 8 (ref 5–15)
BUN: 29 mg/dL — ABNORMAL HIGH (ref 8–23)
CO2: 23 mmol/L (ref 22–32)
Calcium: 9.9 mg/dL (ref 8.9–10.3)
Chloride: 99 mmol/L (ref 98–111)
Creatinine, Ser: 1.42 mg/dL — ABNORMAL HIGH (ref 0.44–1.00)
GFR, Estimated: 35 mL/min — ABNORMAL LOW (ref >60)
Glucose, Bld: 111 mg/dL — ABNORMAL HIGH (ref 70–99)
Potassium: 3.8 mmol/L (ref 3.5–5.1)
Sodium: 130 mmol/L — ABNORMAL LOW (ref 135–145)
Total Bilirubin: 0.2 mg/dL — ABNORMAL LOW (ref 0.3–1.2)
Total Protein: 9.8 g/dL — ABNORMAL HIGH (ref 6.5–8.1)

## 2020-02-05 NOTE — Patient Instructions (Signed)
  Try Ensure Clear.

## 2020-02-05 NOTE — Progress Notes (Signed)
The patient c/o pain noted from her shoulders to her waist. Pain level 7-8

## 2020-02-07 LAB — PROTEIN ELECTROPHORESIS, SERUM
A/G Ratio: 0.8 (ref 0.7–1.7)
Albumin ELP: 3.9 g/dL (ref 2.9–4.4)
Alpha-1-Globulin: 0.3 g/dL (ref 0.0–0.4)
Alpha-2-Globulin: 1 g/dL (ref 0.4–1.0)
Beta Globulin: 0.9 g/dL (ref 0.7–1.3)
Gamma Globulin: 2.8 g/dL — ABNORMAL HIGH (ref 0.4–1.8)
Globulin, Total: 5.1 g/dL — ABNORMAL HIGH (ref 2.2–3.9)
M-Spike, %: 2.7 g/dL — ABNORMAL HIGH
Total Protein ELP: 9 g/dL — ABNORMAL HIGH (ref 6.0–8.5)

## 2020-02-11 DIAGNOSIS — M8458XD Pathological fracture in neoplastic disease, other specified site, subsequent encounter for fracture with routine healing: Secondary | ICD-10-CM | POA: Diagnosis not present

## 2020-02-11 DIAGNOSIS — C7989 Secondary malignant neoplasm of other specified sites: Secondary | ICD-10-CM | POA: Diagnosis not present

## 2020-02-11 DIAGNOSIS — C7951 Secondary malignant neoplasm of bone: Secondary | ICD-10-CM | POA: Diagnosis not present

## 2020-02-11 DIAGNOSIS — C9000 Multiple myeloma not having achieved remission: Secondary | ICD-10-CM | POA: Diagnosis not present

## 2020-02-11 DIAGNOSIS — I714 Abdominal aortic aneurysm, without rupture: Secondary | ICD-10-CM | POA: Diagnosis not present

## 2020-02-11 DIAGNOSIS — I2510 Atherosclerotic heart disease of native coronary artery without angina pectoris: Secondary | ICD-10-CM | POA: Diagnosis not present

## 2020-02-12 DIAGNOSIS — C9000 Multiple myeloma not having achieved remission: Secondary | ICD-10-CM | POA: Diagnosis not present

## 2020-02-12 DIAGNOSIS — M8458XD Pathological fracture in neoplastic disease, other specified site, subsequent encounter for fracture with routine healing: Secondary | ICD-10-CM | POA: Diagnosis not present

## 2020-02-12 DIAGNOSIS — I2510 Atherosclerotic heart disease of native coronary artery without angina pectoris: Secondary | ICD-10-CM | POA: Diagnosis not present

## 2020-02-12 DIAGNOSIS — C7989 Secondary malignant neoplasm of other specified sites: Secondary | ICD-10-CM | POA: Diagnosis not present

## 2020-02-12 DIAGNOSIS — C7951 Secondary malignant neoplasm of bone: Secondary | ICD-10-CM | POA: Diagnosis not present

## 2020-02-12 DIAGNOSIS — I714 Abdominal aortic aneurysm, without rupture: Secondary | ICD-10-CM | POA: Diagnosis not present

## 2020-02-13 DIAGNOSIS — C7989 Secondary malignant neoplasm of other specified sites: Secondary | ICD-10-CM | POA: Diagnosis not present

## 2020-02-13 DIAGNOSIS — C7951 Secondary malignant neoplasm of bone: Secondary | ICD-10-CM | POA: Diagnosis not present

## 2020-02-13 DIAGNOSIS — M8458XD Pathological fracture in neoplastic disease, other specified site, subsequent encounter for fracture with routine healing: Secondary | ICD-10-CM | POA: Diagnosis not present

## 2020-02-13 DIAGNOSIS — I2510 Atherosclerotic heart disease of native coronary artery without angina pectoris: Secondary | ICD-10-CM | POA: Diagnosis not present

## 2020-02-13 DIAGNOSIS — C9000 Multiple myeloma not having achieved remission: Secondary | ICD-10-CM | POA: Diagnosis not present

## 2020-02-13 DIAGNOSIS — I714 Abdominal aortic aneurysm, without rupture: Secondary | ICD-10-CM | POA: Diagnosis not present

## 2020-02-21 DIAGNOSIS — M8458XD Pathological fracture in neoplastic disease, other specified site, subsequent encounter for fracture with routine healing: Secondary | ICD-10-CM | POA: Diagnosis not present

## 2020-02-21 DIAGNOSIS — C9000 Multiple myeloma not having achieved remission: Secondary | ICD-10-CM | POA: Diagnosis not present

## 2020-02-21 DIAGNOSIS — C7951 Secondary malignant neoplasm of bone: Secondary | ICD-10-CM | POA: Diagnosis not present

## 2020-02-21 DIAGNOSIS — I714 Abdominal aortic aneurysm, without rupture: Secondary | ICD-10-CM | POA: Diagnosis not present

## 2020-02-21 DIAGNOSIS — C7989 Secondary malignant neoplasm of other specified sites: Secondary | ICD-10-CM | POA: Diagnosis not present

## 2020-02-21 DIAGNOSIS — I2510 Atherosclerotic heart disease of native coronary artery without angina pectoris: Secondary | ICD-10-CM | POA: Diagnosis not present

## 2020-02-24 DIAGNOSIS — E785 Hyperlipidemia, unspecified: Secondary | ICD-10-CM | POA: Diagnosis not present

## 2020-02-24 DIAGNOSIS — D509 Iron deficiency anemia, unspecified: Secondary | ICD-10-CM | POA: Diagnosis not present

## 2020-02-24 DIAGNOSIS — E039 Hypothyroidism, unspecified: Secondary | ICD-10-CM | POA: Diagnosis not present

## 2020-02-24 DIAGNOSIS — N183 Chronic kidney disease, stage 3 unspecified: Secondary | ICD-10-CM | POA: Diagnosis not present

## 2020-02-24 DIAGNOSIS — J449 Chronic obstructive pulmonary disease, unspecified: Secondary | ICD-10-CM | POA: Diagnosis not present

## 2020-02-24 DIAGNOSIS — K219 Gastro-esophageal reflux disease without esophagitis: Secondary | ICD-10-CM | POA: Diagnosis not present

## 2020-02-24 DIAGNOSIS — C7951 Secondary malignant neoplasm of bone: Secondary | ICD-10-CM | POA: Diagnosis not present

## 2020-02-24 DIAGNOSIS — Z6827 Body mass index (BMI) 27.0-27.9, adult: Secondary | ICD-10-CM | POA: Diagnosis not present

## 2020-02-24 DIAGNOSIS — C7989 Secondary malignant neoplasm of other specified sites: Secondary | ICD-10-CM | POA: Diagnosis not present

## 2020-02-24 DIAGNOSIS — I252 Old myocardial infarction: Secondary | ICD-10-CM | POA: Diagnosis not present

## 2020-02-24 DIAGNOSIS — Z8673 Personal history of transient ischemic attack (TIA), and cerebral infarction without residual deficits: Secondary | ICD-10-CM | POA: Diagnosis not present

## 2020-02-24 DIAGNOSIS — I739 Peripheral vascular disease, unspecified: Secondary | ICD-10-CM | POA: Diagnosis not present

## 2020-02-24 DIAGNOSIS — K529 Noninfective gastroenteritis and colitis, unspecified: Secondary | ICD-10-CM | POA: Diagnosis not present

## 2020-02-24 DIAGNOSIS — R634 Abnormal weight loss: Secondary | ICD-10-CM | POA: Diagnosis not present

## 2020-02-24 DIAGNOSIS — I714 Abdominal aortic aneurysm, without rupture: Secondary | ICD-10-CM | POA: Diagnosis not present

## 2020-02-24 DIAGNOSIS — M8458XD Pathological fracture in neoplastic disease, other specified site, subsequent encounter for fracture with routine healing: Secondary | ICD-10-CM | POA: Diagnosis not present

## 2020-02-24 DIAGNOSIS — Z87891 Personal history of nicotine dependence: Secondary | ICD-10-CM | POA: Diagnosis not present

## 2020-02-24 DIAGNOSIS — I509 Heart failure, unspecified: Secondary | ICD-10-CM | POA: Diagnosis not present

## 2020-02-24 DIAGNOSIS — C9000 Multiple myeloma not having achieved remission: Secondary | ICD-10-CM | POA: Diagnosis not present

## 2020-02-24 DIAGNOSIS — R42 Dizziness and giddiness: Secondary | ICD-10-CM | POA: Diagnosis not present

## 2020-02-24 DIAGNOSIS — I2510 Atherosclerotic heart disease of native coronary artery without angina pectoris: Secondary | ICD-10-CM | POA: Diagnosis not present

## 2020-02-24 DIAGNOSIS — I130 Hypertensive heart and chronic kidney disease with heart failure and stage 1 through stage 4 chronic kidney disease, or unspecified chronic kidney disease: Secondary | ICD-10-CM | POA: Diagnosis not present

## 2020-02-25 ENCOUNTER — Other Ambulatory Visit: Payer: Self-pay | Admitting: Hematology and Oncology

## 2020-02-25 ENCOUNTER — Encounter: Payer: Self-pay | Admitting: Hematology and Oncology

## 2020-02-25 MED ORDER — TRAMADOL HCL 50 MG PO TABS: 50.0000 mg | ORAL_TABLET | Freq: Four times a day (QID) | ORAL | 0 refills | Status: AC | PRN

## 2020-02-28 DIAGNOSIS — M8458XD Pathological fracture in neoplastic disease, other specified site, subsequent encounter for fracture with routine healing: Secondary | ICD-10-CM | POA: Diagnosis not present

## 2020-02-28 DIAGNOSIS — C7989 Secondary malignant neoplasm of other specified sites: Secondary | ICD-10-CM | POA: Diagnosis not present

## 2020-02-28 DIAGNOSIS — C9000 Multiple myeloma not having achieved remission: Secondary | ICD-10-CM | POA: Diagnosis not present

## 2020-02-28 DIAGNOSIS — I714 Abdominal aortic aneurysm, without rupture: Secondary | ICD-10-CM | POA: Diagnosis not present

## 2020-02-28 DIAGNOSIS — I2510 Atherosclerotic heart disease of native coronary artery without angina pectoris: Secondary | ICD-10-CM | POA: Diagnosis not present

## 2020-02-28 DIAGNOSIS — C7951 Secondary malignant neoplasm of bone: Secondary | ICD-10-CM | POA: Diagnosis not present

## 2020-03-05 DIAGNOSIS — I714 Abdominal aortic aneurysm, without rupture: Secondary | ICD-10-CM | POA: Diagnosis not present

## 2020-03-05 DIAGNOSIS — C7951 Secondary malignant neoplasm of bone: Secondary | ICD-10-CM | POA: Diagnosis not present

## 2020-03-05 DIAGNOSIS — I2510 Atherosclerotic heart disease of native coronary artery without angina pectoris: Secondary | ICD-10-CM | POA: Diagnosis not present

## 2020-03-05 DIAGNOSIS — M8458XD Pathological fracture in neoplastic disease, other specified site, subsequent encounter for fracture with routine healing: Secondary | ICD-10-CM | POA: Diagnosis not present

## 2020-03-05 DIAGNOSIS — C9000 Multiple myeloma not having achieved remission: Secondary | ICD-10-CM | POA: Diagnosis not present

## 2020-03-05 DIAGNOSIS — C7989 Secondary malignant neoplasm of other specified sites: Secondary | ICD-10-CM | POA: Diagnosis not present

## 2020-03-09 ENCOUNTER — Inpatient Hospital Stay: Attending: Hematology and Oncology

## 2020-03-09 ENCOUNTER — Other Ambulatory Visit: Payer: Self-pay

## 2020-03-09 DIAGNOSIS — C9000 Multiple myeloma not having achieved remission: Secondary | ICD-10-CM | POA: Insufficient documentation

## 2020-03-09 DIAGNOSIS — C7951 Secondary malignant neoplasm of bone: Secondary | ICD-10-CM

## 2020-03-09 DIAGNOSIS — D539 Nutritional anemia, unspecified: Secondary | ICD-10-CM

## 2020-03-09 LAB — CBC WITH DIFFERENTIAL/PLATELET
Abs Immature Granulocytes: 0.01 10*3/uL (ref 0.00–0.07)
Basophils Absolute: 0 10*3/uL (ref 0.0–0.1)
Basophils Relative: 0 %
Eosinophils Absolute: 0.1 10*3/uL (ref 0.0–0.5)
Eosinophils Relative: 2 %
HCT: 30.3 % — ABNORMAL LOW (ref 36.0–46.0)
Hemoglobin: 9.5 g/dL — ABNORMAL LOW (ref 12.0–15.0)
Immature Granulocytes: 0 %
Lymphocytes Relative: 31 %
Lymphs Abs: 1.2 10*3/uL (ref 0.7–4.0)
MCH: 33.8 pg (ref 26.0–34.0)
MCHC: 31.4 g/dL (ref 30.0–36.0)
MCV: 107.8 fL — ABNORMAL HIGH (ref 80.0–100.0)
Monocytes Absolute: 0.5 10*3/uL (ref 0.1–1.0)
Monocytes Relative: 12 %
Neutro Abs: 2.1 10*3/uL (ref 1.7–7.7)
Neutrophils Relative %: 55 %
Platelets: 277 10*3/uL (ref 150–400)
RBC: 2.81 MIL/uL — ABNORMAL LOW (ref 3.87–5.11)
RDW: 14.9 % (ref 11.5–15.5)
WBC: 3.9 10*3/uL — ABNORMAL LOW (ref 4.0–10.5)
nRBC: 0 % (ref 0.0–0.2)

## 2020-03-09 LAB — BASIC METABOLIC PANEL
Anion gap: 7 (ref 5–15)
BUN: 17 mg/dL (ref 8–23)
CO2: 26 mmol/L (ref 22–32)
Calcium: 9.6 mg/dL (ref 8.9–10.3)
Chloride: 101 mmol/L (ref 98–111)
Creatinine, Ser: 1.22 mg/dL — ABNORMAL HIGH (ref 0.44–1.00)
GFR, Estimated: 45 mL/min — ABNORMAL LOW (ref >60)
Glucose, Bld: 96 mg/dL (ref 70–99)
Potassium: 3.6 mmol/L (ref 3.5–5.1)
Sodium: 134 mmol/L — ABNORMAL LOW (ref 135–145)

## 2020-03-10 ENCOUNTER — Telehealth: Payer: Self-pay

## 2020-03-10 LAB — PROTEIN ELECTROPHORESIS, SERUM
A/G Ratio: 0.8 (ref 0.7–1.7)
Albumin ELP: 4.2 g/dL (ref 2.9–4.4)
Alpha-1-Globulin: 0.3 g/dL (ref 0.0–0.4)
Alpha-2-Globulin: 1.1 g/dL — ABNORMAL HIGH (ref 0.4–1.0)
Beta Globulin: 1.1 g/dL (ref 0.7–1.3)
Gamma Globulin: 3.1 g/dL — ABNORMAL HIGH (ref 0.4–1.8)
Globulin, Total: 5.5 g/dL — ABNORMAL HIGH (ref 2.2–3.9)
M-Spike, %: 2.9 g/dL — ABNORMAL HIGH
Total Protein ELP: 9.7 g/dL — ABNORMAL HIGH (ref 6.0–8.5)

## 2020-03-10 NOTE — Telephone Encounter (Signed)
Left message for patient to call back  

## 2020-03-10 NOTE — Telephone Encounter (Signed)
-----   Message from Lequita Asal, MD sent at 03/10/2020  4:44 PM EST ----- Regarding: Please call patient with M-spike  ----- Message ----- From: Interface, Lab In [place] Sent: 03/09/2020   3:54 PM EST To: Lequita Asal, MD

## 2020-03-11 ENCOUNTER — Telehealth: Payer: Self-pay

## 2020-03-11 NOTE — Telephone Encounter (Signed)
-----   Message from Lequita Asal, MD sent at 03/10/2020  4:44 PM EST ----- Regarding: Please call patient with M-spike  ----- Message ----- From: Interface, Lab In [place] Sent: 03/09/2020   3:54 PM EST To: Lequita Asal, MD

## 2020-03-11 NOTE — Telephone Encounter (Signed)
Patient aware.

## 2020-03-13 DIAGNOSIS — I714 Abdominal aortic aneurysm, without rupture: Secondary | ICD-10-CM | POA: Diagnosis not present

## 2020-03-13 DIAGNOSIS — I2510 Atherosclerotic heart disease of native coronary artery without angina pectoris: Secondary | ICD-10-CM | POA: Diagnosis not present

## 2020-03-13 DIAGNOSIS — C7951 Secondary malignant neoplasm of bone: Secondary | ICD-10-CM | POA: Diagnosis not present

## 2020-03-13 DIAGNOSIS — C7989 Secondary malignant neoplasm of other specified sites: Secondary | ICD-10-CM | POA: Diagnosis not present

## 2020-03-13 DIAGNOSIS — M8458XD Pathological fracture in neoplastic disease, other specified site, subsequent encounter for fracture with routine healing: Secondary | ICD-10-CM | POA: Diagnosis not present

## 2020-03-13 DIAGNOSIS — C9000 Multiple myeloma not having achieved remission: Secondary | ICD-10-CM | POA: Diagnosis not present

## 2020-03-17 DIAGNOSIS — M8458XD Pathological fracture in neoplastic disease, other specified site, subsequent encounter for fracture with routine healing: Secondary | ICD-10-CM | POA: Diagnosis not present

## 2020-03-17 DIAGNOSIS — I714 Abdominal aortic aneurysm, without rupture: Secondary | ICD-10-CM | POA: Diagnosis not present

## 2020-03-17 DIAGNOSIS — I2510 Atherosclerotic heart disease of native coronary artery without angina pectoris: Secondary | ICD-10-CM | POA: Diagnosis not present

## 2020-03-17 DIAGNOSIS — C7989 Secondary malignant neoplasm of other specified sites: Secondary | ICD-10-CM | POA: Diagnosis not present

## 2020-03-17 DIAGNOSIS — C7951 Secondary malignant neoplasm of bone: Secondary | ICD-10-CM | POA: Diagnosis not present

## 2020-03-17 DIAGNOSIS — C9000 Multiple myeloma not having achieved remission: Secondary | ICD-10-CM | POA: Diagnosis not present

## 2020-03-18 DIAGNOSIS — C7989 Secondary malignant neoplasm of other specified sites: Secondary | ICD-10-CM | POA: Diagnosis not present

## 2020-03-18 DIAGNOSIS — I714 Abdominal aortic aneurysm, without rupture: Secondary | ICD-10-CM | POA: Diagnosis not present

## 2020-03-18 DIAGNOSIS — M8458XD Pathological fracture in neoplastic disease, other specified site, subsequent encounter for fracture with routine healing: Secondary | ICD-10-CM | POA: Diagnosis not present

## 2020-03-18 DIAGNOSIS — C9000 Multiple myeloma not having achieved remission: Secondary | ICD-10-CM | POA: Diagnosis not present

## 2020-03-18 DIAGNOSIS — I2510 Atherosclerotic heart disease of native coronary artery without angina pectoris: Secondary | ICD-10-CM | POA: Diagnosis not present

## 2020-03-18 DIAGNOSIS — C7951 Secondary malignant neoplasm of bone: Secondary | ICD-10-CM | POA: Diagnosis not present

## 2020-03-25 DIAGNOSIS — R634 Abnormal weight loss: Secondary | ICD-10-CM | POA: Diagnosis not present

## 2020-03-25 DIAGNOSIS — I509 Heart failure, unspecified: Secondary | ICD-10-CM | POA: Diagnosis not present

## 2020-03-25 DIAGNOSIS — I130 Hypertensive heart and chronic kidney disease with heart failure and stage 1 through stage 4 chronic kidney disease, or unspecified chronic kidney disease: Secondary | ICD-10-CM | POA: Diagnosis not present

## 2020-03-25 DIAGNOSIS — I714 Abdominal aortic aneurysm, without rupture: Secondary | ICD-10-CM | POA: Diagnosis not present

## 2020-03-25 DIAGNOSIS — Z8673 Personal history of transient ischemic attack (TIA), and cerebral infarction without residual deficits: Secondary | ICD-10-CM | POA: Diagnosis not present

## 2020-03-25 DIAGNOSIS — I739 Peripheral vascular disease, unspecified: Secondary | ICD-10-CM | POA: Diagnosis not present

## 2020-03-25 DIAGNOSIS — I2510 Atherosclerotic heart disease of native coronary artery without angina pectoris: Secondary | ICD-10-CM | POA: Diagnosis not present

## 2020-03-25 DIAGNOSIS — Z6827 Body mass index (BMI) 27.0-27.9, adult: Secondary | ICD-10-CM | POA: Diagnosis not present

## 2020-03-25 DIAGNOSIS — C7951 Secondary malignant neoplasm of bone: Secondary | ICD-10-CM | POA: Diagnosis not present

## 2020-03-25 DIAGNOSIS — E785 Hyperlipidemia, unspecified: Secondary | ICD-10-CM | POA: Diagnosis not present

## 2020-03-25 DIAGNOSIS — K529 Noninfective gastroenteritis and colitis, unspecified: Secondary | ICD-10-CM | POA: Diagnosis not present

## 2020-03-25 DIAGNOSIS — M8458XD Pathological fracture in neoplastic disease, other specified site, subsequent encounter for fracture with routine healing: Secondary | ICD-10-CM | POA: Diagnosis not present

## 2020-03-25 DIAGNOSIS — E039 Hypothyroidism, unspecified: Secondary | ICD-10-CM | POA: Diagnosis not present

## 2020-03-25 DIAGNOSIS — C7989 Secondary malignant neoplasm of other specified sites: Secondary | ICD-10-CM | POA: Diagnosis not present

## 2020-03-25 DIAGNOSIS — J449 Chronic obstructive pulmonary disease, unspecified: Secondary | ICD-10-CM | POA: Diagnosis not present

## 2020-03-25 DIAGNOSIS — C9000 Multiple myeloma not having achieved remission: Secondary | ICD-10-CM | POA: Diagnosis not present

## 2020-03-25 DIAGNOSIS — R42 Dizziness and giddiness: Secondary | ICD-10-CM | POA: Diagnosis not present

## 2020-03-25 DIAGNOSIS — D509 Iron deficiency anemia, unspecified: Secondary | ICD-10-CM | POA: Diagnosis not present

## 2020-03-25 DIAGNOSIS — I252 Old myocardial infarction: Secondary | ICD-10-CM | POA: Diagnosis not present

## 2020-03-25 DIAGNOSIS — K219 Gastro-esophageal reflux disease without esophagitis: Secondary | ICD-10-CM | POA: Diagnosis not present

## 2020-03-25 DIAGNOSIS — N183 Chronic kidney disease, stage 3 unspecified: Secondary | ICD-10-CM | POA: Diagnosis not present

## 2020-03-25 DIAGNOSIS — Z87891 Personal history of nicotine dependence: Secondary | ICD-10-CM | POA: Diagnosis not present

## 2020-03-26 DIAGNOSIS — C9000 Multiple myeloma not having achieved remission: Secondary | ICD-10-CM | POA: Diagnosis not present

## 2020-03-26 DIAGNOSIS — I2510 Atherosclerotic heart disease of native coronary artery without angina pectoris: Secondary | ICD-10-CM | POA: Diagnosis not present

## 2020-03-26 DIAGNOSIS — C7989 Secondary malignant neoplasm of other specified sites: Secondary | ICD-10-CM | POA: Diagnosis not present

## 2020-03-26 DIAGNOSIS — I714 Abdominal aortic aneurysm, without rupture: Secondary | ICD-10-CM | POA: Diagnosis not present

## 2020-03-26 DIAGNOSIS — M8458XD Pathological fracture in neoplastic disease, other specified site, subsequent encounter for fracture with routine healing: Secondary | ICD-10-CM | POA: Diagnosis not present

## 2020-03-26 DIAGNOSIS — C7951 Secondary malignant neoplasm of bone: Secondary | ICD-10-CM | POA: Diagnosis not present

## 2020-03-30 ENCOUNTER — Ambulatory Visit (INDEPENDENT_AMBULATORY_CARE_PROVIDER_SITE_OTHER): Payer: Medicare Other | Admitting: Podiatry

## 2020-03-30 ENCOUNTER — Other Ambulatory Visit: Payer: Self-pay

## 2020-03-30 ENCOUNTER — Encounter: Payer: Self-pay | Admitting: Podiatry

## 2020-03-30 DIAGNOSIS — M79674 Pain in right toe(s): Secondary | ICD-10-CM

## 2020-03-30 DIAGNOSIS — B351 Tinea unguium: Secondary | ICD-10-CM

## 2020-03-30 DIAGNOSIS — N1832 Chronic kidney disease, stage 3b: Secondary | ICD-10-CM

## 2020-03-30 DIAGNOSIS — M79675 Pain in left toe(s): Secondary | ICD-10-CM

## 2020-03-30 DIAGNOSIS — N289 Disorder of kidney and ureter, unspecified: Secondary | ICD-10-CM

## 2020-03-30 NOTE — Progress Notes (Signed)
This patient returns to my office for at risk foot care.  This patient requires this care by a professional since this patient will be at risk due to having PAD and chronic kidney disease.  This patient is unable to cut nails herself since the patient cannot reach her nails.These nails are painful walking and wearing shoes.  This patient presents for at risk foot care today.  General Appearance  Alert, conversant and in no acute stress.  Vascular  Dorsalis pedis and posterior tibial  pulses are palpable  bilaterally.  Capillary return is within normal limits  bilaterally. Temperature is within normal limits  bilaterally.  Neurologic  Senn-Weinstein monofilament wire test diminished   bilaterally. Muscle power within normal limits bilaterally.  Nails Thick disfigured discolored nails with subungual debris  from hallux to fifth toes bilaterally. No evidence of bacterial infection or drainage bilaterally.  Orthopedic  No limitations of motion  feet .  No crepitus or effusions noted.  No bony pathology or digital deformities noted.  HAV with overlapping second digits  B/L.  PTTD and DJD right rearfoot. Contracted digits  B/L.  Skin  normotropic skin with no porokeratosis noted bilaterally.  No signs of infections or ulcers noted.     Onychomycosis  Pain in right toes  Pain in left toes  Consent was obtained for treatment procedures.   Mechanical debridement of nails 1-5  bilaterally performed with a nail nipper.  Filed with dremel without incident. Patient requests a f/u appointment so that her insurance covers her visit.   Return office visit   10  weeks                    Told patient to return for periodic foot care and evaluation due to potential at risk complications.   Gardiner Barefoot DPM

## 2020-04-02 ENCOUNTER — Telehealth: Payer: Self-pay | Admitting: Nurse Practitioner

## 2020-04-02 ENCOUNTER — Other Ambulatory Visit: Payer: Self-pay

## 2020-04-02 NOTE — Telephone Encounter (Signed)
Copied from [place] [id number]. Topic: Medicare AWV >> Apr 02, 2020  4:19 PM Cher Nakai R wrote: Reason for CRM:  Left message for patient to call back and schedule the Medicare Annual Wellness Visit (AWV) virtually.  Last AWV 02/06/2019  Please schedule at anytime with [facility] Advisor.  45 minute appointment  Any questions, please call me at [phone]

## 2020-04-03 DIAGNOSIS — I714 Abdominal aortic aneurysm, without rupture: Secondary | ICD-10-CM | POA: Diagnosis not present

## 2020-04-03 DIAGNOSIS — C9000 Multiple myeloma not having achieved remission: Secondary | ICD-10-CM | POA: Diagnosis not present

## 2020-04-03 DIAGNOSIS — M8458XD Pathological fracture in neoplastic disease, other specified site, subsequent encounter for fracture with routine healing: Secondary | ICD-10-CM | POA: Diagnosis not present

## 2020-04-03 DIAGNOSIS — C7951 Secondary malignant neoplasm of bone: Secondary | ICD-10-CM | POA: Diagnosis not present

## 2020-04-03 DIAGNOSIS — C7989 Secondary malignant neoplasm of other specified sites: Secondary | ICD-10-CM | POA: Diagnosis not present

## 2020-04-03 DIAGNOSIS — I2510 Atherosclerotic heart disease of native coronary artery without angina pectoris: Secondary | ICD-10-CM | POA: Diagnosis not present

## 2020-04-06 ENCOUNTER — Inpatient Hospital Stay: Attending: Hematology and Oncology

## 2020-04-06 ENCOUNTER — Other Ambulatory Visit: Payer: Self-pay

## 2020-04-06 DIAGNOSIS — D539 Nutritional anemia, unspecified: Secondary | ICD-10-CM

## 2020-04-06 DIAGNOSIS — C7951 Secondary malignant neoplasm of bone: Secondary | ICD-10-CM | POA: Diagnosis not present

## 2020-04-06 DIAGNOSIS — C9000 Multiple myeloma not having achieved remission: Secondary | ICD-10-CM | POA: Diagnosis present

## 2020-04-06 LAB — BASIC METABOLIC PANEL
Anion gap: 12 (ref 5–15)
BUN: 22 mg/dL (ref 8–23)
CO2: 25 mmol/L (ref 22–32)
Calcium: 9 mg/dL (ref 8.9–10.3)
Chloride: 99 mmol/L (ref 98–111)
Creatinine, Ser: 1.2 mg/dL — ABNORMAL HIGH (ref 0.44–1.00)
GFR, Estimated: 46 mL/min — ABNORMAL LOW (ref >60)
Glucose, Bld: 88 mg/dL (ref 70–99)
Potassium: 3.8 mmol/L (ref 3.5–5.1)
Sodium: 136 mmol/L (ref 135–145)

## 2020-04-06 LAB — CBC WITH DIFFERENTIAL/PLATELET
Abs Immature Granulocytes: 0.01 10*3/uL (ref 0.00–0.07)
Basophils Absolute: 0 10*3/uL (ref 0.0–0.1)
Basophils Relative: 1 %
Eosinophils Absolute: 0.1 10*3/uL (ref 0.0–0.5)
Eosinophils Relative: 2 %
HCT: 30.5 % — ABNORMAL LOW (ref 36.0–46.0)
Hemoglobin: 9.6 g/dL — ABNORMAL LOW (ref 12.0–15.0)
Immature Granulocytes: 0 %
Lymphocytes Relative: 27 %
Lymphs Abs: 1.2 10*3/uL (ref 0.7–4.0)
MCH: 34.2 pg — ABNORMAL HIGH (ref 26.0–34.0)
MCHC: 31.5 g/dL (ref 30.0–36.0)
MCV: 108.5 fL — ABNORMAL HIGH (ref 80.0–100.0)
Monocytes Absolute: 0.5 10*3/uL (ref 0.1–1.0)
Monocytes Relative: 11 %
Neutro Abs: 2.6 10*3/uL (ref 1.7–7.7)
Neutrophils Relative %: 59 %
Platelets: 260 10*3/uL (ref 150–400)
RBC: 2.81 MIL/uL — ABNORMAL LOW (ref 3.87–5.11)
RDW: 14 % (ref 11.5–15.5)
WBC: 4.4 10*3/uL (ref 4.0–10.5)
nRBC: 0 % (ref 0.0–0.2)

## 2020-04-07 DIAGNOSIS — Z23 Encounter for immunization: Secondary | ICD-10-CM | POA: Diagnosis not present

## 2020-04-07 LAB — PROTEIN ELECTROPHORESIS, SERUM
A/G Ratio: 0.8 (ref 0.7–1.7)
Albumin ELP: 4.1 g/dL (ref 2.9–4.4)
Alpha-1-Globulin: 0.3 g/dL (ref 0.0–0.4)
Alpha-2-Globulin: 1 g/dL (ref 0.4–1.0)
Beta Globulin: 1 g/dL (ref 0.7–1.3)
Gamma Globulin: 3.1 g/dL — ABNORMAL HIGH (ref 0.4–1.8)
Globulin, Total: 5.4 g/dL — ABNORMAL HIGH (ref 2.2–3.9)
M-Spike, %: 3 g/dL — ABNORMAL HIGH
Total Protein ELP: 9.5 g/dL — ABNORMAL HIGH (ref 6.0–8.5)

## 2020-04-08 ENCOUNTER — Other Ambulatory Visit: Payer: Self-pay | Admitting: Hospice and Palliative Medicine

## 2020-04-08 ENCOUNTER — Ambulatory Visit
Admission: RE | Admit: 2020-04-08 | Discharge: 2020-04-08 | Disposition: A | Discharge status: Home / Self Care | Source: Ambulatory Visit | Attending: Hospice and Palliative Medicine | Admitting: Hospice and Palliative Medicine

## 2020-04-08 DIAGNOSIS — C9000 Multiple myeloma not having achieved remission: Secondary | ICD-10-CM

## 2020-04-08 DIAGNOSIS — I714 Abdominal aortic aneurysm, without rupture: Secondary | ICD-10-CM | POA: Diagnosis not present

## 2020-04-08 DIAGNOSIS — C7951 Secondary malignant neoplasm of bone: Secondary | ICD-10-CM | POA: Diagnosis not present

## 2020-04-08 DIAGNOSIS — C7989 Secondary malignant neoplasm of other specified sites: Secondary | ICD-10-CM | POA: Diagnosis not present

## 2020-04-08 DIAGNOSIS — M8458XD Pathological fracture in neoplastic disease, other specified site, subsequent encounter for fracture with routine healing: Secondary | ICD-10-CM | POA: Diagnosis not present

## 2020-04-08 DIAGNOSIS — I2510 Atherosclerotic heart disease of native coronary artery without angina pectoris: Secondary | ICD-10-CM | POA: Diagnosis not present

## 2020-04-08 MED ORDER — MORPHINE SULFATE (CONCENTRATE) 10 MG /0.5 ML PO SOLN
5.0000 mg | ORAL | 0 refills | Status: AC | PRN
Start: 2020-04-08 — End: ?

## 2020-04-08 NOTE — Progress Notes (Signed)
I received a call from patient's hospice nurse. Apparently, patient was lifting a bucket of paint with her left arm and she heard a pop and experienced severe pain at her left clavicle. She called 911 and paramedics placed her arm in a sling but patient refused ER. She wants imaging through hospice. Pain is being managed with morphine via the comfort kit. Hospice requests Rx for morphine elixir.   Suspect pathologic fracture from MM. Patient did have previous known lesions B. Clavicles on PET.   Plan: -XR L. Clavicle  -Morphine elixir 5-74m Q2H PRN for pain #374m

## 2020-04-09 DIAGNOSIS — M8458XD Pathological fracture in neoplastic disease, other specified site, subsequent encounter for fracture with routine healing: Secondary | ICD-10-CM | POA: Diagnosis not present

## 2020-04-09 DIAGNOSIS — I714 Abdominal aortic aneurysm, without rupture: Secondary | ICD-10-CM | POA: Diagnosis not present

## 2020-04-09 DIAGNOSIS — C9000 Multiple myeloma not having achieved remission: Secondary | ICD-10-CM | POA: Diagnosis not present

## 2020-04-09 DIAGNOSIS — C7951 Secondary malignant neoplasm of bone: Secondary | ICD-10-CM | POA: Diagnosis not present

## 2020-04-09 DIAGNOSIS — C7989 Secondary malignant neoplasm of other specified sites: Secondary | ICD-10-CM | POA: Diagnosis not present

## 2020-04-09 DIAGNOSIS — I2510 Atherosclerotic heart disease of native coronary artery without angina pectoris: Secondary | ICD-10-CM | POA: Diagnosis not present

## 2020-04-09 NOTE — Progress Notes (Signed)
  Thanks for the update.  M 

## 2020-04-10 ENCOUNTER — Telehealth: Payer: Self-pay

## 2020-04-10 NOTE — Telephone Encounter (Signed)
Copied from [employer] [facility] [employer] [phone]. Topic: Appointment Scheduling - Scheduling Inquiry for Clinic >> Apr 10, 2020  1:18 PM Alanda Slim E wrote: Reason for CRM: Pt has an appt scheduled for 12.22.21/ Pt is at the end stages of her Myeloma and is on morphine / pt is not able to travel to come in office / please advise asap if a virtual is ok >> Apr 10, 2020  1:25 PM Alanda Slim E wrote: Pt doent have access to take vital but she has a hospice nurse coming to her next week and if she does so before the appt they will have vital or she can report them after/ Pt has video capability with her daughter laura's phone    Can apt be virtual?

## 2020-04-10 NOTE — Telephone Encounter (Signed)
Please let her know I am here for her family and her.  I am okay with cancelling this appointment as hospice is following her closely.

## 2020-04-10 NOTE — Telephone Encounter (Signed)
Please advise is virtual visit okay?

## 2020-04-11 DIAGNOSIS — I2510 Atherosclerotic heart disease of native coronary artery without angina pectoris: Secondary | ICD-10-CM | POA: Diagnosis not present

## 2020-04-11 DIAGNOSIS — C7951 Secondary malignant neoplasm of bone: Secondary | ICD-10-CM | POA: Diagnosis not present

## 2020-04-11 DIAGNOSIS — M8458XD Pathological fracture in neoplastic disease, other specified site, subsequent encounter for fracture with routine healing: Secondary | ICD-10-CM | POA: Diagnosis not present

## 2020-04-11 DIAGNOSIS — I714 Abdominal aortic aneurysm, without rupture: Secondary | ICD-10-CM | POA: Diagnosis not present

## 2020-04-11 DIAGNOSIS — C7989 Secondary malignant neoplasm of other specified sites: Secondary | ICD-10-CM | POA: Diagnosis not present

## 2020-04-11 DIAGNOSIS — C9000 Multiple myeloma not having achieved remission: Secondary | ICD-10-CM | POA: Diagnosis not present

## 2020-04-12 DIAGNOSIS — I714 Abdominal aortic aneurysm, without rupture: Secondary | ICD-10-CM | POA: Diagnosis not present

## 2020-04-12 DIAGNOSIS — I2510 Atherosclerotic heart disease of native coronary artery without angina pectoris: Secondary | ICD-10-CM | POA: Diagnosis not present

## 2020-04-12 DIAGNOSIS — M8458XD Pathological fracture in neoplastic disease, other specified site, subsequent encounter for fracture with routine healing: Secondary | ICD-10-CM | POA: Diagnosis not present

## 2020-04-12 DIAGNOSIS — C7989 Secondary malignant neoplasm of other specified sites: Secondary | ICD-10-CM | POA: Diagnosis not present

## 2020-04-12 DIAGNOSIS — C9000 Multiple myeloma not having achieved remission: Secondary | ICD-10-CM | POA: Diagnosis not present

## 2020-04-12 DIAGNOSIS — C7951 Secondary malignant neoplasm of bone: Secondary | ICD-10-CM | POA: Diagnosis not present

## 2020-04-13 DIAGNOSIS — C7951 Secondary malignant neoplasm of bone: Secondary | ICD-10-CM | POA: Diagnosis not present

## 2020-04-13 DIAGNOSIS — I2510 Atherosclerotic heart disease of native coronary artery without angina pectoris: Secondary | ICD-10-CM | POA: Diagnosis not present

## 2020-04-13 DIAGNOSIS — M8458XD Pathological fracture in neoplastic disease, other specified site, subsequent encounter for fracture with routine healing: Secondary | ICD-10-CM | POA: Diagnosis not present

## 2020-04-13 DIAGNOSIS — I714 Abdominal aortic aneurysm, without rupture: Secondary | ICD-10-CM | POA: Diagnosis not present

## 2020-04-13 DIAGNOSIS — C9000 Multiple myeloma not having achieved remission: Secondary | ICD-10-CM | POA: Diagnosis not present

## 2020-04-13 DIAGNOSIS — C7989 Secondary malignant neoplasm of other specified sites: Secondary | ICD-10-CM | POA: Diagnosis not present

## 2020-04-13 NOTE — Telephone Encounter (Signed)
Called patient daughter [name], no answer left voicemail regarding Jolene's most recent message. Will cancel appointment.

## 2020-04-14 DIAGNOSIS — M8458XD Pathological fracture in neoplastic disease, other specified site, subsequent encounter for fracture with routine healing: Secondary | ICD-10-CM | POA: Diagnosis not present

## 2020-04-14 DIAGNOSIS — I714 Abdominal aortic aneurysm, without rupture: Secondary | ICD-10-CM | POA: Diagnosis not present

## 2020-04-14 DIAGNOSIS — C7989 Secondary malignant neoplasm of other specified sites: Secondary | ICD-10-CM | POA: Diagnosis not present

## 2020-04-14 DIAGNOSIS — C7951 Secondary malignant neoplasm of bone: Secondary | ICD-10-CM | POA: Diagnosis not present

## 2020-04-14 DIAGNOSIS — I2510 Atherosclerotic heart disease of native coronary artery without angina pectoris: Secondary | ICD-10-CM | POA: Diagnosis not present

## 2020-04-14 DIAGNOSIS — C9000 Multiple myeloma not having achieved remission: Secondary | ICD-10-CM | POA: Diagnosis not present

## 2020-04-15 ENCOUNTER — Ambulatory Visit: Payer: Medicare Other | Admitting: Nurse Practitioner

## 2020-04-15 DIAGNOSIS — M8458XD Pathological fracture in neoplastic disease, other specified site, subsequent encounter for fracture with routine healing: Secondary | ICD-10-CM | POA: Diagnosis not present

## 2020-04-15 DIAGNOSIS — C9000 Multiple myeloma not having achieved remission: Secondary | ICD-10-CM | POA: Diagnosis not present

## 2020-04-15 DIAGNOSIS — I2510 Atherosclerotic heart disease of native coronary artery without angina pectoris: Secondary | ICD-10-CM | POA: Diagnosis not present

## 2020-04-15 DIAGNOSIS — I714 Abdominal aortic aneurysm, without rupture: Secondary | ICD-10-CM | POA: Diagnosis not present

## 2020-04-15 DIAGNOSIS — C7989 Secondary malignant neoplasm of other specified sites: Secondary | ICD-10-CM | POA: Diagnosis not present

## 2020-04-15 DIAGNOSIS — C7951 Secondary malignant neoplasm of bone: Secondary | ICD-10-CM | POA: Diagnosis not present

## 2020-04-16 DIAGNOSIS — I714 Abdominal aortic aneurysm, without rupture: Secondary | ICD-10-CM | POA: Diagnosis not present

## 2020-04-16 DIAGNOSIS — I2510 Atherosclerotic heart disease of native coronary artery without angina pectoris: Secondary | ICD-10-CM | POA: Diagnosis not present

## 2020-04-16 DIAGNOSIS — C7989 Secondary malignant neoplasm of other specified sites: Secondary | ICD-10-CM | POA: Diagnosis not present

## 2020-04-16 DIAGNOSIS — C7951 Secondary malignant neoplasm of bone: Secondary | ICD-10-CM | POA: Diagnosis not present

## 2020-04-16 DIAGNOSIS — M8458XD Pathological fracture in neoplastic disease, other specified site, subsequent encounter for fracture with routine healing: Secondary | ICD-10-CM | POA: Diagnosis not present

## 2020-04-16 DIAGNOSIS — C9000 Multiple myeloma not having achieved remission: Secondary | ICD-10-CM | POA: Diagnosis not present

## 2020-04-17 DIAGNOSIS — M8458XD Pathological fracture in neoplastic disease, other specified site, subsequent encounter for fracture with routine healing: Secondary | ICD-10-CM | POA: Diagnosis not present

## 2020-04-17 DIAGNOSIS — I2510 Atherosclerotic heart disease of native coronary artery without angina pectoris: Secondary | ICD-10-CM | POA: Diagnosis not present

## 2020-04-17 DIAGNOSIS — C7989 Secondary malignant neoplasm of other specified sites: Secondary | ICD-10-CM | POA: Diagnosis not present

## 2020-04-17 DIAGNOSIS — C7951 Secondary malignant neoplasm of bone: Secondary | ICD-10-CM | POA: Diagnosis not present

## 2020-04-17 DIAGNOSIS — C9000 Multiple myeloma not having achieved remission: Secondary | ICD-10-CM | POA: Diagnosis not present

## 2020-04-17 DIAGNOSIS — I714 Abdominal aortic aneurysm, without rupture: Secondary | ICD-10-CM | POA: Diagnosis not present

## 2020-04-19 DIAGNOSIS — C7951 Secondary malignant neoplasm of bone: Secondary | ICD-10-CM | POA: Diagnosis not present

## 2020-04-19 DIAGNOSIS — I714 Abdominal aortic aneurysm, without rupture: Secondary | ICD-10-CM | POA: Diagnosis not present

## 2020-04-19 DIAGNOSIS — C7989 Secondary malignant neoplasm of other specified sites: Secondary | ICD-10-CM | POA: Diagnosis not present

## 2020-04-19 DIAGNOSIS — M8458XD Pathological fracture in neoplastic disease, other specified site, subsequent encounter for fracture with routine healing: Secondary | ICD-10-CM | POA: Diagnosis not present

## 2020-04-19 DIAGNOSIS — I2510 Atherosclerotic heart disease of native coronary artery without angina pectoris: Secondary | ICD-10-CM | POA: Diagnosis not present

## 2020-04-19 DIAGNOSIS — C9000 Multiple myeloma not having achieved remission: Secondary | ICD-10-CM | POA: Diagnosis not present

## 2020-04-25 DEATH — deceased

## 2020-05-04 ENCOUNTER — Inpatient Hospital Stay: Payer: Medicare Other

## 2020-05-05 ENCOUNTER — Ambulatory Visit: Payer: Medicare Other | Admitting: Cardiovascular Disease

## 2020-07-02 ENCOUNTER — Ambulatory Visit: Payer: No Typology Code available for payment source | Admitting: Podiatry

## 2021-12-13 IMAGING — CR DG BONE SURVEY MET
1 series · 9 of 10 positions shown · non-contrast
Comparison: Bone survey 06/30/2017

CLINICAL DATA: Multiple myeloma

EXAM:
METASTATIC BONE SURVEY

[Series 1: dg bone survey met · 0.14mm/px · 9 of 25 slices shown]
[im 1/25]
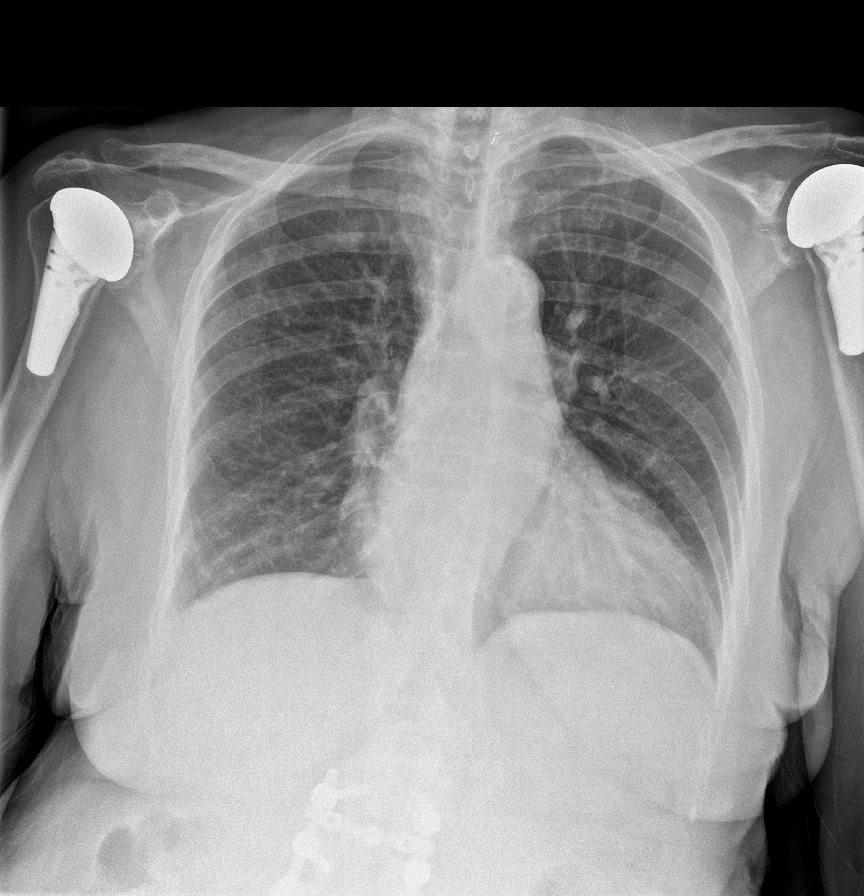
[im 3/25]
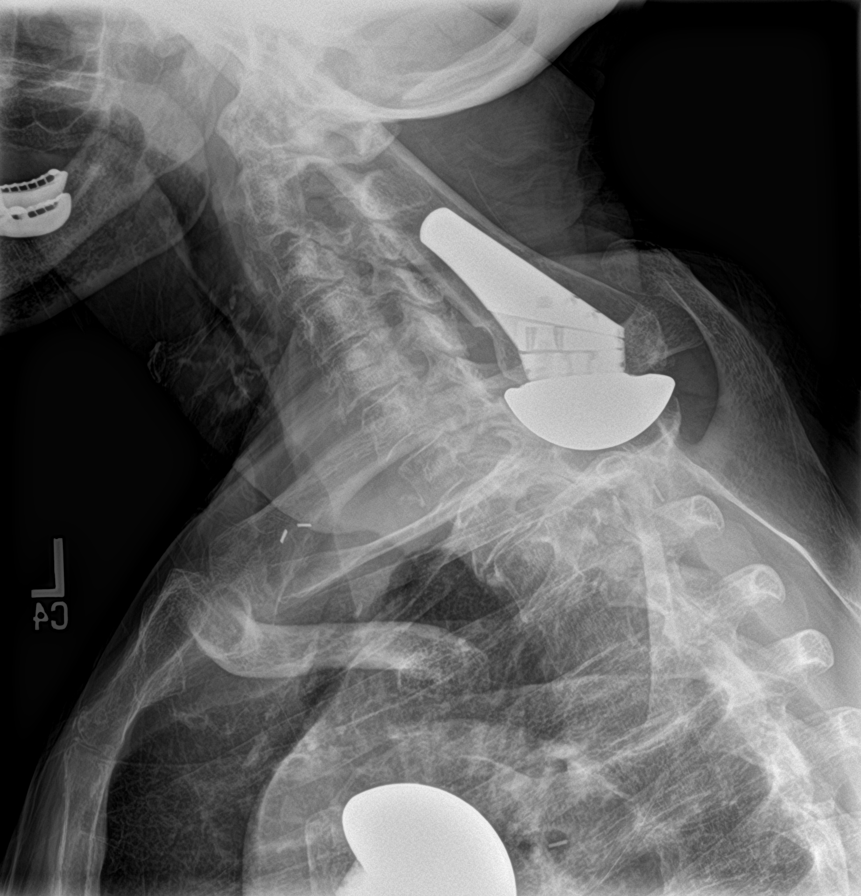
[im 6/25]
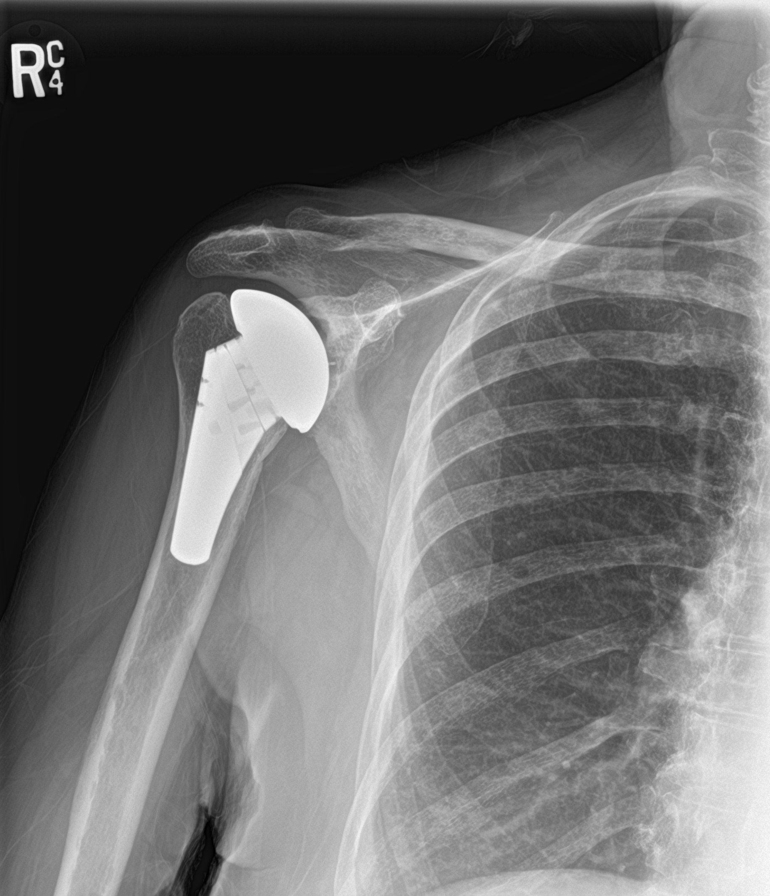
[im 9/25]
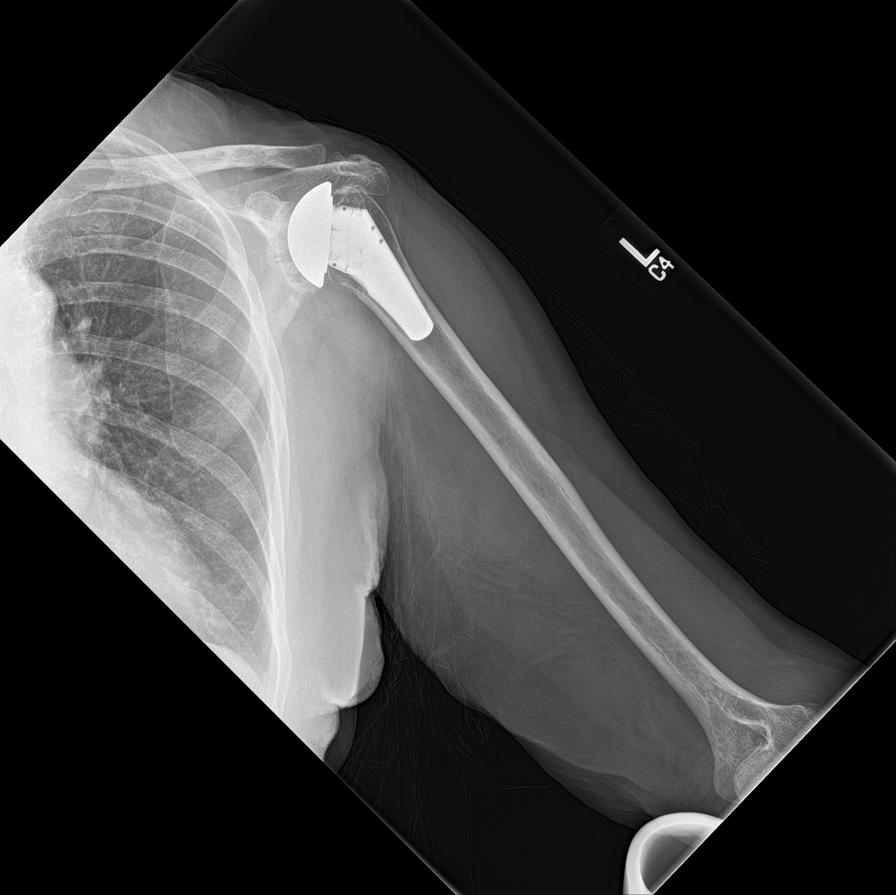
[im 11/25]
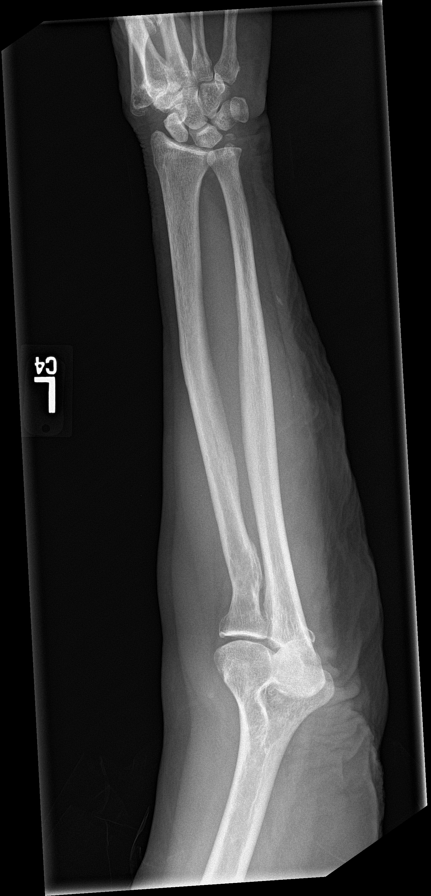
[im 14/25]
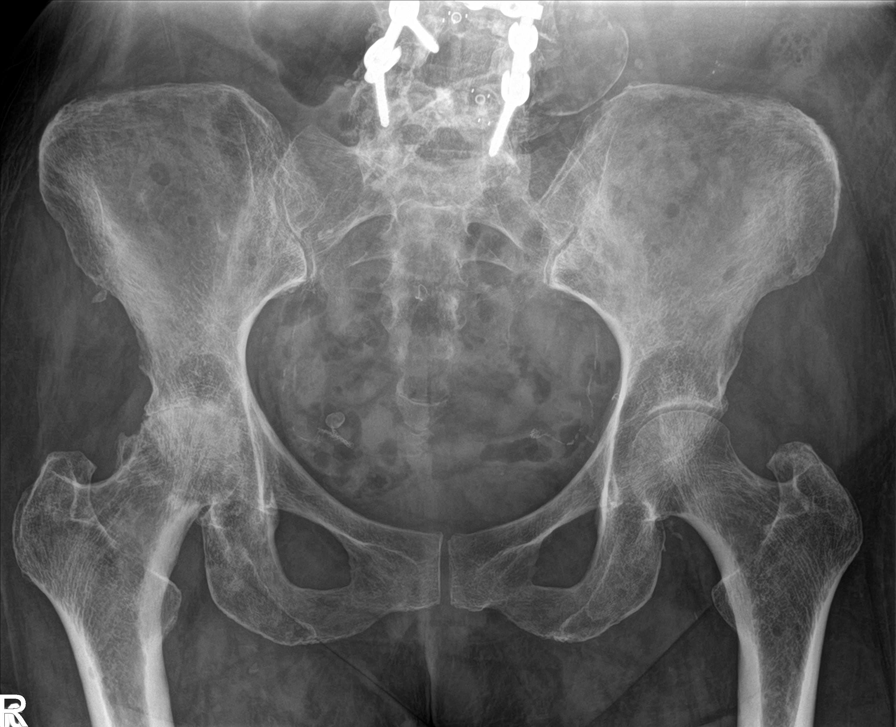
[im 17/25]
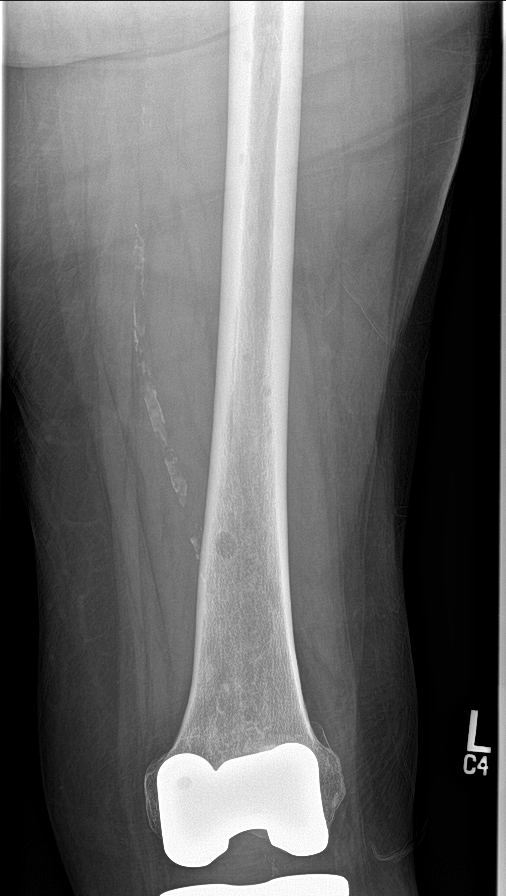
[im 19/25]
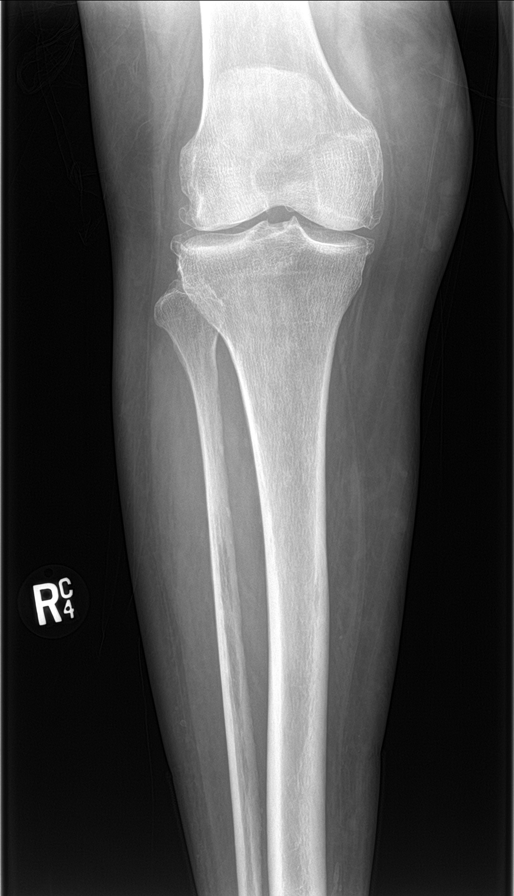
[im 22/25]
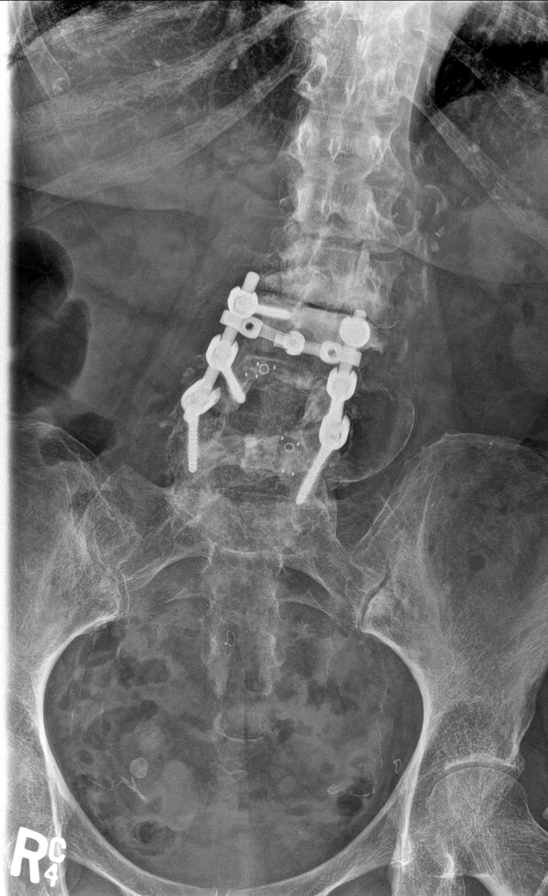

[9 of 10 positions shown; findings below may reference images not displayed]

FINDINGS: Heart is borderline in size. No confluent airspace opacities or
effusions. Atherosclerotic calcifications in the aorta.

Bilateral shoulder replacements. Diffuse degenerative disc disease
and facet disease in the cervical spine. Degenerative changes in the
thoracic spine and lumbar spine. Postoperative changes in the lumbar
spine from posterior fusion from L3-L5. One of the L3 screws is
fractured, stable.

Small lucent areas within the mid and distal aspect of the right
humerus as well as possibly left humerus, new since prior study.
Numerous rounded lucent lesions project over the iliac bones
bilaterally, also new.

Advanced degenerative changes in the right hip with moderate
degenerative changes in the left hip. Scattered lucent lesions
throughout the femurs bilaterally are new since prior study. Lucent
lesion within the left inferior pubic ramus, new. Prior left knee
replacement.

Small lucent area noted in the posterior calvarium is new since
prior study.
IMPRESSION: Numerous new lytic lesions throughout the skeleton including the
posterior calvarium, bilateral humeri, bilateral iliac bones, left
inferior pubic ramus, bilateral femurs. These are new since prior
study.
